# Patient Record
Sex: Male | Born: 1985
Health system: Southern US, Community
[De-identification: ages and names within clinical notes are randomized; demographics above are authoritative.]

## PROBLEM LIST (undated history)

## (undated) DIAGNOSIS — I10 Essential (primary) hypertension: Secondary | ICD-10-CM

## (undated) DIAGNOSIS — F319 Bipolar disorder, unspecified: Secondary | ICD-10-CM

## (undated) DIAGNOSIS — F209 Schizophrenia, unspecified: Secondary | ICD-10-CM

## (undated) DIAGNOSIS — E785 Hyperlipidemia, unspecified: Secondary | ICD-10-CM

## (undated) DIAGNOSIS — M549 Dorsalgia, unspecified: Secondary | ICD-10-CM

## (undated) HISTORY — DX: Hyperlipidemia, unspecified: E78.5

---

## 2007-12-30 ENCOUNTER — Inpatient Hospital Stay (HOSPITAL_COMMUNITY): Admission: EM | Admit: 2007-12-30 | Discharge: 2008-01-07 | Payer: Self-pay | Admitting: Psychiatry

## 2007-12-30 ENCOUNTER — Emergency Department (HOSPITAL_COMMUNITY): Admission: EM | Admit: 2007-12-30 | Discharge: 2007-12-30 | Payer: Self-pay | Admitting: Emergency Medicine

## 2007-12-31 ENCOUNTER — Ambulatory Visit: Payer: Self-pay | Admitting: Psychiatry

## 2009-05-16 ENCOUNTER — Other Ambulatory Visit: Payer: Self-pay | Admitting: Emergency Medicine

## 2009-05-16 ENCOUNTER — Ambulatory Visit: Payer: Self-pay | Admitting: Psychiatry

## 2009-05-16 ENCOUNTER — Inpatient Hospital Stay (HOSPITAL_COMMUNITY): Admission: RE | Admit: 2009-05-16 | Discharge: 2009-05-23 | Payer: Self-pay | Admitting: Psychiatry

## 2009-08-23 ENCOUNTER — Encounter: Admission: RE | Admit: 2009-08-23 | Discharge: 2009-10-02 | Payer: Self-pay | Admitting: Orthopaedic Surgery

## 2009-09-10 ENCOUNTER — Encounter: Admission: RE | Admit: 2009-09-10 | Discharge: 2009-10-02 | Payer: Self-pay | Admitting: *Deleted

## 2009-10-21 ENCOUNTER — Emergency Department (HOSPITAL_COMMUNITY): Admission: EM | Admit: 2009-10-21 | Discharge: 2009-10-21 | Payer: Self-pay | Admitting: Emergency Medicine

## 2010-05-28 ENCOUNTER — Emergency Department (HOSPITAL_COMMUNITY): Admission: EM | Admit: 2010-05-28 | Discharge: 2010-05-28 | Payer: Self-pay | Admitting: Emergency Medicine

## 2010-05-30 ENCOUNTER — Observation Stay (HOSPITAL_COMMUNITY): Admission: EM | Admit: 2010-05-30 | Discharge: 2010-06-04 | Payer: Self-pay | Admitting: Emergency Medicine

## 2010-05-31 ENCOUNTER — Ambulatory Visit: Payer: Self-pay | Admitting: Gastroenterology

## 2010-05-31 ENCOUNTER — Ambulatory Visit: Payer: Self-pay | Admitting: Hematology & Oncology

## 2010-05-31 DIAGNOSIS — F200 Paranoid schizophrenia: Secondary | ICD-10-CM

## 2010-06-03 ENCOUNTER — Encounter: Payer: Self-pay | Admitting: Internal Medicine

## 2010-06-03 ENCOUNTER — Ambulatory Visit: Payer: Self-pay | Admitting: Psychiatry

## 2010-06-04 ENCOUNTER — Inpatient Hospital Stay (HOSPITAL_COMMUNITY): Admission: EM | Admit: 2010-06-04 | Discharge: 2010-06-05 | Payer: Self-pay | Admitting: Psychiatry

## 2010-06-04 ENCOUNTER — Ambulatory Visit: Payer: Self-pay | Admitting: Psychiatry

## 2010-06-05 ENCOUNTER — Ambulatory Visit: Payer: Self-pay | Admitting: Psychiatry

## 2010-08-14 DIAGNOSIS — J309 Allergic rhinitis, unspecified: Secondary | ICD-10-CM | POA: Insufficient documentation

## 2010-09-17 DIAGNOSIS — F200 Paranoid schizophrenia: Secondary | ICD-10-CM | POA: Insufficient documentation

## 2011-01-21 NOTE — Procedures (Signed)
Summary: Upper Endoscopy  Patient: Lucas Knapp Note: All result statuses are Final unless otherwise noted.  Tests: (1) Upper Endoscopy (EGD)   EGD Upper Endoscopy       DONE     [facility]     [address] [place], [place]  [address]           ENDOSCOPY PROCEDURE REPORT           PATIENT:  Lucas Knapp, Lucas Knapp  MR#:  [id number]     BIRTHDATE:  1986-07-13, 23 yrs. old  GENDER:  male           ENDOSCOPIST:  Iva Boop, MD, [facility]     Referred by:  [facility],           PROCEDURE DATE:  06/03/2010     PROCEDURE:  EGD, diagnostic     ASA CLASS:  Class II     INDICATIONS:  iron deficiency anemia           MEDICATIONS:   Fentanyl 50 mcg IV, Versed 5 mg IV     TOPICAL ANESTHETIC:  Cetacaine Spray           DESCRIPTION OF PROCEDURE:   After the risks benefits and     alternatives of the procedure were thoroughly explained, informed     consent was obtained.  The  endoscope was introduced through the     mouth and advanced to the second portion of the duodenum, without     limitations.  The instrument was slowly withdrawn as the mucosa     was fully examined.     <<PROCEDUREIMAGES>>           The upper, middle, and distal third of the esophagus were     carefully inspected and no abnormalities were noted. The z-line     was well seen at the GEJ. The endoscope was pushed into the fundus     which was normal including a retroflexed view. The antrum,gastric     body, first and second part of the duodenum were unremarkable.     Retroflexed views revealed no abnormalities.    The scope was then     withdrawn from the patient and the procedure completed.           COMPLICATIONS:  None           ENDOSCOPIC IMPRESSION:     1) Normal EGD     RECOMMENDATIONS:     colonoscopy next           REPEAT EXAM:  In for as needed.           Iva Boop, MD, [facility]           n.     eSIGNED:   Iva Boop at 06/03/2010 12:56 PM           Ronnald Ramp,  [id number]  Note: An exclamation mark (!) indicates a result that was not dispersed into the flowsheet. Document Creation Date: 06/03/2010 1:08 PM _______________________________________________________________________  (1) Order result status: Final Collection or observation date-time: 06/03/2010 12:53 Requested date-time:  Receipt date-time:  Reported date-time:  Referring Physician:   Ordering Physician: Stan Head [phone]) Specimen Source:  Source: Launa Grill Order Number: [id number] Lab site:

## 2011-01-21 NOTE — Procedures (Signed)
Summary: Colonoscopy  Patient: [name] Note: All result statuses are Final unless otherwise noted.  Tests: (1) Colonoscopy (COL)   COL Colonoscopy           DONE     [facility]     [address] [place], [place]  [address]           COLONOSCOPY PROCEDURE REPORT           PATIENT:  Lucas Knapp, Lucas Knapp  MR#:  [id number]     BIRTHDATE:  03/27/1986, 23 yrs. old  GENDER:  male     ENDOSCOPIST:  Iva Boop, MD, [facility]     REF. BY:  [facility]     PROCEDURE DATE:  06/03/2010     PROCEDURE:  Colonoscopy 95[address]     ASA CLASS:  Class II     INDICATIONS:  Iron Deficiency Anemia     MEDICATIONS:   There was residual sedation effect present from     prior procedure., Versed 4 mg, Fentanyl 50 mcg IV           DESCRIPTION OF PROCEDURE:   After the risks benefits and     alternatives of the procedure were thoroughly explained, informed     consent was obtained.  Digital rectal exam was performed and     revealed prolasped hemorrhoids.   The  endoscope was introduced     through the anus and advanced to the terminal ileum which was     intubated for a short distance, without limitations.  The quality     of the prep was good, using MoviPrep.  The instrument was then     slowly withdrawn as the colon was fully examined. Insertion: 5     minutes Withdrwal: 6 minutes     <<PROCEDUREIMAGES>>           FINDINGS:  Internal hemorrhoids were found. Looks like chronically     inflamed and prolapsed internal hemorrhoid.  The terminal ileum     appeared normal.  A normal appearing cecum, ileocecal valve, and     appendiceal orifice were identified. The ascending, hepatic     flexure, transverse, splenic flexure, descending, and sigmoid     colon, appeared unremarkable.   Retroflexed views in the rectum     revealed internal hemorrhoids.    The scope was then withdrawn     from the patient and the procedure completed.           COMPLICATIONS:  None        ENDOSCOPIC IMPRESSION:     1) Prolapased internal hemorrhoid. May be cause of anemia     as it sounds like he has been having           chronic problems.     2) Otherwise normal colonoscopy  into terminal ileum. Good     prep.           RECOMMENDATIONS:     [facility] consult re: hemorrhoid     therapy (called)           REPEAT EXAM:  In for as needed.           Iva Boop, MD, [facility]           n.     eSIGNED:   Iva Boop at 06/03/2010 01:24 PM           Ronnald Ramp, [id number]  Note: An exclamation  mark (!) indicates a result that was not dispersed into the flowsheet. Document Creation Date: 06/03/2010 1:26 PM _______________________________________________________________________  (1) Order result status: Final Collection or observation date-time: 06/03/2010 13:15 Requested date-time:  Receipt date-time:  Reported date-time:  Referring Physician:   Ordering Physician: Stan Head [phone]) Specimen Source:  Source: Launa Grill Order Number: [id number] Lab site:

## 2011-03-10 LAB — CBC
HCT: 22.4 % — ABNORMAL LOW (ref 39.0–52.0)
HCT: 25 % — ABNORMAL LOW (ref 39.0–52.0)
Hemoglobin: 6.8 g/dL — CL (ref 13.0–17.0)
Hemoglobin: 7 g/dL — ABNORMAL LOW (ref 13.0–17.0)
Hemoglobin: 7.7 g/dL — ABNORMAL LOW (ref 13.0–17.0)
Hemoglobin: 8.2 g/dL — ABNORMAL LOW (ref 13.0–17.0)
MCHC: 30.6 g/dL (ref 30.0–36.0)
MCHC: 30.8 g/dL (ref 30.0–36.0)
MCHC: 31.2 g/dL (ref 30.0–36.0)
MCV: 70.8 fL — ABNORMAL LOW (ref 78.0–100.0)
MCV: 71.1 fL — ABNORMAL LOW (ref 78.0–100.0)
MCV: 71.5 fL — ABNORMAL LOW (ref 78.0–100.0)
MCV: 72.3 fL — ABNORMAL LOW (ref 78.0–100.0)
Platelets: 401 10*3/uL — ABNORMAL HIGH (ref 150–400)
RBC: 3.15 MIL/uL — ABNORMAL LOW (ref 4.22–5.81)
RBC: 3.17 MIL/uL — ABNORMAL LOW (ref 4.22–5.81)
RBC: 3.25 MIL/uL — ABNORMAL LOW (ref 4.22–5.81)
RBC: 3.47 MIL/uL — ABNORMAL LOW (ref 4.22–5.81)
RBC: 3.52 MIL/uL — ABNORMAL LOW (ref 4.22–5.81)
RBC: 3.79 MIL/uL — ABNORMAL LOW (ref 4.22–5.81)
RDW: 19.2 % — ABNORMAL HIGH (ref 11.5–15.5)
RDW: 19.4 % — ABNORMAL HIGH (ref 11.5–15.5)
RDW: 20.2 % — ABNORMAL HIGH (ref 11.5–15.5)
RDW: 20.3 % — ABNORMAL HIGH (ref 11.5–15.5)
WBC: 5.8 10*3/uL (ref 4.0–10.5)
WBC: 6.4 10*3/uL (ref 4.0–10.5)

## 2011-03-10 LAB — FOLATE

## 2011-03-10 LAB — URINALYSIS, ROUTINE W REFLEX MICROSCOPIC
Glucose, UA: NEGATIVE mg/dL
Leukocytes, UA: NEGATIVE
Protein, ur: 100 mg/dL — AB
Specific Gravity, Urine: 1.01 (ref 1.005–1.030)
pH: 7 (ref 5.0–8.0)

## 2011-03-10 LAB — COMPREHENSIVE METABOLIC PANEL
BUN: 8 mg/dL (ref 6–23)
BUN: 8 mg/dL (ref 6–23)
CO2: 23 mEq/L (ref 19–32)
Calcium: 9.6 mg/dL (ref 8.4–10.5)
Chloride: 109 mEq/L (ref 96–112)
Creatinine, Ser: 0.94 mg/dL (ref 0.4–1.5)
GFR calc non Af Amer: >60 mL/min (ref >60)
Glucose, Bld: 78 mg/dL (ref 70–99)
Glucose, Bld: 88 mg/dL (ref 70–99)
Sodium: 139 mEq/L (ref 135–145)
Total Bilirubin: 0.5 mg/dL (ref 0.3–1.2)
Total Protein: 7.8 g/dL (ref 6.0–8.3)

## 2011-03-10 LAB — UIFE/LIGHT CHAINS/TP QN, 24-HR UR
Alpha 2, Urine: DETECTED — AB
Total Protein, Urine: 47.3 mg/dL

## 2011-03-10 LAB — RAPID URINE DRUG SCREEN, HOSP PERFORMED
Cocaine: NOT DETECTED
Opiates: NOT DETECTED

## 2011-03-10 LAB — BASIC METABOLIC PANEL
BUN: 3 mg/dL — ABNORMAL LOW (ref 6–23)
Calcium: 9 mg/dL (ref 8.4–10.5)
GFR calc non Af Amer: >60 mL/min (ref >60)
Potassium: 4.5 mEq/L (ref 3.5–5.1)
Sodium: 142 mEq/L (ref 135–145)

## 2011-03-10 LAB — IRON AND TIBC

## 2011-03-10 LAB — TSH: TSH: 3.069 u[IU]/mL (ref 0.350–4.500)

## 2011-03-10 LAB — HEMOGLOBINOPATHY EVALUATION
Hgb F Quant: 0 % (ref 0.0–2.0)
Hgb S Quant: 0 % (ref 0.0–0.0)

## 2011-03-10 LAB — TECHNOLOGIST SMEAR REVIEW

## 2011-03-10 LAB — DIFFERENTIAL
Lymphocytes Relative: 34 % (ref 12–46)
Lymphs Abs: 2.6 10*3/uL (ref 0.7–4.0)
Neutrophils Relative %: 57 % (ref 43–77)

## 2011-03-10 LAB — LACTATE DEHYDROGENASE: LDH: 141 U/L (ref 94–250)

## 2011-03-10 LAB — URINE MICROSCOPIC-ADD ON

## 2011-03-10 LAB — PROTEIN ELECTROPHORESIS, SERUM
Albumin ELP: 62 % (ref 55.8–66.1)
Alpha-1-Globulin: 5.2 % — ABNORMAL HIGH (ref 2.9–4.9)
Beta 2: 5.7 % (ref 3.2–6.5)
Total Protein ELP: 7.5 g/dL (ref 6.0–8.3)

## 2011-04-01 LAB — BASIC METABOLIC PANEL
BUN: 10 mg/dL (ref 6–23)
Chloride: 107 mEq/L (ref 96–112)
Creatinine, Ser: 0.9 mg/dL (ref 0.4–1.5)
GFR calc Af Amer: >60 mL/min (ref >60)
GFR calc non Af Amer: >60 mL/min (ref >60)
Potassium: 4.1 mEq/L (ref 3.5–5.1)

## 2011-04-01 LAB — CBC
Platelets: 300 10*3/uL (ref 150–400)
RBC: 5.2 MIL/uL (ref 4.22–5.81)
WBC: 4.8 10*3/uL (ref 4.0–10.5)

## 2011-04-01 LAB — DIFFERENTIAL
Lymphocytes Relative: 41 % (ref 12–46)
Lymphs Abs: 2 10*3/uL (ref 0.7–4.0)
Monocytes Relative: 8 % (ref 3–12)
Neutrophils Relative %: 49 % (ref 43–77)

## 2011-04-01 LAB — ETHANOL: Alcohol, Ethyl (B): 98 mg/dL — ABNORMAL HIGH (ref 0–10)

## 2011-04-01 LAB — RAPID URINE DRUG SCREEN, HOSP PERFORMED
Amphetamines: NOT DETECTED
Barbiturates: NOT DETECTED
Benzodiazepines: NOT DETECTED
Cocaine: NOT DETECTED
Opiates: NOT DETECTED
Tetrahydrocannabinol: NOT DETECTED

## 2011-04-29 ENCOUNTER — Emergency Department (HOSPITAL_COMMUNITY)
Admission: EM | Admit: 2011-04-29 | Discharge: 2011-05-02 | Disposition: A | Discharge status: Other Acute Inpatient Hospital | Payer: Medicare Other | Source: Home / Self Care | Attending: Emergency Medicine | Admitting: Emergency Medicine

## 2011-04-29 ENCOUNTER — Ambulatory Visit (HOSPITAL_COMMUNITY)
Admission: RE | Admit: 2011-04-29 | Discharge: 2011-04-29 | Disposition: A | Discharge status: Home / Self Care | Payer: Medicare Other | Attending: Psychiatry | Admitting: Psychiatry

## 2011-04-29 DIAGNOSIS — R443 Hallucinations, unspecified: Secondary | ICD-10-CM | POA: Insufficient documentation

## 2011-04-29 DIAGNOSIS — F411 Generalized anxiety disorder: Secondary | ICD-10-CM | POA: Insufficient documentation

## 2011-04-29 DIAGNOSIS — F29 Unspecified psychosis not due to a substance or known physiological condition: Secondary | ICD-10-CM | POA: Insufficient documentation

## 2011-04-29 DIAGNOSIS — M549 Dorsalgia, unspecified: Secondary | ICD-10-CM | POA: Insufficient documentation

## 2011-04-29 DIAGNOSIS — F319 Bipolar disorder, unspecified: Secondary | ICD-10-CM | POA: Insufficient documentation

## 2011-04-29 LAB — DIFFERENTIAL
Basophils Absolute: 0.1 10*3/uL (ref 0.0–0.1)
Basophils Relative: 1 % (ref 0–1)
Eosinophils Absolute: 0 10*3/uL (ref 0.0–0.7)
Eosinophils Relative: 1 % (ref 0–5)
Monocytes Absolute: 0.7 10*3/uL (ref 0.1–1.0)

## 2011-04-29 LAB — URINALYSIS, ROUTINE W REFLEX MICROSCOPIC
Bilirubin Urine: NEGATIVE
Glucose, UA: NEGATIVE mg/dL
Leukocytes, UA: NEGATIVE
Nitrite: NEGATIVE
Specific Gravity, Urine: 1.027 (ref 1.005–1.030)
pH: 6 (ref 5.0–8.0)

## 2011-04-29 LAB — BASIC METABOLIC PANEL
Chloride: 98 mEq/L (ref 96–112)
GFR calc non Af Amer: >60 mL/min (ref >60)
Glucose, Bld: 101 mg/dL — ABNORMAL HIGH (ref 70–99)
Potassium: 4 mEq/L (ref 3.5–5.1)
Sodium: 135 mEq/L (ref 135–145)

## 2011-04-29 LAB — URINE MICROSCOPIC-ADD ON

## 2011-04-29 LAB — CBC
HCT: 50.2 % (ref 39.0–52.0)
MCHC: 36.3 g/dL — ABNORMAL HIGH (ref 30.0–36.0)
Platelets: 325 10*3/uL (ref 150–400)
RDW: 12 % (ref 11.5–15.5)
WBC: 6.5 10*3/uL (ref 4.0–10.5)

## 2011-04-29 LAB — RAPID URINE DRUG SCREEN, HOSP PERFORMED
Barbiturates: NOT DETECTED
Opiates: NOT DETECTED
Tetrahydrocannabinol: NOT DETECTED

## 2011-04-30 DIAGNOSIS — F29 Unspecified psychosis not due to a substance or known physiological condition: Secondary | ICD-10-CM

## 2011-05-01 DIAGNOSIS — F29 Unspecified psychosis not due to a substance or known physiological condition: Secondary | ICD-10-CM

## 2011-05-02 ENCOUNTER — Inpatient Hospital Stay (HOSPITAL_COMMUNITY)
Admission: AD | Admit: 2011-05-02 | Discharge: 2011-05-09 | DRG: 885 | Disposition: A | Discharge status: Home / Self Care | Payer: Medicare Other | Source: Ambulatory Visit | Attending: Psychiatry | Admitting: Psychiatry

## 2011-05-02 DIAGNOSIS — F259 Schizoaffective disorder, unspecified: Principal | ICD-10-CM | POA: Diagnosis present

## 2011-05-02 DIAGNOSIS — Z91199 Patient's noncompliance with other medical treatment and regimen due to unspecified reason: Secondary | ICD-10-CM

## 2011-05-02 DIAGNOSIS — F200 Paranoid schizophrenia: Secondary | ICD-10-CM | POA: Diagnosis present

## 2011-05-02 DIAGNOSIS — Z9119 Patient's noncompliance with other medical treatment and regimen: Secondary | ICD-10-CM

## 2011-05-03 DIAGNOSIS — F209 Schizophrenia, unspecified: Secondary | ICD-10-CM
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
## 2011-05-06 NOTE — H&P (Signed)
NAME:  Lucas Knapp, Lucas Knapp NO.:  [id number]   MEDICAL RECORD NO.:  [id number]          PATIENT TYPE:  IPS   LOCATION:  0406                          FACILITY:  [facility]   PHYSICIAN:  Anselm Jungling, MD  DATE OF BIRTH:  07-Feb-1986   DATE OF ADMISSION:  12/30/2007  DATE OF DISCHARGE:                       PSYCHIATRIC ADMISSION ASSESSMENT   This is a 25 year old male that is involuntary committed December 30, 2007.  The patient is here on petition.  Papers state the patient has  not been taking his medications, goes into rages feels that people are  after him, that his phones are tapped and has been making threats to his  family.  Per the chart, patient also was fired from his job yesterday  because he was going into the women's bathroom and exhibiting other  strange behaviors.  He apparently was petitioned per his brother.   PAST PSYCHIATRIC HISTORY:  First admission to [facility],  is unclear if he is outpatient with the mental health Wilhemina Grall.   SOCIAL HISTORY:  He is a 25 year old male. His living arrangements are  unclear.  Again he was fired from his job yesterday.   FAMILY HISTORY:  Is unknown.   ALCOHOL DRUG HISTORY:  The patient denies any alcohol or drug use  although urine drug screen did show positive for marijuana   MEDICAL HISTORY:  Primary care Tamim Skog is unknown.  Medical problems:  No apparent health issues.   LABORATORY DATA:  Urine is urinalysis is negative.  Urine drug screen is  positive for benzodiazepines, positive for THC.  Alcohol level less than  five.  CBC is within normal limits.   MEDICATIONS:  Has been on naproxen and Klonopin.   DRUG ALLERGIES:  No known allergies.   PHYSICAL EXAM:  The patient was fully assessed at [facility] emergency  department.  He did receive some Ativan. His temperature is 98.9, 82  heart rate, 20 respirations, blood pressure is 110/87.  This is a  muscular young male with good eye contact,  cooperative.   MENTAL STATUS EXAM:  He is fully alert, cooperative, again he is a  muscular, well-nourished male, casually dressed, good eye contact.  His  speech is tangential.  Thought process are somewhat disorganized,  needing redirection, initially got on the unit.  He seems to be aware of  himself and situation and his judgment is poor.  Insight is poor.  He is  a poor historian.   AXIS I:  Psychosis NOS.  THC abuse.  AXIS II:  Deferred.  AXIS III:  No known medical problems.  AXIS IV:  Problems with occupation and other psychosocial problems.  AXIS V:  Current is 25-30   PLAN:  Contract for safety.  Stabilize mood and thinking.  We will have  Risperdal for psychotic symptoms, mood stabilization and we will also  address his substance use.  We will continue to obtain more history,  contact his family for background behaviors.  His tentative length of  stay at this time as of 4-6 days.      Landry Corporal, N.P.  Anselm Jungling, MD  Electronically Signed    JO/MEDQ  D:  12/31/2007  T:  01/01/2008  Job:  [id number]
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
## 2011-05-13 NOTE — Discharge Summary (Signed)
Lucas Knapp, Lucas Knapp            ACCOUNT NO.:  [id number]  MEDICAL RECORD NO.:  [id number]           PATIENT TYPE:  I  LOCATION:  0400                          FACILITY:  [facility]  PHYSICIAN:  Eulogio Ditch, MD DATE OF BIRTH:  [birth year]  DATE OF ADMISSION:  05/02/2011 DATE OF DISCHARGE:  05/09/2011                              DISCHARGE SUMMARY   IDENTIFYING INFORMATION:  This is a 25 year old male.  This is an involuntary admission.  HISTORY OF PRESENT ILLNESS:  Lucas Knapp presented by way of our emergency room complaining of difficulty with sleeping and eating.  Lucas Knapp was having an exacerbation of his schizoaffective disorder and was requesting a safe place, where Lucas Knapp could get away from the evil spirits and various religious spirits that Lucas Knapp felt were impacting him.  Lucas Knapp complained of auditory hallucinations with voices telling him to come to the hospital. Lucas Knapp lives with his twin brother, who had reported that Lucas Knapp had been off medications for at least 2 months and was becoming increasingly religiously preoccupied and agitated.  MEDICAL EVALUATION AND DIAGNOSTIC STUDIES:  This is a fully alert, normally-developed African American male.  No abnormal movements. Smooth motor.  PHYSICAL EXAMINATION:  Admitting vital signs:  Temperature 98.1, pulse 76, respirations 18, blood pressure 118/80 and a pulse oximetry of 96. The full physical exam was done at [facility] emergency room and is noted in the record.  His urine drug screen was noted to be negative for all substances. Alcohol screen negative.  Chemistry normal.  BUN 10, creatinine 1.07. Routine urinalysis remarkable for greater than 300 mg/dL of protein and ketones 15 mg.  No cells, nitrites or urobilinogen.  CBC with hemoglobin 18.2, normal white count, hematocrit 50.2 and platelets 325,000.  Lucas Knapp was stabilized in the emergency room with Zyprexa 10 mg orally, Geodon 20 mg IM and Ativan 1 mg p.o.  COURSE OF HOSPITALIZATION:  Lucas Knapp  was admitted to our acute stabilization and intensive care unit and gradually assimilated into the milieu.  Lucas Knapp was initially started on Zyprexa 10 mg p.o. nightly, which had been started in the emergency room.  Lucas Knapp was previously and Saphris 5 mg nightly apparently.  Lucas Knapp was given a provisional diagnosis of schizoaffective disorder and reported a previous history of being seen at [facility] for a diagnosis of bipolar disorder, NOS.  Lucas Knapp lives with two of his brothers.  Lucas Knapp has an identical twin brother with whom Lucas Knapp lives.  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  Lucas Knapp was cooperative in allowing [place] to speak to his family.  Lucas Knapp was receptive to suicide prevention information and his participation in group therapy was satisfactory throughout his stay.  Lucas Knapp also was appropriate throughout his stay with peers and staff.  By the 14th, Lucas Knapp was still displaying some disorganized thinking, labile affect and paranoid ideas.  His family gave feedback that when Lucas Knapp is taking his medications Lucas Knapp usually does well.  Parents had suggested that Zyprexa was one of his regular medications, so we elected to continue this.  We continued to work with the Zyprexa and gradually increased it 20 mg p.o. nightly.  Gradually, Lucas Knapp calmed down, was more cooperative and less suspicious.  Lucas Knapp continued to display poor insight into his illness and continued to take issue with the concept that Lucas Knapp had, had any kind of psychosis.  Lucas Knapp admitted to a history of mood disorder.  Lucas Knapp was cooperative and participation in group continued to increase.  Lucas Knapp regularly expressed the thoughts that Lucas Knapp had come to [facility] for a rest and a vacation.  Lucas Knapp was in agreement for follow up with an ACT team.  By the 18th, Lucas Knapp was in contact with reality, calm, cooperative with adequate insight, voiced his intention to continue on medications and follow up with the outpatient plans.  His thoughts were  mildly hyperreligious and Lucas Knapp had a religious focus, but was much more balanced and much more aware of social appropriateness.  No agitation and interacting appropriately with peers and staff.  DISCHARGE DIAGNOSES:  Axis I:  Rule out schizoaffective disorder versus schizophrenia, paranoid type. Axis II:  Deferred. Axis III:  No diagnosis. Axis IV:  Deferred. Axis V:  Global Assessment of Functioning current 53, past year not known.  DISCHARGE PLAN:  To follow up with [facility] on May 13, 2011, at 12 noon.  DISCHARGE CONDITION:  Stable.  DISCHARGE MEDICATIONS: 1. Diphenhydramine 50 mg nightly. 2. Olanzapine 20 mg nightly. 3. Multivitamin daily. 4. Vitamin B over-the-counter.  Lucas Knapp may resume as Lucas Knapp was previously     taking. 5. Lucas Knapp was instructed to stop taking Saphris.     Margaret A. Lorin Picket, N.P.   ______________________________ Eulogio Ditch, MD    MAS/MEDQ  D:  05/12/2011  T:  05/12/2011  Job:  [id number]  Electronically Signed by Kari Baars N.P. on 05/13/2011 11:29:01 AM Electronically Signed by Eulogio Ditch  on 05/13/2011 07:27:23 PM

## 2011-05-31 ENCOUNTER — Emergency Department (HOSPITAL_COMMUNITY)
Admission: EM | Admit: 2011-05-31 | Discharge: 2011-05-31 | Disposition: A | Discharge status: Home / Self Care | Payer: Medicare Other | Attending: Emergency Medicine | Admitting: Emergency Medicine

## 2011-05-31 DIAGNOSIS — R52 Pain, unspecified: Secondary | ICD-10-CM | POA: Insufficient documentation

## 2011-05-31 DIAGNOSIS — F319 Bipolar disorder, unspecified: Secondary | ICD-10-CM | POA: Insufficient documentation

## 2011-07-16 ENCOUNTER — Other Ambulatory Visit: Payer: Self-pay | Admitting: Orthopaedic Surgery

## 2011-07-16 ENCOUNTER — Ambulatory Visit: Payer: Medicare Other | Attending: Family Medicine | Admitting: Physical Therapy

## 2011-07-16 DIAGNOSIS — R293 Abnormal posture: Secondary | ICD-10-CM | POA: Insufficient documentation

## 2011-07-16 DIAGNOSIS — M545 Low back pain, unspecified: Secondary | ICD-10-CM | POA: Insufficient documentation

## 2011-07-16 DIAGNOSIS — M79605 Pain in left leg: Secondary | ICD-10-CM

## 2011-07-16 DIAGNOSIS — IMO0001 Reserved for inherently not codable concepts without codable children: Secondary | ICD-10-CM | POA: Insufficient documentation

## 2011-07-17 ENCOUNTER — Ambulatory Visit
Admission: RE | Admit: 2011-07-17 | Discharge: 2011-07-17 | Disposition: A | Discharge status: Home / Self Care | Payer: Medicare Other | Source: Ambulatory Visit | Attending: Orthopaedic Surgery | Admitting: Orthopaedic Surgery

## 2011-07-17 DIAGNOSIS — M545 Low back pain: Secondary | ICD-10-CM

## 2011-07-17 DIAGNOSIS — M79605 Pain in left leg: Secondary | ICD-10-CM

## 2011-07-18 ENCOUNTER — Encounter: Payer: Medicare Other | Admitting: Physical Therapy

## 2011-07-21 ENCOUNTER — Ambulatory Visit: Payer: Medicare Other | Admitting: Physical Therapy

## 2011-07-29 ENCOUNTER — Ambulatory Visit: Payer: Medicare Other | Attending: Family Medicine | Admitting: Physical Therapy

## 2011-07-29 DIAGNOSIS — IMO0001 Reserved for inherently not codable concepts without codable children: Secondary | ICD-10-CM | POA: Insufficient documentation

## 2011-07-29 DIAGNOSIS — M545 Low back pain, unspecified: Secondary | ICD-10-CM | POA: Insufficient documentation

## 2011-07-29 DIAGNOSIS — R293 Abnormal posture: Secondary | ICD-10-CM | POA: Insufficient documentation

## 2011-07-31 ENCOUNTER — Ambulatory Visit: Payer: Medicare Other | Admitting: Physical Therapy

## 2011-08-04 ENCOUNTER — Ambulatory Visit: Payer: Medicare Other | Admitting: Physical Therapy

## 2011-08-06 ENCOUNTER — Ambulatory Visit: Payer: Medicare Other | Admitting: Physical Therapy

## 2011-08-12 ENCOUNTER — Ambulatory Visit: Payer: Medicare Other | Admitting: Physical Therapy

## 2011-08-14 ENCOUNTER — Ambulatory Visit: Payer: Medicare Other | Admitting: Physical Therapy

## 2011-08-18 ENCOUNTER — Ambulatory Visit: Payer: Medicare Other

## 2011-08-19 ENCOUNTER — Ambulatory Visit: Payer: Medicare Other

## 2011-08-28 ENCOUNTER — Ambulatory Visit: Payer: Medicare Other | Attending: Family Medicine | Admitting: Physical Therapy

## 2011-08-28 DIAGNOSIS — M545 Low back pain, unspecified: Secondary | ICD-10-CM | POA: Insufficient documentation

## 2011-08-28 DIAGNOSIS — IMO0001 Reserved for inherently not codable concepts without codable children: Secondary | ICD-10-CM | POA: Insufficient documentation

## 2011-08-28 DIAGNOSIS — R293 Abnormal posture: Secondary | ICD-10-CM | POA: Insufficient documentation

## 2011-09-02 ENCOUNTER — Encounter: Payer: Medicare Other | Admitting: Physical Therapy

## 2011-09-04 ENCOUNTER — Ambulatory Visit: Payer: Medicare Other | Admitting: Physical Therapy

## 2011-09-09 ENCOUNTER — Encounter: Payer: Medicare Other | Admitting: Physical Therapy

## 2011-09-11 ENCOUNTER — Encounter: Payer: Medicare Other | Admitting: Physical Therapy

## 2011-09-11 LAB — URINALYSIS, ROUTINE W REFLEX MICROSCOPIC
Glucose, UA: NEGATIVE
pH: 6

## 2011-09-11 LAB — ETHANOL: Alcohol, Ethyl (B): <5

## 2011-09-11 LAB — CBC
HCT: 43.2
Hemoglobin: 15.2
Platelets: 266
WBC: 6.1

## 2011-09-11 LAB — BASIC METABOLIC PANEL
GFR calc Af Amer: >60
GFR calc non Af Amer: >60
Potassium: 3.9
Sodium: 137

## 2011-09-11 LAB — DIFFERENTIAL
Eosinophils Relative: 3
Lymphocytes Relative: 53 — ABNORMAL HIGH
Lymphs Abs: 3.2
Monocytes Absolute: 0.4

## 2011-12-26 ENCOUNTER — Encounter (INDEPENDENT_AMBULATORY_CARE_PROVIDER_SITE_OTHER): Payer: Self-pay | Admitting: Ophthalmology

## 2011-12-26 ENCOUNTER — Ambulatory Visit (INDEPENDENT_AMBULATORY_CARE_PROVIDER_SITE_OTHER): Payer: Medicare Other | Admitting: Ophthalmology

## 2011-12-26 DIAGNOSIS — H5210 Myopia, unspecified eye: Secondary | ICD-10-CM

## 2011-12-26 DIAGNOSIS — H33309 Unspecified retinal break, unspecified eye: Secondary | ICD-10-CM

## 2011-12-26 DIAGNOSIS — H43819 Vitreous degeneration, unspecified eye: Secondary | ICD-10-CM

## 2012-06-08 DIAGNOSIS — F3110 Bipolar disorder, current episode manic without psychotic features, unspecified: Secondary | ICD-10-CM

## 2012-06-08 DIAGNOSIS — F250 Schizoaffective disorder, bipolar type: Principal | ICD-10-CM

## 2012-06-08 DIAGNOSIS — I10 Essential (primary) hypertension: Secondary | ICD-10-CM | POA: Insufficient documentation

## 2012-06-08 DIAGNOSIS — M62830 Muscle spasm of back: Secondary | ICD-10-CM | POA: Diagnosis present

## 2012-06-08 DIAGNOSIS — R519 Headache, unspecified: Secondary | ICD-10-CM

## 2012-06-08 DIAGNOSIS — T148XXA Other injury of unspecified body region, initial encounter: Secondary | ICD-10-CM

## 2012-06-08 HISTORY — DX: Other injury of unspecified body region, initial encounter: T14.8XXA

## 2012-06-08 HISTORY — DX: Headache, unspecified: R51.9

## 2012-06-08 HISTORY — DX: Schizoaffective disorder, bipolar type: F25.0

## 2012-06-08 HISTORY — DX: Bipolar disorder, current episode manic without psychotic features, unspecified: F31.10

## 2012-08-24 ENCOUNTER — Ambulatory Visit: Payer: Medicare Other | Attending: Family Medicine | Admitting: Physical Therapy

## 2012-08-24 DIAGNOSIS — M545 Low back pain, unspecified: Secondary | ICD-10-CM | POA: Insufficient documentation

## 2012-08-24 DIAGNOSIS — M542 Cervicalgia: Secondary | ICD-10-CM | POA: Insufficient documentation

## 2012-08-24 DIAGNOSIS — M2560 Stiffness of unspecified joint, not elsewhere classified: Secondary | ICD-10-CM | POA: Insufficient documentation

## 2012-08-24 DIAGNOSIS — IMO0001 Reserved for inherently not codable concepts without codable children: Secondary | ICD-10-CM | POA: Insufficient documentation

## 2012-08-25 ENCOUNTER — Ambulatory Visit: Payer: Medicare Other | Admitting: Physical Therapy

## 2012-08-30 ENCOUNTER — Ambulatory Visit: Payer: Medicare Other | Admitting: Physical Therapy

## 2012-09-01 ENCOUNTER — Ambulatory Visit: Payer: Medicare Other | Admitting: Physical Therapy

## 2012-09-03 ENCOUNTER — Ambulatory Visit: Payer: Medicare Other | Admitting: Physical Therapy

## 2012-09-06 ENCOUNTER — Ambulatory Visit: Payer: Medicare Other | Admitting: Physical Therapy

## 2012-09-08 ENCOUNTER — Ambulatory Visit: Payer: Medicare Other | Admitting: Physical Therapy

## 2012-09-10 ENCOUNTER — Ambulatory Visit: Payer: Medicare Other | Admitting: Physical Therapy

## 2012-09-13 ENCOUNTER — Ambulatory Visit: Payer: Medicare Other | Admitting: Physical Therapy

## 2012-09-15 ENCOUNTER — Ambulatory Visit: Payer: Medicare Other | Admitting: Physical Therapy

## 2012-09-15 ENCOUNTER — Emergency Department (HOSPITAL_COMMUNITY)
Admission: EM | Admit: 2012-09-15 | Discharge: 2012-09-17 | Disposition: A | Discharge status: Other Acute Inpatient Hospital | Payer: Medicare Other | Source: Home / Self Care | Attending: Emergency Medicine | Admitting: Emergency Medicine

## 2012-09-15 ENCOUNTER — Encounter (HOSPITAL_COMMUNITY): Payer: Self-pay | Admitting: *Deleted

## 2012-09-15 DIAGNOSIS — I10 Essential (primary) hypertension: Secondary | ICD-10-CM | POA: Diagnosis present

## 2012-09-15 DIAGNOSIS — Z79899 Other long term (current) drug therapy: Secondary | ICD-10-CM

## 2012-09-15 DIAGNOSIS — Z888 Allergy status to other drugs, medicaments and biological substances status: Secondary | ICD-10-CM

## 2012-09-15 DIAGNOSIS — Z9119 Patient's noncompliance with other medical treatment and regimen: Secondary | ICD-10-CM

## 2012-09-15 DIAGNOSIS — F17200 Nicotine dependence, unspecified, uncomplicated: Secondary | ICD-10-CM | POA: Diagnosis present

## 2012-09-15 DIAGNOSIS — F200 Paranoid schizophrenia: Principal | ICD-10-CM | POA: Diagnosis present

## 2012-09-15 DIAGNOSIS — Z91199 Patient's noncompliance with other medical treatment and regimen due to unspecified reason: Secondary | ICD-10-CM

## 2012-09-15 DIAGNOSIS — F29 Unspecified psychosis not due to a substance or known physiological condition: Secondary | ICD-10-CM

## 2012-09-15 DIAGNOSIS — Z560 Unemployment, unspecified: Secondary | ICD-10-CM

## 2012-09-15 DIAGNOSIS — M549 Dorsalgia, unspecified: Secondary | ICD-10-CM | POA: Diagnosis present

## 2012-09-15 DIAGNOSIS — F1210 Cannabis abuse, uncomplicated: Secondary | ICD-10-CM | POA: Diagnosis present

## 2012-09-15 HISTORY — DX: Dorsalgia, unspecified: M54.9

## 2012-09-15 LAB — RAPID URINE DRUG SCREEN, HOSP PERFORMED
Cocaine: NOT DETECTED
Opiates: NOT DETECTED

## 2012-09-15 MED ORDER — ZIPRASIDONE MESYLATE 20 MG IM SOLR
INTRAMUSCULAR | Status: AC
  Administered 2012-09-15: 20 mg
  Filled 2012-09-15: qty 20

## 2012-09-15 NOTE — ED Notes (Addendum)
Per EMS report: EMS found pt w/ [employer]. Family had called [employer] because family was concerned that pt has not been compliant with his psych medications.  Family had intentions to take pt to [facility], a psych facility, but pt c/o of leg pain. EMS was called for leg pain.  Enroute pt c/o of leg pain and testicle pain but upon re-evaluation of pain, pt changed pain site. When EMS asked pt about medical hx, pt only replied with "electronic configuration."  EMS note pt talking to people who are not there. Pt denies SI,HI. Pt does endorse use of marijuana but not ETOH or other drugs. Pt said he is taking his medications as prescribed but EMS didn't note large amounts missing.  EMS reports that pt appears to be noncompliant with medications. BP: 130/90, HR:98. RR: 18

## 2012-09-15 NOTE — ED Notes (Signed)
Pt placed in blue paper scrubs, waned by security. Pt belonging searched by security and placed in pt belonging bags and placed at the nurses station.  Pt has 3 pt belonging bags.

## 2012-09-15 NOTE — ED Notes (Signed)
Pt noted in the hallway stating, "I need a male nurse tech.  I need some privacy.  I'm in the bathroom trying to pee and this fag is not giving me privacy."  Pt was speaking loudly and exposed himself for a brief moment.  Able to get pt into the restroom and urine was collected.

## 2012-09-15 NOTE — ED Notes (Signed)
EXB:[id number]<LK> Expected date:<BR> Expected time:<BR> Means of arrival:<BR> Comments:<BR> EMS/with [employer]-needs medical clearance

## 2012-09-15 NOTE — ED Notes (Signed)
Pt currently shouting ideas that are non-sequitor.  Pt pacing in the room.
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
## 2012-09-16 ENCOUNTER — Ambulatory Visit (HOSPITAL_COMMUNITY)
Admission: EM | Admit: 2012-09-16 | Discharge: 2012-09-16 | Disposition: A | Discharge status: Home / Self Care | Payer: Medicare Other | Source: Ambulatory Visit | Attending: Psychiatry | Admitting: Psychiatry

## 2012-09-16 ENCOUNTER — Encounter (HOSPITAL_COMMUNITY): Payer: Self-pay | Admitting: Emergency Medicine

## 2012-09-16 LAB — COMPREHENSIVE METABOLIC PANEL
AST: 60 U/L — ABNORMAL HIGH (ref 0–37)
BUN: 15 mg/dL (ref 6–23)
CO2: 19 mEq/L (ref 19–32)
Chloride: 99 mEq/L (ref 96–112)
Creatinine, Ser: 1.65 mg/dL — ABNORMAL HIGH (ref 0.50–1.35)
GFR calc Af Amer: 64 mL/min — ABNORMAL LOW (ref >90)
GFR calc non Af Amer: 56 mL/min — ABNORMAL LOW (ref >90)
Glucose, Bld: 86 mg/dL (ref 70–99)
Total Bilirubin: 1.2 mg/dL (ref 0.3–1.2)

## 2012-09-16 LAB — ACETAMINOPHEN LEVEL: Acetaminophen (Tylenol), Serum: <15 ug/mL (ref 10–30)

## 2012-09-16 LAB — CBC
HCT: 49 % (ref 39.0–52.0)
Hemoglobin: 18.1 g/dL — ABNORMAL HIGH (ref 13.0–17.0)
MCV: 86.1 fL (ref 78.0–100.0)
Platelets: 259 10*3/uL (ref 150–400)
RBC: 5.69 MIL/uL (ref 4.22–5.81)
WBC: 10.3 10*3/uL (ref 4.0–10.5)

## 2012-09-16 MED ORDER — IBUPROFEN 800 MG PO TABS
800.0000 mg | ORAL_TABLET | Freq: Once | ORAL | Status: AC
  Administered 2012-09-16: 800 mg via ORAL
  Filled 2012-09-16: qty 1

## 2012-09-16 MED ORDER — HYDROCHLOROTHIAZIDE 12.5 MG PO CAPS
12.5000 mg | ORAL_CAPSULE | Freq: Every day | ORAL | Status: DC
  Administered 2012-09-16: 12.5 mg via ORAL
  Filled 2012-09-16: qty 1

## 2012-09-16 MED ORDER — LORAZEPAM 1 MG PO TABS
ORAL_TABLET | ORAL | Status: AC
  Filled 2012-09-16: qty 1

## 2012-09-16 MED ORDER — OLANZAPINE 5 MG PO TABS
5.0000 mg | ORAL_TABLET | Freq: Every day | ORAL | Status: DC
  Administered 2012-09-16 (×2): 5 mg via ORAL
  Filled 2012-09-16 (×2): qty 1

## 2012-09-16 MED ORDER — IBUPROFEN 600 MG PO TABS
600.0000 mg | ORAL_TABLET | Freq: Four times a day (QID) | ORAL | Status: DC | PRN
  Administered 2012-09-16: 600 mg via ORAL
  Filled 2012-09-16: qty 1

## 2012-09-16 MED ORDER — ALUM & MAG HYDROXIDE-SIMETH 200-200-20 MG/5ML PO SUSP: 30.0000 mL | Freq: Four times a day (QID) | ORAL | Status: DC | PRN

## 2012-09-16 MED ORDER — ONDANSETRON HCL 4 MG PO TABS: 4.0000 mg | ORAL_TABLET | Freq: Three times a day (TID) | ORAL | Status: DC | PRN

## 2012-09-16 MED ORDER — LORAZEPAM 1 MG PO TABS
1.0000 mg | ORAL_TABLET | Freq: Four times a day (QID) | ORAL | Status: DC | PRN
  Administered 2012-09-16 (×2): 1 mg via ORAL
  Filled 2012-09-16 (×2): qty 1

## 2012-09-16 MED ORDER — ACETAMINOPHEN 325 MG PO TABS
650.0000 mg | ORAL_TABLET | Freq: Four times a day (QID) | ORAL | Status: DC | PRN
  Administered 2012-09-16 (×2): 650 mg via ORAL
  Filled 2012-09-16: qty 1
  Filled 2012-09-16 (×2): qty 2

## 2012-09-16 NOTE — ED Notes (Signed)
Pt administered geodon 20 mg IM before coming over to unit, unable to answer any questions at this time other than he thinks some kind of electronic configuration is controlling him.

## 2012-09-16 NOTE — ED Provider Notes (Addendum)
Pt complains of generalized HA, leg pain.  Pt supposedly on home percocet for unknown chronic pain.  No opiates found on UDS.  Review of prior records show no indication of chronic pain.  No holding orders regarding routine meds.  Added tyelonol, ibuprofen, ativan, nausea meds and maalox.  Original call to EMS was for leg pain, but with EMS, pt changed location of pain and delusional and psychosis became evident.  I do not think narcotics for this pain which may be part of his psychosis is warranted and may further cloud his mentation.  Gavin Pound. Oletta Lamas, MD 09/16/12 1032  Gavin Pound. Carleton Vanvalkenburgh, MD 09/16/12 1034

## 2012-09-16 NOTE — ED Notes (Signed)
Pt states he thinks the building needs to be condemned because of the "electricity and electron."  Asked if there was anything in particular that he found to be a problem, but pt unable to explain.  Pt thinks the building will blow up someday because of the "electricity."
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
## 2012-09-16 NOTE — ED Notes (Signed)
Silver metal colored necklace with key found on pt.  Pt necklace with pt's belongings in locker 36

## 2012-09-16 NOTE — ED Notes (Signed)
Pt states he gets electrical therapy outpatient for his chronic pain.  Told we do not have this therapy in the ED.  Will reassess pain level after tylenol and ativan.

## 2012-09-16 NOTE — [facility] Counselor (Signed)
Pt.'s bed is now available, Dr Readling 400-1.  This Clinical research associate attempted to talk with pt regarding inpt admission process--voluntarily signing self in at [facility] for treatment.  Pt told this Clinical research associate that he wanted to read voluntary admission form and this Clinical research associate encouraged pt to do so and if he had any questions, this Clinical research associate would glad to answer them.  Pt began to read 1st paragraph and stated he didn't understand it and this writer proceeded to explain. This Clinical research associate told pt that he would be voluntarily sign self in to [facility] and allowing/authorizing the psychiatrist/nursing staff/social work staff and mental health technicians to provide treatment to him in what manner the staff deems necessary as per ordered by the staff psychiatrist assigned to him during his stay with [facility].  The pt said he didn't agree with the 1st on voluntary admission form, says that he didn't see the doctor for his chronic pain and this writer inquired where the chronic pain area is that he's experiencing and he was unable to tell this Clinical research associate where the pain is. Pt says that hospital is playing games and he has never seen a doctor and told this writer that he would sign voluntary admission form until he saw the doctor.  He then replied--"im tired of the bullshit games around and the building violations here, I have a family a reunion going on and I can't be in the hospital long, how long am I going to be there".  This Clinical research associate explained that the psychiatrist will discuss in detail the treatment plan that the pt will be engaged in when he visits with him tomorrow.  Pt then began writing notes and underlining sentences on the voluntary admission form--'I Jireh Elmore [id number] agrees to go to [facility] only to talk to his, to see why the hell he's been been givin me meds without knowing what was ingertince'.  This Clinical research associate observed pt scanning the room, responding to someone to help him complete the above statement.  Pt said he wanted his personal physician to  come in and talk to him.  It is for this reason, the pt has been placed under IVC, he is not competent to sign self in. Pt will be transported to [facility] for treatment after IVC papers are served by [employer].
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
## 2012-09-16 NOTE — [facility] Counselor (Signed)
[facility] said Dr. Arfeen had accepted the pt pending bed availability per Sue at [facility].   Crescencio Jozwiak, LPC 

## 2012-09-17 ENCOUNTER — Ambulatory Visit: Payer: Medicare Other | Admitting: Physical Therapy

## 2012-09-17 ENCOUNTER — Inpatient Hospital Stay (HOSPITAL_COMMUNITY)
Admission: AD | Admit: 2012-09-17 | Discharge: 2012-09-22 | DRG: 885 | Disposition: A | Discharge status: Home / Self Care | Payer: Medicare Other | Source: Other Acute Inpatient Hospital | Attending: Psychiatry | Admitting: Psychiatry

## 2012-09-17 ENCOUNTER — Encounter (HOSPITAL_COMMUNITY): Payer: Self-pay

## 2012-09-17 DIAGNOSIS — F1290 Cannabis use, unspecified, uncomplicated: Secondary | ICD-10-CM | POA: Diagnosis present

## 2012-09-17 DIAGNOSIS — F1210 Cannabis abuse, uncomplicated: Secondary | ICD-10-CM

## 2012-09-17 DIAGNOSIS — F200 Paranoid schizophrenia: Secondary | ICD-10-CM | POA: Diagnosis present

## 2012-09-17 HISTORY — DX: Essential (primary) hypertension: I10

## 2012-09-17 HISTORY — DX: Cannabis use, unspecified, uncomplicated: F12.90

## 2012-09-17 MED ORDER — ALUM & MAG HYDROXIDE-SIMETH 200-200-20 MG/5ML PO SUSP
30.0000 mL | ORAL | Status: DC | PRN
  Administered 2012-09-18: 30 mL via ORAL

## 2012-09-17 MED ORDER — MAGNESIUM HYDROXIDE 400 MG/5ML PO SUSP
30.0000 mL | Freq: Every day | ORAL | Status: DC | PRN
  Administered 2012-09-21: 30 mL via ORAL

## 2012-09-17 MED ORDER — HYDROCHLOROTHIAZIDE 12.5 MG PO CAPS
12.5000 mg | ORAL_CAPSULE | Freq: Every day | ORAL | Status: DC
Start: 2012-09-17 — End: 2012-09-22
  Administered 2012-09-17 – 2012-09-22 (×6): 12.5 mg via ORAL
  Filled 2012-09-17 (×8): qty 1

## 2012-09-17 MED ORDER — OLANZAPINE 10 MG PO TBDP
10.0000 mg | ORAL_TABLET | Freq: Every day | ORAL | Status: DC
  Administered 2012-09-17 – 2012-09-19 (×3): 10 mg via ORAL
  Filled 2012-09-17 (×5): qty 1

## 2012-09-17 MED ORDER — ACETAMINOPHEN 325 MG PO TABS: 650.0000 mg | ORAL_TABLET | Freq: Four times a day (QID) | ORAL | Status: DC | PRN

## 2012-09-17 MED ORDER — TRAZODONE HCL 50 MG PO TABS
50.0000 mg | ORAL_TABLET | Freq: Every evening | ORAL | Status: DC | PRN
  Administered 2012-09-19 – 2012-09-21 (×3): 50 mg via ORAL
  Filled 2012-09-17: qty 28
  Filled 2012-09-17 (×3): qty 1

## 2012-09-17 NOTE — Progress Notes (Addendum)
D   Pt is pleasant on approach and cooperative   His thoughts are disorganized and he discussed with this staff member that he has a electron machine hooked up to him and it was pumping morphine into his stomach   He was talking about the morphine killing his brother   He said they are trying to get me into trouble but never said who that was   Pt has pressured speech and is hyperverbal   He appears to be responding to internal stimuli as he talks to people who arent there  Pt also said he would not take anymore medications until he talks with the doctor A   Verbal support given   Provide reality orientation and redirect as needed  Q 15 min checks R   Pt is safe at present time

## 2012-09-17 NOTE — Treatment Plan (Signed)
Interdisciplinary Treatment Plan Update (Adult)  Date: 09/17/2012  Time Reviewed: 10:32 AM   Progress in Treatment: Attending groups: Yes Participating in groups: Yes Taking medication as prescribed: Yes Tolerating medication: Yes   Family/Significant other contact made: Not yet  Patient understands diagnosis:  No No insight Discussing patient identified problems/goals with staff:  Yes See below Medical problems stabilized or resolved:  Yes Denies suicidal/homicidal ideation: Yes Issues/concerns per patient self-inventory:  Yes  Other:  New problem(s) identified: N/A  Reason for Continuation of Hospitalization: Delusions  Medication stabilization  Interventions implemented related to continuation of hospitalization:   Additional comments:  Estimated length of stay:  Discharge Plan:  New goal(s): N/A  Review of initial/current patient goals per problem list:   1.  Goal(s): Eliminate paranoid psychosis  Met:  No  Target date:9/30  As evidenced by: self report, observation  2.  Goal (s): Establish contact with family , friends and/or [facility] for collaterol information  Met:  No  Target date:9/27  As evidenced by: confirmation of conversation  3.  Goal(s):"I want to be released ASAP"  Met:  No  Target date:9/30  As evidenced by:D/C order  4.  Goal(s):Identify comprehensive mental wellness plan  Met:  No  Target date:9/30  As evidenced by: self report  Attendees: Patient:  Lucas Knapp 09/17/2012 10:32 AM  Family:     Physician:  Harvie Heck Readling 09/17/2012 10:32 AM   Nursing: Thayer Ohm Just   09/17/2012 10:32 AM   Case Manager:  Richelle Ito,  09/17/2012 10:32 AM   Counselor:  Veto Kemps 09/17/2012 10:32 AM   Other:     Other:     Other:     Other:      Scribe for Treatment Team:   Ida Rogue, 09/17/2012 10:32 AM
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
## 2012-09-17 NOTE — Progress Notes (Signed)
[facility] Group Notes:  (Counselor/Nursing/MHT/Case Management/Adjunct)  09/17/2012 1:56 PM  Type of Therapy:  Group Therapy  Participation Level:  Did Not Attend     Lucas Knapp 09/17/2012, 1:56 PM

## 2012-09-17 NOTE — Progress Notes (Addendum)
09/17/2012         Time: 0930      Group Topic/Focus: The focus of this group is on promoting emotional and psychological well-being through the process of creative expression, relaxation, socialization, fun and enjoyment.  Participation Level: None  Participation Quality: Intrusive and Monopolizing  Affect: Irritable   Cognitive: Confused   Additional Comments: Patient hyperverbal, inappropriate, reporting he is a king, his name is not Henderson, and reports the hospital staff have been injecting him with heroine and morphine. RT tried numerous times to redirect the patient without success. Patient was perseverating on those topics and wouldn't give other group members or RT the opportunity to speak, so he was asked to leave. Patient also observed to be feeding off a male peer who was becoming irritable and would chime in after anything she said. Patient's workbook was given to MHT as he was naked in his room when RT tried to give it to him.   Swara Donze 09/17/2012 12:05 PM

## 2012-09-17 NOTE — Progress Notes (Signed)
D: Pt is interactive on milieu; denies SI/HI; patient does report that he is hearing music at this time; but denies visual hallucinations; patient is very disorganized in his thinking and is delusional and suspicious; patient is assertive and animated while telling his stories about someone messing with his "electronic configuration that messes with his neurons" A: Monitored q 15 minutes; encouraged to express any feelings and/or concerns; medication administration per physician orders . R: Pt interacts with peers and staff appropriately; patient is taking medications as prescribed ; will continue to monitor

## 2012-09-17 NOTE — Progress Notes (Signed)
[name] is seen out in the milieu. HE is hyperactive, frequently seen pacing up and down the hall. HE is hypomanic, with disorganized speech, demonstrating word salad, impulsive behaviors. Labile affect. Guarded upon approach.He has attended his groups today and completed his AM self inventory .  A He is restarted on home meds HCTZ and zydis at HS.  R Safety is in place and POC cont PD RNBC

## 2012-09-17 NOTE — Progress Notes (Signed)
Psychoeducational Group Note  Date:  09/17/2012 Time:  1100  Group Topic/Focus:  Relapse Prevention Planning:   The focus of this group is to define relapse and discuss the need for planning to combat relapse.  Participation Level:  Minimal  Participation Quality:  Redirectable  Affect:  Anxious  Cognitive:  Confused  Insight:  Limited  Engagement in Group:  Limited  Additional Comments:  Pt. Attended group for a brief moment. Responses were not specific to the topic of group or questions asked by group facilitator.     Andron Marrazzo A 09/17/2012, 6:19 PM
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
## 2012-09-17 NOTE — [facility] Suicide Risk Assessment (Signed)
Suicide Risk Assessment  Admission Assessment     Nursing information obtained from:  Patient Demographic factors:  Male;Adolescent or young adult;Access to firearms Current Mental Status:  NA Loss Factors:  Loss of significant relationship Historical Factors:  Family history of mental illness or substance abuse;Domestic violence in family of origin Risk Reduction Factors:  Living with another person, especially a relative;Positive social support;Positive coping skills or problem solving skills  CLINICAL FACTORS:   Alcohol/Substance Abuse/Dependencies Schizophrenia:   Paranoid or undifferentiated type More than one psychiatric diagnosis Currently Psychotic Unstable or Poor Therapeutic Relationship Previous Psychiatric Diagnoses and Treatments  COGNITIVE FEATURES THAT CONTRIBUTE TO RISK:  Closed-mindedness Loss of executive function Polarized thinking Thought constriction (tunnel vision)    Current Mental Status Per Physician:  Diagnosis:  Axis I:  Schizophrenia - Paranoid Type.  Cannabis Abuse.   The patient was seen today and reports the following:   ADL's: Intact.  Sleep: The patient reports to sleeping reasonably well last night.  Appetite: The patient reports that his appetite is good today.   Mild>(1-10) >Severe  Hopelessness (1-10): 0  Depression (1-10): 0  Anxiety (1-10): 0   Suicidal Ideation: The patient denies any current suicidal ideations.  Plan: No  Intent: No  Means: No   Homicidal Ideation: The patient denies any homicidal ideations today.  Plan: No  Intent: No.  Means: No   General Appearance/Behavior: The patient was angry and hostile during the interview and obviously paranoid and psychotic. Eye Contact: Fair.  Speech: Increased in volume and appropriate in rate.  Illogical speech noted.  Motor Behavior: Agitated and hostile.  Level of Consciousness: Alert and Oriented x 3.  Mental Status: Alert and Oriented x 3.  Mood: Appears mildly  elevated.  Affect: Angry and hostile.  Anxiety Level: No anxiety reported today.  Thought Process: The patient denies any auditory or visual hallucinations today or delusional thinking. However, he is significantly paranoid and delusional. Thought Content: The patient denies any auditory or visual hallucinations today or delusional thinking. However, he is significantly paranoid and delusional. Perception: The patient denies any auditory or visual hallucinations today or delusional thinking.  However, he is significantly paranoid and delusional. Judgment: Poor.  Insight: Poor  Cognition: Oriented to person, place and time.  CIWA: CIWA-Ar Total: 0  COWS: COWS Total Score: 0   Current Medications:    . hydrochlorothiazide  12.5 mg Oral Daily  . OLANZapine zydis  10 mg Oral QHS   Review of Systems:  Neurological: No headaches, seizures or dizziness reported.  G.I.: The patient denies any constipation or stomach upset today.  Musculoskeletal: The patient denies any musculoskeletal related issues.   Time was spent today discussing with the patient his current symptoms. The patient was angry and defensive and it was difficult to obtain information from him.  The patient states that his brother was bringing "lost souls" home with him and these souls were attempting to alter his electrolytes.  The patient is significantly paranoid and delusional and very minimally cooperative.  The patient reports to sleeping reasonably well and reports a good appetite.  He denies any significant feelings of sadness, anhedonia or depressed mood as well as denies any suicidal or homicidal ideations.  While the patient denies any auditory or visual hallucinations today or delusional thinking, he is significantly paranoid and delusional. The patient denies any anxiety symptoms today.  Treatment Plan Summary:  1. Daily contact with patient to assess and evaluate symptoms and progress in treatment.  2.  Medication  management  3. The patient will deny suicidal ideations or homicidal ideations for 48 hours prior to discharge and have a depression and anxiety rating of 3 or less. The patient will also deny any auditory or visual hallucinations or delusional thinking.  4. The patient will deny any symptoms of substance withdrawal at time of discharge.   Plan:  1. Will restart the patient on the medication Zyprexa but will increase the medication to 10 mgs po qhs for psychosis and for mood stabilization. 2. Will restart the patient on the medication HCTZ at 12.5 mgs po q am for blood pressure control. 3. Laboratory Studies reviewed.  4. Will continue to monitor.  5. Will continue the patient on his IVC until further stabilized.  SUICIDE RISK:  Minimal: No identifiable suicidal ideation.  Patients presenting with no risk factors but with morbid ruminations; may be classified as minimal risk based on the severity of the depressive symptoms  RANDY READLING 09/17/2012, 1:59 PM

## 2012-09-18 MED ORDER — IBUPROFEN 600 MG PO TABS
600.0000 mg | ORAL_TABLET | Freq: Four times a day (QID) | ORAL | Status: DC | PRN
  Administered 2012-09-18 – 2012-09-21 (×5): 600 mg via ORAL
  Filled 2012-09-18 (×5): qty 1

## 2012-09-18 MED ORDER — NICOTINE 21 MG/24HR TD PT24
21.0000 mg | MEDICATED_PATCH | Freq: Every day | TRANSDERMAL | Status: DC
  Administered 2012-09-18 – 2012-09-22 (×5): 21 mg via TRANSDERMAL
  Filled 2012-09-18 (×5): qty 1

## 2012-09-18 MED ORDER — OLANZAPINE 5 MG PO TBDP
5.0000 mg | ORAL_TABLET | ORAL | Status: DC | PRN
  Administered 2012-09-18 – 2012-09-20 (×5): 5 mg via ORAL
  Filled 2012-09-18 (×5): qty 1
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
## 2012-09-18 NOTE — Progress Notes (Signed)
[facility] MD Progress Note  09/18/2012 2:11 PM  Diagnosis:  Axis I: Paranoid Schizophrenia  ADL's:  Impaired  Sleep: Fair  Appetite:  Good  Suicidal Ideation:  Plan:  No Intent:  NO Means:  ]NO Homicidal Ideation:  Plan:  NO Intent:  NO Means:  NO  AEB (as evidenced by): This is a 26 year old African-American male with psychosis and aggressive behaviors with family, reports of patient being controlled by electronic configuration, paranoia and delusional thinking. Patient reports people sending satellites are watching and controlling things here. He is easily escalating and picking on another peer on the unit.   Mental Status Examination/Evaluation: Objective:  Appearance: Bizarre and Guarded  Eye Contact::  Good  Speech:  Clear and Coherent  Volume:  Normal  Mood:  Anxious and Irritable  Affect:  Constricted  Thought Process:  Disorganized, Irrelevant and Loose  Orientation:  Full  Thought Content:  Delusions, Ideas of Reference:   Paranoia and Paranoid Ideation  Suicidal Thoughts:  No  Homicidal Thoughts:  No  Memory:  Recent;   Fair Remote;   Fair  Judgement:  Impaired  Insight:  Lacking  Psychomotor Activity:  Normal  Concentration:  Fair  Recall:  Fair  Akathisia:  Yes  Handed:  Right  AIMS (if indicated):     Assets:  Manufacturing systems engineer Housing Leisure Time Social Support Vocational/Educational  Sleep:  Number of Hours: 1    Vital Signs:Blood pressure 141/94, pulse 114, temperature 98.7 F (37.1 C), temperature source Oral, resp. rate 18, height 5\' 4"  (1.626 m), weight 201 lb (91.173 kg). Current Medications: Current Facility-Administered Medications  Medication Dose Route Frequency Provider Last Rate Last Dose  . acetaminophen (TYLENOL) tablet 650 mg  650 mg Oral Q6H PRN Caroleen Hamman, NP      . alum & mag hydroxide-simeth (MAALOX/MYLANTA) 200-200-20 MG/5ML suspension 30 mL  30 mL Oral Q4H PRN Caroleen Hamman, NP   30 mL at 09/18/12 0511  .  hydrochlorothiazide (MICROZIDE) capsule 12.5 mg  12.5 mg Oral Daily Curlene Labrum Readling, MD   12.5 mg at 09/18/12 0802  . ibuprofen (ADVIL,MOTRIN) tablet 600 mg  600 mg Oral Q6H PRN Mickie D. Adams, PA   600 mg at 09/18/12 1025  . magnesium hydroxide (MILK OF MAGNESIA) suspension 30 mL  30 mL Oral Daily PRN Caroleen Hamman, NP      . nicotine (NICODERM CQ - dosed in mg/24 hours) patch 21 mg  21 mg Transdermal Q0600 Curlene Labrum Readling, MD   21 mg at 09/18/12 1122  . OLANZapine zydis (ZYPREXA) disintegrating tablet 10 mg  10 mg Oral QHS Curlene Labrum Readling, MD   10 mg at 09/17/12 2200  . OLANZapine zydis (ZYPREXA) disintegrating tablet 5 mg  5 mg Oral Q4H PRN Mickie D. Adams, PA   5 mg at 09/18/12 1025  . traZODone (DESYREL) tablet 50 mg  50 mg Oral QHS PRN Caroleen Hamman, NP        Lab Results: No results found for this or any previous visit (from the past 48 hour(s)).  Physical Findings: AIMS: Facial and Oral Movements Muscles of Facial Expression: None, normal Lips and Perioral Area: None, normal Jaw: None, normal Tongue: None, normal,Extremity Movements Upper (arms, wrists, hands, fingers): None, normal Lower (legs, knees, ankles, toes): None, normal, Trunk Movements Neck, shoulders, hips: None, normal, Overall Severity Severity of abnormal movements (highest score from questions above): None, normal Incapacitation due to abnormal movements: None, normal Patient's awareness of abnormal movements (rate  only patient's report): No Awareness, Dental Status Current problems with teeth and/or dentures?: Yes (cracked tooth on left side) Does patient usually wear dentures?: No  CIWA:    COWS:     Treatment Plan Summary: Daily contact with patient to assess and evaluate symptoms and progress in treatment Medication management  Plan: Continue current treatment plan and no medication changed today.  Lucas Knapp,Lucas R. 09/18/2012, 2:11 PM

## 2012-09-18 NOTE — Progress Notes (Signed)
D: Writer greeted pt in bed. Pt just returned from the bathroom. Pt was calm and denied having anything that he needed for this writer to address.  A: Continued support and availability as needed was extended to this pt. Q72min checks remain for this pt.  R: Pt remains safe at this time with q4min checks. Writer will continue to monitor pt.

## 2012-09-18 NOTE — Progress Notes (Signed)
Psychoeducational Group Note  Date:  09/18/2012 Time:  0945 am  Group Topic/Focus:  Identifying Needs:   The focus of this group is to help patients identify their personal needs that have been historically problematic and identify healthy behaviors to address their needs.  Participation Level:  Did Not Attend    Andrena Mews 09/18/2012,10:22 AM

## 2012-09-18 NOTE — Progress Notes (Signed)
Patient ID: Lucas Knapp, male   DOB: December 27, 1985, 26 y.o.   MRN: [id number]    [facility] Group Notes:  (Counselor/Nursing/MHT/Case Management/Adjunct)  09/18/2012 11 AM  Type of Therapy:  Aftercare Planning, Group Therapy, Dance/Movement Therapy   Participation Level:  Did Not Attend  Pt did not attend counseling group. Therapist attempted to check-in with pt individually but pt was lying on bed naked. Pt did not receive daily workbook.    Cassidi Long 09/18/2012. 11:07 AM
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
## 2012-09-19 MED ORDER — WITCH HAZEL-GLYCERIN EX PADS
MEDICATED_PAD | CUTANEOUS | Status: DC | PRN
  Administered 2012-09-19 – 2012-09-20 (×2): via TOPICAL
  Filled 2012-09-19 (×2): qty 100

## 2012-09-19 MED ORDER — HYDROCORTISONE ACE-PRAMOXINE 1-1 % RE FOAM
1.0000 | Freq: Four times a day (QID) | RECTAL | Status: DC
  Administered 2012-09-19 – 2012-09-22 (×9): 1 via RECTAL
  Filled 2012-09-19 (×2): qty 10

## 2012-09-19 MED ORDER — HYDROCORTISONE 1 % EX CREA: TOPICAL_CREAM | Freq: Every day | CUTANEOUS | Status: DC | PRN

## 2012-09-19 NOTE — [facility] Counselor (Signed)
Adult Comprehensive Assessment  Patient ID: Lucas Knapp, male   DOB: 05/16/1986, 26 y.o.   MRN: [id number]  Information Source: Information source: Patient  Current Stressors:  Educational / Learning stressors: None Employment / Job issues: Pt works for [employer] Family Relationships: Pt reported tension with youngest brother  Surveyor, quantity / Lack of resources (include bankruptcy): Pt reported being stressed about money  Housing / Lack of housing: Pt expressed wanting public housing  Physical health (include injuries & life threatening diseases): None  Social relationships: None  Substance abuse: Marijuana use  Bereavement / Loss: None   Living/Environment/Situation:  Living Arrangements: Other relatives (Brother ) Living conditions (as described by patient or guardian): Pt reported the environment is stressful at times How long has patient lived in current situation?: 1 year  What is atmosphere in current home: Chaotic  Family History:  Marital status: Single Does patient have children?: No  Childhood History:  By whom was/is the patient raised?: Both parents Additional childhood history information: Pt reported that his childhood was "sheltered" Description of patient's relationship with caregiver when they were a child: Pt reported that the relationship was good  Patient's description of current relationship with people who raised him/her: Pt reported that he is still close with his parents  Does patient have siblings?: Yes Number of Siblings: 2  Description of patient's current relationship with siblings: Pt reports that relationship is ok but there is some tension with youngest brother  Did patient suffer any verbal/emotional/physical/sexual abuse as a child?: No Did patient suffer from severe childhood neglect?: No Has patient ever been sexually abused/assaulted/raped as an adolescent or adult?: No Was the patient ever a victim of a crime or a disaster?: No Witnessed  domestic violence?: No Has patient been effected by domestic violence as an adult?: No  Education:  Highest grade of school patient has completed: Chief Operating Officer' Degree  Currently a student?: No Learning disability?: No  Employment/Work Situation:   Employment situation: Employed Where is patient currently employed?: Works through [employer]  How long has patient been employed?: Since 2008 Patient's job has been impacted by current illness: No What is the longest time patient has a held a job?: 2 years  Where was the patient employed at that time?: [place] [employer]  Has patient ever been in the [employer]?: No Has patient ever served in combat?: No  Financial Resources:   Financial resources: Income from employment;Receives SSDI;Food stamps Does patient have a representative payee or guardian?: No  Alcohol/Substance Abuse:   What has been your use of drugs/alcohol within the last 12 months?: Marijuana  If attempted suicide, did drugs/alcohol play a role in this?: No Alcohol/Substance Abuse Treatment Hx: Past Tx, Outpatient If yes, describe treatment: [facility]  Has alcohol/substance abuse ever caused legal problems?: Yes  Social Support System:   Patient's Community Support System: Fair Development worker, community Support System: Friends  Type of faith/religion: Pt reported "Comptroller" How does patient's faith help to cope with current illness?: Pt reported that he is researching religion  Leisure/Recreation:   Leisure and Hobbies: Swimming, playing pool, reading  Strengths/Needs:   What things does the patient do well?: Learning new things  In what areas does patient struggle / problems for patient: Staying positive   Discharge Plan:   Does patient have access to transportation?: Yes (Brother or friend) Will patient be returning to same living situation after discharge?: Yes Currently receiving community mental health services: Yes (From Whom) ([facility] ) If no, would patient like referral for services when discharged?: Yes (What county[place] Medical sales [place] ) Does patient have financial barriers related to discharge medications?: Yes Patient description of barriers related to discharge medications: Perhaps, pt reported that he is trying to get back on medicaid   Summary/Recommendations:   Summary and Recommendations (to be completed by the evaluator): Recommendations for treatment include crisis stabilization, case management, medication management, psycho-education to teach coping skills, and group therapy.   Cassidi Long. 09/19/2012

## 2012-09-19 NOTE — Progress Notes (Signed)
Psychoeducational Group Note  Date:  09/19/2012 Time:  0945 am  Group Topic/Focus:  Making Healthy Choices:   The focus of this group is to help patients identify negative/unhealthy choices they were using prior to admission and identify positive/healthier coping strategies to replace them upon discharge.  Participation Level:  Active  Participation Quality:  Appropriate  Affect:  Labile  Cognitive:  Alert  Insight:  Good  Engagement in Group:  Good  Additional Comments:    Andrena Mews 09/19/2012, 10:29 AM

## 2012-09-19 NOTE — Progress Notes (Signed)
Patient ID: Lucas Knapp, male   DOB: 05-08-1986, 26 y.o.   MRN: [id number]  [facility] Group Notes:  (Counselor/Nursing/MHT/Case Management/Adjunct)  09/19/2012 11 AM  Type of Therapy:  Aftercare Planning, Group Therapy, Dance/Movement Therapy   Participation Level:  Minimal  Participation Quality:  Drowsy and Inattentive  Affect:  Blunted  Cognitive:  Confused and Delusional  Insight:  Limited  Engagement in Group:  Limited  Engagement in Therapy:  Limited  Modes of Intervention:  Clarification, Problem-solving, Role-play, Socialization and Support  Summary of Progress/Problems: Pt. attended and participated in aftercare planning group and counseling group on healthy support systems and how to support ourselves. Pt. accepted information on suicide prevention, warning signs to look for with suicide and crisis line numbers to use. The pt. agreed to call crisis line numbers if having warning signs or having thoughts of suicide. Pt expressed concerns about his medication not working and his desire to have a radio.  Pt then stated he is having racing thoughts and began to tap his feet and appeared to be bobbing his head to music.     Debarah Crape 09/19/2012. 11:33 AM

## 2012-09-19 NOTE — Progress Notes (Signed)
[facility] Adult Inpatient Family/Significant Other Suicide Prevention Education  Suicide Prevention Education:  Patient Refusal for Family/Significant Other Suicide Prevention Education: The patient Lucas Knapp has refused to provide written consent for family/significant other to be provided Family/Significant Other Suicide Prevention Education during admission and/or prior to discharge.  Physician notified.  Pt. accepted information on suicide prevention, warning signs to look for with suicide and crisis line numbers to use. The pt. agreed to call crisis line numbers if having warning signs or having thoughts of suicide.   [facility] 09/19/2012, 3:35 PM

## 2012-09-19 NOTE — Progress Notes (Signed)
Currently resting quietly in bed with eyes closed. Respirations are even and unlabored. No acute distress noted. Safety has been maintained with Q15 minute observation. Will continue Q15 minute observation and continue current POC. 

## 2012-09-19 NOTE — Progress Notes (Signed)
[facility] MD Progress Note  09/19/2012 11:44 AM  Diagnosis:  Axis I: Paranoid Schizophrenia  ADL's:  Intact  Sleep: Good  Appetite:  Good  Suicidal Ideation:  Plan:  Denied Intent:  No Means:  NO Homicidal Ideation:  Plan:  No Intent:  NO Means:  NO  AEB (as evidenced by):  Mental Status Examination/Evaluation: Objective:  Appearance: Bizarre, Disheveled and Guarded  Eye Contact::  Good  Speech:  Clear and Coherent  Volume:  Normal  Mood:  Euthymic  Affect:  Appropriate and Congruent  Thought Process:  Disorganized, Loose and Tangential  Orientation:  Full  Thought Content:  Delusions, Ideas of Reference:   Paranoia Delusions, Paranoid Ideation and Rumination  Suicidal Thoughts:  No  Homicidal Thoughts:  No  Memory:  Recent;   Fair  Judgement:  Impaired  Insight:  Lacking  Psychomotor Activity:  Normal  Concentration:  Fair  Recall:  Fair  Akathisia:  No  Handed:  Right  AIMS (if indicated):     Assets:  Communication Skills Desire for Improvement Housing Physical Health Social Support Transportation  Sleep:  Number of Hours: 2.25    Vital Signs:Blood pressure 139/97, pulse 93, temperature 98.2 F (36.8 C), temperature source Oral, resp. rate 20, height 5\' 4"  (1.626 m), weight 201 lb (91.173 kg). Current Medications: Current Facility-Administered Medications  Medication Dose Route Frequency Provider Last Rate Last Dose  . acetaminophen (TYLENOL) tablet 650 mg  650 mg Oral Q6H PRN Caroleen Hamman, NP      . alum & mag hydroxide-simeth (MAALOX/MYLANTA) 200-200-20 MG/5ML suspension 30 mL  30 mL Oral Q4H PRN Caroleen Hamman, NP   30 mL at 09/18/12 0511  . hydrochlorothiazide (MICROZIDE) capsule 12.5 mg  12.5 mg Oral Daily Curlene Labrum Readling, MD   12.5 mg at 09/19/12 0755  . ibuprofen (ADVIL,MOTRIN) tablet 600 mg  600 mg Oral Q6H PRN Mickie D. Adams, PA   600 mg at 09/19/12 0756  . magnesium hydroxide (MILK OF MAGNESIA) suspension 30 mL  30 mL Oral Daily PRN Caroleen Hamman,  NP      . nicotine (NICODERM CQ - dosed in mg/24 hours) patch 21 mg  21 mg Transdermal Q0600 Curlene Labrum Readling, MD   21 mg at 09/19/12 0600  . OLANZapine zydis (ZYPREXA) disintegrating tablet 10 mg  10 mg Oral QHS Curlene Labrum Readling, MD   10 mg at 09/18/12 2146  . OLANZapine zydis (ZYPREXA) disintegrating tablet 5 mg  5 mg Oral Q4H PRN Mickie D. Adams, PA   5 mg at 09/19/12 0755  . traZODone (DESYREL) tablet 50 mg  50 mg Oral QHS PRN Caroleen Hamman, NP      . DISCONTD: hydrocortisone cream 1 %   Topical Daily PRN Caroleen Hamman, NP        Lab Results: No results found for this or any previous visit (from the past 48 hour(s)).  Physical Findings: AIMS: Facial and Oral Movements Muscles of Facial Expression: None, normal Lips and Perioral Area: None, normal Jaw: None, normal Tongue: None, normal,Extremity Movements Upper (arms, wrists, hands, fingers): None, normal Lower (legs, knees, ankles, toes): None, normal, Trunk Movements Neck, shoulders, hips: None, normal, Overall Severity Severity of abnormal movements (highest score from questions above): None, normal Incapacitation due to abnormal movements: None, normal Patient's awareness of abnormal movements (rate only patient's report): No Awareness, Dental Status Current problems with teeth and/or dentures?: Yes (cracked tooth on left side) Does patient usually wear dentures?: No  CIWA:  COWS:     Treatment Plan Summary: Daily contact with patient to assess and evaluate symptoms and progress in treatment Medication management  Plan: Continue current treatment plan and disposition plans as per primary treatment team. Patient want to talk to his case manager regarding the discharge plan in morning.  Catalea Labrecque,JANARDHAHA R. 09/19/2012, 11:44 AM

## 2012-09-19 NOTE — Progress Notes (Signed)
D   Pts thinking has improved some  And he is less labile and irritable   He was logical and insightful during group today   He said one of his stress relievers was to exercise stretch and walk  He has done all of those since coming in the hospital   He has complained of a cough today and has received throat lozenges and instructed it was most likely from not smoking but staff would continue to monitor for worsening symptoms  A   Verbal support given   Medications administered and effectiveness monitored   Q 15 min checks   R   Pt safe at present

## 2012-09-19 NOTE — Progress Notes (Signed)
Pt remains bizarre in affect, anxious in mood. He is disorganized and tangential. Behavior is calm thus far tonight. Refused group. On phone frequently with raised voice though is guarded as to details. Support given. Medicated per orders. He denies SI/HI/AVH and remains safe at this time. Lawrence Marseilles

## 2012-09-20 ENCOUNTER — Ambulatory Visit: Payer: Medicare Other | Admitting: Physical Therapy

## 2012-09-20 MED ORDER — CARBAMAZEPINE ER 200 MG PO TB12
400.0000 mg | ORAL_TABLET | Freq: Every day | ORAL | Status: DC
  Administered 2012-09-21: 400 mg via ORAL
  Filled 2012-09-20 (×2): qty 1

## 2012-09-20 MED ORDER — CARBAMAZEPINE ER 400 MG PO TB12
400.0000 mg | ORAL_TABLET | Freq: Every day | ORAL | Status: DC
  Filled 2012-09-20: qty 1

## 2012-09-20 MED ORDER — CARBAMAZEPINE ER 200 MG PO TB12
400.0000 mg | ORAL_TABLET | Freq: Once | ORAL | Status: AC
  Administered 2012-09-20: 400 mg via ORAL
  Filled 2012-09-20 (×2): qty 2

## 2012-09-20 MED ORDER — OLANZAPINE 10 MG PO TBDP
20.0000 mg | ORAL_TABLET | Freq: Every day | ORAL | Status: DC
  Administered 2012-09-20 – 2012-09-21 (×2): 20 mg via ORAL
  Filled 2012-09-20 (×4): qty 2

## 2012-09-20 NOTE — Progress Notes (Signed)
Patient is angry and irritable today; he has been pacing the hallways.  He has disorganized thinking and tangental speech.  He denies any SI/HI/AVH.  He has been intrusive at time, pounding on the medication window, demanding to speak with the MD.  He was requested to leave today.    Continue to monitor medication management and MD orders.  Safety checks completed every 15 minutes.  Reassure patient he is safe on the unit.  Redirect patient as needed.

## 2012-09-20 NOTE — Progress Notes (Signed)
D: In hallway on approach. Appears flat. Calm and cooperative with assessment. No acute distress noted. Appears to be slightly more organized vs previous night shift. He has been more calm on milieu. He states he feels much better and is ready for D/C tomorrow he hopes. States he was told by CM he would be going home but he states he doesn't trust that that will be the case. Has no issue with staying tonight and he states he hopes to get a good nights rest. He requested a prn for insomnia and hemorrhoids. Otherwise offers no questions or concerns. Denies SI/HI/AVH and contracts for safety.  A: Support and encouragement provided. Safety has been maintained with Q15 minute observation. POC and medications for the shift reviewed and understanding verbalized. Meds given as ordered by MD.   R: Pt remains safe. He seems to be improving and is more organized vs previous night shift. He is compliant with meds but is mixed complaint with programming. Will f/u response to prns, continue Q15 minute observation and continue current POC.

## 2012-09-20 NOTE — Progress Notes (Signed)
Psychoeducational Group Note  Date:  09/20/2012 Time:  1100  Group Topic/Focus:  Self Care:   The focus of this group is to help patients understand the importance of self-care in order to improve or restore emotional, physical, spiritual, interpersonal, and financial health.  Participation Level:  Minimal  Participation Quality:  Monopolizing and Redirectable  Affect:  Irritable  Cognitive:  Confused  Insight:  Limited  Engagement in Group:  Limited  Additional Comments:  Pt attended group but had to be redirected several times to stay on task.  Leotha Westermeyer E 09/20/2012, 1:36 PM

## 2012-09-20 NOTE — Progress Notes (Signed)
[facility] MD Progress Note                                         09/20/2012    Lucas Knapp 10/22/1986    [id number] Hospital day #3  1. Schizophrenia, paranoid type   2. Cannabis abuse    The patient was seen today and reports the following:  Sleep: ok Appetite: ok  Mild (1-10) Severe  Depression (1-10):   "none" Anxiety (1-10):          "none" Hopelessness (1-10):  Hopefull for the future.  Suicidal Ideation: The patient denies suicidal ideation. Plan: None Intent: None Means: None  Homicidal Ideation: The patient denies homicidal ideation. Plan: None Intent: None Means: None  Eye Contact:  fair General Appearance: casual Behavior:  Cooperative   Motor Behavior: normal Speech: repetitive but goal directed  Mental Status:  Orientation x 3 Level of Consciousness:   drowsy Mood: blunted Affect: flat   Thought Process:  linear Thought Content:  Denies AHV Perception: intact  Judgment: poor Insight: lacking Cognition: at least average  VS: height is 5\' 4"  (1.626 m) and weight is 91.173 kg (201 lb). His oral temperature is 98.5 F (36.9 C). His blood pressure is 132/95 and his pulse is 112. His respiration is 20.  Current Medication:  . carbamazepine  400 mg Oral QHS  . hydrochlorothiazide  12.5 mg Oral Daily  . hydrocortisone-pramoxine  1 applicator Rectal QID  . nicotine  21 mg Transdermal Q0600  . OLANZapine zydis  20 mg Oral QHS  . DISCONTD: OLANZapine zydis  10 mg Oral QHS    Lab results: No results found for this or any previous visit (from the past 48 hours  Group attendance:  ROS:    Constitutional: WDWN Adult in NAD   GI: Negative for N,V,D,C   Neuro: Negative for dizziness, blurred vision, visual changes, headaches   Resp: Negative for wheezing, SOB, cough   Cardio: Negative for CP, diaphoresis, fatigue   MSK: Negative for joint pain, swelling, DROM, or ambulatory difficulties.  Time was spent with the patient discussing the current  symptoms and the response to treatment.  Today he notes that he is affiliated with the [facility] ACT team.  He sees a therapist there Glee Arvin, and Kathlene November.  Treatment Summary: 1. Admit for crisis management and stabilization. 2. Medication management to reduce current symptoms to base line and improve the patient's overall level of functioning 3. Treat health problems as indicated. 4. Develop treatment plan to decrease risk of relapse upon discharge and the need for readmission. 5. Psycho-social education regarding relapse prevention and self care. 6. Health care follow up as needed for medical problems. 7. Restart home medications where appropriate.   Treatment Plan:  Plan:  1. Will increase the patient on the medication Zyprexa to 20mg  a day.   2. Will restart the patient on the medication HCTZ at 12.5 mgs po q am for blood pressure control.  3. Laboratory Studies reviewed.  4. Will continue to monitor.  5. Will continue the patient on his IVC until further stabilized.  6. Will have CM up with [facility] for further information. Rona Ravens. Beyonca Wisz [facility] 09/20/2012

## 2012-09-21 LAB — CARBAMAZEPINE LEVEL, TOTAL: Carbamazepine Lvl: 2.2 ug/mL — ABNORMAL LOW (ref 4.0–12.0)

## 2012-09-21 NOTE — Progress Notes (Signed)
[facility] Group Notes:  (Counselor/Nursing/MHT/Case Management/Adjunct)  09/21/2012 3:34 PM  Type of Therapy:  Group Therapy  09/20/12  Participation Level:  Active  Participation Quality:  Intrusive  Affect:  Angry  Cognitive:  Oriented  Insight:  None  Engagement in Group:  Good  Engagement in Therapy:  Good  Modes of Intervention:  Clarification, Limit-setting and Orientation  Summary of Progress/Problems: Patient was angry and expressed some paranoia about "you people trying to keep me here". Tried to join in discussion, but ideas were distorted and disorganized.   HartisAram Beecham 09/21/2012, 3:34 PM

## 2012-09-21 NOTE — Progress Notes (Signed)
[facility] Case Management Discharge Plan:  Will you be returning to the same living situation after discharge: Yes,  home At discharge, do you have transportation home?:Yes,  brother Do you have the ability to pay for your medications:Yes,  mental health  Interagency Information:     Release of information consent forms completed and in the chart;  Patient's signature needed at discharge.  Patient to Follow up at:  Follow-up Information    Follow up with [name]. (Walk in on Friday between 8 and 9AM for your hospital follow up appointment.  This is where you will see a Dr for your medication until you get back with [facility])    Contact information:   [address]  [place]  [phone]      Follow up with [facility]. (They will be able to work with you when you have finished your application for [place] [employer].  Call Tiffany at this number if you need help)    Contact information:   [phone]         Patient denies SI/HI:   Yes,  yes    Safety Planning and Suicide Prevention discussed:  Yes,  yes  Barrier to discharge identified:No.  Summary and Recommendations:   Lucas Knapp 09/21/2012, 1:48 PM

## 2012-09-21 NOTE — Progress Notes (Signed)
Psychoeducational Group Note  Date:  09/21/2012 Time:  0930  Group Topic/Focus:  Recovery Goals:   The focus of this group is to identify appropriate goals for recovery and establish a plan to achieve them.  Participation Level: Did Not Attend  Participation Quality:  Not Applicable  Affect:  Not Applicable  Cognitive:  Not Applicable  Insight:  Not Applicable  Engagement in Group: Not Applicable  Additional Comments:  Pt was taking a shower and unable to attend group.  Winferd Wease E 09/21/2012, 1:51 PM

## 2012-09-21 NOTE — Progress Notes (Signed)
D: In hallway on approach. Appears flat. Calm and cooperative with assessment. No acute distress noted. Continues to improve and seems organized and goal directed. He states he had a good day and is eager to D/C tomorrow, hopefully. Feels like he is ready and his plan is to move in with family. He requested a prn for constipation. Otherwise offered no questions or concerns. Denies SI/HI/AVH and contracts for safety.   A: Support and encouragement provided. Safety has been maintained with Q15 minute observation. POC and medications for the shift reviewed and understanding verbalized. Meds given as ordered by MD. PRN provided for constipation  R: Pt remains safe. He continues to improve and is more organized vs previous night shift. He is compliant with meds but is mixed complaint with programming. Will f/u response to prns, continue Q15 minute observation and continue current POC.

## 2012-09-21 NOTE — Progress Notes (Signed)
Adult Services Patient-Family Contact/Session  Attendees:  Patient's brother [phone])  Goal(s):  Discharge planning    Safety Concerns:  None   Narrative:  Talked to brother about patient being discharged. He was fine with patient returning home and did not have any safety concerns.  As far as transportation, he thought patient was working out a ride with a friend, but would come and get him if not.  Reported back to patient that counselor had talked with his brother. Called again while patient was present. Told patient he would be discharged on 10/2 and could work out a ride for after lunch. Patient was not pleased but counselor explained that outpatient follow up still needed to be scheduled.  Barrier(s):  None   Interventions:  Discharge planning  Recommendation(s):  Outpatient follow up  Follow-up Required:  No  Explanation:    Veto Kemps 09/21/2012, 3:37 PM

## 2012-09-21 NOTE — Progress Notes (Signed)
[facility] MD Progress Note  09/21/2012 12:19 PM  Current Mental Status Per Physician:  Diagnosis:  Axis I: Schizophrenia - Paranoid Type.  Cannabis Abuse.   The patient was seen today and reports the following:   ADL's: Intact.  Sleep: The patient reports to sleeping reasonably well last night but according to staff he slept approximately 1.25 hours.  Appetite: The patient reports that his appetite is good today.   Mild>(1-10) >Severe  Hopelessness (1-10): 0  Depression (1-10): 0  Anxiety (1-10): 0   Suicidal Ideation: The patient denies any current suicidal ideations.  Plan: No  Intent: No  Means: No   Homicidal Ideation: The patient denies any homicidal ideations today.  Plan: No  Intent: No.  Means: No   General Appearance/Behavior: The patient was friendly and cooperative with this provider today.  Eye Contact: Good.  Speech: Appropriate in rate and volume with no pressuring noted.  Motor Behavior: wnl.  Level of Consciousness: Alert and Oriented x 3.  Mental Status: Alert and Oriented x 3.  Mood: Appears essentially euthymic.  Affect: Appears essentially bright and full.  Anxiety Level: No anxiety reported today.  Thought Process: The patient denies any auditory or visual hallucinations today or delusional thinking.  Thought Content: The patient denies any auditory or visual hallucinations today or delusional thinking. Perception: The patient denies any auditory or visual hallucinations today or delusional thinking.  Judgment: Fair.  Insight: Fair.  Cognition: Oriented to person, place and time.  Sleep:  Number of Hours: 1.25    Vital Signs:Blood pressure 135/91, pulse 100, temperature 97.3 F (36.3 C), temperature source Oral, resp. rate 20, height 5\' 4"  (1.626 m), weight 91.173 kg (201 lb).  Current Medications: Current Facility-Administered Medications  Medication Dose Route Frequency Provider Last Rate Last Dose  . acetaminophen (TYLENOL) tablet 650 mg  650 mg  Oral Q6H PRN Caroleen Hamman, NP      . alum & mag hydroxide-simeth (MAALOX/MYLANTA) 200-200-20 MG/5ML suspension 30 mL  30 mL Oral Q4H PRN Caroleen Hamman, NP   30 mL at 09/18/12 0511  . carbamazepine (TEGRETOL XR) 12 hr tablet 400 mg  400 mg Oral QHS Curlene Labrum Leiam Hopwood, MD      . carbamazepine (TEGRETOL XR) 12 hr tablet 400 mg  400 mg Oral Once Ronny Bacon, MD   400 mg at 09/20/12 2200  . hydrochlorothiazide (MICROZIDE) capsule 12.5 mg  12.5 mg Oral Daily Curlene Labrum Carlie Corpus, MD   12.5 mg at 09/21/12 0819  . hydrocortisone-pramoxine (PROCTOFOAM-HC) rectal foam 1 applicator  1 applicator Rectal QID Verne Spurr, PA-C   1 applicator at 09/21/12 [phone]  . ibuprofen (ADVIL,MOTRIN) tablet 600 mg  600 mg Oral Q6H PRN Mickie D. Adams, PA   600 mg at 09/21/12 1914  . magnesium hydroxide (MILK OF MAGNESIA) suspension 30 mL  30 mL Oral Daily PRN Caroleen Hamman, NP      . nicotine (NICODERM CQ - dosed in mg/24 hours) patch 21 mg  21 mg Transdermal Q0600 Curlene Labrum Nialah Saravia, MD   21 mg at 09/21/12 7829  . OLANZapine zydis (ZYPREXA) disintegrating tablet 20 mg  20 mg Oral QHS Curlene Labrum Ianna Salmela, MD   20 mg at 09/20/12 2159  . OLANZapine zydis (ZYPREXA) disintegrating tablet 5 mg  5 mg Oral Q4H PRN Mickie D. Adams, PA   5 mg at 09/20/12 1648  . traZODone (DESYREL) tablet 50 mg  50 mg Oral QHS PRN Caroleen Hamman, NP   50 mg at 09/20/12  2159  . witch hazel-glycerin (TUCKS) pad   Topical PRN Verne Spurr, PA-C      . DISCONTD: carbamazepine (TEGRETOL XR) 12 hr tablet 400 mg  400 mg Oral QHS Curlene Labrum Amarissa Koerner, MD      . DISCONTD: OLANZapine zydis (ZYPREXA) disintegrating tablet 10 mg  10 mg Oral QHS Curlene Labrum Daleysa Kristiansen, MD   10 mg at 09/19/12 2110   Lab Results: No results found for this or any previous visit (from the past 48 hour(s)).  Physical Findings: AIMS: Facial and Oral Movements Muscles of Facial Expression: None, normal Lips and Perioral Area: None, normal Jaw: None, normal Tongue: None, normal,Extremity  Movements Upper (arms, wrists, hands, fingers): None, normal Lower (legs, knees, ankles, toes): None, normal, Trunk Movements Neck, shoulders, hips: None, normal, Overall Severity Severity of abnormal movements (highest score from questions above): None, normal Incapacitation due to abnormal movements: None, normal Patient's awareness of abnormal movements (rate only patient's report): No Awareness, Dental Status Current problems with teeth and/or dentures?: No Does patient usually wear dentures?: No   Review of Systems:  Neurological: No headaches, seizures or dizziness reported.  G.I.: The patient denies any constipation or stomach upset today.  Musculoskeletal: The patient denies any musculoskeletal related issues.   Time was spent today discussing with the patient his current symptoms. The patient reports to sleeping well but according to staff he slept approximately 1.25 hours.  The patient reports to eating well and denies any significant feelings of sadness, anhedonia or depressed mood.  The patient adamantly denies any suicidal or homicidal ideations as well as any significant anxiety symptoms.  He also denies any auditory or visual hallucinations or delusional thinking but continues to display mild paranoid behaviors.  He denies any medication related side effects and is requesting discharge as soon as possible.  Treatment Plan Summary:  1. Daily contact with patient to assess and evaluate symptoms and progress in treatment.  2. Medication management  3. The patient will deny suicidal ideations or homicidal ideations for 48 hours prior to discharge and have a depression and anxiety rating of 3 or less. The patient will also deny any auditory or visual hallucinations or delusional thinking.  4. The patient will deny any symptoms of substance withdrawal at time of discharge.   Plan:  1. Will continue the patient on the medication Zyprexa at 20 mgs po qhs for psychosis and for mood  stabilization.  2. Will continue the patient on the medication Tegretol XR 400 mgs po qhs for mood stabilization. 3. Will continue the patient on the medication HCTZ at 12.5 mgs po q am for blood pressure control.  4. Laboratory Studies reviewed.  5. Will order a TSH, Free T3, Free T4 and Serum Tegretol Level for this evening. 6. Will continue to monitor.  7. Possible discharge in the morning.  Abrahim Sargent 09/21/2012, 12:19 PM

## 2012-09-21 NOTE — Progress Notes (Signed)
Patient's mood has improved today; he is not as angry today.  He expressed his desire to return home to his brother's house.  He denies any SI/HI/AVH.  He may be a possible discharge tomorrow.  He is cooperative with staff and interacting well.  Continue to monitor medication management and MD orders.  Safety checks completed every 15 minutes per protocol. Encourage patient to continue to attend groups and participate in his treatment.

## 2012-09-22 LAB — T4, FREE: Free T4: 1.13 ng/dL (ref 0.80–1.80)

## 2012-09-22 LAB — TSH: TSH: 1.329 u[IU]/mL (ref 0.350–4.500)

## 2012-09-22 MED ORDER — HYDROCHLOROTHIAZIDE 12.5 MG PO CAPS: 12.5000 mg | ORAL_CAPSULE | Freq: Every day | ORAL | Status: DC

## 2012-09-22 MED ORDER — CARBAMAZEPINE ER 400 MG PO TB12: 400.0000 mg | ORAL_TABLET | Freq: Every day | ORAL | Status: DC

## 2012-09-22 MED ORDER — HYDROCORTISONE ACE-PRAMOXINE 1-1 % RE FOAM: 1.0000 | Freq: Four times a day (QID) | RECTAL | Status: DC

## 2012-09-22 MED ORDER — TRAZODONE HCL 50 MG PO TABS: 50.0000 mg | ORAL_TABLET | Freq: Every evening | ORAL | Status: DC | PRN

## 2012-09-22 MED ORDER — OLANZAPINE 20 MG PO TBDP: 20.0000 mg | ORAL_TABLET | Freq: Every day | ORAL | Status: DC

## 2012-09-22 NOTE — Discharge Summary (Signed)
Physician Discharge Summary Note  Patient:  Lucas Knapp is an 26 y.o., male MRN:  [id number] DOB:  1986/10/18 Patient phone:  [phone] (home)  Patient address:   [address] [place] [address]   Date of Admission:  09/17/2012 Date of Discharge:  10/2/013  Discharge Diagnoses: Principal Problem:  *Schizophrenia, paranoid type Active Problems:  Cannabis abuse  Axis Diagnosis:  AXIS I:   Schizophrenia - Paranoid Type.    Cannabis Abuse.  AXIS II:   Deferred. AXIS III:   1. Back Pain.   2. Hypertension. AXIS IV:   Chronic Mental Illness.  Possible Non-compliance with medications.  Unemployment. AXIS V:   GAF at time of admission approximately 35.  GAF at time of discharge approximately 55.  Level of Care:  Inpatient Hospitalization.  Hospital Course:   The patient attended treatment team meeting this am and met with treatment team members. The patient's symptoms, treatment plan and response to treatment was discussed. The patient endorsed that their symptoms have improved. The patient also stated that they felt stable for discharge.  They reported that from this hospital stay they had learned many coping skills.  In other to maintain their psychiatric stability, they will continue psychiatric care on an outpatient basis. They will follow-up as outlined below.  In addition they were instructed  to take all your medications as prescribed by their mental healthcare provider and to report any adverse effects and or reactions from your medicines to their outpatient provider promptly.  The patient is also instructed and cautioned to not engage in alcohol and or illegal drug use while on prescription medicines.  In the event of worsening symptoms the patient is instructed to call the crisis hotline, 911 and or go to the nearest ED for appropriate evaluation and treatment of symptoms.   Also while a patient in this hospital, the patient received medication management for  his psychiatric symptoms. They were ordered and received as outlined below:     Medication List     As of 09/22/2012 11:28 AM    STOP taking these medications         ibuprofen 800 MG tablet   Commonly known as: ADVIL,MOTRIN      OLANZapine 5 MG tablet   Commonly known as: ZYPREXA      oxyCODONE-acetaminophen 5-325 MG per tablet   Commonly known as: PERCOCET/ROXICET      TAKE these medications      Indication    carbamazepine 400 MG 12 hr tablet   Commonly known as: TEGRETOL XR   Take 1 tablet (400 mg total) by mouth at bedtime. For mood stabilization.       hydrochlorothiazide 12.5 MG capsule   Commonly known as: MICROZIDE   Take 1 capsule (12.5 mg total) by mouth daily. For blood pressure control.       hydrocortisone-pramoxine rectal foam   Commonly known as: PROCTOFOAM-HC   Place 1 applicator rectally 4 (four) times daily. For hemorrhoid pain.       OLANZapine zydis 20 MG disintegrating tablet   Commonly known as: ZYPREXA   Take 1 tablet (20 mg total) by mouth at bedtime. For mood stabilization and clarity of thought.       traZODone 50 MG tablet   Commonly known as: DESYREL   Take 1 tablet (50 mg total) by mouth at bedtime as needed for sleep (may repeat x 1).        They were also enrolled in  group counseling sessions and activities in which they participated actively.       Follow-up Information    Follow up with [name]. (Walk in on Friday between 8 and 9AM for your hospital follow up appointment.  This is where you will see a Dr for your medication until you get back with [facility])    Contact information:   [address]  [place]  [336] [phone]      Follow up with [facility]. (They will be able to work with you when you have finished your application for [place] Medicaid.  Call Tiffany at this number if you need help)    Contact information:   [phone]        Upon discharge, patient adamantly denies suicidal, homicidal ideations,  auditory, visual hallucinations and or delusional thinking. They left [facility] with all personal belongings via public transportation in no apparent distress.  Consults:  Please see electronic medical record for details.  Significant Diagnostic Studies:  Please see electronic medical record for details.  Discharge Vitals:   Blood pressure 125/82, pulse 88, temperature 97 F (36.1 C), temperature source Oral, resp. rate 16, height 5\' 4"  (1.626 m), weight 91.173 kg (201 lb)..  Mental Status Exam: Demographic Factors:  Male, Low socioeconomic status, Living alone and Unemployed  Mental Status Per Nursing Assessment::   On Admission:  NA At time of Discharge:  Time was spent today discussing with the patient his current symptoms. The patient reports to sleeping well stating it is the "best night sleep" I have had since admission. The patient reports to eating well and denies any significant feelings of sadness, anhedonia or depressed mood. The patient adamantly denies any suicidal or homicidal ideations as well as any significant anxiety symptoms. He also denies any auditory or visual hallucinations or delusional thinking and does not show any paranoid behaviors today. He denies any medication related side effects or other concerns today.  The patient is asking for discharge today to outpatient follow up and this will be ordered.   Current Mental Status Per Physician:  Diagnosis:  Axis I:  Schizophrenia - Paranoid Type.   Cannabis Abuse.   The patient was seen today and reports the following:   ADL's: Intact.  Sleep: The patient reports to sleeping very well last night without difficulty. Appetite: The patient reports that his appetite is good today.   Mild>(1-10) >Severe  Hopelessness (1-10): 0  Depression (1-10): 0  Anxiety (1-10): 0   Suicidal Ideation: The patient adamantly denies any current suicidal ideations.  Plan: No  Intent: No  Means: No   Homicidal Ideation: The patient  adamantly denies any homicidal ideations today.  Plan: No  Intent: No.  Means: No   General Appearance/Behavior: The patient was friendly and cooperative with this provider today with no concerns.  Eye Contact: Good.  Speech: Appropriate in rate and volume with no pressuring noted.  Motor Behavior: wnl.  Level of Consciousness: Alert and Oriented x 3.  Mental Status: Alert and Oriented x 3.  Mood: Appears essentially euthymic.  Affect: Appears essentially bright and full.  Anxiety Level: No anxiety reported today.  Thought Process: The patient denies any auditory or visual hallucinations today or delusional thinking.  Thought Content: The patient denies any auditory or visual hallucinations today or delusional thinking.  Perception: The patient denies any auditory or visual hallucinations today or delusional thinking.  Judgment: Fair to Good.  Insight: Fair to Good.  Cognition: Oriented  to person, place and time.   Current Medications:     . carbamazepine  400 mg Oral QHS  . hydrochlorothiazide  12.5 mg Oral Daily  . hydrocortisone-pramoxine  1 applicator Rectal QID  . nicotine  21 mg Transdermal Q0600  . OLANZapine zydis  20 mg Oral QHS   Loss Factors: No recent losses reported.  Historical Factors: Long history of mental illness.    Risk Reduction Factors:   Sense of responsibility to family, Religious beliefs about death, Living with another person, especially a relative, Positive social support and Positive therapeutic relationship  Continued Clinical Symptoms:  Alcohol/Substance Abuse/Dependencies Schizophrenia:   Paranoid or undifferentiated type More than one psychiatric diagnosis Previous Psychiatric Diagnoses and Treatments  Discharge Diagnoses:   AXIS I:   Schizophrenia - Paranoid Type.    Cannabis Abuse.  AXIS II:   Deferred. AXIS III:   1. Back Pain.   2. Hypertension. AXIS IV:   Chronic Mental Illness.  Possible Non-compliance with medications.   Unemployment. AXIS V:   GAF at time of admission approximately 35.  GAF at time of discharge approximately 55.  Cognitive Features That Contribute To Risk:  None Noted.   Physical Findings:  AIMS: Facial and Oral Movements  Muscles of Facial Expression: None, normal  Lips and Perioral Area: None, normal  Jaw: None, normal  Tongue: None, normal,Extremity Movements  Upper (arms, wrists, hands, fingers): None, normal  Lower (legs, knees, ankles, toes): None, normal, Trunk Movements  Neck, shoulders, hips: None, normal, Overall Severity  Severity of abnormal movements (highest score from questions above): None, normal  Incapacitation due to abnormal movements: None, normal  Patient's awareness of abnormal movements (rate only patient's report): No Awareness, Dental Status  Current problems with teeth and/or dentures?: No  Does patient usually wear dentures?: No   Review of Systems:  Neurological: No headaches, seizures or dizziness reported.  G.I.: The patient denies any constipation or stomach upset today.  Musculoskeletal: The patient denies any musculoskeletal related issues.   Time was spent today discussing with the patient his current symptoms. The patient reports to sleeping well stating it is the "best night sleep" I have had since admission. The patient reports to eating well and denies any significant feelings of sadness, anhedonia or depressed mood. The patient adamantly denies any suicidal or homicidal ideations as well as any significant anxiety symptoms. He also denies any auditory or visual hallucinations or delusional thinking and does not show any paranoid behaviors today. He denies any medication related side effects or other concerns today.  The patient is asking for discharge today to outpatient follow up and this will be ordered.   Treatment Plan Summary:  1. Daily contact with patient to assess and evaluate symptoms and progress in treatment.  2. Medication management    3. The patient will deny suicidal ideations or homicidal ideations for 48 hours prior to discharge and have a depression and anxiety rating of 3 or less. The patient will also deny any auditory or visual hallucinations or delusional thinking.  4. The patient will deny any symptoms of substance withdrawal at time of discharge.   Plan:  1. Will continue the patient on the medication Zyprexa at 20 mgs po qhs for psychosis and for mood stabilization.  2. Will continue the patient on the medication Tegretol XR 400 mgs po qhs for mood stabilization.  3. Will continue the patient on the medication HCTZ at 12.5 mgs po q am for blood pressure control.  4. Laboratory Studies reviewed.  5. Will continue to monitor.  6. The patient will be discharged today at this request to outpatient follow up.  Suicide Risk:  Minimal: No identifiable suicidal ideation.  Patients presenting with no risk factors but with morbid ruminations; may be classified as minimal risk based on the severity of the depressive symptoms  Plan Of Care/Follow-up recommendations:  Activity:  As tolerated. Diet:  Heart Healthy Diet. Other:  Please take all medications only as directed and keep all scheduled follow up appointments.  Please return to your local Emergency Room should you have any thoughts of harmning yourself or others.  Discharge destination:  Home  Is patient on multiple antipsychotic therapies at discharge:  No  Has Patient had three or more failed trials of antipsychotic monotherapy by history: N/A Recommended Plan for Multiple Antipsychotic Therapies: N/A  Discharge Orders    Future Appointments: Provider: Department: Dept Phone: Center:   09/23/2012 8:45 AM Theressa Millard, PT [facility] [phone] [facility]   12/27/2012 9:30 AM Sherrie George, MD [facility] [phone] None     Future Orders Please Complete By Expires   Diet - low sodium heart healthy      Increase activity slowly       Discharge instructions      Comments:   Please take all medications only as directed and keep all scheduled follow up appointments.  Please return to your local Emergency Room should you have any thoughts of harming yourself or others.       Medication List     As of 09/22/2012 11:28 AM    STOP taking these medications         ibuprofen 800 MG tablet   Commonly known as: ADVIL,MOTRIN      OLANZapine 5 MG tablet   Commonly known as: ZYPREXA      oxyCODONE-acetaminophen 5-325 MG per tablet   Commonly known as: PERCOCET/ROXICET      TAKE these medications      Indication    carbamazepine 400 MG 12 hr tablet   Commonly known as: TEGRETOL XR   Take 1 tablet (400 mg total) by mouth at bedtime. For mood stabilization.       hydrochlorothiazide 12.5 MG capsule   Commonly known as: MICROZIDE   Take 1 capsule (12.5 mg total) by mouth daily. For blood pressure control.       hydrocortisone-pramoxine rectal foam   Commonly known as: PROCTOFOAM-HC   Place 1 applicator rectally 4 (four) times daily. For hemorrhoid pain.       OLANZapine zydis 20 MG disintegrating tablet   Commonly known as: ZYPREXA   Take 1 tablet (20 mg total) by mouth at bedtime. For mood stabilization and clarity of thought.       traZODone 50 MG tablet   Commonly known as: DESYREL   Take 1 tablet (50 mg total) by mouth at bedtime as needed for sleep (may repeat x 1).            Follow-up Information    Follow up with [name]. (Walk in on Friday between 8 and 9AM for your hospital follow up appointment.  This is where you will see a Dr for your medication until you get back with [facility])    Contact information:   [address]  [place]  [336] [phone]      Follow up with [facility]. (They will be able to work with you when you  have finished your application for [place] Medicaid.  Call Tiffany at this number if you need help)    Contact information:   [phone]        Follow-up  recommendations:   Activities: Resume typical activities Diet: Resume typical diet Other: Follow up with outpatient provider and report any side effects to out patient prescriber.  Comments:  Take all your medications as prescribed by your mental healthcare provider. Report any adverse effects and or reactions from your medicines to your outpatient provider promptly. Patient is instructed and cautioned to not engage in alcohol and or illegal drug use while on prescription medicines. In the event of worsening symptoms, patient is instructed to call the crisis hotline, 911 and or go to the nearest ED for appropriate evaluation and treatment of symptoms. Follow-up with your primary care provider for your other medical issues, concerns and or health care needs.  Signed: Franchot Gallo 09/22/2012 11:28 AM

## 2012-09-22 NOTE — Progress Notes (Signed)
Pt to be D/Ced after lunch. Pt's affect bright, appropriate and seems happy to be going home. Pt denies SI/HI. Will go over discharge instructions with pt.

## 2012-09-22 NOTE — Progress Notes (Signed)
[facility] Case Management Discharge Plan:  Will you be returning to the same living situation after discharge: Yes,  living with brother At discharge, do you have transportation home?:Yes,  bus Do you have the ability to pay for your medications:Yes,  mental health    Release of information consent forms completed and in the chart;  Patient's signature needed at discharge.  Patient to Follow up at:  Follow-up Information    Follow up with [name]. (Walk in on Friday between 8 and 9AM for your hospital follow up appointment.  This is where you will see a Dr for your medication until you get back with [facility])    Contact information:   [address]  [place]  [336] [phone]      Follow up with [facility]. (They will be able to work with you when you have finished your application for [place] Medicaid.  Call Tiffany at this number if you need help)    Contact information:   [phone]         Patient denies SI/HI:   Yes,  yes    Safety Planning and Suicide Prevention discussed:  Yes,  yes  Barrier to discharge identified:No.  Summary and Recommendations:   Lucas Knapp, Lucas Knapp 09/22/2012, 11:10 AM

## 2012-09-22 NOTE — Tx Team (Addendum)
Interdisciplinary Treatment Plan Update (Adult)  Date: 09/22/2012  Time Reviewed: 9:55 AM   Progress in Treatment: Attending groups: Yes Participating in groups:  Yes Taking medication as prescribed: Yes Tolerating medication:  Yes Family/Significant othe contact made:  Yes with brother. Patient understands diagnosis:  Yes Discussing patient identified problems/goals with staff:  Yes Medical problems stabilized or resolved:  Yes Denies suicidal/homicidal ideation: Yes Issues/concerns per patient self-inventory:  None identified Other: N/A  New problem(s) identified: None Identified   Additional comments: Follow-up with [facility] walk-in clinic.  Estimated length of stay: Discharging today  Discharge Plan: SW is assessing for appropriate referrals.    Review of initial/current patient goals per problem list:   1.  Goal(s): Reduce depressive symptoms  Met:  Yes  As evidenced by: Reducing depression from a 10 to a 0 as reported by pt.   2.  Goal (s): Eliminate Suicidal Ideation  Met:  Yes  As evidenced by: Eliminate suicidal ideation.   3.  Goal(s): Reduce Psychosis  Met:  Yes  Target date: by discharge  As evidenced by: Reduce psychotic symptoms to baseline, as reported by pt.     Attendees: Patient:  Lucas Knapp 09/22/2012 9:51 AM   Family:     Physician:  Franchot Gallo, MD 09/22/2012 9:51 AM   Nursing:   Nestor Ramp RN 09/22/2012 9:51 AM  Case Manager:  Barrie Folk RN MS EdS 09/22/2012 9:51 AM   Counselor:  Veto Kemps, MT-BC 09/22/2012 9:51 AM   Other:  Trula Slade MSW Intern 09/22/2012 9:51 AM   Other:  Carolynn Comment RN 09/22/2012 9:50 AM   Other:  Kathlee Nations RN 09/22/2012 9:57 AM   Other:  Shelda Jakes PA 09/22/2012 9:57 AM    Scribe for Treatment Team:   Barrie Folk 09/22/2012 9:51 AM

## 2012-09-22 NOTE — BHH Suicide Risk Assessment (Signed)
Suicide Risk Assessment  Discharge Assessment     Demographic Factors:  Male, Low socioeconomic status, Living alone and Unemployed  Mental Status Per Nursing Assessment::   On Admission:  NA At time of Discharge:  Time was spent today discussing with the patient his current symptoms. The patient reports to sleeping well stating it is the "best night sleep" I have had since admission. The patient reports to eating well and denies any significant feelings of sadness, anhedonia or depressed mood. The patient adamantly denies any suicidal or homicidal ideations as well as any significant anxiety symptoms. He also denies any auditory or visual hallucinations or delusional thinking and does not show any paranoid behaviors today. He denies any medication related side effects or other concerns today.  The patient is asking for discharge today to outpatient follow up and this will be ordered.   Current Mental Status Per Physician:  Diagnosis:  Axis I:  Schizophrenia - Paranoid Type.   Cannabis Abuse.   The patient was seen today and reports the following:   ADL's: Intact.  Sleep: The patient reports to sleeping very well last night without difficulty. Appetite: The patient reports that his appetite is good today.   Mild>(1-10) >Severe  Hopelessness (1-10): 0  Depression (1-10): 0  Anxiety (1-10): 0   Suicidal Ideation: The patient adamantly denies any current suicidal ideations.  Plan: No  Intent: No  Means: No   Homicidal Ideation: The patient adamantly denies any homicidal ideations today.  Plan: No  Intent: No.  Means: No   General Appearance/Behavior: The patient was friendly and cooperative with this provider today with no concerns.  Eye Contact: Good.  Speech: Appropriate in rate and volume with no pressuring noted.  Motor Behavior: wnl.  Level of Consciousness: Alert and Oriented x 3.  Mental Status: Alert and Oriented x 3.  Mood: Appears essentially euthymic.  Affect:  Appears essentially bright and full.  Anxiety Level: No anxiety reported today.  Thought Process: The patient denies any auditory or visual hallucinations today or delusional thinking.  Thought Content: The patient denies any auditory or visual hallucinations today or delusional thinking.  Perception: The patient denies any auditory or visual hallucinations today or delusional thinking.  Judgment: Fair to Good.  Insight: Fair to Good.  Cognition: Oriented to person, place and time.   Current Medications:     . carbamazepine  400 mg Oral QHS  . hydrochlorothiazide  12.5 mg Oral Daily  . hydrocortisone-pramoxine  1 applicator Rectal QID  . nicotine  21 mg Transdermal Q0600  . OLANZapine zydis  20 mg Oral QHS   Loss Factors: No recent losses reported.  Historical Factors: Long history of mental illness.    Risk Reduction Factors:   Sense of responsibility to family, Religious beliefs about death, Living with another person, especially a relative, Positive social support and Positive therapeutic relationship  Continued Clinical Symptoms:  Alcohol/Substance Abuse/Dependencies Schizophrenia:   Paranoid or undifferentiated type More than one psychiatric diagnosis Previous Psychiatric Diagnoses and Treatments  Discharge Diagnoses:   AXIS I:   Schizophrenia - Paranoid Type.    Cannabis Abuse.  AXIS II:   Deferred. AXIS III:   1. Back Pain.   2. Hypertension. AXIS IV:   Chronic Mental Illness.  Possible Non-compliance with medications.  Unemployment. AXIS V:   GAF at time of admission approximately 35.  GAF at time of discharge approximately 55.  Cognitive Features That Contribute To Risk:  None Noted.   Physical Findings:  AIMS: Facial and Oral Movements  Muscles of Facial Expression: None, normal  Lips and Perioral Area: None, normal  Jaw: None, normal  Tongue: None, normal,Extremity Movements  Upper (arms, wrists, hands, fingers): None, normal  Lower (legs, knees,  ankles, toes): None, normal, Trunk Movements  Neck, shoulders, hips: None, normal, Overall Severity  Severity of abnormal movements (highest score from questions above): None, normal  Incapacitation due to abnormal movements: None, normal  Patient's awareness of abnormal movements (rate only patient's report): No Awareness, Dental Status  Current problems with teeth and/or dentures?: No  Does patient usually wear dentures?: No   Review of Systems:  Neurological: No headaches, seizures or dizziness reported.  G.I.: The patient denies any constipation or stomach upset today.  Musculoskeletal: The patient denies any musculoskeletal related issues.   Time was spent today discussing with the patient his current symptoms. The patient reports to sleeping well stating it is the "best night sleep" I have had since admission. The patient reports to eating well and denies any significant feelings of sadness, anhedonia or depressed mood. The patient adamantly denies any suicidal or homicidal ideations as well as any significant anxiety symptoms. He also denies any auditory or visual hallucinations or delusional thinking and does not show any paranoid behaviors today. He denies any medication related side effects or other concerns today.  The patient is asking for discharge today to outpatient follow up and this will be ordered.   Treatment Plan Summary:  1. Daily contact with patient to assess and evaluate symptoms and progress in treatment.  2. Medication management  3. The patient will deny suicidal ideations or homicidal ideations for 48 hours prior to discharge and have a depression and anxiety rating of 3 or less. The patient will also deny any auditory or visual hallucinations or delusional thinking.  4. The patient will deny any symptoms of substance withdrawal at time of discharge.   Plan:  1. Will continue the patient on the medication Zyprexa at 20 mgs po qhs for psychosis and for mood  stabilization.  2. Will continue the patient on the medication Tegretol XR 400 mgs po qhs for mood stabilization.  3. Will continue the patient on the medication HCTZ at 12.5 mgs po q am for blood pressure control.  4. Laboratory Studies reviewed.  5. Will continue to monitor.  6. The patient will be discharged today at this request to outpatient follow up.  Suicide Risk:  Minimal: No identifiable suicidal ideation.  Patients presenting with no risk factors but with morbid ruminations; may be classified as minimal risk based on the severity of the depressive symptoms  Plan Of Care/Follow-up recommendations:  Activity:  As tolerated. Diet:  Heart Healthy Diet. Other:  Please take all medications only as directed and keep all scheduled follow up appointments.  Please return to your local Emergency Room should you have any thoughts of harmning yourself or others.  Reather Steller 09/22/2012, 9:05 AM

## 2012-09-23 ENCOUNTER — Ambulatory Visit: Payer: Medicare Other | Admitting: Physical Therapy

## 2012-09-23 NOTE — Progress Notes (Addendum)
Patient Discharge Instructions:  After Visit Summary (AVS):   Faxed to:  09/23/2012 Discharge Summary Note:   Faxed to:  09/23/2012 Psychiatric Admission Assessment Note:   Faxed to:  09/23/2012 Suicide Risk Assessment - Discharge Assessment:   Faxed to:  09/23/2012 Faxed/Sent to the Next Level Care provider:  09/23/2012  Follow up @ [facility], faxed #: [phone] [facility] @ [phone]  Karleen Hampshire Brittini, 09/23/2012, 1:44 PM

## 2012-10-09 ENCOUNTER — Emergency Department (HOSPITAL_COMMUNITY)
Admission: EM | Admit: 2012-10-09 | Discharge: 2012-10-12 | Disposition: A | Discharge status: Other Acute Inpatient Hospital | Payer: Medicare Other | Attending: Emergency Medicine | Admitting: Emergency Medicine

## 2012-10-09 ENCOUNTER — Encounter (HOSPITAL_COMMUNITY): Payer: Self-pay | Admitting: Emergency Medicine

## 2012-10-09 DIAGNOSIS — I10 Essential (primary) hypertension: Secondary | ICD-10-CM | POA: Insufficient documentation

## 2012-10-09 DIAGNOSIS — Z79899 Other long term (current) drug therapy: Secondary | ICD-10-CM | POA: Insufficient documentation

## 2012-10-09 DIAGNOSIS — F200 Paranoid schizophrenia: Secondary | ICD-10-CM | POA: Insufficient documentation

## 2012-10-09 LAB — CBC
HCT: 46.5 % (ref 39.0–52.0)
Hemoglobin: 17.1 g/dL — ABNORMAL HIGH (ref 13.0–17.0)
MCHC: 36.8 g/dL — ABNORMAL HIGH (ref 30.0–36.0)

## 2012-10-09 LAB — COMPREHENSIVE METABOLIC PANEL
Alkaline Phosphatase: 69 U/L (ref 39–117)
BUN: 10 mg/dL (ref 6–23)
GFR calc Af Amer: >90 mL/min (ref >90)
Glucose, Bld: 88 mg/dL (ref 70–99)
Potassium: 3.6 mEq/L (ref 3.5–5.1)
Total Bilirubin: 0.4 mg/dL (ref 0.3–1.2)
Total Protein: 7.7 g/dL (ref 6.0–8.3)

## 2012-10-09 LAB — ACETAMINOPHEN LEVEL: Acetaminophen (Tylenol), Serum: <15 ug/mL (ref 10–30)

## 2012-10-09 LAB — ETHANOL: Alcohol, Ethyl (B): <11 mg/dL (ref 0–11)

## 2012-10-09 MED ORDER — HYDROCHLOROTHIAZIDE 12.5 MG PO CAPS
12.5000 mg | ORAL_CAPSULE | Freq: Every day | ORAL | Status: DC
  Administered 2012-10-10 – 2012-10-12 (×3): 12.5 mg via ORAL
  Filled 2012-10-09 (×3): qty 1

## 2012-10-09 MED ORDER — CARBAMAZEPINE ER 400 MG PO TB12
400.0000 mg | ORAL_TABLET | Freq: Every day | ORAL | Status: DC
  Administered 2012-10-09: 400 mg via ORAL
  Filled 2012-10-09 (×2): qty 1

## 2012-10-09 MED ORDER — TRAZODONE HCL 50 MG PO TABS
50.0000 mg | ORAL_TABLET | Freq: Every evening | ORAL | Status: DC | PRN
  Administered 2012-10-10 – 2012-10-11 (×2): 50 mg via ORAL
  Filled 2012-10-09 (×2): qty 1

## 2012-10-09 MED ORDER — OLANZAPINE 10 MG PO TBDP
20.0000 mg | ORAL_TABLET | Freq: Every day | ORAL | Status: DC
  Administered 2012-10-09 – 2012-10-11 (×3): 20 mg via ORAL
  Filled 2012-10-09 (×3): qty 2

## 2012-10-09 NOTE — BHH Counselor (Signed)
Magistrate Orson Aloe confirmed receipt of IVC paperwork. Originals placed in IVC log and copies put in pt's chart.

## 2012-10-09 NOTE — ED Notes (Signed)
Pt changed into blue paper scrubs. Pt searched and wanded by security. Pt belongings, 2 bags, at nurses station by Rm15.

## 2012-10-09 NOTE — ED Provider Notes (Signed)
History     CSN: [id number]  Arrival date & time 10/09/12  2146   First MD Initiated Contact with Patient 10/09/12 2202      Chief Complaint  Patient presents with  . Hallucinations   5 caveat due to psychiatric disorder. A (Consider location/radiation/quality/duration/timing/severity/associated sxs/prior treatment) The history is provided by the patient.   patient is here for psychiatric problems. He states that she was implanted during colonoscopy and that his electrons are being monitored. Previous history of hallucinations and was recently in behavioral health hospital.  Past Medical History  Diagnosis Date  . Back pain   . Hypertension     pt has been prescribed HCTZ 12.5 mg daily. Pt was 140/80 on admission.     No past surgical history on file.  No family history on file.  History  Substance Use Topics  . Smoking status: Not on file  . Smokeless tobacco: Not on file  . Alcohol Use:       Review of Systems  Unable to perform ROS   Allergies  Geodon  Home Medications   Current Outpatient Rx  Name Route Sig Dispense Refill  . CARBAMAZEPINE ER 400 MG PO TB12 Oral Take 1 tablet (400 mg total) by mouth at bedtime. For mood stabilization. 30 tablet 0  . HYDROCHLOROTHIAZIDE 12.5 MG PO CAPS Oral Take 1 capsule (12.5 mg total) by mouth daily. For blood pressure control. 30 capsule 0  . OLANZAPINE 20 MG PO TBDP Oral Take 1 tablet (20 mg total) by mouth at bedtime. For mood stabilization and clarity of thought. 30 tablet 0  . TRAZODONE HCL 50 MG PO TABS Oral Take 1 tablet (50 mg total) by mouth at bedtime as needed for sleep (may repeat x 1). 60 tablet 0    BP 137/106  Pulse 112  Temp 98.6 F (37 C)  Resp 18  SpO2 99%  Physical Exam  Constitutional: He appears well-developed and well-nourished.  HENT:  Head: Normocephalic.  Cardiovascular: Normal rate.   Pulmonary/Chest: Effort normal.  Abdominal: Soft.  Musculoskeletal: Normal range of motion.    Neurological: He is alert.  Psychiatric:       Patient is psychotic    ED Course  Procedures (including critical care time)   Labs Reviewed  ACETAMINOPHEN LEVEL  CBC  COMPREHENSIVE METABOLIC PANEL  ETHANOL  SALICYLATE LEVEL  URINE RAPID DRUG SCREEN (HOSP PERFORMED)   No results found.   No diagnosis found.    MDM  Patient with psychosis. He's been involuntarily committed. Medical clearance labs are pending.        Juliet Rude. Rubin Payor, MD 10/11/12 8469

## 2012-10-09 NOTE — ED Notes (Signed)
Pt in room with [employer] and NT at bedside. Currently, cooperative. Pt stated he had a bottle of wine but denied any other drug or ETOH usage. Pt rambling in thoughts and speech.

## 2012-10-09 NOTE — ED Notes (Signed)
Pt sts that the last time he was here something was implanted during a colonoscopy.pt sts the police can look that the probe that was implanted form RF micro and special freight services are working together to take electrons form him while "[place] light speed".

## 2012-10-10 LAB — CARBAMAZEPINE LEVEL, TOTAL: Carbamazepine Lvl: <0.5 ug/mL — ABNORMAL LOW (ref 4.0–12.0)

## 2012-10-10 LAB — RAPID URINE DRUG SCREEN, HOSP PERFORMED
Barbiturates: NOT DETECTED
Cocaine: NOT DETECTED

## 2012-10-10 MED ORDER — CARBAMAZEPINE 200 MG PO TABS
200.0000 mg | ORAL_TABLET | Freq: Two times a day (BID) | ORAL | Status: DC
  Administered 2012-10-10 – 2012-10-12 (×5): 200 mg via ORAL
  Filled 2012-10-10 (×6): qty 1

## 2012-10-10 NOTE — ED Notes (Signed)
Pt at nursing station using the phone

## 2012-10-10 NOTE — ED Notes (Signed)
Report given to Psych ED RN. 

## 2012-10-10 NOTE — ED Notes (Signed)
Pt up to bathroom.

## 2012-10-10 NOTE — ED Provider Notes (Addendum)
Filed Vitals:   10/10/12 0626  BP: 114/81  Pulse: 75  Temp: 97.8 F (36.6 C)  Resp: 18   Pt seen and assessed. Says he doesn't need to be here and that if I checked his "electron configuration" that I would know that. Telepsych in progress.   Raeford Razor, MD 10/10/12 0809  8:24 AM Telepsych recommending inpatient.   Raeford Razor, MD 10/10/12 [phone]
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
## 2012-10-10 NOTE — ED Notes (Signed)
Pt up using the phone 
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
## 2012-10-10 NOTE — BHH Counselor (Signed)
Thornell Sartorius, assessment counselor at [facility], submitted Pt for admission to [facility]. Consulted with Rosey Bath, [facility] who confirmed there is not an appropriate bed available at this time. Verne Spurr, PA reviewed clinical information and accepted Pt pending a 400-hall bed availability. Communicated this information to Albesa Seen, assessment counselor at [facility].  Harlin Rain Patsy [place], LPC, NCC

## 2012-10-11 MED ORDER — ALUM & MAG HYDROXIDE-SIMETH 200-200-20 MG/5ML PO SUSP
30.0000 mL | Freq: Once | ORAL | Status: AC
  Administered 2012-10-11: 30 mL via ORAL
  Filled 2012-10-11: qty 30

## 2012-10-11 NOTE — ED Provider Notes (Signed)
0725.  Pt resting in bed, easily aroused, NAD.  Voices no complaints.  Note stable VS.  No events reported overnight.  Pt has been accepted to [facility] and is awaiting a bed to become available.  Tobin Chad, MD 10/11/12 [phone]

## 2012-10-12 NOTE — ED Notes (Signed)
[place] Sheriff here to transport pt to [facility]. 

## 2012-10-12 NOTE — ED Provider Notes (Signed)
No new c/o  Cylinda Santoli, MD 10/12/12 0857 

## 2012-10-12 NOTE — BHH Counselor (Signed)
Patient accepted to [facility] by Dr. Robet Leu, per Apolonio Schneiders. The call report # is [phone]. Writer made patients nurse aware of patients disposition. Also, contacted Dr. Adriana Simas and made him aware of patients disposition. Patient pending transport to the facility via sheriff.

## 2012-10-12 NOTE — ED Notes (Signed)
3 personal belonging bags removed from locker #41 & sent with [facility].

## 2012-10-12 NOTE — ED Notes (Signed)
Pt states "do you all have radios on the unit"; informed pt there is music channels on the TV but do not know what channels, pt then stated "they're doing something at the TV station."

## 2012-10-12 NOTE — ED Notes (Signed)
Informed by [facility] transport not available until late this afternoon.

## 2012-12-27 ENCOUNTER — Ambulatory Visit (INDEPENDENT_AMBULATORY_CARE_PROVIDER_SITE_OTHER): Payer: Medicare Other | Admitting: Ophthalmology

## 2013-01-31 ENCOUNTER — Ambulatory Visit (INDEPENDENT_AMBULATORY_CARE_PROVIDER_SITE_OTHER): Payer: Medicare Other | Admitting: Ophthalmology

## 2013-01-31 DIAGNOSIS — H33309 Unspecified retinal break, unspecified eye: Secondary | ICD-10-CM

## 2013-01-31 DIAGNOSIS — H43819 Vitreous degeneration, unspecified eye: Secondary | ICD-10-CM

## 2013-01-31 DIAGNOSIS — H5210 Myopia, unspecified eye: Secondary | ICD-10-CM

## 2013-08-11 DIAGNOSIS — G8929 Other chronic pain: Secondary | ICD-10-CM | POA: Insufficient documentation

## 2013-10-13 DIAGNOSIS — F17200 Nicotine dependence, unspecified, uncomplicated: Secondary | ICD-10-CM | POA: Diagnosis present

## 2013-10-13 DIAGNOSIS — E785 Hyperlipidemia, unspecified: Secondary | ICD-10-CM | POA: Insufficient documentation

## 2013-10-25 ENCOUNTER — Emergency Department (HOSPITAL_COMMUNITY)
Admission: EM | Admit: 2013-10-25 | Discharge: 2013-10-25 | Disposition: A | Discharge status: Other Acute Inpatient Hospital | Payer: Medicare Other | Attending: Emergency Medicine | Admitting: Emergency Medicine

## 2013-10-25 ENCOUNTER — Encounter (HOSPITAL_COMMUNITY): Payer: Self-pay | Admitting: Emergency Medicine

## 2013-10-25 DIAGNOSIS — M545 Low back pain, unspecified: Secondary | ICD-10-CM | POA: Insufficient documentation

## 2013-10-25 DIAGNOSIS — I10 Essential (primary) hypertension: Secondary | ICD-10-CM | POA: Insufficient documentation

## 2013-10-25 DIAGNOSIS — F200 Paranoid schizophrenia: Secondary | ICD-10-CM

## 2013-10-25 DIAGNOSIS — F209 Schizophrenia, unspecified: Secondary | ICD-10-CM

## 2013-10-25 DIAGNOSIS — F911 Conduct disorder, childhood-onset type: Secondary | ICD-10-CM | POA: Insufficient documentation

## 2013-10-25 DIAGNOSIS — R1013 Epigastric pain: Secondary | ICD-10-CM | POA: Insufficient documentation

## 2013-10-25 DIAGNOSIS — Z79899 Other long term (current) drug therapy: Secondary | ICD-10-CM | POA: Insufficient documentation

## 2013-10-25 HISTORY — DX: Schizophrenia, unspecified: F20.9

## 2013-10-25 LAB — URINE MICROSCOPIC-ADD ON

## 2013-10-25 LAB — COMPREHENSIVE METABOLIC PANEL
ALT: 83 U/L — ABNORMAL HIGH (ref 0–53)
Alkaline Phosphatase: 84 U/L (ref 39–117)
CO2: 24 mEq/L (ref 19–32)
GFR calc Af Amer: >90 mL/min (ref >90)
GFR calc non Af Amer: 82 mL/min — ABNORMAL LOW (ref >90)
Glucose, Bld: 86 mg/dL (ref 70–99)
Potassium: 3.5 mEq/L (ref 3.5–5.1)
Sodium: 136 mEq/L (ref 135–145)
Total Protein: 8.5 g/dL — ABNORMAL HIGH (ref 6.0–8.3)

## 2013-10-25 LAB — CBC WITH DIFFERENTIAL/PLATELET
Basophils Relative: 0 % (ref 0–1)
Eosinophils Relative: 0 % (ref 0–5)
Hemoglobin: 18 g/dL — ABNORMAL HIGH (ref 13.0–17.0)
Lymphocytes Relative: 28 % (ref 12–46)
Neutrophils Relative %: 61 % (ref 43–77)
RBC: 5.67 MIL/uL (ref 4.22–5.81)
WBC: 7.8 10*3/uL (ref 4.0–10.5)

## 2013-10-25 LAB — URINALYSIS, ROUTINE W REFLEX MICROSCOPIC
Bilirubin Urine: NEGATIVE
Ketones, ur: 40 mg/dL — AB
Nitrite: NEGATIVE
pH: 6 (ref 5.0–8.0)

## 2013-10-25 LAB — ETHANOL: Alcohol, Ethyl (B): <11 mg/dL (ref 0–11)

## 2013-10-25 LAB — RAPID URINE DRUG SCREEN, HOSP PERFORMED
Barbiturates: NOT DETECTED
Benzodiazepines: NOT DETECTED
Tetrahydrocannabinol: POSITIVE — AB

## 2013-10-25 MED ORDER — LORAZEPAM 2 MG/ML IJ SOLN
2.0000 mg | Freq: Once | INTRAMUSCULAR | Status: AC
  Administered 2013-10-25: 2 mg via INTRAMUSCULAR
  Filled 2013-10-25: qty 1

## 2013-10-25 MED ORDER — OLANZAPINE 10 MG PO TBDP
20.0000 mg | ORAL_TABLET | Freq: Two times a day (BID) | ORAL | Status: DC
  Administered 2013-10-25: 20 mg via ORAL
  Filled 2013-10-25: qty 2
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
## 2013-10-25 NOTE — ED Notes (Signed)
Pt talking loudly in his room, responding to internal stimuli.  Pt talking about how it's "hard out here in these streets", "let me get some of them drugs", "you gonna give me a gun?", "setting it off in this mother fucker", etc.  Pt redirected but quickly goes back to rambling.

## 2013-10-25 NOTE — ED Provider Notes (Signed)
CSN: [id number]     Arrival date & time 10/25/13  0136 History   First MD Initiated Contact with Patient 10/25/13 0138     Chief Complaint  Patient presents with  . Medical Clearance   (Consider location/radiation/quality/duration/timing/severity/associated sxs/prior Treatment) HPI Patient found by neighbors lying in the parking lot. Police was called. Police state the patient was awake but would not respond. EMS was then called to transport patient to the emergency department. Per EMS patient was talkative in the back of the ambulance truck. States he lives with a twin brother. Patient denies having any suicidal or homicidal ideation. He denies auditory or visual hallucinations. He complains of a cut on his finger and is asking for a Band-Aid. He complains of vague epigastric and low back pain. This is not acute onset. He's had no nausea vomiting diarrhea. He has no melena. Patient does drink alcohol frequently. Past Medical History  Diagnosis Date  . Back pain   . Hypertension     pt has been prescribed HCTZ 12.5 mg daily. Pt was 140/80 on admission.   . Schizophrenia    History reviewed. No pertinent past surgical history. No family history on file. History  Substance Use Topics  . Smoking status: Not on file  . Smokeless tobacco: Not on file  . Alcohol Use: Not on file    Review of Systems  Constitutional: Negative for fever and chills.  Respiratory: Negative for shortness of breath.   Cardiovascular: Negative for chest pain.  Gastrointestinal: Positive for abdominal pain. Negative for nausea, vomiting, diarrhea and blood in stool.  Genitourinary: Negative for dysuria, frequency, hematuria and flank pain.  Musculoskeletal: Positive for back pain and myalgias. Negative for neck pain and neck stiffness.  Skin: Negative for rash and wound.  Neurological: Negative for dizziness, weakness, light-headedness, numbness and headaches.  All other systems reviewed and are  negative.    Allergies  Geodon  Home Medications   Current Outpatient Rx  Name  Route  Sig  Dispense  Refill  . carbamazepine (TEGRETOL XR) 400 MG 12 hr tablet   Oral   Take 1 tablet (400 mg total) by mouth at bedtime. For mood stabilization.   30 tablet   0   . hydrochlorothiazide (MICROZIDE) 12.5 MG capsule   Oral   Take 1 capsule (12.5 mg total) by mouth daily. For blood pressure control.   30 capsule   0   . OLANZapine zydis (ZYPREXA) 20 MG disintegrating tablet   Oral   Take 1 tablet (20 mg total) by mouth at bedtime. For mood stabilization and clarity of thought.   30 tablet   0   . traZODone (DESYREL) 50 MG tablet   Oral   Take 1 tablet (50 mg total) by mouth at bedtime as needed for sleep (may repeat x 1).   60 tablet   0    There were no vitals taken for this visit. Physical Exam  Nursing note and vitals reviewed. Constitutional: He is oriented to person, place, and time. He appears well-developed and well-nourished. No distress.  HENT:  Head: Normocephalic and atraumatic.  Mouth/Throat: Oropharynx is clear and moist.  Eyes: EOM are normal. Pupils are equal, round, and reactive to light.  Neck: Normal range of motion. Neck supple.  Cardiovascular: Normal rate and regular rhythm.   Pulmonary/Chest: Effort normal and breath sounds normal. No respiratory distress. He has no wheezes. He has no rales.  Abdominal: Soft. Bowel sounds are normal. He exhibits no distension  and no mass. There is tenderness (mild epigastric tenderness to palpation.). There is no rebound and no guarding.  Musculoskeletal: Normal range of motion. He exhibits no edema and no tenderness.  Patient has mild to palpation over his left lumbar paraspinal muscles. No midline tenderness or step-offs.  Neurological: He is alert and oriented to person, place, and time.  Patient has 5/5 motor in all extremities. Sensation is intact to light touch. .   Skin: Skin is warm and dry. No rash noted. No  erythema.  Psychiatric: He has a normal mood and affect. His behavior is normal.  No SI or HI. Denies hallucinations. Pressured speech and tangential thought pattern.    ED Course  Procedures (including critical care time) Labs Review Labs Reviewed  CBC WITH DIFFERENTIAL  COMPREHENSIVE METABOLIC PANEL  URINALYSIS, ROUTINE W REFLEX MICROSCOPIC  URINE RAPID DRUG SCREEN (HOSP PERFORMED)  ETHANOL   Imaging Review No results found.  EKG Interpretation     Ventricular Rate:  111 PR Interval:  126 QRS Duration: 98 QT Interval:  315 QTC Calculation: 428 R Axis:   32 Text Interpretation:  Sinus tachycardia RSR' in V1 or V2, right VCD or RVH ST elev, probable normal early repol pattern            MDM   Patient with frequent verbal outbursts in the emergency department requiring mild sedation. Patient is medically cleared for psychiatric evaluation.   Loren Racer, MD 10/26/13 [phone]

## 2013-10-25 NOTE — ED Notes (Signed)
1 langer of keys, 4 shirts placed in bag, black pair of shorts. One silver necklace, silver ring, and a yellow ring in bag. No shoes no cell phone no pants. Belongings placed in locker 32.

## 2013-10-25 NOTE — ED Notes (Signed)
Pt heart rate in 110-120's.  Pt rambling about different things.  Turned on TV to attempt to focus patient.

## 2013-10-25 NOTE — Progress Notes (Signed)
Writer informed the ED MD (Dr. Veleta Miners) that the patient has been assessed and he does meet criteria for inpatient hospitalization.  Writer has referred the patient to [facility], [facility], [facility] and [facility].

## 2013-10-25 NOTE — ED Notes (Signed)
Pt Manic. He is wondering the unit and needs frequent redirection. He does respond to redirection. MD aware

## 2013-10-25 NOTE — ED Notes (Signed)
Pt continues to yell out.  Rambling.  Meds ordered.  Disruptive to department.

## 2013-10-25 NOTE — Consult Note (Signed)
[facility] Face-to-Face Psychiatry Consult   Reason for Consult:  Psychotic thinking  Referring Physician:  ER MD  Lucas Knapp is an 27 y.o. male.  Assessment: AXIS I:  schizophrenia, paranoid type AXIS II:  Deferred AXIS III:   Past Medical History  Diagnosis Date  . Back pain   . Hypertension     pt has been prescribed HCTZ 12.5 mg daily. Pt was 140/80 on admission.   . Schizophrenia    AXIS IV:  economic problems and main problem is chronic mental illness AXIS V:  31-40 impairment in reality testing  Plan:  Recommend psychiatric Inpatient admission when medically cleared.  Subjective:   Lucas Knapp is a 27 y.o. male patient admitted with psychotic thinking.  HPI:  Mr Lucas Knapp has a long history of schizophrenia.  He says he has been taking medication but is not a reliable historian.  Last night he was picked up by the police lying in a parking lot talking to himself saying he was acting out a movie.  Today he believes there is electricity in his veins and that he is being controlled.  He believes the hospital is putting something into him even though there are no IV's in place.  He was talking to somebody in the corner of the room while he was talking to [place].  In the parking lot last night he said he was "electronically networking but they called it disturbing the peace"  He was blocking thoughts initially but when he recognized me from the past he talked more openly. HPI Elements:   Location:  ER. Quality:  psychotic. Severity:  severe. Timing:  no precipitants. Duration:  years. Context:  believes he is being controlled electronically.  Past Psychiatric History: Past Medical History  Diagnosis Date  . Back pain   . Hypertension     pt has been prescribed HCTZ 12.5 mg daily. Pt was 140/80 on admission.   . Schizophrenia     reports that he uses illicit drugs (Marijuana). His tobacco and alcohol histories are not on file. No family history on file. Family  History Substance Abuse: No Family Supports: Yes, List: (twin brother, and mother) Living Arrangements: Other relatives;Other (Comment) Can pt return to current living arrangement?: Yes   Allergies:   Allergies  Allergen Reactions  . Geodon [Ziprasidone Hcl]     Made his throat real dry per pt.    ACT Assessment Complete:  Yes:    Educational Status    Risk to Self: Risk to self Suicidal Ideation: No Suicidal Intent: No Is patient at risk for suicide?: No Suicidal Plan?: No Access to Means: No What has been your use of drugs/alcohol within the last 12 months?: none Previous Attempts/Gestures: No How many times?: 0 Other Self Harm Risks: none Triggers for Past Attempts: None known Intentional Self Injurious Behavior: None Family Suicide History: No Recent stressful life event(s): Other (Comment) (uta) Persecutory voices/beliefs?: No Depression: No Substance abuse history and/or treatment for substance abuse?: No  Risk to Others: Risk to Others Homicidal Ideation: No Thoughts of Harm to Others: No Current Homicidal Intent: No Current Homicidal Plan: No Access to Homicidal Means: No Identified Victim: n/a History of harm to others?: No Assessment of Violence: None Noted Violent Behavior Description: none Does patient have access to weapons?: No Criminal Charges Pending?: No Does patient have a court date: No  Abuse:    Prior Inpatient Therapy: Prior Inpatient Therapy Prior Inpatient Therapy: Yes Prior Therapy Dates: 2014  [facility] Reason for Treatment: schizophrenia  Prior Outpatient Therapy: Prior Outpatient Therapy Prior Outpatient Therapy: Yes Prior Therapy Dates: ongoing Prior Therapy Facilty/Provider(s): [facility] Reason for Treatment: schizophrenia  Additional Information: Additional Information 1:1 In Past 12 Months?: No CIRT Risk: No Elopement Risk: No Does patient have medical clearance?: Yes                   Objective: Blood pressure 128/96, pulse 106, temperature 98.6 F (37 C), resp. rate 16, SpO2 97.00%.There is no weight on file to calculate BMI. Results for orders placed during the hospital encounter of 10/25/13 (from the past 72 hour(s))  CBC WITH DIFFERENTIAL     Status: Abnormal   Collection Time    10/25/13  2:05 AM      Result Value Range   WBC 7.8  4.0 - 10.5 K/uL   RBC 5.67  4.22 - 5.81 MIL/uL   Hemoglobin 18.0 (*) 13.0 - 17.0 g/dL   HCT 16.1  09.6 - 04.5 %   MCV 85.0  78.0 - 100.0 fL   MCH 31.7  26.0 - 34.0 pg   MCHC 37.3 (*) 30.0 - 36.0 g/dL   Comment: RULED OUT INTERFERING SUBSTANCES   RDW 11.9  11.5 - 15.5 %   Platelets 274  150 - 400 K/uL   Neutrophils Relative % 61  43 - 77 %   Lymphocytes Relative 28  12 - 46 %   Monocytes Relative 11  3 - 12 %   Eosinophils Relative 0  0 - 5 %   Basophils Relative 0  0 - 1 %   Neutro Abs 4.7  1.7 - 7.7 K/uL   Lymphs Abs 2.2  0.7 - 4.0 K/uL   Monocytes Absolute 0.9  0.1 - 1.0 K/uL   Eosinophils Absolute 0.0  0.0 - 0.7 K/uL   Basophils Absolute 0.0  0.0 - 0.1 K/uL   Smear Review MORPHOLOGY UNREMARKABLE    COMPREHENSIVE METABOLIC PANEL     Status: Abnormal   Collection Time    10/25/13  2:05 AM      Result Value Range   Sodium 136  135 - 145 mEq/L   Potassium 3.5  3.5 - 5.1 mEq/L   Chloride 94 (*) 96 - 112 mEq/L   CO2 24  19 - 32 mEq/L   Glucose, Bld 86  70 - 99 mg/dL   BUN 14  6 - 23 mg/dL   Creatinine, Ser 4.09  0.50 - 1.35 mg/dL   Calcium 81.1  8.4 - 91.4 mg/dL   Total Protein 8.5 (*) 6.0 - 8.3 g/dL   Albumin 5.1  3.5 - 5.2 g/dL   AST 782 (*) 0 - 37 U/L   ALT 83 (*) 0 - 53 U/L   Alkaline Phosphatase 84  39 - 117 U/L   Total Bilirubin 1.3 (*) 0.3 - 1.2 mg/dL   GFR calc non Af Amer 82 (*) >90 mL/min   GFR calc Af Amer >90  >90 mL/min   Comment: (NOTE)     The eGFR has been calculated using the CKD EPI equation.     This calculation has not been validated in all clinical situations.      eGFR's persistently <90 mL/min signify possible Chronic Kidney     Disease.  ETHANOL     Status: None   Collection Time    10/25/13  2:05 AM      Result Value Range   Alcohol, Ethyl (  B) <11  0 - 11 mg/dL   Comment:            LOWEST DETECTABLE LIMIT FOR     SERUM ALCOHOL IS 11 mg/dL     FOR MEDICAL PURPOSES ONLY   Labs are reviewed and are pertinent for no psychiatric issue.  Current Facility-Administered Medications  Medication Dose Route Frequency Provider Last Rate Last Dose  . OLANZapine zydis (ZYPREXA) disintegrating tablet 20 mg  20 mg Oral BID Benjaman Pott, MD   20 mg at 10/25/13 1610   Current Outpatient Prescriptions  Medication Sig Dispense Refill  . hydrochlorothiazide (MICROZIDE) 12.5 MG capsule Take 1 capsule (12.5 mg total) by mouth daily. For blood pressure control.  30 capsule  0  . carbamazepine (TEGRETOL XR) 400 MG 12 hr tablet Take 1 tablet (400 mg total) by mouth at bedtime. For mood stabilization.  30 tablet  0  . OLANZapine zydis (ZYPREXA) 20 MG disintegrating tablet Take 1 tablet (20 mg total) by mouth at bedtime. For mood stabilization and clarity of thought.  30 tablet  0  . traZODone (DESYREL) 50 MG tablet Take 1 tablet (50 mg total) by mouth at bedtime as needed for sleep (may repeat x 1).  60 tablet  0    Psychiatric Specialty Exam:     Blood pressure 128/96, pulse 106, temperature 98.6 F (37 C), resp. rate 16, SpO2 97.00%.There is no weight on file to calculate BMI.  General Appearance: Casual  Eye Contact::  Fair  Speech:  Slow  Volume:  Normal  Mood:  Anxious  Affect:  Flat  Thought Process:  Irrelevant  Orientation:  Other:  seems oriented but could not give the date  Thought Content:  Delusions, Hallucinations: Auditory Visual and Paranoid Ideation  Suicidal Thoughts:  No  Homicidal Thoughts:  No  Memory:  Immediate;   Poor Recent;   Poor Remote;   Poor  Judgement:  Impaired  Insight:  Lacking  Psychomotor Activity:  Normal   Concentration:  Fair  Recall:  Poor  Akathisia:  Negative  Handed:  Right  AIMS (if indicated):     Assets:  Physical Health  Sleep:      Treatment Plan Summary: Daily contact with patient to assess and evaluate symptoms and progress in treatment Medication management will need inpatient admission to help with the psychosis.  Has been accepted at [facility].  Will start Zyprexa Zydis 20 mg bid today.  Somalia Segler D 10/25/2013 10:01 AM

## 2013-10-25 NOTE — ED Notes (Addendum)
Pts belongings place in locker #42. Pt belongings were inventoried on hard copy of pt belongings sheet which will be located in pts shadow chart. Pt had silver color necklace, silver color ring, gold and maroon color ring (all located in bio hazard bag), black shorts, bright green watch, 3 bracelets(  1 yellow, 1 orange, 1 red and blue color), black gold shirt, maroon shirt, yellow shirt, off white long shirt, white and red lanyard, 9 keyes located on lanyard.  Pt was unable to sign pt belongings sheet at this time.

## 2013-10-25 NOTE — Progress Notes (Addendum)
Pt accepted to [facility] by Dr. Wendall Stade to room [address] [facility] [address]. RN can call repor tto [phone]. Pt transfer pending IVC papers. RN to call report when sheriff transportation available, and RN agreed.   Catha Gosselin, LCSW [phone]  ED CSW .10/25/2013 1032am

## 2013-10-25 NOTE — ED Notes (Signed)
Pt has a hx of schizo and this evening pt was lying in the parking lot and neighbors called the police. Pt states that he was acting out an a role in a movie. Denies si/hi and talks about random subjects. Brother said that pt has been like this since am. Mother said he has been to mental health multiple times. Her name is mildred and lives 3 hours away [phone]. Pt does have a twin brother

## 2014-05-02 DIAGNOSIS — G4700 Insomnia, unspecified: Secondary | ICD-10-CM

## 2014-05-02 HISTORY — DX: Insomnia, unspecified: G47.00

## 2014-08-22 DIAGNOSIS — E669 Obesity, unspecified: Secondary | ICD-10-CM | POA: Insufficient documentation

## 2014-11-24 ENCOUNTER — Encounter (HOSPITAL_COMMUNITY): Payer: Self-pay | Admitting: Emergency Medicine

## 2014-11-24 ENCOUNTER — Emergency Department (HOSPITAL_COMMUNITY): Payer: Medicare HMO

## 2014-11-24 ENCOUNTER — Emergency Department (HOSPITAL_COMMUNITY)
Admission: EM | Admit: 2014-11-24 | Discharge: 2014-11-24 | Disposition: A | Discharge status: Home / Self Care | Payer: Medicare HMO | Attending: Emergency Medicine | Admitting: Emergency Medicine

## 2014-11-24 DIAGNOSIS — Z720 Tobacco use: Secondary | ICD-10-CM | POA: Diagnosis not present

## 2014-11-24 DIAGNOSIS — F209 Schizophrenia, unspecified: Secondary | ICD-10-CM | POA: Diagnosis not present

## 2014-11-24 DIAGNOSIS — Z79899 Other long term (current) drug therapy: Secondary | ICD-10-CM | POA: Diagnosis not present

## 2014-11-24 DIAGNOSIS — R0600 Dyspnea, unspecified: Secondary | ICD-10-CM | POA: Diagnosis not present

## 2014-11-24 DIAGNOSIS — R000 Tachycardia, unspecified: Secondary | ICD-10-CM | POA: Diagnosis not present

## 2014-11-24 DIAGNOSIS — I10 Essential (primary) hypertension: Secondary | ICD-10-CM | POA: Diagnosis not present

## 2014-11-24 DIAGNOSIS — R059 Cough, unspecified: Secondary | ICD-10-CM

## 2014-11-24 DIAGNOSIS — R05 Cough: Secondary | ICD-10-CM | POA: Diagnosis not present

## 2014-11-24 LAB — CBC WITH DIFFERENTIAL/PLATELET
Basophils Absolute: 0 10*3/uL (ref 0.0–0.1)
Basophils Relative: 0 % (ref 0–1)
Eosinophils Absolute: 0.1 10*3/uL (ref 0.0–0.7)
Eosinophils Relative: 1 % (ref 0–5)
HCT: 45.7 % (ref 39.0–52.0)
Hemoglobin: 16.1 g/dL (ref 13.0–17.0)
Lymphocytes Relative: 44 % (ref 12–46)
Lymphs Abs: 4.4 10*3/uL — ABNORMAL HIGH (ref 0.7–4.0)
MCH: 31 pg (ref 26.0–34.0)
MCHC: 35.2 g/dL (ref 30.0–36.0)
MCV: 87.9 fL (ref 78.0–100.0)
Monocytes Absolute: 0.6 10*3/uL (ref 0.1–1.0)
Monocytes Relative: 6 % (ref 3–12)
Neutro Abs: 4.8 10*3/uL (ref 1.7–7.7)
Neutrophils Relative %: 49 % (ref 43–77)
Platelets: 292 10*3/uL (ref 150–400)
RBC: 5.2 MIL/uL (ref 4.22–5.81)
RDW: 12 % (ref 11.5–15.5)
WBC: 9.9 10*3/uL (ref 4.0–10.5)

## 2014-11-24 LAB — BASIC METABOLIC PANEL
Anion gap: 16 — ABNORMAL HIGH (ref 5–15)
BUN: 6 mg/dL (ref 6–23)
CO2: 23 mEq/L (ref 19–32)
Calcium: 10 mg/dL (ref 8.4–10.5)
Chloride: 100 mEq/L (ref 96–112)
Creatinine, Ser: 0.9 mg/dL (ref 0.50–1.35)
GFR calc Af Amer: >90 mL/min (ref >90)
GFR calc non Af Amer: >90 mL/min (ref >90)
Glucose, Bld: 114 mg/dL — ABNORMAL HIGH (ref 70–99)
Potassium: 4.2 mEq/L (ref 3.7–5.3)
Sodium: 139 mEq/L (ref 137–147)

## 2014-11-24 LAB — URINALYSIS, ROUTINE W REFLEX MICROSCOPIC
Bilirubin Urine: NEGATIVE
Glucose, UA: NEGATIVE mg/dL
Hgb urine dipstick: NEGATIVE
Ketones, ur: NEGATIVE mg/dL
Leukocytes, UA: NEGATIVE
Nitrite: NEGATIVE
Protein, ur: NEGATIVE mg/dL
Specific Gravity, Urine: 1.028 (ref 1.005–1.030)
Urobilinogen, UA: 0.2 mg/dL (ref 0.0–1.0)
pH: 6 (ref 5.0–8.0)

## 2014-11-24 LAB — CBG MONITORING, ED: Glucose-Capillary: 142 mg/dL — ABNORMAL HIGH (ref 70–99)

## 2014-11-24 LAB — PRO B NATRIURETIC PEPTIDE: Pro B Natriuretic peptide (BNP): 8.2 pg/mL (ref 0–125)

## 2014-11-24 LAB — TROPONIN I: Troponin I: <0.3 ng/mL (ref <0.30)

## 2014-11-24 MED ORDER — IOHEXOL 350 MG/ML SOLN
100.0000 mL | Freq: Once | INTRAVENOUS | Status: AC | PRN
  Administered 2014-11-24: 100 mL via INTRAVENOUS

## 2014-11-24 NOTE — ED Notes (Signed)
Patient transported to CT 

## 2014-11-24 NOTE — Discharge Instructions (Signed)
Cough, Adult  A cough is a reflex that helps clear your throat and airways. It can help heal the body or may be a reaction to an irritated airway. A cough may only last 2 or 3 weeks (acute) or may last more than 8 weeks (chronic).  CAUSES Acute cough:  Viral or bacterial infections. Chronic cough:  Infections.  Allergies.  Asthma.  Post-nasal drip.  Smoking.  Heartburn or acid reflux.  Some medicines.  Chronic lung problems (COPD).  Cancer. SYMPTOMS   Cough.  Fever.  Chest pain.  Increased breathing rate.  High-pitched whistling sound when breathing (wheezing).  Colored mucus that you cough up (sputum). TREATMENT   A bacterial cough may be treated with antibiotic medicine.  A viral cough must run its course and will not respond to antibiotics.  Your caregiver may recommend other treatments if you have a chronic cough. HOME CARE INSTRUCTIONS   Only take over-the-counter or prescription medicines for pain, discomfort, or fever as directed by your caregiver. Use cough suppressants only as directed by your caregiver.  Use a cold steam vaporizer or humidifier in your bedroom or home to help loosen secretions.  Sleep in a semi-upright position if your cough is worse at night.  Rest as needed.  Stop smoking if you smoke. SEEK IMMEDIATE MEDICAL CARE IF:   You have pus in your sputum.  Your cough starts to worsen.  You cannot control your cough with suppressants and are losing sleep.  You begin coughing up blood.  You have difficulty breathing.  You develop pain which is getting worse or is uncontrolled with medicine.  You have a fever. MAKE SURE YOU:   Understand these instructions.  Will watch your condition.  Will get help right away if you are not doing well or get worse. Document Released: 06/06/2011 Document Revised: 03/01/2012 Document Reviewed: 06/06/2011 Exit[employer] Patient Information 2015 [employer]. This information is not intended  to replace advice given to you by your health care provider. Make sure you discuss any questions you have with your health care provider.  

## 2014-11-24 NOTE — ED Notes (Signed)
Patient calling ride now, told I will take out to the waiting room to wait for ride once contacted.

## 2014-11-24 NOTE — ED Notes (Signed)
Pt denies pain or being SOB. Ambulatory to triage.

## 2014-11-24 NOTE — ED Provider Notes (Signed)
CSN: [id number]     Arrival date & time 11/24/14  1023 History   First MD Initiated Contact with Patient 11/24/14 1113     Chief Complaint  Patient presents with  . Cough  . Tachycardia     (Consider location/radiation/quality/duration/timing/severity/associated sxs/prior Treatment) HPI   28 year old male with cough and "not feeling right." Patient is not the greatest historian. He does have history of schizophrenia. Not sure how much of this may or may not be baseline. Cough is productive for thick brown sputum. Denies any gross blood. Feel short of breath and dizzy. Has symptoms at rest and worse with exertion. He denies any pain anywhere. Subjective fever. No nausea or vomiting. No diarrhea. No unusual leg swelling.   Past Medical History  Diagnosis Date  . Back pain   . Hypertension     pt has been prescribed HCTZ 12.5 mg daily. Pt was 140/80 on admission.   . Schizophrenia    History reviewed. No pertinent past surgical history. No family history on file. History  Substance Use Topics  . Smoking status: Current Every Day Smoker -- 1.00 packs/day    Types: Cigarettes  . Smokeless tobacco: Not on file  . Alcohol Use: No    Review of Systems  All systems reviewed and negative, other than as noted in HPI.   Allergies  Geodon  Home Medications   Prior to Admission medications   Medication Sig Start Date End Date Taking? Authorizing Provider  carbamazepine (TEGRETOL XR) 400 MG 12 hr tablet Take 1 tablet (400 mg total) by mouth at bedtime. For mood stabilization. 09/22/12   Curlene Labrumandy D Readling, MD  hydrochlorothiazide (MICROZIDE) 12.5 MG capsule Take 1 capsule (12.5 mg total) by mouth daily. For blood pressure control. 09/22/12   Curlene Labrumandy D Readling, MD  OLANZapine zydis (ZYPREXA) 20 MG disintegrating tablet Take 1 tablet (20 mg total) by mouth at bedtime. For mood stabilization and clarity of thought. 09/22/12   Ronny Baconandy D Readling, MD  traZODone (DESYREL) 50 MG tablet Take 1 tablet  (50 mg total) by mouth at bedtime as needed for sleep (may repeat x 1). 09/22/12   Curlene Labrumandy D Readling, MD   BP 151/88 mmHg  Pulse 144  Temp(Src) 98.8 F (37.1 C) (Oral)  Resp 22  SpO2 96% Physical Exam  Constitutional: He appears well-developed and well-nourished. No distress.  HENT:  Head: Normocephalic and atraumatic.  Eyes: Conjunctivae are normal. Right eye exhibits no discharge. Left eye exhibits no discharge.  Neck: Neck supple.  Cardiovascular: Regular rhythm and normal heart sounds.  Exam reveals no gallop and no friction rub.   No murmur heard. Tachycardia  Pulmonary/Chest: Effort normal and breath sounds normal. No respiratory distress.  Abdominal: Soft. He exhibits no distension. There is no tenderness.  Musculoskeletal: He exhibits no edema or tenderness.  Lower extremities symmetric as compared to each other. No calf tenderness. Negative Homan's. No palpable cords.   Neurological: He is alert.  Skin: Skin is warm and dry.  Psychiatric: He has a normal mood and affect. His behavior is normal. Thought content normal.  Nursing note and vitals reviewed.   ED Course  Procedures (including critical care time) Labs Review Labs Reviewed  CBC WITH DIFFERENTIAL - Abnormal; Notable for the following:    Lymphs Abs 4.4 (*)    All other components within normal limits  BASIC METABOLIC PANEL - Abnormal; Notable for the following:    Glucose, Bld 114 (*)    Anion gap 16 (*)  All other components within normal limits  CBG MONITORING, ED - Abnormal; Notable for the following:    Glucose-Capillary 142 (*)    All other components within normal limits  URINALYSIS, ROUTINE W REFLEX MICROSCOPIC  TROPONIN I  PRO B NATRIURETIC PEPTIDE    Imaging Review No results found.   Dg Chest 2 View  11/24/2014   CLINICAL DATA:  Chest pain and productive cough.  EXAM: CHEST  2 VIEW  COMPARISON:  None.  FINDINGS: Heart size and mediastinal contours are within normal limits. Both lungs are  clear. Visualized skeletal structures are unremarkable.  IMPRESSION: Negative chest.   Electronically Signed   By: Drusilla Kannerhomas  Dalessio M.D.   On: 11/24/2014 12:15   Ct Angio Chest Pe W/cm &/or Wo Cm  11/24/2014   CLINICAL DATA:  28 year old male with cough shortness of Breath. Initial encounter.  EXAM: CT ANGIOGRAPHY CHEST WITH CONTRAST  TECHNIQUE: Multidetector CT imaging of the chest was performed using the standard protocol during bolus administration of intravenous contrast. Multiplanar CT image reconstructions and MIPs were obtained to evaluate the vascular anatomy.  CONTRAST:  100mL OMNIPAQUE IOHEXOL 350 MG/ML SOLN  COMPARISON:  Chest radiographs 1149 hr the same day.  FINDINGS: Adequate contrast bolus timing in the pulmonary arterial tree. Respiratory motion artifact maximal in the left lower lobe. No focal filling defect identified in the pulmonary arterial tree to suggest the presence of acute pulmonary embolism.  Small volume retained secretions layering in the trachea just above the carina. Major airways otherwise are patent. Linear atelectasis in the left lower lobe.  Negative thoracic inlet. Negative visualized aorta and proximal great vessels. No pericardial or pleural effusion. No mediastinal or hilar lymphadenopathy. No axillary lymphadenopathy. Negative visualized liver, spleen, pancreas, left adrenal gland, right adrenal gland, left renal upper pole, and bowel in the upper abdomen.  *INSERT* bone suggestion of a chronic nondisplaced anterior left eighth rib fracture.  Review of the MIP images confirms the above findings.  IMPRESSION: 1.  No evidence of acute pulmonary embolus. 2. Small volume retained secretions in the trachea and minimal pulmonary atelectasis.   Electronically Signed   By: Augusto GambleLee  Hall M.D.   On: 11/24/2014 13:43   Dg Abd Acute W/chest  11/26/2014   CLINICAL DATA:  Initial evaluation for abdominal pain, patient cannot communicate exactly where or for all long, recent history of  cough 2 days ago which continues, family members as patient hallucinating, not acting normal, patient takes psychiatric medications  EXAM: ACUTE ABDOMEN SERIES (ABDOMEN 2 VIEW & CHEST 1 VIEW)  COMPARISON:  None.  FINDINGS: Heart size and vascular pattern are normal. Lungs are clear. No pleural effusion. No free air  There are no abnormally dilated loops of bowel. No abnormal air-fluid levels. Moderate fecal retention.  Chronic appearing deformity of the pubic symphysis. Levoscoliosis lumbar spine.  IMPRESSION: No acute cardiopulmonary abnormality. No acute abnormality in the abdomen.   Electronically Signed   By: Esperanza Heiraymond  Rubner M.D.   On: 11/26/2014 15:37      EKG Interpretation None      MDM   Final diagnoses:  Dyspnea  Cough    28 year old male with dyspnea. He is significantly tachycardic. Productive cough. Denies any shortness of breath though and no hypoxemia on room air. Will check rectal temperature. No clinical signs of DVT. No overt bleeding. Clinically does not appear to be volume overloaded. EKG. CXR. Low threshold to CT particularly with clear lung sounds. ROS positive for increased thirst. No diagnosed hx  of diabetes. Tachypnea, but not Kussmaul respirations. Will check CBG. Additional blood work.   Workup unremarkable. BMP is normal excluding heart failure. CT without evidence of pulmonary embolism. Tachycardia has improved. Viral illness? Low suspicion for more serious pathology. I feel is stable for discharge at this time.    Raeford Razor, MD 12/02/14 [phone]

## 2014-11-24 NOTE — ED Notes (Signed)
Gave patient urinal, but unable to go. Will try again once fluids have gone through more.

## 2014-11-24 NOTE — ED Notes (Signed)
Patient transported to X-ray 

## 2014-11-24 NOTE — ED Notes (Signed)
Pt reports cough for a week that is producing "thick brown stuff". Pt denies v/n/d or fever at home. Pt tachy in triage 136 and says I just don't feel right. His brother reports he is also not taking his psych meds as he should.

## 2014-11-26 ENCOUNTER — Emergency Department (HOSPITAL_COMMUNITY): Payer: Medicare HMO

## 2014-11-26 ENCOUNTER — Emergency Department (HOSPITAL_COMMUNITY)
Admission: EM | Admit: 2014-11-26 | Discharge: 2014-11-27 | Disposition: A | Discharge status: Home / Self Care | Payer: Medicare HMO | Attending: Emergency Medicine | Admitting: Emergency Medicine

## 2014-11-26 ENCOUNTER — Encounter (HOSPITAL_COMMUNITY): Payer: Self-pay | Admitting: *Deleted

## 2014-11-26 DIAGNOSIS — R1084 Generalized abdominal pain: Secondary | ICD-10-CM | POA: Diagnosis not present

## 2014-11-26 DIAGNOSIS — R000 Tachycardia, unspecified: Secondary | ICD-10-CM | POA: Insufficient documentation

## 2014-11-26 DIAGNOSIS — F200 Paranoid schizophrenia: Secondary | ICD-10-CM | POA: Diagnosis not present

## 2014-11-26 DIAGNOSIS — F1210 Cannabis abuse, uncomplicated: Secondary | ICD-10-CM | POA: Diagnosis not present

## 2014-11-26 DIAGNOSIS — F419 Anxiety disorder, unspecified: Secondary | ICD-10-CM | POA: Insufficient documentation

## 2014-11-26 DIAGNOSIS — F319 Bipolar disorder, unspecified: Secondary | ICD-10-CM | POA: Diagnosis not present

## 2014-11-26 DIAGNOSIS — Z720 Tobacco use: Secondary | ICD-10-CM | POA: Diagnosis not present

## 2014-11-26 DIAGNOSIS — I10 Essential (primary) hypertension: Secondary | ICD-10-CM | POA: Diagnosis not present

## 2014-11-26 DIAGNOSIS — R109 Unspecified abdominal pain: Secondary | ICD-10-CM | POA: Diagnosis present

## 2014-11-26 DIAGNOSIS — Z79899 Other long term (current) drug therapy: Secondary | ICD-10-CM | POA: Insufficient documentation

## 2014-11-26 DIAGNOSIS — Z008 Encounter for other general examination: Secondary | ICD-10-CM | POA: Diagnosis present

## 2014-11-26 HISTORY — DX: Bipolar disorder, unspecified: F31.9

## 2014-11-26 LAB — COMPREHENSIVE METABOLIC PANEL
ALBUMIN: 4 g/dL (ref 3.5–5.2)
ALT: 43 U/L (ref 0–53)
AST: 55 U/L — ABNORMAL HIGH (ref 0–37)
Alkaline Phosphatase: 85 U/L (ref 39–117)
Anion gap: 16 — ABNORMAL HIGH (ref 5–15)
BILIRUBIN TOTAL: 0.4 mg/dL (ref 0.3–1.2)
BUN: 8 mg/dL (ref 6–23)
CHLORIDE: 101 meq/L (ref 96–112)
CO2: 26 mEq/L (ref 19–32)
CREATININE: 1.05 mg/dL (ref 0.50–1.35)
Calcium: 10.2 mg/dL (ref 8.4–10.5)
GFR calc Af Amer: >90 mL/min (ref >90)
GFR calc non Af Amer: >90 mL/min (ref >90)
Glucose, Bld: 129 mg/dL — ABNORMAL HIGH (ref 70–99)
Potassium: 4.1 mEq/L (ref 3.7–5.3)
Sodium: 143 mEq/L (ref 137–147)
TOTAL PROTEIN: 7.7 g/dL (ref 6.0–8.3)

## 2014-11-26 LAB — CBC
HEMATOCRIT: 42.7 % (ref 39.0–52.0)
Hemoglobin: 14.9 g/dL (ref 13.0–17.0)
MCH: 30.5 pg (ref 26.0–34.0)
MCHC: 34.9 g/dL (ref 30.0–36.0)
MCV: 87.5 fL (ref 78.0–100.0)
Platelets: 321 10*3/uL (ref 150–400)
RBC: 4.88 MIL/uL (ref 4.22–5.81)
RDW: 12.1 % (ref 11.5–15.5)
WBC: 9.6 10*3/uL (ref 4.0–10.5)

## 2014-11-26 LAB — RAPID URINE DRUG SCREEN, HOSP PERFORMED
AMPHETAMINES: NOT DETECTED
BARBITURATES: NOT DETECTED
Benzodiazepines: NOT DETECTED
Cocaine: NOT DETECTED
Opiates: NOT DETECTED
Tetrahydrocannabinol: POSITIVE — AB

## 2014-11-26 LAB — SALICYLATE LEVEL: Salicylate Lvl: <2 mg/dL — ABNORMAL LOW (ref 2.8–20.0)

## 2014-11-26 LAB — ACETAMINOPHEN LEVEL: Acetaminophen (Tylenol), Serum: <15 ug/mL (ref 10–30)

## 2014-11-26 LAB — TROPONIN I: Troponin I: <0.3 ng/mL (ref <0.30)

## 2014-11-26 LAB — ETHANOL: Alcohol, Ethyl (B): <11 mg/dL (ref 0–11)

## 2014-11-26 MED ORDER — PRAVASTATIN SODIUM 10 MG PO TABS
10.0000 mg | ORAL_TABLET | Freq: Every day | ORAL | Status: DC
  Administered 2014-11-26: 10 mg via ORAL
  Filled 2014-11-26 (×3): qty 1

## 2014-11-26 MED ORDER — ONDANSETRON HCL 4 MG PO TABS: 4.0000 mg | ORAL_TABLET | Freq: Three times a day (TID) | ORAL | Status: DC | PRN

## 2014-11-26 MED ORDER — LORAZEPAM 1 MG PO TABS: 1.0000 mg | ORAL_TABLET | Freq: Three times a day (TID) | ORAL | Status: DC | PRN

## 2014-11-26 MED ORDER — ACETAMINOPHEN 325 MG PO TABS: 650.0000 mg | ORAL_TABLET | ORAL | Status: DC | PRN

## 2014-11-26 MED ORDER — ZOLPIDEM TARTRATE 5 MG PO TABS
5.0000 mg | ORAL_TABLET | Freq: Every evening | ORAL | Status: DC | PRN
  Administered 2014-11-27: 5 mg via ORAL
  Filled 2014-11-26: qty 1

## 2014-11-26 MED ORDER — LORAZEPAM 2 MG/ML IJ SOLN
1.0000 mg | Freq: Once | INTRAMUSCULAR | Status: AC
  Administered 2014-11-26: 1 mg via INTRAVENOUS
  Filled 2014-11-26: qty 1

## 2014-11-26 MED ORDER — SODIUM CHLORIDE 0.9 % IV BOLUS (SEPSIS)
1000.0000 mL | Freq: Once | INTRAVENOUS | Status: AC
  Administered 2014-11-26: 1000 mL via INTRAVENOUS

## 2014-11-26 MED ORDER — IBUPROFEN 200 MG PO TABS: 600.0000 mg | ORAL_TABLET | Freq: Three times a day (TID) | ORAL | Status: DC | PRN

## 2014-11-26 MED ORDER — HYDROCHLOROTHIAZIDE 12.5 MG PO CAPS
12.5000 mg | ORAL_CAPSULE | Freq: Every day | ORAL | Status: DC
  Administered 2014-11-26 – 2014-11-27 (×2): 12.5 mg via ORAL
  Filled 2014-11-26 (×2): qty 1

## 2014-11-26 MED ORDER — TRAZODONE HCL 50 MG PO TABS: 50.0000 mg | ORAL_TABLET | Freq: Every evening | ORAL | Status: DC | PRN

## 2014-11-26 MED ORDER — ALUM & MAG HYDROXIDE-SIMETH 200-200-20 MG/5ML PO SUSP: 30.0000 mL | ORAL | Status: DC | PRN

## 2014-11-26 MED ORDER — OLANZAPINE 10 MG PO TBDP
20.0000 mg | ORAL_TABLET | Freq: Two times a day (BID) | ORAL | Status: DC
  Administered 2014-11-26 – 2014-11-27 (×2): 20 mg via ORAL
  Filled 2014-11-26 (×2): qty 2

## 2014-11-26 NOTE — ED Notes (Signed)
Pt's brother also sts pt has been sleeping all day and awake all night. As pt hallucinates, brother can get pt to respond to some questions then pt reverts back to hallucination, staring at something in his hands as if he's reading a book.Pt does not respond to RN other than staring.

## 2014-11-26 NOTE — BH Assessment (Signed)
Assessment Note  Lucas Knapp is an 28 y.o. male. Patient was brought into the ED by his brother because of hallucinations although compliant with his medications.  Patient denies SI/HI, hallucinations, and other self-injurious behaviors.  Patient denies hallucinations although family reports witnessing him responding to internal stimuli and his thought not making sense.  Patient presents as drowsy, lethargic, slow speech, slow motor skills, poor eye contact, and flight of ideas.  Patient reports drinking a 24oz beer 3x a week and smoking marijuana 2x week.  Patient a poor historian.  He reports participating with Dr. Omelia BlackwaterHeaden for outpatient medication management and last seen him for 4 months ago but has appointment on 12/08/2014.    CSW consulted with Sh[place], NP it is recommended to re-evaluate in the morning by psychiatrist.    Axis I: Schizophrenia, paranoid type Axis II: Deferred Axis III:  Past Medical History  Diagnosis Date  . Back pain   . Hypertension     pt has been prescribed HCTZ 12.5 mg daily. Pt was 140/80 on admission.   . Schizophrenia   . Bipolar 1 disorder    Axis IV: other psychosocial or environmental problems, problems related to social environment, problems with access to health care services and problems with primary support group Axis V: 41-50 serious symptoms  Past Medical History:  Past Medical History  Diagnosis Date  . Back pain   . Hypertension     pt has been prescribed HCTZ 12.5 mg daily. Pt was 140/80 on admission.   . Schizophrenia   . Bipolar 1 disorder     History reviewed. No pertinent past surgical history.  Family History: No family history on file.  Social History:  reports that he has been smoking Cigarettes.  He has been smoking about 1.00 pack per day. He does not have any smokeless tobacco history on file. He reports that he drinks alcohol. He reports that he uses illicit drugs (Marijuana).  Additional Social History:     CIWA:  CIWA-Ar BP: (!) 145/103 mmHg Pulse Rate: (!) 126 COWS:    Allergies:  Allergies  Allergen Reactions  . Geodon [Ziprasidone Hcl]     Made his throat real dry per pt.    Home Medications:  (Not in a hospital admission)  OB/GYN Status:  No LMP for male patient.  General Assessment Data Location of Assessment: [facility] ED ACT Assessment: Yes Is this a Tele or Face-to-Face Assessment?: Face-to-Face Living Arrangements: Other relatives Can pt return to current living arrangement?: Yes Admission Status: Voluntary Is patient capable of signing voluntary admission?: Yes Transfer from: Home Referral Source: Self/Family/Friend  Medical Screening Exam [facility]) [facility]: [facility] Living Arrangements: Other relatives Name of Psychiatrist: DR Headen  Education Status Is patient currently in school?: No  Risk to self with the past 6 months Suicidal Ideation: No-Not Currently/Within Last 6 Months Suicidal Intent: No-Not Currently/Within Last 6 Months Is patient at risk for suicide?: No Suicidal Plan?: No-Not Currently/Within Last 6 Months Access to Means: No What has been your use of drugs/alcohol within the last 12 months?: alcohol and THC Previous Attempts/Gestures: No Intentional Self Injurious Behavior: None Family Suicide History: Unknown Persecutory voices/beliefs?: No Depression: No Substance abuse history and/or treatment for substance abuse?: Yes (alcohol and THC)  Risk to Others within the past 6 months Homicidal Ideation: No-Not Currently/Within Last 6 Months Thoughts of Harm to Others: No-Not Currently Present/Within Last 6 Months Current Homicidal Intent: No-Not Currently/Within Last 6  Months Current Homicidal Plan: No-Not Currently/Within Last 6 Months Access to Homicidal Means: No History of harm to others?: No Assessment of Violence: None Noted Does patient have access to weapons?: No Criminal Charges Pending?: No Does  patient have a court date: No  Psychosis Hallucinations: Auditory (Pt denies but family report Auditory ) Delusions: Unspecified (paranoid)  Mental Status Report Appear/Hygiene: In hospital gown Eye Contact: Poor Motor Activity: Psychomotor retardation Speech: Slow, Slurred Level of Consciousness: Drowsy Mood: Empty Affect: Constricted Anxiety Level: None Thought Processes: Flight of Ideas Judgement: Impaired Orientation: Person, Place Obsessive Compulsive Thoughts/Behaviors: None  Cognitive Functioning Concentration: Poor Memory: Unable to Assess IQ: Average Insight: see judgement above Impulse Control: Fair Appetite: Fair Sleep: Decreased Vegetative Symptoms: None  ADLScreening [facility] Assessment Services) Patient's cognitive ability adequate to safely complete daily activities?: Yes Patient able to express need for assistance with ADLs?: Yes Independently performs ADLs?: Yes (appropriate for developmental age)  Prior Inpatient Therapy Prior Inpatient Therapy: Yes Prior Therapy Facilty/Provider(s): [facility]  Prior Outpatient Therapy Prior Outpatient Therapy: Yes Prior Therapy Facilty/Provider(s): Dr. Omelia BlackwaterHeaden  ADL Screening (condition at time of admission) Patient's cognitive ability adequate to safely complete daily activities?: Yes Patient able to express need for assistance with ADLs?: Yes Independently performs ADLs?: Yes (appropriate for developmental age)             Advance Directives (For Healthcare) Does patient have an advance directive?: No    Additional Information 1:1 In Past 12 Months?: No CIRT Risk: No Elopement Risk: No Does patient have medical clearance?: Yes     Disposition:  Disposition Initial Assessment Completed for this Encounter: Yes Disposition of Patient: Other dispositions Other disposition(s): Other (Comment) (Re-evaluate in the AM)  On Site Evaluation by:   Reviewed with Physician:    Maryelizabeth Rowanorbett, Caleen Taaffe  A 11/26/2014 5:03 PM

## 2014-11-26 NOTE — ED Notes (Signed)
MD at bedside to discuss plan of care. Pt has came out of his room several times for questions about leaving and speaking to the doctor.

## 2014-11-26 NOTE — ED Notes (Signed)
Pt's brother, Rod [phone]- [phone]

## 2014-11-26 NOTE — ED Notes (Signed)
Pt seen 2 days ago for cough. Continues to have this cough. However, brother sts that pt is not acting his norm. Pt is on psych meds which he has been taking as Rx'd. Pt reports "stress." Denies SI. When asked about HI, AVH, pt just stares at RN and does not respond. Pt is visually hallucinating actively in triage. Appears to be responding to internal stimuli.

## 2014-11-26 NOTE — ED Provider Notes (Signed)
CSN: [id number]     Arrival date & time 11/26/14  1359 History   First MD Initiated Contact with Patient 11/26/14 1432     Chief Complaint  Patient presents with  . Medical Clearance  . Cough     (Consider location/radiation/quality/duration/timing/severity/associated sxs/prior Treatment) HPI [name] is a 28 year old male with past medical history of schizophrenia who was brought to the ER by his brother due to altered mental status and hallucinations. Patient's brother reports that 2 days ago he was seen and evaluated in the ER for cough, and since then he has "not been acting right". Patient's brother states that patient has been sleeping all day and awake all night. He reports patient has been responding to hallucinations. Patient's brother reports patient has been compliant with his medication, has BEEN complaining of a cough. Patient denies suicidal or homicidal ideation.  Past Medical History  Diagnosis Date  . Back pain   . Hypertension     pt has been prescribed HCTZ 12.5 mg daily. Pt was 140/80 on admission.   . Schizophrenia   . Bipolar 1 disorder    History reviewed. No pertinent past surgical history. No family history on file. History  Substance Use Topics  . Smoking status: Current Every Day Smoker -- 1.00 packs/day    Types: Cigarettes  . Smokeless tobacco: Not on file  . Alcohol Use: Yes     Comment: occasionally    Review of Systems  Unable to perform ROS: Mental status change      Allergies  Geodon  Home Medications   Prior to Admission medications   Medication Sig Start Date End Date Taking? Authorizing Provider  B Complex Vitamins (VITAMIN B COMPLEX PO) Take 1 tablet by mouth daily.   Yes Historical Provider, MD  carbamazepine (TEGRETOL XR) 400 MG 12 hr tablet Take 1 tablet (400 mg total) by mouth at bedtime. For mood stabilization. Patient not taking: Reported on 11/24/2014 09/22/12   Curlene Labrumandy D Readling, MD  Cholecalciferol (VITAMIN D)  1000 UNITS capsule Take 1,000 Units by mouth daily.    Historical Provider, MD  co-enzyme Q-10 30 MG capsule Take 60 mg by mouth daily.    Historical Provider, MD  folic acid (FOLVITE) 1 MG tablet Take 1 mg by mouth 2 (two) times daily.    Historical Provider, MD  hydrochlorothiazide (MICROZIDE) 12.5 MG capsule Take 1 capsule (12.5 mg total) by mouth daily. For blood pressure control. 09/22/12   Curlene Labrumandy D Readling, MD  ibuprofen (ADVIL,MOTRIN) 800 MG tablet Take 800 mg by mouth every 8 (eight) hours as needed for moderate pain.    Historical Provider, MD  lovastatin (MEVACOR) 20 MG tablet Take 20 mg by mouth at bedtime.    Historical Provider, MD  Melatonin 5 MG TABS Take 5 mg by mouth at bedtime as needed.    Historical Provider, MD  Multiple Vitamins-Minerals (MULTIVITAMIN & MINERAL PO) Take 1 tablet by mouth daily.    Historical Provider, MD  niacin 500 MG tablet Take 500 mg by mouth daily.     Historical Provider, MD  OLANZapine zydis (ZYPREXA) 20 MG disintegrating tablet Take 1 tablet (20 mg total) by mouth at bedtime. For mood stabilization and clarity of thought. Patient taking differently: Take 20 mg by mouth 2 (two) times daily. For mood stabilization and clarity of thought. 09/22/12   Ronny Baconandy D Readling, MD  traZODone (DESYREL) 50 MG tablet Take 1 tablet (50 mg total) by mouth at bedtime as needed for  sleep (may repeat x 1). 09/22/12   Curlene Labrum Readling, MD  vitamin C (ASCORBIC ACID) 500 MG tablet Take 500 mg by mouth daily.    Historical Provider, MD   BP 145/103 mmHg  Pulse 126  Temp(Src) 98.4 F (36.9 C) (Oral)  Resp 16  SpO2 97% Physical Exam  Constitutional: He is oriented to person, place, and time. He appears well-developed and well-nourished. No distress.  HENT:  Head: Normocephalic and atraumatic.  Mouth/Throat: Oropharynx is clear and moist. No oropharyngeal exudate.  Eyes: Right eye exhibits no discharge. Left eye exhibits no discharge. No scleral icterus.  Neck: Normal range  of motion.  Cardiovascular: Regular rhythm, S1 normal, S2 normal and normal heart sounds.  Tachycardia present.   No murmur heard. Pulmonary/Chest: Effort normal and breath sounds normal. No respiratory distress.  Abdominal: Soft. There is generalized tenderness.  Musculoskeletal: Normal range of motion. He exhibits no edema or tenderness.  Neurological: He is alert and oriented to person, place, and time. No cranial nerve deficit. Coordination normal.  Skin: Skin is warm and dry. No rash noted. He is not diaphoretic.  Psychiatric: He has a normal mood and affect.  Nursing note and vitals reviewed.   ED Course  Procedures (including critical care time) Labs Review Labs Reviewed  COMPREHENSIVE METABOLIC PANEL - Abnormal; Notable for the following:    Glucose, Bld 129 (*)    AST 55 (*)    Anion gap 16 (*)    All other components within normal limits  SALICYLATE LEVEL - Abnormal; Notable for the following:    Salicylate Lvl <2.0 (*)    All other components within normal limits  ACETAMINOPHEN LEVEL  CBC  ETHANOL  TROPONIN I  URINE RAPID DRUG SCREEN (HOSP PERFORMED)    Imaging Review Dg Abd Acute W/chest  11/26/2014   CLINICAL DATA:  Initial evaluation for abdominal pain, patient cannot communicate exactly where or for all long, recent history of cough 2 days ago which continues, family members as patient hallucinating, not acting normal, patient takes psychiatric medications  EXAM: ACUTE ABDOMEN SERIES (ABDOMEN 2 VIEW & CHEST 1 VIEW)  COMPARISON:  None.  FINDINGS: Heart size and vascular pattern are normal. Lungs are clear. No pleural effusion. No free air  There are no abnormally dilated loops of bowel. No abnormal air-fluid levels. Moderate fecal retention.  Chronic appearing deformity of the pubic symphysis. Levoscoliosis lumbar spine.  IMPRESSION: No acute cardiopulmonary abnormality. No acute abnormality in the abdomen.   Electronically Signed   By: Esperanza Heir M.D.   On:  11/26/2014 15:37     EKG Interpretation None      MDM   Final diagnoses:  Abdominal pain   Patient brought to the ER by his brother for hallucinations and acting like he does when he is off of his medication. Patient with history of schizophrenia. On exam patient is responding to obvious internal stimuli, reading an invisible [employer] book. The patient appears anxious, however is not agitated, and is cooperative with staff. Patient tachycardic on exam at 120. Patient here 2 days ago and also tachycardic that time. Patient had an extensive workup including lab work and CT angiogram of chest.  Patient's workup 2 nights ago was negative for any acute pathology, I believe patient's tachycardia may be only due to his agitation at this point. Patient not hypoxic, nontachypneic, and in no acute distress. No obvious source of bleeding or infection noted. No DVT noted on exam. We will repeat  x-ray, and monitor lab work, treat with Ativan and fluids to see if we can treat patient's tachycardia. Patient placed on ED psych hold and medically cleared for TTS consult.  BP 145/103 mmHg  Pulse 126  Temp(Src) 98.4 F (36.9 C) (Oral)  Resp 16  SpO2 97%  Signed,  Ladona MowJoe Darleen Moffitt, PA-C 4:25 PM    Monte FantasiaJoseph W Jaki Hammerschmidt, PA-C 11/26/14 1627  Gilda Creasehristopher J. Pollina, MD 11/29/14 [phone]

## 2014-11-27 DIAGNOSIS — F200 Paranoid schizophrenia: Secondary | ICD-10-CM

## 2014-11-27 DIAGNOSIS — R109 Unspecified abdominal pain: Secondary | ICD-10-CM | POA: Diagnosis present

## 2014-11-27 MED ORDER — BENZONATATE 200 MG PO CAPS: 200.0000 mg | ORAL_CAPSULE | Freq: Three times a day (TID) | ORAL | Status: DC | PRN

## 2014-11-27 MED ORDER — VITAMIN D 1000 UNITS PO CAPS: 1000.0000 [IU] | ORAL_CAPSULE | Freq: Every day | ORAL | Status: DC

## 2014-11-27 MED ORDER — HYDROCHLOROTHIAZIDE 12.5 MG PO CAPS: 12.5000 mg | ORAL_CAPSULE | Freq: Every day | ORAL | Status: DC

## 2014-11-27 MED ORDER — TRAZODONE HCL 100 MG PO TABS: 100.0000 mg | ORAL_TABLET | Freq: Every evening | ORAL | Status: DC | PRN

## 2014-11-27 MED ORDER — ASENAPINE MALEATE 10 MG SL SUBL: 10.0000 mg | SUBLINGUAL_TABLET | Freq: Two times a day (BID) | SUBLINGUAL | Status: DC

## 2014-11-27 MED ORDER — LOVASTATIN 20 MG PO TABS: 20.0000 mg | ORAL_TABLET | Freq: Every day | ORAL | Status: DC

## 2014-11-27 MED ORDER — GUAIFENESIN ER 600 MG PO TB12
1200.0000 mg | ORAL_TABLET | Freq: Two times a day (BID) | ORAL | Status: DC | PRN
  Administered 2014-11-27: 1200 mg via ORAL
  Filled 2014-11-27: qty 2

## 2014-11-27 MED ORDER — ZOLPIDEM TARTRATE 5 MG PO TABS: 5.0000 mg | ORAL_TABLET | Freq: Every evening | ORAL | Status: DC | PRN

## 2014-11-27 MED ORDER — COENZYME Q10 30 MG PO CAPS: 60.0000 mg | ORAL_CAPSULE | Freq: Every day | ORAL | Status: DC

## 2014-11-27 MED ORDER — ASENAPINE MALEATE 5 MG SL SUBL
10.0000 mg | SUBLINGUAL_TABLET | Freq: Two times a day (BID) | SUBLINGUAL | Status: DC
  Administered 2014-11-27: 10 mg via SUBLINGUAL
  Filled 2014-11-27 (×2): qty 2

## 2014-11-27 MED ORDER — BENZONATATE 100 MG PO CAPS
200.0000 mg | ORAL_CAPSULE | Freq: Three times a day (TID) | ORAL | Status: DC | PRN
  Administered 2014-11-27 (×2): 200 mg via ORAL
  Filled 2014-11-27 (×3): qty 2

## 2014-11-27 NOTE — Consult Note (Signed)
[facility] Face-to-Face Psychiatry Consult   Reason for Consult:  psychosis Referring Physician:  EDP  Lucas Knapp is an 28 y.o. male. Total Time spent with patient: 45 minutes  Assessment: AXIS I:  schizophrenia paranoid type AXIS II:  Deferred AXIS III:   Past Medical History  Diagnosis Date  . Back pain   . Hypertension     pt has been prescribed HCTZ 12.5 mg daily. Pt was 140/80 on admission.   . Schizophrenia   . Bipolar 1 disorder    AXIS IV:  economic problems and other psychosocial or environmental problems AXIS V:  61-70 mild symptoms  Plan:  No evidence of imminent risk to self or others at present.    Subjective:   Lucas Knapp is a 28 y.o. male patient who was brought into the ED by his brother because of hallucinations although compliant with his medications.  HPI:  Lucas Knapp is a 28 year old male patient who presented to the emergency department with an increase in the level of his psychosis despite reported medication compliance.  Patient reports a long history of schizophrenia with has been managed with Saphris.  Patient reports slight auditory hallucinations hearing a clock tick.  Patient denies visual hallucinations and does not appear to attend to internal stimuli.  Patient is slightly tangential in conversation and states that he has been feeling a little overwhelmed lately but describes mood as euthymic.  Denies suicidal ideation, homicidal ideation.  Patient has an appointment with his outpatient provider scheduled with Dr. Rosine Door.  HPI Elements:   Location:  generalized. Quality:  acute. Severity:  mild. Timing:  constant. Duration:  exacerbation over past few days. Context:  increased stress and feeling overwhelmed. .  Past Psychiatric History: Past Medical History  Diagnosis Date  . Back pain   . Hypertension     pt has been prescribed HCTZ 12.5 mg daily. Pt was 140/80 on admission.   . Schizophrenia   . Bipolar 1 disorder     reports that he  has been smoking Cigarettes.  He has been smoking about 1.00 pack per day. He does not have any smokeless tobacco history on file. He reports that he drinks alcohol. He reports that he uses illicit drugs (Marijuana). No family history on file. Family History Substance Abuse: Yes, Describe: (alcohol and THC) Family Supports: Yes, List: (brother, mother) Living Arrangements: Other relatives Can pt return to current living arrangement?: Yes   Allergies:   Allergies  Allergen Reactions  . Geodon [Ziprasidone Hcl]     Made his throat real dry per pt.    ACT Assessment Complete:  Yes:    Educational Status    Risk to Self: Risk to self with the past 6 months Suicidal Ideation: No-Not Currently/Within Last 6 Months Suicidal Intent: No-Not Currently/Within Last 6 Months Is patient at risk for suicide?: No Suicidal Plan?: No-Not Currently/Within Last 6 Months Access to Means: No What has been your use of drugs/alcohol within the last 12 months?: alcohol and THC Previous Attempts/Gestures: No Intentional Self Injurious Behavior: None Family Suicide History: Unknown Persecutory voices/beliefs?: No Depression: No Substance abuse history and/or treatment for substance abuse?: Yes  Risk to Others: Risk to Others within the past 6 months Homicidal Ideation: No-Not Currently/Within Last 6 Months Thoughts of Harm to Others: No-Not Currently Present/Within Last 6 Months Current Homicidal Intent: No-Not Currently/Within Last 6 Months Current Homicidal Plan: No-Not Currently/Within Last 6 Months Access to Homicidal Means: No History of harm to others?: No  Assessment of Violence: None Noted Does patient have access to weapons?: No Criminal Charges Pending?: No Does patient have a court date: No  Abuse:    Prior Inpatient Therapy: Prior Inpatient Therapy Prior Inpatient Therapy: Yes Prior Therapy Facilty/Provider(s): [facility], [facility]  Prior Outpatient Therapy: Prior Outpatient Therapy Prior  Outpatient Therapy: Yes Prior Therapy Facilty/Provider(s): Dr. Rosine Door  Additional Information: Additional Information 1:1 In Past 12 Months?: No CIRT Risk: No Elopement Risk: No Does patient have medical clearance?: Yes                  Objective: Blood pressure 131/64, pulse 80, temperature 98.1 F (36.7 C), temperature source Oral, resp. rate 18, SpO2 97 %.There is no weight on file to calculate BMI. Results for orders placed or performed during the hospital encounter of 11/26/14 (from the past 72 hour(s))  Acetaminophen level     Status: None   Collection Time: 11/26/14  2:44 PM  Result Value Ref Range   Acetaminophen (Tylenol), Serum <15.0 10 - 30 ug/mL    Comment:        THERAPEUTIC CONCENTRATIONS VARY SIGNIFICANTLY. A RANGE OF 10-30 ug/mL MAY BE AN EFFECTIVE CONCENTRATION FOR MANY PATIENTS. HOWEVER, SOME ARE BEST TREATED AT CONCENTRATIONS OUTSIDE THIS RANGE. ACETAMINOPHEN CONCENTRATIONS >150 ug/mL AT 4 HOURS AFTER INGESTION AND >50 ug/mL AT 12 HOURS AFTER INGESTION ARE OFTEN ASSOCIATED WITH TOXIC REACTIONS.   CBC     Status: None   Collection Time: 11/26/14  2:44 PM  Result Value Ref Range   WBC 9.6 4.0 - 10.5 K/uL   RBC 4.88 4.22 - 5.81 MIL/uL   Hemoglobin 14.9 13.0 - 17.0 g/dL   HCT 42.7 39.0 - 52.0 %   MCV 87.5 78.0 - 100.0 fL   MCH 30.5 26.0 - 34.0 pg   MCHC 34.9 30.0 - 36.0 g/dL   RDW 12.1 11.5 - 15.5 %   Platelets 321 150 - 400 K/uL  Comprehensive metabolic panel     Status: Abnormal   Collection Time: 11/26/14  2:44 PM  Result Value Ref Range   Sodium 143 137 - 147 mEq/L   Potassium 4.1 3.7 - 5.3 mEq/L   Chloride 101 96 - 112 mEq/L   CO2 26 19 - 32 mEq/L   Glucose, Bld 129 (H) 70 - 99 mg/dL   BUN 8 6 - 23 mg/dL   Creatinine, Ser 1.05 0.50 - 1.35 mg/dL   Calcium 10.2 8.4 - 10.5 mg/dL   Total Protein 7.7 6.0 - 8.3 g/dL   Albumin 4.0 3.5 - 5.2 g/dL   AST 55 (H) 0 - 37 U/L   ALT 43 0 - 53 U/L   Alkaline Phosphatase 85 39 - 117 U/L    Total Bilirubin 0.4 0.3 - 1.2 mg/dL   GFR calc non Af Amer >90 >90 mL/min   GFR calc Af Amer >90 >90 mL/min    Comment: (NOTE) The eGFR has been calculated using the CKD EPI equation. This calculation has not been validated in all clinical situations. eGFR's persistently <90 mL/min signify possible Chronic Kidney Disease.    Anion gap 16 (H) 5 - 15  Ethanol (ETOH)     Status: None   Collection Time: 11/26/14  2:44 PM  Result Value Ref Range   Alcohol, Ethyl (B) <11 0 - 11 mg/dL    Comment:        LOWEST DETECTABLE LIMIT FOR SERUM ALCOHOL IS 11 mg/dL FOR MEDICAL PURPOSES ONLY   Salicylate level  Status: Abnormal   Collection Time: 11/26/14  2:44 PM  Result Value Ref Range   Salicylate Lvl <3.2 (L) 2.8 - 20.0 mg/dL  Troponin I     Status: None   Collection Time: 11/26/14  2:44 PM  Result Value Ref Range   Troponin I <0.30 <0.30 ng/mL    Comment:        Due to the release kinetics of cTnI, a negative result within the first hours of the onset of symptoms does not rule out myocardial infarction with certainty. If myocardial infarction is still suspected, repeat the test at appropriate intervals.   Urine Drug Screen     Status: Abnormal   Collection Time: 11/26/14  5:21 PM  Result Value Ref Range   Opiates NONE DETECTED NONE DETECTED   Cocaine NONE DETECTED NONE DETECTED   Benzodiazepines NONE DETECTED NONE DETECTED   Amphetamines NONE DETECTED NONE DETECTED   Tetrahydrocannabinol POSITIVE (A) NONE DETECTED   Barbiturates NONE DETECTED NONE DETECTED    Comment:        DRUG SCREEN FOR MEDICAL PURPOSES ONLY.  IF CONFIRMATION IS NEEDED FOR ANY PURPOSE, NOTIFY LAB WITHIN 5 DAYS.        LOWEST DETECTABLE LIMITS FOR URINE DRUG SCREEN Drug Class       Cutoff (ng/mL) Amphetamine      1000 Barbiturate      200 Benzodiazepine   202 Tricyclics       542 Opiates          300 Cocaine          300 THC              50    Labs are reviewed and are pertinent for medical  issues being treated.  UDS positive for THC .  Current Facility-Administered Medications  Medication Dose Route Frequency Provider Last Rate Last Dose  . acetaminophen (TYLENOL) tablet 650 mg  650 mg Oral Q4H PRN Carrie Mew, PA-C      . alum & mag hydroxide-simeth (MAALOX/MYLANTA) 200-200-20 MG/5ML suspension 30 mL  30 mL Oral PRN Carrie Mew, PA-C      . asenapine (SAPHRIS) sublingual tablet 10 mg  10 mg Sublingual BID Neena Beecham      . benzonatate (TESSALON) capsule 200 mg  200 mg Oral TID PRN Kalman Drape, MD   200 mg at 11/27/14 1240  . guaiFENesin (MUCINEX) 12 hr tablet 1,200 mg  1,200 mg Oral BID PRN Kalman Drape, MD   1,200 mg at 11/27/14 0403  . hydrochlorothiazide (MICROZIDE) capsule 12.5 mg  12.5 mg Oral Daily Carrie Mew, PA-C   12.5 mg at 11/27/14 0954  . ibuprofen (ADVIL,MOTRIN) tablet 600 mg  600 mg Oral Q8H PRN Carrie Mew, PA-C      . LORazepam (ATIVAN) tablet 1 mg  1 mg Oral Q8H PRN Carrie Mew, PA-C      . ondansetron [facility]) tablet 4 mg  4 mg Oral Q8H PRN Carrie Mew, PA-C      . pravastatin (PRAVACHOL) tablet 10 mg  10 mg Oral q1800 Carrie Mew, PA-C   10 mg at 11/26/14 1814  . traZODone (DESYREL) tablet 100 mg  100 mg Oral QHS PRN Celia Gibbons       Current Outpatient Prescriptions  Medication Sig Dispense Refill  . Asenapine Maleate (SAPHRIS) 10 MG SUBL Place 10 mg under the tongue 2 (two) times daily.    . B Complex Vitamins (  VITAMIN B COMPLEX PO) Take 1 tablet by mouth daily.    . Cholecalciferol (VITAMIN D) 1000 UNITS capsule Take 1,000 Units by mouth daily.    Marland Kitchen co-enzyme Q-10 30 MG capsule Take 60 mg by mouth daily.    . folic acid (FOLVITE) 1 MG tablet Take 1 mg by mouth 2 (two) times daily.    . hydrochlorothiazide (MICROZIDE) 12.5 MG capsule Take 1 capsule (12.5 mg total) by mouth daily. For blood pressure control. 30 capsule 0  . ibuprofen (ADVIL,MOTRIN) 800 MG tablet Take 800 mg by mouth every 8 (eight) hours as needed for  moderate pain.    Marland Kitchen lovastatin (MEVACOR) 20 MG tablet Take 20 mg by mouth at bedtime.    . Multiple Vitamins-Minerals (MULTIVITAMIN & MINERAL PO) Take 1 tablet by mouth daily.    . vitamin C (ASCORBIC ACID) 500 MG tablet Take 500 mg by mouth daily.    . carbamazepine (TEGRETOL XR) 400 MG 12 hr tablet Take 1 tablet (400 mg total) by mouth at bedtime. For mood stabilization. (Patient not taking: Reported on 11/24/2014) 30 tablet 0    Psychiatric Specialty Exam:     Blood pressure 131/64, pulse 80, temperature 98.1 F (36.7 C), temperature source Oral, resp. rate 18, SpO2 97 %.There is no weight on file to calculate BMI.  General Appearance: Casual  Eye Contact::  Good  Speech:  Normal Rate soft flowing   Volume:  Normal  Mood:  Euthymic  Affect:  Congruent  Thought Process:  Tangential  Orientation:  Full (Time, Place, and Person)  Thought Content:  Hallucinations: Auditory  Suicidal Thoughts:  No  Homicidal Thoughts:  No  Memory:  Immediate;   Fair Recent;   Fair Remote;   Fair  Judgement:  Fair  Insight:  Shallow  Psychomotor Activity:  Normal  Concentration:  Fair  Recall:  [place]: Fair  Akathisia:  No  Handed:  Right  AIMS (if indicated):     Assets:  Desire for Improvement Housing Social Support  Sleep:      Musculoskeletal: Strength & Muscle Tone: within normal limits Gait & Station: normal Patient leans: N/A  Treatment Plan Summary: Discharge patient to home to follow up with Dr. Rosine Door as previously scheduled   Continue current medications.  1.  Take all your medications as prescribed.              2.  Report any adverse side effects to outpatient provider.                       3.  Patient instructed to not use alcohol or illegal drugs while on prescription medicines.            4.  In the event of worsening symptoms, instructed patient to call 911, the crisis hotline or go to nearest emergency room for evaluation of  symptoms.  Lucas Knapp, [place]  11/27/2014 12:46 PM  Patient seen, evaluated and I agree with notes by Nurse Practitioner. Corena Pilgrim, MD

## 2014-11-27 NOTE — ED Notes (Signed)
Patient is resting comfortably. 

## 2014-11-27 NOTE — ED Notes (Signed)
Pt asked for activity materials: coloring pages and word searches. Pt states he is bored. Copies where printed off and given to the patient.

## 2014-11-27 NOTE — [facility] Suicide Risk Assessment (Cosign Needed)
Suicide Risk Assessment  Discharge Assessment     Demographic Factors:  Male, Low socioeconomic status and Living alone  Total Time spent with patient: 45 minutes Psychiatric Specialty Exam:     Blood pressure 131/64, pulse 80, temperature 98.1 F (36.7 C), temperature source Oral, resp. rate 18, SpO2 97 %.There is no weight on file to calculate BMI.  General Appearance: Casual  Eye Contact::  Good  Speech:  Normal Rate soft flowing   Volume:  Normal  Mood:  Euthymic  Affect:  Congruent  Thought Process:  Tangential  Orientation:  Full (Time, Place, and Person)  Thought Content:  Hallucinations: Auditory  Suicidal Thoughts:  No  Homicidal Thoughts:  No  Memory:  Immediate;   Fair Recent;   Fair Remote;   Fair  Judgement:  Fair  Insight:  Shallow  Psychomotor Activity:  Normal  Concentration:  Fair  Recall:  [employer]Fair  Fund of Knowledge:Fair  Language: Fair  Akathisia:  No  Handed:  Right  AIMS (if indicated):     Assets:  Desire for Improvement Housing Social Support  Sleep:      Musculoskeletal: Strength & Muscle Tone: within normal limits Gait & Station: normal Patient leans: N/A     Mental Status Per Nursing Assessment::   On Admission:   Patient presented with an increase in the positive symptoms of schizophrenia.  Patient denies command hallucinations and denied SI/HI. On admission  Current Mental Status by Physician: NA  Continues to deny suicidal or homicidal ideation.  Reports auditory hallucinations of a clock ticking in his ear.  Denies visual hallucinations.  Does not attend to internal stimuli   Loss Factors: NA  Historical Factors: Family history of mental illness or substance abuse  Risk Reduction Factors:   Living with another person, especially a relative, Positive social support and Positive therapeutic relationship  States he will be going to live with his brother   Continued Clinical Symptoms:  Schizophrenia:   Paranoid or  undifferentiated type  Cognitive Features That Contribute To Risk:  Denies   Suicide Risk:  Minimal: No identifiable suicidal ideation.  Patients presenting with no risk factors but with morbid ruminations; may be classified as minimal risk based on the severity of the depressive symptoms  Discharge Diagnoses:   AXIS I:  chronic paranoid schizophrenia AXIS II:  Deferred AXIS III:   Past Medical History  Diagnosis Date  . Back pain   . Hypertension     pt has been prescribed HCTZ 12.5 mg daily. Pt was 140/80 on admission.   . Schizophrenia   . Bipolar 1 disorder    AXIS IV:  economic problems AXIS V:  61-70 mild symptoms  Plan Of Care/Follow-up recommendations:  Activity:  as tolerated Diet:  heart healthy well balanced   Is patient on multiple antipsychotic therapies at discharge:  No   Has Patient had three or more failed trials of antipsychotic monotherapy by history:  No  Recommended Plan for Multiple Antipsychotic Therapies: NA    Carolene Gitto  PMH-NP  11/27/2014, 1:16 PM

## 2014-11-27 NOTE — ED Notes (Addendum)
Pt came out of the room to the nurses station to ask the time and what time the doctor will round. He states that he has a MD appointment in the o

## 2014-11-27 NOTE — Progress Notes (Signed)
  CARE MANAGEMENT ED NOTE 11/27/2014  Patient:  Lucas Knapp Knapp,Lucas Knapp   Account Number:  [id number]  Date Initiated:  11/27/2014  Documentation initiated by:  Edd ArbourGIBBS,Jonisha Kindig  Subjective/Objective Assessment:   28 yr old Lucas Knapp [place] [facility] 11/26/14 1350 dx schizophrenia PMH back pain, HTn Brought to Ed by brother for hallucinations.     Subjective/Objective Assessment Detail:   pcp is Judeth CornfieldStephanie Knapp  Pt states he has an appointment with pcp on 11/28/14 CM entered information in d/c section f/u section of EPIC  scheduled for d/c with f/u services of [facility]     Action/Plan:   ED Cm spoke with pt Updated EPIC for pcp   Action/Plan Detail:   Anticipated DC Date:  11/27/2014     Status Recommendation to Physician:   Result of Recommendation:    Other ED Services  Consult Working Plan    DC Planning Services  Other  Outpatient Services - Pt will follow up  PCP issues    Choice offered to / List presented to:            Status of service:  Completed, signed off  ED Comments:   ED Comments Detail:

## 2015-07-10 DIAGNOSIS — M6740 Ganglion, unspecified site: Secondary | ICD-10-CM | POA: Insufficient documentation

## 2016-01-29 DIAGNOSIS — F250 Schizoaffective disorder, bipolar type: Secondary | ICD-10-CM | POA: Diagnosis not present

## 2016-02-15 ENCOUNTER — Telehealth: Payer: Self-pay | Admitting: *Deleted

## 2016-02-15 ENCOUNTER — Encounter: Payer: Self-pay | Admitting: *Deleted

## 2016-02-15 NOTE — Telephone Encounter (Signed)
Pre-Visit Call completed with patient and chart updated.   Pre-Visit Info documented in Specialty Comments under [employer].    

## 2016-02-18 ENCOUNTER — Ambulatory Visit (INDEPENDENT_AMBULATORY_CARE_PROVIDER_SITE_OTHER): Payer: Commercial Managed Care - HMO | Admitting: Family

## 2016-02-18 ENCOUNTER — Encounter: Payer: Self-pay | Admitting: Family

## 2016-02-18 VITALS — BP 120/85 | HR 97 | Temp 98.4°F | Resp 16 | Ht 64.0 in | Wt 218.4 lb

## 2016-02-18 DIAGNOSIS — E785 Hyperlipidemia, unspecified: Secondary | ICD-10-CM

## 2016-02-18 DIAGNOSIS — F200 Paranoid schizophrenia: Secondary | ICD-10-CM

## 2016-02-18 DIAGNOSIS — I10 Essential (primary) hypertension: Secondary | ICD-10-CM | POA: Diagnosis not present

## 2016-02-18 DIAGNOSIS — K5900 Constipation, unspecified: Secondary | ICD-10-CM

## 2016-02-18 DIAGNOSIS — R5382 Chronic fatigue, unspecified: Secondary | ICD-10-CM

## 2016-02-18 DIAGNOSIS — R5383 Other fatigue: Secondary | ICD-10-CM | POA: Insufficient documentation

## 2016-02-18 DIAGNOSIS — M545 Low back pain, unspecified: Secondary | ICD-10-CM | POA: Insufficient documentation

## 2016-02-18 LAB — BASIC METABOLIC PANEL
BUN: 7 mg/dL (ref 6–23)
CO2: 26 meq/L (ref 19–32)
CREATININE: 0.91 mg/dL (ref 0.40–1.50)
Calcium: 10 mg/dL (ref 8.4–10.5)
Chloride: 102 mEq/L (ref 96–112)
GFR: 124.65 mL/min (ref >60.00)
Glucose, Bld: 101 mg/dL — ABNORMAL HIGH (ref 70–99)
Potassium: 3.8 mEq/L (ref 3.5–5.1)
Sodium: 138 mEq/L (ref 135–145)

## 2016-02-18 LAB — CBC WITH DIFFERENTIAL/PLATELET
Basophils Absolute: 0.1 10*3/uL (ref 0.0–0.1)
Basophils Relative: 1 % (ref 0.0–3.0)
EOS PCT: 2.6 % (ref 0.0–5.0)
Eosinophils Absolute: 0.2 10*3/uL (ref 0.0–0.7)
HCT: 49 % (ref 39.0–52.0)
Hemoglobin: 16.8 g/dL (ref 13.0–17.0)
LYMPHS ABS: 3.4 10*3/uL (ref 0.7–4.0)
Lymphocytes Relative: 53.9 % — ABNORMAL HIGH (ref 12.0–46.0)
MCHC: 34.2 g/dL (ref 30.0–36.0)
MCV: 88.5 fl (ref 78.0–100.0)
Monocytes Absolute: 0.4 10*3/uL (ref 0.1–1.0)
Monocytes Relative: 5.9 % (ref 3.0–12.0)
NEUTROS ABS: 2.3 10*3/uL (ref 1.4–7.7)
NEUTROS PCT: 36.6 % — AB (ref 43.0–77.0)
Platelets: 411 10*3/uL — ABNORMAL HIGH (ref 150.0–400.0)
RBC: 5.54 Mil/uL (ref 4.22–5.81)
RDW: 12.6 % (ref 11.5–15.5)
WBC: 6.3 10*3/uL (ref 4.0–10.5)

## 2016-02-18 LAB — HEPATIC FUNCTION PANEL
ALK PHOS: 62 U/L (ref 39–117)
ALT: 24 U/L (ref 0–53)
AST: 25 U/L (ref 0–37)
Albumin: 4.6 g/dL (ref 3.5–5.2)
Bilirubin, Direct: 0.1 mg/dL (ref 0.0–0.3)
Total Bilirubin: 0.6 mg/dL (ref 0.2–1.2)
Total Protein: 7.8 g/dL (ref 6.0–8.3)

## 2016-02-18 LAB — TSH: TSH: 2.12 u[IU]/mL (ref 0.35–4.50)

## 2016-02-18 MED ORDER — IBUPROFEN 800 MG PO TABS: 800.0000 mg | ORAL_TABLET | Freq: Three times a day (TID) | ORAL | Status: DC | PRN

## 2016-02-18 MED ORDER — LOVASTATIN 20 MG PO TABS: 20.0000 mg | ORAL_TABLET | Freq: Every day | ORAL | Status: DC

## 2016-02-18 NOTE — Patient Instructions (Addendum)
Drink 6-8 glasses of water a day. Make sure to eat lots of fresh fruits/veggies. Complete lab work prior to leaving. Schedule a complete physical at the front desk.  Welcome to [facility]!

## 2016-02-18 NOTE — Assessment & Plan Note (Signed)
BP stable on hctz, continue same.  

## 2016-02-18 NOTE — Assessment & Plan Note (Signed)
Restart mevacor, plan to check lipids next visit.

## 2016-02-18 NOTE — Assessment & Plan Note (Signed)
He did complete an epsworth sleepiness scale and scored 11. Will check cbc and tsh.  Could be side effect of meds.  Consider sleep study if work up unrevealing and symptoms persist.

## 2016-02-18 NOTE — Assessment & Plan Note (Signed)
Stable with sparing use of ibuprofen. Monitor.

## 2016-02-18 NOTE — Progress Notes (Signed)
Pre visit review using our clinic review tool, if applicable. No additional management support is needed unless otherwise documented below in the visit note. 

## 2016-02-18 NOTE — Progress Notes (Signed)
Subjective:    Patient ID: Lucas Knapp, male    DOB: Apr 13, 1986, 30 y.o.   MRN: [id number]  HPI  Lucas Knapp is a 30 yr old male who presents today to establish care.Preveiously followed by Anna Genre.   1) HTN- on hctz.   BP Readings from Last 3 Encounters:  02/18/16 120/85  11/27/14 165/77  11/24/14 151/102   2) Hyperlipidemia- has been out of mevacor for a few weeks. Was tolerating med without difficulty prior to running out.   3) Low energy- reports that finds himself tired even after a good night sleep.  Thinks he might snore sometime.  Notes that this has been present for a few months. Does report that he seems to be less sleep on days when he has not taken his psychiatric medication.   4) Schizophrenia/bipolar disorder- Followed at the [facility]. Reports good mood.   Reports sparing use of ibuprofen for low back pain  Review of Systems  Constitutional: Negative for unexpected weight change.  HENT: Negative for rhinorrhea.   Respiratory: Positive for cough. Negative for shortness of breath.   Cardiovascular: Negative for chest pain.  Gastrointestinal: Negative for nausea, vomiting and diarrhea.       Occasional constipation  Genitourinary: Negative for dysuria and frequency.  Musculoskeletal: Positive for back pain. Negative for myalgias and arthralgias.       Reports hx of "crushed vertebra" acts up from "time to time"  Neurological: Negative for headaches.  Hematological: Negative for adenopathy.  Psychiatric/Behavioral:       See HPI   Past Medical History  Diagnosis Date  . Back pain   . Hypertension     pt has been prescribed HCTZ 12.5 mg daily. Pt was 140/80 on admission.   . Schizophrenia (HCC)   . Bipolar 1 disorder (HCC)   . Hyperlipidemia     Social History   Social History  . Marital Status: Single    Spouse Name: N/A  . Number of Children: N/A  . Years of Education: N/A   Occupational History  . Not on file.   Social  History Main Topics  . Smoking status: Current Every Day Smoker    Types: Cigarettes  . Smokeless tobacco: Not on file     Comment: 5 cigarettes a day  . Alcohol Use: 0.0 oz/week    0 Standard drinks or equivalent per week     Comment: occasionally  . Drug Use: Yes    Special: Marijuana  . Sexual Activity: Not on file   Other Topics Concern  . 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   Single   No children   Completed college (bachelors in Statistician)   Enjoys swimming, playing pool, walking   Grew up [place].  Has identical twin and 2 other brothers in [place]    No past surgical history on file.  Family History  Problem Relation Age of Onset  . Hypertension Father   . Hyperlipidemia Father   . Hypertension Paternal Grandmother   . Hypertension Paternal Grandfather   . Hypertension Brother     identical twin  . Psychosis Brother     "mental issues"      Allergies  Allergen Reactions  . Geodon [Ziprasidone Hcl]     Made his throat real dry per pt.    Current Outpatient Prescriptions on File Prior to Visit  Medication Sig Dispense Refill  .  Asenapine Maleate (SAPHRIS) 10 MG SUBL Place 1 tablet (10 mg total) under the tongue 2 (two) times daily. (Patient taking differently: Place 10 mg under the tongue daily. ) 60 tablet   . B Complex Vitamins (VITAMIN B COMPLEX PO) Take 1 tablet by mouth daily. Reported on 02/15/2016    . co-enzyme Q-10 30 MG capsule Take 2 capsules (60 mg total) by mouth daily.    . folic acid (FOLVITE) 1 MG tablet Take 1 mg by mouth 2 (two) times daily.    . hydrochlorothiazide (MICROZIDE) 12.5 MG capsule Take 1 capsule (12.5 mg total) by mouth daily. For blood pressure control. 30 capsule 0  . Multiple Vitamins-Minerals (MULTIVITAMIN & MINERAL PO) Take 1 tablet by mouth daily.    . traZODone (DESYREL) 100 MG tablet Take 1 tablet (100 mg total) by mouth at bedtime as needed for sleep (may  repeat x 1). 60 tablet 0  . vitamin C (ASCORBIC ACID) 500 MG tablet Take 500 mg by mouth daily.     No current facility-administered medications on file prior to visit.    BP 120/85 mmHg  Pulse 97  Temp(Src) 98.4 F (36.9 C) (Oral)  Resp 16  Ht  (1.626 m)  Wt 218 lb 6.4 oz (99.066 kg)  BMI 37.47 kg/m2  SpO2 98%       Objective:   Physical Exam  Constitutional: He is oriented to person, place, and time. He appears well-developed and well-nourished. No distress.  HENT:  Head: Normocephalic and atraumatic.  Eyes: No scleral icterus.  Cardiovascular: Normal rate and regular rhythm.   No murmur heard. Pulmonary/Chest: Effort normal and breath sounds normal. No respiratory distress. He has no wheezes. He has no rales.  Musculoskeletal: He exhibits no edema.  Lymphadenopathy:    He has no cervical adenopathy.  Neurological: He is alert and oriented to person, place, and time.  Skin: Skin is warm and dry.  Psychiatric: He has a normal mood and affect. His behavior is normal. Thought content normal.          Assessment & Plan:  Constipation- Advised increased fiber and water intake to help with constipation.

## 2016-02-18 NOTE — Assessment & Plan Note (Signed)
Patient is managed by psychiatry ([facility]) and reports that his mood is good.

## 2016-02-19 ENCOUNTER — Encounter: Payer: Self-pay | Admitting: Family

## 2016-03-25 ENCOUNTER — Other Ambulatory Visit: Payer: Self-pay | Admitting: Family

## 2016-03-25 MED ORDER — HYDROCHLOROTHIAZIDE 12.5 MG PO CAPS: 12.5000 mg | ORAL_CAPSULE | Freq: Every day | ORAL | Status: DC

## 2016-03-25 MED ORDER — LOVASTATIN 20 MG PO TABS: 20.0000 mg | ORAL_TABLET | Freq: Every day | ORAL | Status: DC

## 2016-03-25 NOTE — Telephone Encounter (Signed)
Pt says that he is out of town and need a Rx refill for 2 medications. 1. Hydrochlorothiazide           2. lovastatin   Pharmacy: [facility] in [place] on [address]    Phone : [phone]

## 2016-03-25 NOTE — Telephone Encounter (Signed)
30 day supply for each sent,notified pt.

## 2016-04-07 ENCOUNTER — Telehealth: Payer: Self-pay | Admitting: Family

## 2016-04-07 ENCOUNTER — Encounter: Payer: Commercial Managed Care - HMO | Admitting: Family

## 2016-04-07 DIAGNOSIS — Z0289 Encounter for other administrative examinations: Secondary | ICD-10-CM

## 2016-04-08 ENCOUNTER — Encounter: Payer: Self-pay | Admitting: Family

## 2016-04-08 NOTE — Telephone Encounter (Signed)
Yes please

## 2016-04-08 NOTE — Telephone Encounter (Signed)
Pt was no show 04/07/16 10:00am for cpe, pt has not rescheduled, 1st no show, charge or no charge?

## 2016-04-08 NOTE — Telephone Encounter (Signed)
Marked to charge and mailing no show letter °

## 2016-08-06 DIAGNOSIS — F250 Schizoaffective disorder, bipolar type: Secondary | ICD-10-CM | POA: Diagnosis not present

## 2016-09-16 ENCOUNTER — Encounter (HOSPITAL_COMMUNITY): Payer: Self-pay | Admitting: Emergency Medicine

## 2016-09-16 ENCOUNTER — Emergency Department (HOSPITAL_COMMUNITY)
Admission: EM | Admit: 2016-09-16 | Discharge: 2016-09-16 | Disposition: A | Discharge status: Home / Self Care | Payer: Commercial Managed Care - HMO | Attending: Emergency Medicine | Admitting: Emergency Medicine

## 2016-09-16 DIAGNOSIS — Z79899 Other long term (current) drug therapy: Secondary | ICD-10-CM | POA: Insufficient documentation

## 2016-09-16 DIAGNOSIS — I10 Essential (primary) hypertension: Secondary | ICD-10-CM | POA: Insufficient documentation

## 2016-09-16 DIAGNOSIS — F17210 Nicotine dependence, cigarettes, uncomplicated: Secondary | ICD-10-CM | POA: Diagnosis not present

## 2016-09-16 DIAGNOSIS — M5442 Lumbago with sciatica, left side: Secondary | ICD-10-CM | POA: Diagnosis not present

## 2016-09-16 DIAGNOSIS — M545 Low back pain: Secondary | ICD-10-CM | POA: Diagnosis present

## 2016-09-16 MED ORDER — IBUPROFEN 800 MG PO TABS: 800.0000 mg | ORAL_TABLET | Freq: Three times a day (TID) | ORAL | 0 refills | Status: DC

## 2016-09-16 MED ORDER — CYCLOBENZAPRINE HCL 10 MG PO TABS: 10.0000 mg | ORAL_TABLET | Freq: Two times a day (BID) | ORAL | 0 refills | Status: DC | PRN

## 2016-09-16 MED ORDER — CYCLOBENZAPRINE HCL 10 MG PO TABS
10.0000 mg | ORAL_TABLET | Freq: Once | ORAL | Status: AC
  Administered 2016-09-16: 10 mg via ORAL
  Filled 2016-09-16: qty 1

## 2016-09-16 MED ORDER — IBUPROFEN 800 MG PO TABS
800.0000 mg | ORAL_TABLET | Freq: Once | ORAL | Status: AC
  Administered 2016-09-16: 800 mg via ORAL
  Filled 2016-09-16: qty 1

## 2016-09-16 NOTE — ED Triage Notes (Signed)
Patient here with complaints of lower back pain radiating down into left leg. Reports history of sciatica. Ibuprofen without relief. States that he is out of flexeril, which usually helps. Pain 10/10.

## 2016-09-16 NOTE — Discharge Instructions (Signed)
SEEK IMMEDIATE MEDICAL ATTENTION IF: New numbness, tingling, weakness, or problem with the use of your arms or legs.  Severe back pain not relieved with medications.  Change in bowel or bladder control.  Increasing pain in any areas of the body (such as chest or abdominal pain).  Shortness of breath, dizziness or fainting.  Nausea (feeling sick to your stomach), vomiting, fever, or sweats.  

## 2016-09-16 NOTE — ED Provider Notes (Signed)
[facility]-EMERGENCY DEPT Provider Note   CSN: [id number] Arrival date & time: 09/16/16  1528   By signing my name below, I, Freida Busman, attest that this documentation has been prepared under the direction and in the presence of non-physician practitioner, Arthor Captain, PA-C. Electronically Signed: Freida Busman, Scribe. 09/16/2016. 5:25 PM.   History   Chief Complaint Chief Complaint  Patient presents with  . Back Pain  . Leg Pain    The history is provided by the patient. No language interpreter was used.    HPI Comments: Level 5 caveat due to psychosis.  Lucas Knapp is a 30 y.o. male who presents to the Emergency Department complaining of 10/10 lower back with radiation down his left leg. He has been experiencing the same pain since 2002. Pt denies numbness to his grown. Pt notes he is out of his Flexeril which usually provides relief. Pt notes he has a h/o sciatica but states that he has it because "they" put wires in his body that are connected to statues. He repeatedly states in the past he has been given morphine, and percocet which he didn't want and states they were only given to him to keep him in a fog and so that the doctors "with their evil ass" could embezzle his money. He also states they killed his grandmother.   Past Medical History:  Diagnosis Date  . Back pain   . Bipolar 1 disorder (HCC)   . Hyperlipidemia   . Hypertension    pt has been prescribed HCTZ 12.5 mg daily. Pt was 140/80 on admission.   . Schizophrenia [facility])     Patient Active Problem List   Diagnosis Date Noted  . HTN (hypertension) 02/18/2016  . Hyperlipidemia 02/18/2016  . Fatigue 02/18/2016  . Low back pain 02/18/2016  . Abdominal pain 11/27/2014  . Schizophrenia, paranoid type (HCC) 09/17/2012    Class: Acute  . Cannabis abuse 09/17/2012    History reviewed. No pertinent surgical history.     Home Medications    Prior to Admission medications   Medication Sig Start Date  End Date Taking? Authorizing Provider  Asenapine Maleate (SAPHRIS) 10 MG SUBL Place 1 tablet (10 mg total) under the tongue 2 (two) times daily. Patient taking differently: Place 10 mg under the tongue daily.  11/27/14   Maurice March, NP  B Complex Vitamins (VITAMIN B COMPLEX PO) Take 1 tablet by mouth daily. Reported on 02/15/2016    Historical Provider, MD  co-enzyme Q-10 30 MG capsule Take 2 capsules (60 mg total) by mouth daily. 11/27/14   Maurice March, NP  folic acid (FOLVITE) 1 MG tablet Take 1 mg by mouth 2 (two) times daily.    Historical Provider, MD  hydrochlorothiazide (MICROZIDE) 12.5 MG capsule Take 1 capsule (12.5 mg total) by mouth daily. For blood pressure control. 03/25/16   Sandford Craze, NP  ibuprofen (ADVIL,MOTRIN) 800 MG tablet Take 1 tablet (800 mg total) by mouth every 8 (eight) hours as needed for moderate pain. 02/18/16   Sandford Craze, NP  lovastatin (MEVACOR) 20 MG tablet Take 1 tablet (20 mg total) by mouth at bedtime. 03/25/16   Sandford Craze, NP  Multiple Vitamins-Minerals (MULTIVITAMIN & MINERAL PO) Take 1 tablet by mouth daily.    Historical Provider, MD  traZODone (DESYREL) 100 MG tablet Take 1 tablet (100 mg total) by mouth at bedtime as needed for sleep (may repeat x 1). 11/27/14   Maurice March, NP  vitamin C (ASCORBIC ACID)  500 MG tablet Take 500 mg by mouth daily.    Historical Provider, MD    Family History Family History  Problem Relation Age of Onset  . Hypertension Father   . Hyperlipidemia Father   . Hypertension Paternal Grandmother   . Hypertension Paternal Grandfather   . Hypertension Brother     identical twin  . Psychosis Brother     "mental issues"      Social History Social History  Substance Use Topics  . Smoking status: Current Every Day Smoker    Types: Cigarettes  . Smokeless tobacco: Never Used     Comment: 5 cigarettes a day  . Alcohol use 0.0 oz/week     Comment: occasionally     Allergies   Geodon  [ziprasidone hcl]   Review of Systems Review of Systems  Constitutional: Negative for chills and fever.  Respiratory: Negative for shortness of breath.   Cardiovascular: Negative for chest pain.  Musculoskeletal: Positive for back pain and myalgias.  Neurological: Negative for numbness.  ROS unreliable due to Psychosis   Physical Exam Updated Vital Signs BP (!) 140/104 (BP Location: Left Arm) Comment: pt states he has run out of his HTN meds  Pulse 107   Temp 99 F (37.2 C) (Oral)   Resp 15   Ht 5\' 6"  (1.676 m)   Wt 220 lb (99.8 kg)   SpO2 96%   BMI 35.51 kg/m   Physical Exam  Constitutional: He is oriented to person, place, and time. He appears well-developed and well-nourished. No distress.  HENT:  Head: Normocephalic and atraumatic.  Eyes: Conjunctivae are normal.  Cardiovascular: Normal rate.   Pulmonary/Chest: Effort normal.  Abdominal: He exhibits no distension.  Musculoskeletal:  Patient appears to be in mild to moderate pain, antalgic gait noted. Lumbosacral spine area reveals no local tenderness or mass.  Painful and reduced LS ROM noted. Straight leg raise DTR's, motor strength and sensation normal, including heel and toe gait.  Peripheral pulses are palpable.   Neurological: He is alert and oriented to person, place, and time.  Skin: Skin is warm and dry.  Psychiatric: Thought content is paranoid and delusional. He expresses inappropriate judgment.  Nursing note and vitals reviewed.    ED Treatments / Results  DIAGNOSTIC STUDIES:  Oxygen Saturation is 96% on RA, normal by my interpretation.    COORDINATION OF CARE:  5:27 PM Discussed treatment plan with pt at bedside and pt agreed to plan.  Labs (all labs ordered are listed, but only abnormal results are displayed) Labs Reviewed - No data to display  EKG  EKG Interpretation None       Radiology No results found.  Procedures Procedures (including critical care time)  Medications Ordered in  ED Medications - No data to display   Initial Impression / Assessment and Plan / ED Course  I have reviewed the triage vital signs and the nursing notes.  Pertinent labs & imaging results that were available during my care of the patient were reviewed by me and considered in my medical decision making (see chart for details).  Clinical Course     Patient clearly psychotic but   No neurological deficits and normal neuro exam.  Patient is ambulatory.  No loss of bowel or bladder control.  No concern for cauda equina.  No fever, night sweats, weight loss, h/o cancer, IVDA, no recent procedure to back. No urinary symptoms suggestive of UTI.  Supportive care and return precaution discussed. Appears safe for  discharge at this time. Follow up as indicated in discharge paperwork.   Final Clinical Impressions(s) / ED Diagnoses   Final diagnoses:  Left-sided low back pain with left-sided sciatica    New Prescriptions New Prescriptions   No medications on file       Arthor Captainbigail Ciaira Natividad, PA-C 09/16/16 2144    Canary Brimhristopher J Tegeler, MD 09/17/16 1139

## 2016-09-16 NOTE — ED Triage Notes (Signed)
Pt is eating his dinner and watching TV. Will discharge pt after he eats

## 2016-09-29 ENCOUNTER — Ambulatory Visit (INDEPENDENT_AMBULATORY_CARE_PROVIDER_SITE_OTHER): Payer: Commercial Managed Care - HMO | Admitting: Family

## 2016-09-29 ENCOUNTER — Encounter: Payer: Self-pay | Admitting: Family

## 2016-09-29 VITALS — BP 141/90 | HR 73 | Temp 98.2°F | Resp 16 | Ht 64.0 in | Wt 210.8 lb

## 2016-09-29 DIAGNOSIS — G8929 Other chronic pain: Secondary | ICD-10-CM

## 2016-09-29 DIAGNOSIS — I10 Essential (primary) hypertension: Secondary | ICD-10-CM | POA: Diagnosis not present

## 2016-09-29 DIAGNOSIS — Z0001 Encounter for general adult medical examination with abnormal findings: Secondary | ICD-10-CM

## 2016-09-29 DIAGNOSIS — M545 Low back pain: Secondary | ICD-10-CM

## 2016-09-29 DIAGNOSIS — Z0000 Encounter for general adult medical examination without abnormal findings: Secondary | ICD-10-CM

## 2016-09-29 LAB — CBC WITH DIFFERENTIAL/PLATELET
BASOS PCT: 0.7 % (ref 0.0–3.0)
Basophils Absolute: 0 10*3/uL (ref 0.0–0.1)
EOS ABS: 0.2 10*3/uL (ref 0.0–0.7)
Eosinophils Relative: 3.4 % (ref 0.0–5.0)
HEMATOCRIT: 45.3 % (ref 39.0–52.0)
HEMOGLOBIN: 15.7 g/dL (ref 13.0–17.0)
Lymphs Abs: 3.5 10*3/uL (ref 0.7–4.0)
MCHC: 34.6 g/dL (ref 30.0–36.0)
MCV: 90.2 fl (ref 78.0–100.0)
MONO ABS: 0.4 10*3/uL (ref 0.1–1.0)
Monocytes Relative: 6 % (ref 3.0–12.0)
Neutro Abs: 1.9 10*3/uL (ref 1.4–7.7)
Neutrophils Relative %: 31.5 % — ABNORMAL LOW (ref 43.0–77.0)
Platelets: 384 10*3/uL (ref 150.0–400.0)
RBC: 5.02 Mil/uL (ref 4.22–5.81)
RDW: 12.4 % (ref 11.5–15.5)
WBC: 6 10*3/uL (ref 4.0–10.5)

## 2016-09-29 LAB — BASIC METABOLIC PANEL
BUN: 4 mg/dL — ABNORMAL LOW (ref 6–23)
CALCIUM: 10 mg/dL (ref 8.4–10.5)
CO2: 29 mEq/L (ref 19–32)
Chloride: 103 mEq/L (ref 96–112)
Creatinine, Ser: 0.99 mg/dL (ref 0.40–1.50)
GFR: 112.66 mL/min (ref >60.00)
Glucose, Bld: 83 mg/dL (ref 70–99)
POTASSIUM: 4.2 meq/L (ref 3.5–5.1)
SODIUM: 138 meq/L (ref 135–145)

## 2016-09-29 LAB — LIPID PANEL
CHOL/HDL RATIO: 6
Cholesterol: 226 mg/dL — ABNORMAL HIGH (ref 0–200)
HDL: 40.4 mg/dL (ref >39.00)
LDL Cholesterol: 150 mg/dL — ABNORMAL HIGH (ref 0–99)
NonHDL: 185.66
TRIGLYCERIDES: 178 mg/dL — AB (ref 0.0–149.0)
VLDL: 35.6 mg/dL (ref 0.0–40.0)

## 2016-09-29 LAB — URINALYSIS, ROUTINE W REFLEX MICROSCOPIC
BILIRUBIN URINE: NEGATIVE
Hgb urine dipstick: NEGATIVE
KETONES UR: NEGATIVE
Leukocytes, UA: NEGATIVE
Nitrite: NEGATIVE
PH: 7 (ref 5.0–8.0)
RBC / HPF: NONE SEEN (ref 0–?)
SPECIFIC GRAVITY, URINE: 1.02 (ref 1.000–1.030)
TOTAL PROTEIN, URINE-UPE24: NEGATIVE
Urine Glucose: NEGATIVE
Urobilinogen, UA: 0.2 (ref 0.0–1.0)
WBC UA: NONE SEEN (ref 0–?)

## 2016-09-29 LAB — HEPATIC FUNCTION PANEL
ALT: 27 U/L (ref 0–53)
AST: 29 U/L (ref 0–37)
Albumin: 4.2 g/dL (ref 3.5–5.2)
Alkaline Phosphatase: 63 U/L (ref 39–117)
BILIRUBIN DIRECT: 0 mg/dL (ref 0.0–0.3)
Total Bilirubin: 0.4 mg/dL (ref 0.2–1.2)
Total Protein: 7.2 g/dL (ref 6.0–8.3)

## 2016-09-29 LAB — TSH: TSH: 2.11 u[IU]/mL (ref 0.35–4.50)

## 2016-09-29 MED ORDER — HYDROCHLOROTHIAZIDE 12.5 MG PO CAPS: 12.5000 mg | ORAL_CAPSULE | Freq: Every day | ORAL | 1 refills | Status: DC

## 2016-09-29 MED ORDER — LOVASTATIN 20 MG PO TABS: 20.0000 mg | ORAL_TABLET | Freq: Every day | ORAL | 1 refills | Status: DC

## 2016-09-29 NOTE — Progress Notes (Signed)
Pre visit review using our clinic review tool, if applicable. No additional management support is needed unless otherwise documented below in the visit note. 

## 2016-09-29 NOTE — Assessment & Plan Note (Signed)
Uncontrolled. Resume hctz. 

## 2016-09-29 NOTE — Assessment & Plan Note (Signed)
Will refer to PT.  He is interested in water aerobics which he has done through PT in the past.

## 2016-09-29 NOTE — Assessment & Plan Note (Signed)
Discussed healthy diet, exercise, weight loss.  Declines flu shot today. Obtain routine lab work.

## 2016-09-29 NOTE — Patient Instructions (Addendum)
You will be contacted about your referral to physical therapy. Restart hctz for your blood pressure and lovastatin for your cholesterol.  Continue your work on [employer] diet, exercise, and weight loss.

## 2016-09-29 NOTE — Progress Notes (Signed)
Subjective:    Patient ID: Lucas Knapp, male    DOB: [birth year], 30 y.o.   MRN: [id number]  HPI  Patient presents today for complete physical.  Immunizations: declines flu shot today. Wishes to do another day. Tetanus up to date. Diet: diet is healthy, trying ot improve his eating habits Exercise: reports not exercising due to work schedule  HTN-  Ran out of his hctz.   BP Readings from Last 3 Encounters:  09/29/16 (!) 141/90  09/16/16 (!) 140/104  02/18/16 120/85   Reports that he has chronic low back pain, requests referral to PT.   Wt Readings from Last 3 Encounters:  09/29/16 210 lb 12.8 oz (95.6 kg)  09/16/16 220 lb (99.8 kg)  02/18/16 218 lb 6.4 oz (99.1 kg)      Review of Systems  Constitutional: Negative for unexpected weight change.  HENT: Negative for hearing loss and rhinorrhea.   Eyes: Negative for visual disturbance.  Respiratory:       Mild cough, attributes to allergies  Cardiovascular: Negative for leg swelling.  Gastrointestinal: Negative for diarrhea.       Occasional constipation   Genitourinary: Negative for dysuria and frequency.  Musculoskeletal: Positive for back pain.  Skin:       Dry skin left AC fossa  Neurological:       Occasional HA if he skips meals  Psychiatric/Behavioral:       Reports good mood.    Past Medical History:  Diagnosis Date  . Back pain   . Bipolar 1 disorder (HCC)   . Hyperlipidemia   . Hypertension    pt has been prescribed HCTZ 12.5 mg daily. Pt was 140/80 on admission.   . Schizophrenia [facility])      Social History   Social History  . Marital status: Single    Spouse name: N/A  . Number of children: N/A  . Years of education: N/A   Occupational History  . Not on file.   Social History Main Topics  . Smoking status: Current Every Day Smoker    Types: Cigarettes  . Smokeless tobacco: Never Used     Comment: 10-15  . Alcohol use 0.0 oz/week     Comment: occasionally  . Drug use:     Types:  Marijuana  . Sexual activity: Not on file   Other Topics Concern  . 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    Single   No children   Completed college (bachelors in Statistician)   Enjoys swimming, playing pool, walking   Grew up [place].  Has identical twin and 2 other brothers in [place]    History reviewed. No pertinent surgical history.  Family History  Problem Relation Age of Onset  . Hypertension Father   . Hyperlipidemia Father   . Hypertension Paternal Grandmother   . Hypertension Paternal Grandfather   . Hypertension Brother     identical twin  . Psychosis Brother     "mental issues"      Allergies  Allergen Reactions  . Geodon [Ziprasidone Hcl]     Made his throat real dry per pt.    Current Outpatient Prescriptions on File Prior to Visit  Medication Sig Dispense Refill  . Asenapine Maleate (SAPHRIS) 10 MG SUBL Place 1 tablet (10 mg total) under the tongue 2 (two) times daily. (  Patient taking differently: Place 10 mg under the tongue daily. ) 60 tablet   . B Complex Vitamins (VITAMIN B COMPLEX PO) Take 1 tablet by mouth daily. Reported on 02/15/2016    . co-enzyme Q-10 30 MG capsule Take 2 capsules (60 mg total) by mouth daily.    . cyclobenzaprine (FLEXERIL) 10 MG tablet Take 1 tablet (10 mg total) by mouth 2 (two) times daily as needed for muscle spasms. 20 tablet 0  . folic acid (FOLVITE) 1 MG tablet Take 1 mg by mouth 2 (two) times daily.    Marland Kitchen. ibuprofen (ADVIL,MOTRIN) 800 MG tablet Take 1 tablet (800 mg total) by mouth 3 (three) times daily. 21 tablet 0  . Multiple Vitamins-Minerals (MULTIVITAMIN & MINERAL PO) Take 1 tablet by mouth daily.    . traZODone (DESYREL) 100 MG tablet Take 1 tablet (100 mg total) by mouth at bedtime as needed for sleep (may repeat x 1). 60 tablet 0  . vitamin C (ASCORBIC ACID) 500 MG tablet Take 500 mg by mouth daily.      No current facility-administered medications on file prior to visit.     BP (!) 141/90 (BP Location: Right Arm, Cuff Size: Large)   Pulse 73   Temp 98.2 F (36.8 C) (Oral)   Resp 16   Ht 5\' 4"  (1.626 m)   Wt 210 lb 12.8 oz (95.6 kg)   SpO2 100% Comment: room air  BMI 36.18 kg/m       Objective:   Physical Exam Physical Exam  Constitutional: He is oriented to person, place, and time. He appears well-developed and well-nourished. No distress.  HENT:  Head: Normocephalic and atraumatic.  Right Ear: Tympanic membrane and ear canal normal.  Left Ear: Tympanic membrane and ear canal normal.  Mouth/Throat: Oropharynx is clear and moist.  Eyes: Pupils are equal, round, and reactive to light. No scleral icterus.  Neck: Normal range of motion. No thyromegaly present.  Cardiovascular: Normal rate and regular rhythm.   No murmur heard. Pulmonary/Chest: Effort normal and breath sounds normal. No respiratory distress. He has no wheezes. He has no rales. He exhibits no tenderness.  Abdominal: Soft. Bowel sounds are normal. He exhibits no distension and no mass. There is no tenderness. There is no rebound and no guarding.  Musculoskeletal: He exhibits no edema.  Lymphadenopathy:    He has no cervical adenopathy.  Neurological: He is alert and oriented to person, place, and time. He has normal patellar reflexes. He exhibits normal muscle tone. Coordination normal.  Skin: Skin is warm and dry.  Psychiatric: He has a normal mood and affect. His behavior is normal. Judgment and thought content normal.          Assessment & Plan:          Assessment & Plan:

## 2016-09-30 ENCOUNTER — Other Ambulatory Visit: Payer: Self-pay

## 2016-09-30 ENCOUNTER — Encounter: Payer: Self-pay | Admitting: Family

## 2016-09-30 DIAGNOSIS — E785 Hyperlipidemia, unspecified: Secondary | ICD-10-CM

## 2016-09-30 NOTE — Progress Notes (Unsigned)
See lab note.  

## 2016-10-09 DIAGNOSIS — M545 Low back pain: Secondary | ICD-10-CM | POA: Diagnosis not present

## 2016-11-11 ENCOUNTER — Other Ambulatory Visit: Payer: Self-pay

## 2016-12-30 ENCOUNTER — Ambulatory Visit: Payer: Self-pay | Admitting: Family

## 2017-01-04 ENCOUNTER — Ambulatory Visit (HOSPITAL_COMMUNITY)
Admission: RE | Admit: 2017-01-04 | Discharge: 2017-01-04 | Disposition: A | Discharge status: Home / Self Care | Payer: Medicare HMO | Attending: Psychiatry | Admitting: Psychiatry

## 2017-01-04 ENCOUNTER — Encounter (HOSPITAL_COMMUNITY): Payer: Self-pay | Admitting: Behavioral Health

## 2017-01-04 ENCOUNTER — Emergency Department (HOSPITAL_COMMUNITY)
Admission: EM | Admit: 2017-01-04 | Discharge: 2017-01-05 | Disposition: A | Discharge status: Other Acute Inpatient Hospital | Payer: Medicare HMO | Attending: Emergency Medicine | Admitting: Emergency Medicine

## 2017-01-04 DIAGNOSIS — F29 Unspecified psychosis not due to a substance or known physiological condition: Secondary | ICD-10-CM | POA: Diagnosis not present

## 2017-01-04 DIAGNOSIS — F200 Paranoid schizophrenia: Secondary | ICD-10-CM | POA: Diagnosis present

## 2017-01-04 DIAGNOSIS — Z79899 Other long term (current) drug therapy: Secondary | ICD-10-CM | POA: Diagnosis not present

## 2017-01-04 DIAGNOSIS — F17210 Nicotine dependence, cigarettes, uncomplicated: Secondary | ICD-10-CM | POA: Insufficient documentation

## 2017-01-04 DIAGNOSIS — I10 Essential (primary) hypertension: Secondary | ICD-10-CM | POA: Insufficient documentation

## 2017-01-04 DIAGNOSIS — R4182 Altered mental status, unspecified: Secondary | ICD-10-CM | POA: Diagnosis not present

## 2017-01-04 LAB — COMPREHENSIVE METABOLIC PANEL
ALT: 38 U/L (ref 17–63)
AST: 51 U/L — ABNORMAL HIGH (ref 15–41)
Albumin: 5 g/dL (ref 3.5–5.0)
Alkaline Phosphatase: 66 U/L (ref 38–126)
Anion gap: 9 (ref 5–15)
BUN: 9 mg/dL (ref 6–20)
CHLORIDE: 101 mmol/L (ref 101–111)
CO2: 27 mmol/L (ref 22–32)
CREATININE: 0.98 mg/dL (ref 0.61–1.24)
Calcium: 9.8 mg/dL (ref 8.9–10.3)
GFR calc non Af Amer: >60 mL/min (ref >60)
Glucose, Bld: 88 mg/dL (ref 65–99)
Potassium: 4 mmol/L (ref 3.5–5.1)
SODIUM: 137 mmol/L (ref 135–145)
Total Bilirubin: 1 mg/dL (ref 0.3–1.2)
Total Protein: 8.2 g/dL — ABNORMAL HIGH (ref 6.5–8.1)

## 2017-01-04 LAB — CBC
HCT: 45.1 % (ref 39.0–52.0)
HEMOGLOBIN: 16.3 g/dL (ref 13.0–17.0)
MCH: 31.2 pg (ref 26.0–34.0)
MCHC: 36.1 g/dL — ABNORMAL HIGH (ref 30.0–36.0)
MCV: 86.2 fL (ref 78.0–100.0)
Platelets: 332 10*3/uL (ref 150–400)
RBC: 5.23 MIL/uL (ref 4.22–5.81)
RDW: 11.8 % (ref 11.5–15.5)
WBC: 8.4 10*3/uL (ref 4.0–10.5)

## 2017-01-04 LAB — RAPID URINE DRUG SCREEN, HOSP PERFORMED
Amphetamines: NOT DETECTED
Barbiturates: NOT DETECTED
Benzodiazepines: NOT DETECTED
Cocaine: NOT DETECTED
OPIATES: NOT DETECTED
TETRAHYDROCANNABINOL: NOT DETECTED

## 2017-01-04 LAB — ETHANOL: Alcohol, Ethyl (B): <5 mg/dL (ref <5)

## 2017-01-04 LAB — SALICYLATE LEVEL

## 2017-01-04 LAB — ACETAMINOPHEN LEVEL: Acetaminophen (Tylenol), Serum: <10 ug/mL — ABNORMAL LOW (ref 10–30)

## 2017-01-04 MED ORDER — IBUPROFEN 200 MG PO TABS
600.0000 mg | ORAL_TABLET | Freq: Three times a day (TID) | ORAL | Status: DC | PRN
  Filled 2017-01-04: qty 3

## 2017-01-04 MED ORDER — NICOTINE 21 MG/24HR TD PT24: 21.0000 mg | MEDICATED_PATCH | Freq: Every day | TRANSDERMAL | Status: DC | PRN

## 2017-01-04 MED ORDER — LORAZEPAM 1 MG PO TABS
2.0000 mg | ORAL_TABLET | Freq: Once | ORAL | Status: AC
  Administered 2017-01-04: 2 mg via ORAL
  Filled 2017-01-04: qty 2

## 2017-01-04 MED ORDER — ALUM & MAG HYDROXIDE-SIMETH 200-200-20 MG/5ML PO SUSP: 30.0000 mL | ORAL | Status: DC | PRN

## 2017-01-04 MED ORDER — ASENAPINE MALEATE 5 MG SL SUBL
10.0000 mg | SUBLINGUAL_TABLET | Freq: Two times a day (BID) | SUBLINGUAL | Status: DC
  Administered 2017-01-04 – 2017-01-05 (×2): 10 mg via SUBLINGUAL
  Filled 2017-01-04 (×3): qty 1

## 2017-01-04 MED ORDER — ACETAMINOPHEN 325 MG PO TABS
650.0000 mg | ORAL_TABLET | ORAL | Status: DC | PRN
  Administered 2017-01-05: 650 mg via ORAL
  Filled 2017-01-04: qty 2

## 2017-01-04 MED ORDER — ZOLPIDEM TARTRATE 5 MG PO TABS: 5.0000 mg | ORAL_TABLET | Freq: Every evening | ORAL | Status: DC | PRN

## 2017-01-04 MED ORDER — OLANZAPINE 10 MG PO TBDP
10.0000 mg | ORAL_TABLET | Freq: Every day | ORAL | Status: DC
  Administered 2017-01-04: 10 mg via ORAL
  Filled 2017-01-04: qty 1

## 2017-01-04 MED ORDER — TRAZODONE HCL 100 MG PO TABS: 100.0000 mg | ORAL_TABLET | Freq: Every evening | ORAL | Status: DC | PRN

## 2017-01-04 MED ORDER — ONDANSETRON HCL 4 MG PO TABS: 4.0000 mg | ORAL_TABLET | Freq: Three times a day (TID) | ORAL | Status: DC | PRN

## 2017-01-04 MED ORDER — PRAVASTATIN SODIUM 20 MG PO TABS
20.0000 mg | ORAL_TABLET | Freq: Every day | ORAL | Status: DC
  Administered 2017-01-05: 20 mg via ORAL
  Filled 2017-01-04: qty 1

## 2017-01-04 NOTE — Consult Note (Signed)
Behavioral Health Medical Screening Exam  Lucas Knapp is an 31 y.o. male.  Total Time spent with patient: 15 minutes  Psychiatric Specialty Exam: Physical Exam  Constitutional: He is oriented to person, place, and time. He appears well-developed and well-nourished.  HENT:  Head: Normocephalic and atraumatic.  Eyes: Conjunctivae are normal. Pupils are equal, round, and reactive to light.  Neck: Normal range of motion.  Cardiovascular: Normal rate, regular rhythm and normal heart sounds.   Respiratory: Effort normal and breath sounds normal.  GI: Soft. Bowel sounds are normal.  Musculoskeletal: Normal range of motion.  Neurological: He is alert and oriented to person, place, and time.  Skin: Skin is warm and dry.  Psychiatric:  Bizarre     Review of Systems  Psychiatric/Behavioral: Positive for depression and hallucinations. Negative for substance abuse and suicidal ideas. The patient is nervous/anxious and has insomnia.   All other systems reviewed and are negative.   There were no vitals taken for this visit.There is no height or weight on file to calculate BMI.  General Appearance: Bizarre and Disheveled  Eye Contact:  Fair  Speech:  Clear and Coherent and Normal Rate  Volume:  Increased  Mood:  Angry and Anxious  Affect:  Non-Congruent  Thought Process:  Disorganized, Irrelevant and Descriptions of Associations: Tangential  Orientation:  Full (Time, Place, and Person)  Thought Content:  Focused on random topics and back pain  Suicidal Thoughts:  No  Homicidal Thoughts:  No  Memory:  Immediate;   Fair Recent;   Fair Remote;   Fair  Judgement:  Fair  Insight:  Fair  Psychomotor Activity:  Normal  Concentration: Concentration: Fair and Attention Span: Fair  Recall:  [employer]Fair  Fund of Knowledge:Fair  Language: Fair  Akathisia:  No  Handed:    AIMS (if indicated):     Assets:  Communication Skills Desire for Improvement Resilience Social Support  Sleep:        Musculoskeletal: Strength & Muscle Tone: within normal limits Gait & Station: normal Patient leans: N/A  There were no vitals taken for this visit.  Recommendations:  Based on my evaluation the patient does not appear to have an emergency medical condition.  Beau FannyWithrow, John C, FNP 01/04/2017, 6:34 PM Agree with NP assessment

## 2017-01-04 NOTE — BH Assessment (Signed)
Assessment Note  Lucas Knapp is a 31 y.o. male who presented as a voluntary walk-in to [facility].  He was accompanied by his father and also his brother (with whom he lives).  Pt has a history of Schizophrenia and is treated through [place].  Pt was encouraged to [facility] by father due to increasingly bizarre, irritable, and agitated behavior.  Pt was assessed alone.  Collateral information provided by father.   Pt reported that he is at the hospital because of sinus issues and that he is increasingly irritable.  He denied suicidal or homicidal ideation, auditory/visual hallucination, and self-injury.  Pt has a history of alcohol and marijuana use, and when asked about it, Pt stated that he reported that he said he wants to drink more alcohol.  Pt endorsed growing anger and irritability (without trigger) and also a desire to be alone.  Author observed that Pt's speech was rambling, tangential, and at times incoherent.  Pt was unresponsive to some questions asked and then spoke at length about physical health concerns, concerns at work, and other matters.  Pt expressed a desire to enter a hospital "so I can chill."  Pt did not appear to have awareness of why he was in the hospital other than "I have a sinus issue."  Pt's father provided collateral information.  He stated that over the last few days/week, Pt has stopped taking his medication, and as a result, he has become increasingly agitated and irritable.  Per father, Pt has "done this before" -- stopped taking medication and required hospitalization.  During assessment, Pt presented as alert and oriented to place and person, but not time or situation.  He was dressed in street clothes and appeared disheveled.  Pt's demeanor was restless, and he was somewhat agitated (moving about the room, shuffling).  Pt denied suicidal/homicidal ideation, auditory/visual hallucination, self-injury and depressive symptoms.  Pt's behavior was bizarre -- he appeared agitated and  insisted that lights be off in the search room and also the bathroom when he used it.  Pt's speech was low, tangential, at times illogical and incoherent (including word salad).  Thought processes were within normal range.  As indicated, thought content was tangential.  Pt's memory and concentration were poor.  Pt's impulse control, judgment, and insight were poor.  Consulted with C Withrow DNP who recommended inpatient placement.  Diagnosis: Schizophrenia, Paranoid Type  Past Medical History:  Past Medical History:  Diagnosis Date  . Back pain   . Bipolar 1 disorder (HCC)   . Hyperlipidemia   . Hypertension    pt has been prescribed HCTZ 12.5 mg daily. Pt was 140/80 on admission.   . Schizophrenia (HCC)     No past surgical history on file.  Family History:  Family History  Problem Relation Age of Onset  . Hypertension Father   . Hyperlipidemia Father   . Hypertension Paternal Grandmother   . Hypertension Paternal Grandfather   . Hypertension Brother     identical twin  . Psychosis Brother     "mental issues"      Social History:  reports that he has been smoking Cigarettes.  He has never used smokeless tobacco. He reports that he drinks alcohol. He reports that he uses drugs, including Marijuana.  Additional Social History:  Alcohol / Drug Use Pain Medications: See PTA Prescriptions: See PTA Over the Counter: See PTA History of alcohol / drug use?: (S) Yes (Pt endorsed use of alcohol and etoh, but unspecified)  CIWA:  COWS:    Allergies:  Allergies  Allergen Reactions  . Geodon [Ziprasidone Hcl]     Made his throat real dry per pt.    Home Medications:  (Not in a hospital admission)  OB/GYN Status:  No LMP for male patient.  General Assessment Data Location of Assessment: [facility] Assessment Services TTS Assessment: In system Is this a Tele or Face-to-Face Assessment?: Face-to-Face Is this an Initial Assessment or a Re-assessment for this encounter?: Initial  Assessment Marital status: Single Living Arrangements: Other relatives (Lives with brother) Can pt return to current living arrangement?: Yes Admission Status: Voluntary Is patient capable of signing voluntary admission?: Yes Referral Source: Self/Family/Friend Insurance type: [employer]  Medical Screening Exam [facility] Walk-in ONLY) Medical Exam completed: Yes  Crisis Care Plan Living Arrangements: Other relatives (Lives with brother) Name of Psychiatrist: Transport planner Name of Therapist: Transport planner  Education Status Is patient currently in school?: No  Risk to self with the past 6 months Suicidal Ideation: No (Pt denied) Has patient been a risk to self within the past 6 months prior to admission? : No Suicidal Intent: No Has patient had any suicidal intent within the past 6 months prior to admission? : No Is patient at risk for suicide?: No Suicidal Plan?: No Has patient had any suicidal plan within the past 6 months prior to admission? : No Access to Means: No What has been your use of drugs/alcohol within the last 12 months?: Alcohol and marijuana Previous Attempts/Gestures: No Intentional Self Injurious Behavior: None Family Suicide History: Unknown Recent stressful life event(s): Other (Comment) (Non-compliant with meds) Persecutory voices/beliefs?: No Depression: Yes Depression Symptoms: Feeling angry/irritable, Isolating Substance abuse history and/or treatment for substance abuse?: Yes Suicide prevention information given to non-admitted patients: Not applicable  Risk to Others within the past 6 months Homicidal Ideation: No Does patient have any lifetime risk of violence toward others beyond the six months prior to admission? : No Thoughts of Harm to Others: No Current Homicidal Intent: No Current Homicidal Plan: No Access to Homicidal Means: No History of harm to others?: No Assessment of Violence: None Noted Does patient have access to weapons?: No Criminal Charges  Pending?: No Does patient have a court date: No Is patient on probation?: No  Psychosis Hallucinations: None noted (Pt denied; previously, family indicated that he had int. sti) Delusions: None noted  Mental Status Report Appearance/Hygiene: Layered clothes, Disheveled, Other (Comment) (Street clothes) Eye Contact: Fair Motor Activity: Unremarkable Speech: Incoherent, Tangential, Soft, Slow, Word salad Level of Consciousness: Alert Mood: Preoccupied, Irritable Affect: Preoccupied, Irritable Anxiety Level: None Thought Processes: Tangential, Flight of Ideas Judgement: Impaired Orientation: Person, Place Obsessive Compulsive Thoughts/Behaviors: None  Cognitive Functioning Concentration: Poor Memory: Remote Impaired, Recent Impaired IQ: Average Insight: Poor Impulse Control: Poor Appetite: Good Sleep: No Change  ADLScreening ([facility] Assessment Services) Patient's cognitive ability adequate to safely complete daily activities?: Yes Patient able to express need for assistance with ADLs?: Yes Independently performs ADLs?: Yes (appropriate for developmental age)  Prior Inpatient Therapy Prior Inpatient Therapy: Yes Prior Therapy Dates: 2014 and others Prior Therapy Facilty/Provider(s): [facility] Reason for Treatment: Schizophrenia  Prior Outpatient Therapy Prior Outpatient Therapy: Yes Prior Therapy Dates: Ongoing Prior Therapy Facilty/Provider(s): [name] Reason for Treatment: Schizophrenia Does patient have an ACCT team?: No Does patient have Intensive In-House Services?  : No Does patient have [name] services? : Yes Does patient have P4CC services?: No  ADL Screening (condition at time of admission) Patient's cognitive ability adequate to safely complete daily activities?: Yes  Is the patient deaf or have difficulty hearing?: No Does the patient have difficulty seeing, even when wearing glasses/contacts?: No Does the patient have difficulty concentrating, remembering, or  making decisions?: No Patient able to express need for assistance with ADLs?: Yes Does the patient have difficulty dressing or bathing?: No Independently performs ADLs?: Yes (appropriate for developmental age) Does the patient have difficulty walking or climbing stairs?: No Weakness of Legs: None Weakness of Arms/Hands: None  Home Assistive Devices/Equipment Home Assistive Devices/Equipment: None  Therapy Consults (therapy consults require a physician order) PT Evaluation Needed: No OT Evalulation Needed: No SLP Evaluation Needed: No Abuse/Neglect Assessment (Assessment to be complete while patient is alone) Physical Abuse:  (Unable to assess; Pt did not answer questions) Exploitation of patient/patient's resources: Denies Self-Neglect: Denies Values / Beliefs Cultural Requests During Hospitalization: None Spiritual Requests During Hospitalization: None Consults Spiritual Care Consult Needed: No Social Work Consult Needed: No Merchant navy officerAdvance Directives (For Healthcare) Does Patient Have a Medical Advance Directive?: No    Additional Information 1:1 In Past 12 Months?: No CIRT Risk: No Elopement Risk: No Does patient have medical clearance?: Yes     Disposition:  Disposition Initial Assessment Completed for this Encounter: Yes Disposition of Patient: Inpatient treatment program Type of inpatient treatment program: Adult (Per C Withrow, DNP, Pt meets inpt criteria)  On Site Evaluation by:   Reviewed with Physician:    Dorris FetchEugene T Raegyn Renda 01/04/2017 6:08 PM

## 2017-01-04 NOTE — ED Notes (Signed)
Bed: WTR5 Expected date:  Expected time:  Means of arrival:  Comments: 

## 2017-01-04 NOTE — ED Notes (Signed)
Bed: WHALB Expected date:  Expected time:  Means of arrival:  Comments: 

## 2017-01-04 NOTE — ED Provider Notes (Signed)
[facility]-EMERGENCY DEPT Provider Note   CSN: [id number] Arrival date & time: 01/04/17  1841     History   Chief Complaint Chief Complaint  Patient presents with  . Paranoid    HPI Lucas Knapp is a 31 y.o. male.  He presents for evaluation of bizarre behavior, delusions, and responding to internal stimuli. He apparently was at behavioral health Hospital, and was sent here for medical clearance. He is unable to give any history.  Level V caveat- altered mental status  HPI  Past Medical History:  Diagnosis Date  . Back pain   . Bipolar 1 disorder (HCC)   . Hyperlipidemia   . Hypertension    pt has been prescribed HCTZ 12.5 mg daily. Pt was 140/80 on admission.   . Schizophrenia [facility])     Patient Active Problem List   Diagnosis Date Noted  . Preventative health care 09/29/2016  . HTN (hypertension) 02/18/2016  . Hyperlipidemia 02/18/2016  . Fatigue 02/18/2016  . Low back pain 02/18/2016  . Abdominal pain 11/27/2014  . Schizophrenia, paranoid type (HCC) 09/17/2012    Class: Acute  . Cannabis abuse 09/17/2012    No past surgical history on file.     Home Medications    Prior to Admission medications   Medication Sig Start Date End Date Taking? Authorizing Provider  Asenapine Maleate (SAPHRIS) 10 MG SUBL Place 1 tablet (10 mg total) under the tongue 2 (two) times daily. Patient taking differently: Place 10 mg under the tongue daily.  11/27/14   Maurice March, NP  B Complex Vitamins (VITAMIN B COMPLEX PO) Take 1 tablet by mouth daily. Reported on 02/15/2016    Historical Provider, MD  co-enzyme Q-10 30 MG capsule Take 2 capsules (60 mg total) by mouth daily. 11/27/14   Maurice March, NP  cyclobenzaprine (FLEXERIL) 10 MG tablet Take 1 tablet (10 mg total) by mouth 2 (two) times daily as needed for muscle spasms. 09/16/16   Arthor Captain, PA-C  folic acid (FOLVITE) 1 MG tablet Take 1 mg by mouth 2 (two) times daily.    Historical Provider, MD    hydrochlorothiazide (MICROZIDE) 12.5 MG capsule Take 1 capsule (12.5 mg total) by mouth daily. For blood pressure control. 09/29/16   Sandford Craze, NP  ibuprofen (ADVIL,MOTRIN) 800 MG tablet Take 1 tablet (800 mg total) by mouth 3 (three) times daily. 09/16/16   Arthor Captain, PA-C  lovastatin (MEVACOR) 20 MG tablet Take 1 tablet (20 mg total) by mouth at bedtime. 09/29/16   Sandford Craze, NP  Multiple Vitamins-Minerals (MULTIVITAMIN & MINERAL PO) Take 1 tablet by mouth daily.    Historical Provider, MD  traZODone (DESYREL) 100 MG tablet Take 1 tablet (100 mg total) by mouth at bedtime as needed for sleep (may repeat x 1). 11/27/14   Maurice March, NP  vitamin C (ASCORBIC ACID) 500 MG tablet Take 500 mg by mouth daily.    Historical Provider, MD    Family History Family History  Problem Relation Age of Onset  . Hypertension Father   . Hyperlipidemia Father   . Hypertension Paternal Grandmother   . Hypertension Paternal Grandfather   . Hypertension Brother     identical twin  . Psychosis Brother     "mental issues"      Social History Social History  Substance Use Topics  . Smoking status: Current Every Day Smoker    Types: Cigarettes  . Smokeless tobacco: Never Used     Comment: 10-15  .  Alcohol use 0.0 oz/week     Comment: occasionally --"I need to drink more"     Allergies   Geodon [ziprasidone hcl]   Review of Systems Review of Systems  Unable to perform ROS: Mental status change     Physical Exam Updated Vital Signs BP (S) 146/99   Pulse 97   Temp 98.7 F (37.1 C) (Oral)   Resp 20   SpO2 98%   Physical Exam  Constitutional: He appears well-developed and well-nourished. He appears distressed (He is uncomfortable).  HENT:  Head: Normocephalic and atraumatic.  Right Ear: External ear normal.  Left Ear: External ear normal.  Eyes: Conjunctivae and EOM are normal. Pupils are equal, round, and reactive to light.  Neck: Normal range of motion and  phonation normal. Neck supple.  Cardiovascular: Normal rate.   Pulmonary/Chest: Effort normal. He exhibits no bony tenderness.  Musculoskeletal: Normal range of motion.  Neurological: He is alert. No cranial nerve deficit. He exhibits normal muscle tone. Coordination normal.  Skin: Skin is warm, dry and intact.  Psychiatric:  Patient talking to the wall, and the air, with confused speech, accusatory and hyperverbal.  Nursing note and vitals reviewed.    ED Treatments / Results  Labs (all labs ordered are listed, but only abnormal results are displayed) Labs Reviewed  COMPREHENSIVE METABOLIC PANEL - Abnormal; Notable for the following:       Result Value   Total Protein 8.2 (*)    AST 51 (*)    All other components within normal limits  ACETAMINOPHEN LEVEL - Abnormal; Notable for the following:    Acetaminophen (Tylenol), Serum <10 (*)    All other components within normal limits  CBC - Abnormal; Notable for the following:    MCHC 36.1 (*)    All other components within normal limits  ETHANOL  SALICYLATE LEVEL  RAPID URINE DRUG SCREEN, HOSP PERFORMED    EKG  EKG Interpretation None       Radiology No results found.  Procedures Procedures (including critical care time)  Medications Ordered in ED Medications  OLANZapine zydis (ZYPREXA) disintegrating tablet 10 mg (not administered)  LORazepam (ATIVAN) tablet 2 mg (not administered)     Initial Impression / Assessment and Plan / ED Course  I have reviewed the triage vital signs and the nursing notes.  Pertinent labs & imaging results that were available during my care of the patient were reviewed by me and considered in my medical decision making (see chart for details).  Clinical Course as of Jan 04 2125  Wynelle Link Jan 04, 2017  2125 The patient is medically cleared for treatment by psychiatry.  [EW]    Clinical Course User Index [EW] Mancel Bale, MD    Medications  OLANZapine zydis (ZYPREXA) disintegrating  tablet 10 mg (10 mg Oral Given 01/04/17 2120)  LORazepam (ATIVAN) tablet 2 mg (2 mg Oral Given 01/04/17 2120)    Patient Vitals for the past 24 hrs:  BP Temp Temp src Pulse Resp SpO2  01/04/17 2116 146/99 - - 97 20 98 %  01/04/17 1932 (!) 181/134 98.7 F (37.1 C) Oral (!) 121 18 98 %    TTS consult    Final Clinical Impressions(s) / ED Diagnoses   Final diagnoses:  Psychosis, unspecified psychosis type    Acute psychosis, with history of bipolar disorder. Unable to ascertain whether not he is taking his medicine or following up with his psychiatrist. Blood pressure was initially high, but improved spontaneously. It is likely  elevated because of his acute confusional status.  Nursing Notes Reviewed/ Care Coordinated Applicable Imaging Reviewed Interpretation of Laboratory Data incorporated into ED treatment   Plan: as per TTS in conjunction with oncoming provider team   New Prescriptions New Prescriptions   No medications on file     Mancel BaleElliott Allyson Tineo, MD 01/06/17 1007

## 2017-01-04 NOTE — ED Triage Notes (Signed)
[facility]. Pt is obviously paranoid and speaking about 'servers and main frames' as if he thinks people are listening to him from his TV. Denies SI/HI. States he is irritable. Alert.

## 2017-01-05 DIAGNOSIS — Z9114 Patient's other noncompliance with medication regimen: Secondary | ICD-10-CM | POA: Diagnosis not present

## 2017-01-05 DIAGNOSIS — E7800 Pure hypercholesterolemia, unspecified: Secondary | ICD-10-CM | POA: Diagnosis not present

## 2017-01-05 DIAGNOSIS — Z79899 Other long term (current) drug therapy: Secondary | ICD-10-CM | POA: Diagnosis not present

## 2017-01-05 DIAGNOSIS — F17210 Nicotine dependence, cigarettes, uncomplicated: Secondary | ICD-10-CM | POA: Diagnosis not present

## 2017-01-05 DIAGNOSIS — F29 Unspecified psychosis not due to a substance or known physiological condition: Secondary | ICD-10-CM | POA: Diagnosis not present

## 2017-01-05 DIAGNOSIS — F250 Schizoaffective disorder, bipolar type: Secondary | ICD-10-CM | POA: Diagnosis not present

## 2017-01-05 DIAGNOSIS — I10 Essential (primary) hypertension: Secondary | ICD-10-CM | POA: Diagnosis not present

## 2017-01-05 DIAGNOSIS — F209 Schizophrenia, unspecified: Secondary | ICD-10-CM | POA: Diagnosis not present

## 2017-01-05 NOTE — ED Notes (Signed)
Patient has been given food and juice.

## 2017-01-05 NOTE — [facility] Counselor (Signed)
Clinician expressed to elaine, RN that [facility] and the disposition was inpatient treatment.    Gwinda Passereylese D Bennett, MS, [facility], [facility] Triage Specialist [phone]

## 2017-01-05 NOTE — BH Assessment (Addendum)
[facility] Assessment Progress Note  Per Thedore MinsMojeed Akintayo, MD, this pt requires psychiatric hospitalization at this time.  At 13:56, [facility] to their facility by Dr Forrestine HimButtar to Melvia HeapsEmerson C.  Jamison Lord, DNP concurs with this decision, as does EDP Derwood KaplanAnkit Nanavati, MD, who has also initiated IVC for the pt.  [place]C documents [place] [facility] [place], and at [employer]16:26 Magistrate Brown confirms receipt.  Pt's nurse has been notified, and agrees to call report to [phone].  [facility] [place] [facility] [place] [facility].  Doylene Canninghomas Effrey Davidow, MA Triage Specialist [phone]

## 2017-01-05 NOTE — ED Notes (Signed)
Left message with Officer Pascel for the [employer] [facility].

## 2017-01-05 NOTE — ED Notes (Signed)
[place] Officer here to serve [employer] papers. Also, [place] called to report patient will be transported.

## 2017-01-05 NOTE — ED Notes (Signed)
[facility] [facility] [place] [facility] [place] to [place] [facility].

## 2017-01-05 NOTE — ED Notes (Signed)
Updated Old Lucas Knapp about patients condition. Patient ambulated with deputies out of facility. Belongings given to deputy.

## 2017-01-05 NOTE — ED Notes (Signed)
Gave report to Murrell ConverseKay Gixon, RN [facility]. [phone]. Awaiting for IVC papers to be served and to follow up with transportation to psych facility.

## 2017-01-28 DIAGNOSIS — F250 Schizoaffective disorder, bipolar type: Secondary | ICD-10-CM | POA: Diagnosis not present

## 2017-02-19 DIAGNOSIS — F250 Schizoaffective disorder, bipolar type: Secondary | ICD-10-CM | POA: Diagnosis not present

## 2017-03-19 DIAGNOSIS — F250 Schizoaffective disorder, bipolar type: Secondary | ICD-10-CM | POA: Diagnosis not present

## 2017-03-31 DIAGNOSIS — F250 Schizoaffective disorder, bipolar type: Secondary | ICD-10-CM | POA: Diagnosis not present

## 2017-04-16 DIAGNOSIS — F250 Schizoaffective disorder, bipolar type: Secondary | ICD-10-CM | POA: Diagnosis not present

## 2017-05-21 DIAGNOSIS — F250 Schizoaffective disorder, bipolar type: Secondary | ICD-10-CM | POA: Diagnosis not present

## 2017-06-09 DIAGNOSIS — F250 Schizoaffective disorder, bipolar type: Secondary | ICD-10-CM | POA: Diagnosis not present

## 2017-09-16 DIAGNOSIS — F250 Schizoaffective disorder, bipolar type: Secondary | ICD-10-CM | POA: Diagnosis not present

## 2018-01-13 DIAGNOSIS — F250 Schizoaffective disorder, bipolar type: Secondary | ICD-10-CM | POA: Diagnosis not present

## 2018-03-29 ENCOUNTER — Encounter: Payer: Self-pay | Admitting: Family

## 2018-03-29 ENCOUNTER — Ambulatory Visit (INDEPENDENT_AMBULATORY_CARE_PROVIDER_SITE_OTHER): Payer: Medicare HMO | Admitting: Family

## 2018-03-29 VITALS — BP 128/86 | HR 94 | Temp 98.4°F | Resp 16 | Ht 64.0 in | Wt 225.2 lb

## 2018-03-29 DIAGNOSIS — F3170 Bipolar disorder, currently in remission, most recent episode unspecified: Secondary | ICD-10-CM | POA: Diagnosis not present

## 2018-03-29 DIAGNOSIS — R42 Dizziness and giddiness: Secondary | ICD-10-CM | POA: Diagnosis not present

## 2018-03-29 DIAGNOSIS — E785 Hyperlipidemia, unspecified: Secondary | ICD-10-CM

## 2018-03-29 DIAGNOSIS — I10 Essential (primary) hypertension: Secondary | ICD-10-CM | POA: Diagnosis not present

## 2018-03-29 DIAGNOSIS — M6740 Ganglion, unspecified site: Secondary | ICD-10-CM | POA: Diagnosis not present

## 2018-03-29 LAB — LDL CHOLESTEROL, DIRECT: Direct LDL: 183 mg/dL

## 2018-03-29 LAB — LIPID PANEL
CHOL/HDL RATIO: 7
Cholesterol: 249 mg/dL — ABNORMAL HIGH (ref 0–200)
HDL: 34.7 mg/dL — ABNORMAL LOW (ref >39.00)
NonHDL: 214.47
Triglycerides: 239 mg/dL — ABNORMAL HIGH (ref 0.0–149.0)
VLDL: 47.8 mg/dL — AB (ref 0.0–40.0)

## 2018-03-29 NOTE — Progress Notes (Signed)
Subjective:    Patient ID: Lucas Knapp, male    DOB: 1986/09/28, 32 y.o.   MRN: [id number]  HPI  [name] is a 32 yr old male who presents today for follow up.   Reports that he gets dizzy sometimes after bending down quickly and then standing.  Has been present several days a week intermittently for last several months.    Depression- he is maintained on haldo and is seeing psychiatry at the [facility]   Dallas Breeding NP for psychiatry. Reports stable mood.  HTN-  BP Readings from Last 3 Encounters:  03/29/18 128/86  01/05/17 144/86  09/29/16 (!) 141/90   Hyperlipidemia- Not on mevacor.   Lab Results  Component Value Date   CHOL 226 (H) 09/29/2016   HDL 40.40 09/29/2016   LDLCALC 150 (H) 09/29/2016   TRIG 178.0 (H) 09/29/2016   CHOLHDL 6 09/29/2016    I have not seen him in >2 years.  Since that time he had an ED visit with acute psychosis.   Review of Systems See HPI  Past Medical History:  Diagnosis Date  . Back pain   . Bipolar 1 disorder (HCC)   . Hyperlipidemia   . Hypertension    pt has been prescribed HCTZ 12.5 mg daily. Pt was 140/80 on admission.   . Schizophrenia [facility])      Social History   Socioeconomic History  . Marital status: Single    Spouse name: Not on file  . Number of children: Not on file  . Years of education: Not on file  . Highest education level: Not on file  Occupational History  . Not on file  Social Needs  . Financial resource strain: Not on file  . Food insecurity:    Worry: Not on file    Inability: Not on file  . Transportation needs:    Medical: Not on file    Non-medical: Not on file  Tobacco Use  . Smoking status: Current Every Day Smoker    Types: Cigarettes  . Smokeless tobacco: Never Used  . Tobacco comment: 10-15  Substance and Sexual Activity  . Alcohol use: Yes    Alcohol/week: 0.0 oz    Comment: occasionally --"I need to drink more"  . Drug use: Yes    Types: Marijuana    Comment: Pt  endorsed used of marijuana  . Sexual activity: Never  Lifestyle  . Physical activity:    Days per week: Not on file    Minutes per session: Not on file  . Stress: Not on file  Relationships  . Social connections:    Talks on phone: Not on file    Gets together: Not on file    Attends religious service: Not on file    Active member of club or organization: Not on file    Attends meetings of clubs or organizations: Not on file    Relationship status: Not on file  . Intimate partner violence:    Fear of current or ex partner: Not on file    Emotionally abused: Not on file    Physically abused: Not on file    Forced sexual activity: Not on file  Other Topics Concern  . 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    Single   No children   Completed college Clinical research associate in Statistician)  Enjoys swimming, playing pool, walking   [place]  Has identical twin and 2 other brothers in [place]    No past surgical history on file.  Family History  Problem Relation Age of Onset  . Hypertension Father   . Hyperlipidemia Father   . Hypertension Paternal Grandmother   . Hypertension Paternal Grandfather   . Hypertension Brother        identical twin  . Psychosis Brother        "mental issues"      Allergies  Allergen Reactions  . Geodon [Ziprasidone Hcl] Other (See Comments)    Reaction:  Made pts throat dry     Current Outpatient Medications on File Prior to Visit  Medication Sig Dispense Refill  . Asenapine Maleate (SAPHRIS) 10 MG SUBL Place 10 mg under the tongue 2 (two) times daily.    . cyclobenzaprine (FLEXERIL) 10 MG tablet Take 1 tablet (10 mg total) by mouth 2 (two) times daily as needed for muscle spasms. 20 tablet 0  . haloperidol (HALDOL) 5 MG tablet     . hydrochlorothiazide (MICROZIDE) 12.5 MG capsule Take 12.5 mg by mouth daily.    Marland Kitchen. ibuprofen  (ADVIL,MOTRIN) 800 MG tablet Take 800 mg by mouth 3 (three) times daily as needed for mild pain or moderate pain.    Marland Kitchen. lovastatin (MEVACOR) 20 MG tablet Take 1 tablet (20 mg total) by mouth at bedtime. 90 tablet 1   No current facility-administered medications on file prior to visit.     BP 128/86 (BP Location: Right Arm, Patient Position: Sitting, Cuff Size: Large)   Pulse 94   Temp 98.4 F (36.9 C) (Oral)   Resp 16   Ht 5\' 4"  (1.626 m)   Wt 225 lb 3.2 oz (102.2 kg)   SpO2 98%   BMI 38.66 kg/m       Objective:   Physical Exam  Constitutional: He is oriented to person, place, and time. He appears well-developed and well-nourished. No distress.  HENT:  Head: Normocephalic and atraumatic.  Right Ear: Tympanic membrane normal.  Left Ear: Tympanic membrane and ear canal normal.  Mouth/Throat: No oropharyngeal exudate, posterior oropharyngeal edema or posterior oropharyngeal erythema.  Cardiovascular: Normal rate and regular rhythm.  No murmur heard. Pulmonary/Chest: Effort normal and breath sounds normal. No respiratory distress. He has no wheezes. He has no rales.  Musculoskeletal: He exhibits no edema.  Ganglion- ventral surface of left wrist.    Lymphadenopathy:    He has no cervical adenopathy.  Neurological: He is alert and oriented to person, place, and time.  Skin: Skin is warm and dry.  Psychiatric: He has a normal mood and affect. His behavior is normal. Thought content normal.          Assessment & Plan:  Dizziness- will add claritin once daily for next few weeks to see if this helps.  Hypertension- blood pressure looks good off and on.    Hyperlipidemia- not on statin, check follow up lipid panel.    Ganglion cyst- refer to hand surgeon for further evaluation.   Bipolar disorder- stable on haldol- management per psychiatry.

## 2018-03-29 NOTE — Patient Instructions (Signed)
Please complete lab work piror to leaving. You should be contacted about referral to the hand surgeon for the cyst on your wrist. Add claritin 10mg  once daily to see if this helps with your dizziness. Please stand slowly. Let me know if your symptoms worsen or if they fail to improve.

## 2018-03-30 ENCOUNTER — Telehealth: Payer: Self-pay | Admitting: Family

## 2018-03-30 MED ORDER — LOVASTATIN 20 MG PO TABS
20.0000 mg | ORAL_TABLET | Freq: Every day | ORAL | 1 refills | Status: DC
Start: 2018-03-30 — End: 2018-08-06

## 2018-03-30 NOTE — Telephone Encounter (Signed)
Cholesterol is elevated. Please ask pt to restart mevacor. Repeat flp in 3 months.

## 2018-03-31 NOTE — Telephone Encounter (Signed)
Left message for pt to return my call.

## 2018-04-01 NOTE — Telephone Encounter (Signed)
Pt. Given lab results and instructions. Verbalizes understanding. Appointment made for return labs. 

## 2018-04-08 DIAGNOSIS — F250 Schizoaffective disorder, bipolar type: Secondary | ICD-10-CM | POA: Diagnosis not present

## 2018-04-12 DIAGNOSIS — M67432 Ganglion, left wrist: Secondary | ICD-10-CM | POA: Diagnosis not present

## 2018-05-10 DIAGNOSIS — M67432 Ganglion, left wrist: Secondary | ICD-10-CM | POA: Diagnosis not present

## 2018-06-08 DIAGNOSIS — M67432 Ganglion, left wrist: Secondary | ICD-10-CM | POA: Diagnosis not present

## 2018-06-09 DIAGNOSIS — F250 Schizoaffective disorder, bipolar type: Secondary | ICD-10-CM | POA: Diagnosis not present

## 2018-06-23 DIAGNOSIS — F250 Schizoaffective disorder, bipolar type: Secondary | ICD-10-CM | POA: Diagnosis not present

## 2018-06-28 ENCOUNTER — Emergency Department (HOSPITAL_BASED_OUTPATIENT_CLINIC_OR_DEPARTMENT_OTHER)
Admission: EM | Admit: 2018-06-28 | Discharge: 2018-06-29 | Disposition: A | Discharge status: Other Acute Inpatient Hospital | Payer: Medicare HMO | Attending: Emergency Medicine | Admitting: Emergency Medicine

## 2018-06-28 ENCOUNTER — Encounter (HOSPITAL_BASED_OUTPATIENT_CLINIC_OR_DEPARTMENT_OTHER): Payer: Self-pay

## 2018-06-28 ENCOUNTER — Other Ambulatory Visit: Payer: Self-pay

## 2018-06-28 ENCOUNTER — Encounter: Payer: Self-pay | Admitting: Family

## 2018-06-28 ENCOUNTER — Ambulatory Visit (INDEPENDENT_AMBULATORY_CARE_PROVIDER_SITE_OTHER): Payer: Medicare HMO | Admitting: Family

## 2018-06-28 VITALS — BP 133/96 | HR 111 | Temp 98.4°F | Resp 18 | Ht 64.5 in | Wt 218.8 lb

## 2018-06-28 DIAGNOSIS — F17210 Nicotine dependence, cigarettes, uncomplicated: Secondary | ICD-10-CM | POA: Diagnosis not present

## 2018-06-28 DIAGNOSIS — I10 Essential (primary) hypertension: Secondary | ICD-10-CM | POA: Insufficient documentation

## 2018-06-28 DIAGNOSIS — R1310 Dysphagia, unspecified: Secondary | ICD-10-CM | POA: Diagnosis not present

## 2018-06-28 DIAGNOSIS — F209 Schizophrenia, unspecified: Secondary | ICD-10-CM | POA: Diagnosis not present

## 2018-06-28 DIAGNOSIS — M545 Low back pain: Secondary | ICD-10-CM | POA: Diagnosis not present

## 2018-06-28 DIAGNOSIS — M549 Dorsalgia, unspecified: Secondary | ICD-10-CM | POA: Diagnosis present

## 2018-06-28 DIAGNOSIS — F29 Unspecified psychosis not due to a substance or known physiological condition: Secondary | ICD-10-CM | POA: Diagnosis not present

## 2018-06-28 DIAGNOSIS — R21 Rash and other nonspecific skin eruption: Secondary | ICD-10-CM

## 2018-06-28 DIAGNOSIS — Z79899 Other long term (current) drug therapy: Secondary | ICD-10-CM | POA: Diagnosis not present

## 2018-06-28 DIAGNOSIS — G8929 Other chronic pain: Secondary | ICD-10-CM

## 2018-06-28 DIAGNOSIS — R451 Restlessness and agitation: Secondary | ICD-10-CM | POA: Diagnosis not present

## 2018-06-28 LAB — CBC WITH DIFFERENTIAL/PLATELET
BASOS ABS: 0 10*3/uL (ref 0.0–0.1)
Basophils Relative: 0 %
Eosinophils Absolute: 0 10*3/uL (ref 0.0–0.7)
Eosinophils Relative: 0 %
HEMATOCRIT: 47 % (ref 39.0–52.0)
Hemoglobin: 17.4 g/dL — ABNORMAL HIGH (ref 13.0–17.0)
LYMPHS PCT: 44 %
Lymphs Abs: 4.1 10*3/uL — ABNORMAL HIGH (ref 0.7–4.0)
MCH: 31.6 pg (ref 26.0–34.0)
MCHC: 37 g/dL — ABNORMAL HIGH (ref 30.0–36.0)
MCV: 85.5 fL (ref 78.0–100.0)
Monocytes Absolute: 0.9 10*3/uL (ref 0.1–1.0)
Monocytes Relative: 10 %
NEUTROS ABS: 4.2 10*3/uL (ref 1.7–7.7)
Neutrophils Relative %: 46 %
PLATELETS: 314 10*3/uL (ref 150–400)
RBC: 5.5 MIL/uL (ref 4.22–5.81)
RDW: 12.2 % (ref 11.5–15.5)
WBC: 9.2 10*3/uL (ref 4.0–10.5)

## 2018-06-28 LAB — COMPREHENSIVE METABOLIC PANEL
ALBUMIN: 4.8 g/dL (ref 3.5–5.0)
ALK PHOS: 62 U/L (ref 38–126)
ALT: 76 U/L — ABNORMAL HIGH (ref 0–44)
ANION GAP: 12 (ref 5–15)
AST: 113 U/L — AB (ref 15–41)
BILIRUBIN TOTAL: 1.8 mg/dL — AB (ref 0.3–1.2)
BUN: 14 mg/dL (ref 6–20)
CALCIUM: 9.4 mg/dL (ref 8.9–10.3)
CO2: 22 mmol/L (ref 22–32)
CREATININE: 1.04 mg/dL (ref 0.61–1.24)
Chloride: 104 mmol/L (ref 98–111)
GFR calc Af Amer: >60 mL/min (ref >60)
GFR calc non Af Amer: >60 mL/min (ref >60)
GLUCOSE: 101 mg/dL — AB (ref 70–99)
Potassium: 3.7 mmol/L (ref 3.5–5.1)
Sodium: 138 mmol/L (ref 135–145)
TOTAL PROTEIN: 8.1 g/dL (ref 6.5–8.1)

## 2018-06-28 LAB — ETHANOL: Alcohol, Ethyl (B): <10 mg/dL (ref <10)

## 2018-06-28 LAB — RAPID URINE DRUG SCREEN, HOSP PERFORMED
Amphetamines: NOT DETECTED
Benzodiazepines: NOT DETECTED
Cocaine: NOT DETECTED
Opiates: NOT DETECTED
Tetrahydrocannabinol: DETECTED — AB

## 2018-06-28 MED ORDER — HALOPERIDOL 5 MG PO TABS
ORAL_TABLET | ORAL | Status: AC
  Administered 2018-06-28: 5 mg via ORAL
  Filled 2018-06-28: qty 1

## 2018-06-28 MED ORDER — HYDROXYZINE HCL 25 MG PO TABS
25.0000 mg | ORAL_TABLET | Freq: Once | ORAL | Status: AC
  Administered 2018-06-28: 25 mg via ORAL
  Filled 2018-06-28: qty 1

## 2018-06-28 MED ORDER — HALOPERIDOL 2 MG PO TABS
2.0000 mg | ORAL_TABLET | Freq: Once | ORAL | Status: DC
  Administered 2018-06-28: 5 mg via ORAL
  Filled 2018-06-28: qty 1

## 2018-06-28 MED ORDER — MELOXICAM 7.5 MG PO TABS: 7.5000 mg | ORAL_TABLET | Freq: Every day | ORAL | 0 refills | Status: DC

## 2018-06-28 MED ORDER — ZIPRASIDONE MESYLATE 20 MG IM SOLR
20.0000 mg | Freq: Once | INTRAMUSCULAR | Status: AC
  Administered 2018-06-28: 20 mg via INTRAMUSCULAR

## 2018-06-28 MED ORDER — MELOXICAM 7.5 MG PO TABS
7.5000 mg | ORAL_TABLET | Freq: Every day | ORAL | Status: DC
  Administered 2018-06-28: 7.5 mg via ORAL
  Filled 2018-06-28: qty 1

## 2018-06-28 MED ORDER — PANTOPRAZOLE SODIUM 40 MG PO TBEC
40.0000 mg | DELAYED_RELEASE_TABLET | Freq: Every day | ORAL | Status: DC
  Administered 2018-06-28: 40 mg via ORAL
  Filled 2018-06-28: qty 1

## 2018-06-28 MED ORDER — LORAZEPAM 1 MG PO TABS
1.0000 mg | ORAL_TABLET | Freq: Once | ORAL | Status: AC
  Administered 2018-06-28: 1 mg via ORAL
  Filled 2018-06-28: qty 1

## 2018-06-28 MED ORDER — OMEPRAZOLE 40 MG PO CPDR: 40.0000 mg | DELAYED_RELEASE_CAPSULE | Freq: Every day | ORAL | 3 refills | Status: DC

## 2018-06-28 MED ORDER — ZIPRASIDONE MESYLATE 20 MG IM SOLR
INTRAMUSCULAR | Status: AC
  Administered 2018-06-28: 20 mg via INTRAMUSCULAR
  Filled 2018-06-28: qty 20

## 2018-06-28 MED ORDER — BETAMETHASONE DIPROPIONATE 0.05 % EX CREA: TOPICAL_CREAM | Freq: Two times a day (BID) | CUTANEOUS | 0 refills | Status: DC

## 2018-06-28 MED ORDER — PRAVASTATIN SODIUM 20 MG PO TABS
20.0000 mg | ORAL_TABLET | Freq: Every day | ORAL | Status: DC
  Filled 2018-06-28: qty 1

## 2018-06-28 NOTE — Progress Notes (Signed)
Pt accepted to [facility], main campus.  Dr. Loyola Mastornwall is the accepting/attending provider. Call report to [phone]. [email or web address]. [facility] at [phone]. Pt is involuntary and will need to be transported by law enforcement.  [facility] at 10am on 06/29/18.   Wells GuilesSarah Zoya Sprecher, LCSW, LCAS Disposition CSW [facility] [phone]

## 2018-06-28 NOTE — ED Notes (Signed)
Pt removed ED issued scrubs and walking around room naked. Pt immediately given new pair of scrubs.

## 2018-06-28 NOTE — ED Notes (Signed)
Pt resting.

## 2018-06-28 NOTE — ED Notes (Signed)
Pt given ginger ale and chips for snack

## 2018-06-28 NOTE — Progress Notes (Signed)
Pt meets inpatient criteria per Fransisca KaufmannLaura Davis, NP. Referral information has been sent to the following hospitals for review: [facility]  [facility]  [facility]  [facility]  [facility]  [facility]  CSW will continue to assist with placement needs.   Wells GuilesSarah Breyton Vanscyoc, LCSW, LCAS Disposition CSW [facility]/TT[facility] [phone]
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
## 2018-06-28 NOTE — ED Notes (Signed)
RN rounded on pt. Asked pt how he was feeling. Pt responded "I don't know, just feels like something snatched my soul." When asked to clarify, pt just answers "I don't know". He is laying in bed, calm and cooperative.

## 2018-06-28 NOTE — ED Notes (Addendum)
PT sts still unable to provide urine sample

## 2018-06-28 NOTE — Progress Notes (Signed)
Subjective:    Patient ID: Lucas Knapp, male    DOB: 11/09/1986, 32 y.o.   MRN: [id number]  HPI  Patient is a 32 yr old male who presents today with several complaints:  1) dysphagia- Reports that he has intermittent dysphagia.    2) Skin rash- noted on chest.  3) Low back pain- reports low back pain since he was in [place].  Reports tightening in his lower legs with sudden sharp pains. Has been falling as a result. Reports that he denies bowel/bladder incontinence.   4) Depression-  Works at the the [employer] in [place].  He is maintained on Haldol tablets.  Reports that he stopped the haldol due to side effects of dizziness which interfered with his job. Stopped due to dizziness.  Reports that he denies thoughts about hurting himself or others.   Review of Systems See HPI  Past Medical History:  Diagnosis Date  . Back pain   . Bipolar 1 disorder (HCC)   . Hyperlipidemia   . Hypertension    pt has been prescribed HCTZ 12.5 mg daily. Pt was 140/80 on admission.   . Schizophrenia [facility](HCC)      Social History   Socioeconomic History  . Marital status: Single    Spouse name: Not on file  . Number of children: Not on file  . Years of education: Not on file  . Highest education level: Not on file  Occupational History  . Not on file  Social Needs  . Financial resource strain: Not on file  . Food insecurity:    Worry: Not on file    Inability: Not on file  . Transportation needs:    Medical: Not on file    Non-medical: Not on file  Tobacco Use  . Smoking status: Current Every Day Smoker    Types: Cigarettes  . Smokeless tobacco: Never Used  . Tobacco comment: 10-15  Substance and Sexual Activity  . Alcohol use: Yes    Alcohol/week: 0.0 oz    Comment: occasionally --"I need to drink more"  . Drug use: Yes    Types: Marijuana    Comment: Pt endorsed used of marijuana  . Sexual activity: Never  Lifestyle  . Physical activity:    Days per week: Not on  file    Minutes per session: Not on file  . Stress: Not on file  Relationships  . Social connections:    Talks on phone: Not on file    Gets together: Not on file    Attends religious service: Not on file    Active member of club or organization: Not on file    Attends meetings of clubs or organizations: Not on file    Relationship status: Not on file  . Intimate partner violence:    Fear of current or ex partner: Not on file    Emotionally abused: Not on file    Physically abused: Not on file    Forced sexual activity: Not on file  Other Topics Concern  . 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 Also works for the [employer] one day a week.    Single   No children   Completed college (bachelors in Statisticianlectronic technology)   Enjoys swimming, playing pool, walking   [place]  Has identical twin and 2 other brothers in [place]    No past surgical history  on file.  Family History  Problem Relation Age of Onset  . Hypertension Father   . Hyperlipidemia Father   . Hypertension Paternal Grandmother   . Hypertension Paternal Grandfather   . Hypertension Brother        identical twin  . Psychosis Brother        "mental issues"      Allergies  Allergen Reactions  . Geodon [Ziprasidone Hcl] Other (See Comments)    Reaction:  Made pts throat dry     Current Outpatient Medications on File Prior to Visit  Medication Sig Dispense Refill  . haloperidol (HALDOL) 5 MG tablet     . lovastatin (MEVACOR) 20 MG tablet Take 1 tablet (20 mg total) by mouth at bedtime. 90 tablet 1   No current facility-administered medications on file prior to visit.     BP (!) 133/96 (BP Location: Left Arm, Cuff Size: Large)   Pulse (!) 111   Temp 98.4 F (36.9 C) (Oral)   Resp 18   Ht 5' 4.5" (1.638 m)   Wt 218 lb 12.8 oz (99.2 kg)   SpO2 99%   BMI 36.98 kg/m        Objective:   Physical Exam  Constitutional: He is oriented to  person, place, and time. He appears well-developed and well-nourished. No distress.  HENT:  Head: Normocephalic and atraumatic.  Cardiovascular: Normal rate and regular rhythm.  No murmur heard. Pulmonary/Chest: Effort normal and breath sounds normal. No respiratory distress. He has no wheezes. He has no rales.  Musculoskeletal: He exhibits no edema.       Lumbar back: He exhibits tenderness.  Neurological: He is alert and oriented to person, place, and time.  Reflex Scores:      Patellar reflexes are 3+ on the right side and 3+ on the left side. 4-5+ bilateral LE strength  Skin: Skin is warm and dry.  Round circular area of hyperpigmentation on right anteror chest  Psychiatric: His affect is angry. His speech is rapid and/or pressured. He is agitated. Thought content is paranoid and delusional. He expresses no suicidal plans and no homicidal plans.          Assessment & Plan:  Psychosis- pt took himself off of haldol. Reports that he has been hearing voices or "thoughts".  Thinks that "all the medicines are building up in me and causing my back pain."  Reports that he gets very angry with his twin brother and "we yell at each other all the time."  "I think the electrons and protons in my back are working against each other."  Very aggitated in the room, distracted. Denies thoughts of hurting self or others when I ask him.  He reports significant depression symptoms, "I have all these things" when completing PHQ-9.  He is will to proceed to the ED for further evaluation. I gave report to Dr. Rubin Payor ER physician at [facility] ED.  Low back pain- + falls, mild LE weakness noted on exam. Rx with meloxicam and referral placed for MRI of the lumbar spine. Discussed need to go to ER in future if he develops bowel/bladder incontinence and he verbalizes understanding.  Skin rash- rx with trial of betamethasone.  Dysphagia- trial of PPI, refer to GI.

## 2018-06-28 NOTE — ED Notes (Signed)
Lucas LaymanLindsey with BH, states  Pt needs inpatient bed , no beds available at this time, [facility] ED unable to take pt as hold ,  if pt tries to leave he needs to be " petitioned  " and held, Dr Rubin PayorPickering made aware

## 2018-06-28 NOTE — ED Notes (Signed)
Pt pacing in his room with both hands in the air, head back, eyes rolled back, swaying side to side. When asked what he was doing he states " I'm soaking up the vibrations in here, don't you feel that? The energy in this room?"

## 2018-06-28 NOTE — ED Notes (Signed)
Ginger Ale provided to pt 
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
## 2018-06-28 NOTE — ED Notes (Signed)
Pt becoming increasingly agitated and pacing around the room. MD made aware, orders received and initiated. Security on standby.

## 2018-06-28 NOTE — Patient Instructions (Signed)
Please begin omeprazole once daily for reflux. You should be contacted about scheduling your MRI of your back and about your referral to GI for further evaluation of his swallowing.  Begin betamethasone for rash- let me know if your symptoms worsen or fail to improve. Please proceed to the ER on the first floor.

## 2018-06-28 NOTE — ED Notes (Signed)
Pt more calm now; no longer pacing; lying on the bed, slightly restless

## 2018-06-28 NOTE — ED Triage Notes (Signed)
Pt c/o chronic lower back pain-states he was sent from PCP in the building for back pain and rash to chest-pt with slow gait

## 2018-06-28 NOTE — ED Provider Notes (Signed)
11:02 PM Patient agitated, stripping himself naked and running around uncontrollably.  Geodon 20 mg IM ordered.  4:56 AM Patient was able to sleep for 6 hours after the Geodon.  He is now awake with pacing, rambling speech and retching.  Ativan 2 mg IM ordered.  7:02 AM Resting comfortably at this time.  Awaiting transport to an inpatient facility.   Meyer Arora, Jonny RuizJohn, MD 06/29/18 [phone]

## 2018-06-29 DIAGNOSIS — F200 Paranoid schizophrenia: Secondary | ICD-10-CM | POA: Diagnosis not present

## 2018-06-29 DIAGNOSIS — F17210 Nicotine dependence, cigarettes, uncomplicated: Secondary | ICD-10-CM | POA: Diagnosis not present

## 2018-06-29 DIAGNOSIS — E669 Obesity, unspecified: Secondary | ICD-10-CM | POA: Diagnosis not present

## 2018-06-29 DIAGNOSIS — K649 Unspecified hemorrhoids: Secondary | ICD-10-CM | POA: Diagnosis not present

## 2018-06-29 DIAGNOSIS — K219 Gastro-esophageal reflux disease without esophagitis: Secondary | ICD-10-CM | POA: Diagnosis not present

## 2018-06-29 DIAGNOSIS — Z9114 Patient's other noncompliance with medication regimen: Secondary | ICD-10-CM | POA: Diagnosis not present

## 2018-06-29 DIAGNOSIS — E785 Hyperlipidemia, unspecified: Secondary | ICD-10-CM | POA: Diagnosis not present

## 2018-06-29 DIAGNOSIS — I10 Essential (primary) hypertension: Secondary | ICD-10-CM | POA: Diagnosis not present

## 2018-06-29 DIAGNOSIS — Z79899 Other long term (current) drug therapy: Secondary | ICD-10-CM | POA: Diagnosis not present

## 2018-06-29 DIAGNOSIS — M545 Low back pain: Secondary | ICD-10-CM | POA: Diagnosis not present

## 2018-06-29 DIAGNOSIS — F209 Schizophrenia, unspecified: Secondary | ICD-10-CM | POA: Diagnosis not present

## 2018-06-29 MED ORDER — LORAZEPAM 2 MG/ML IJ SOLN
2.0000 mg | Freq: Once | INTRAMUSCULAR | Status: AC
  Administered 2018-06-29: 2 mg via INTRAMUSCULAR
  Filled 2018-06-29: qty 1

## 2018-06-29 NOTE — ED Notes (Signed)
Pt laying in bed, still talking to himself but less moving around and speech less pressured.

## 2018-06-29 NOTE — ED Notes (Signed)
[facility]

## 2018-06-29 NOTE — ED Notes (Signed)
Pt awake, heaving loudly. EDP notified.

## 2018-06-29 NOTE — ED Notes (Signed)
Pt laying in bed, talking to himself with pressured speech.

## 2018-06-29 NOTE — ED Notes (Signed)
Pt awake, pacing room, talking to himself.

## 2018-07-01 ENCOUNTER — Telehealth: Payer: Self-pay

## 2018-07-01 ENCOUNTER — Other Ambulatory Visit: Payer: Medicare HMO

## 2018-07-01 NOTE — Telephone Encounter (Signed)
Not at this time. He is currently admitted at an outside behavioral health facility as far as I know.

## 2018-07-01 NOTE — Telephone Encounter (Signed)
Melissa just wanted to see if you wanted to schedule this patient for ED follow up. He had lab appointment scheduled for today but I don't see any BH appointments scheduled. Please advise

## 2018-07-19 ENCOUNTER — Telehealth: Payer: Self-pay | Admitting: Family

## 2018-07-19 NOTE — Telephone Encounter (Signed)
Copied from CRM [id number]. Topic: Quick Communication - See Telephone Encounter >> Jul 19, 2018 10:02 AM Valentina LucksMatos, Jackelin wrote: CRM for notification. See Telephone encounter for: 07/19/18.    Pt came in office stating is needing an EKG done, there is no orders on file, please advise pt if order is being put in or does the pt need an appt to see provider before having EKG? Please advise. (pt was informed possible needing an appt with provider- pt did not want to wait to make an appt)

## 2018-07-19 NOTE — Telephone Encounter (Signed)
Attempted to reach pt and left detailed message that if he is having chest pain/ shortness of breath that he should be evaluated in the ER. Otherwise [facility] and to please call back and schedule that appointment. [facility] with pt.

## 2018-07-19 NOTE — Telephone Encounter (Signed)
Noted and agree. 

## 2018-07-27 ENCOUNTER — Encounter (HOSPITAL_COMMUNITY): Payer: Self-pay | Admitting: Emergency Medicine

## 2018-07-27 ENCOUNTER — Inpatient Hospital Stay (HOSPITAL_COMMUNITY)
Admission: AD | Admit: 2018-07-27 | Discharge: 2018-08-06 | DRG: 885 | Disposition: A | Discharge status: Home / Self Care | Payer: Medicare HMO | Source: Intra-hospital | Attending: Psychiatry | Admitting: Psychiatry

## 2018-07-27 ENCOUNTER — Emergency Department (HOSPITAL_COMMUNITY)
Admission: EM | Admit: 2018-07-27 | Discharge: 2018-07-27 | Disposition: A | Discharge status: Other Acute Inpatient Hospital | Payer: Medicare HMO | Attending: Emergency Medicine | Admitting: Emergency Medicine

## 2018-07-27 ENCOUNTER — Other Ambulatory Visit: Payer: Self-pay

## 2018-07-27 ENCOUNTER — Encounter (HOSPITAL_COMMUNITY): Payer: Self-pay

## 2018-07-27 DIAGNOSIS — E785 Hyperlipidemia, unspecified: Secondary | ICD-10-CM | POA: Diagnosis present

## 2018-07-27 DIAGNOSIS — F200 Paranoid schizophrenia: Principal | ICD-10-CM | POA: Diagnosis present

## 2018-07-27 DIAGNOSIS — I10 Essential (primary) hypertension: Secondary | ICD-10-CM | POA: Insufficient documentation

## 2018-07-27 DIAGNOSIS — F99 Mental disorder, not otherwise specified: Secondary | ICD-10-CM | POA: Diagnosis present

## 2018-07-27 DIAGNOSIS — F17210 Nicotine dependence, cigarettes, uncomplicated: Secondary | ICD-10-CM | POA: Diagnosis present

## 2018-07-27 DIAGNOSIS — Z79899 Other long term (current) drug therapy: Secondary | ICD-10-CM | POA: Diagnosis not present

## 2018-07-27 DIAGNOSIS — F209 Schizophrenia, unspecified: Secondary | ICD-10-CM | POA: Diagnosis not present

## 2018-07-27 DIAGNOSIS — K219 Gastro-esophageal reflux disease without esophagitis: Secondary | ICD-10-CM | POA: Diagnosis not present

## 2018-07-27 DIAGNOSIS — F319 Bipolar disorder, unspecified: Secondary | ICD-10-CM | POA: Insufficient documentation

## 2018-07-27 DIAGNOSIS — F1210 Cannabis abuse, uncomplicated: Secondary | ICD-10-CM | POA: Insufficient documentation

## 2018-07-27 DIAGNOSIS — F419 Anxiety disorder, unspecified: Secondary | ICD-10-CM | POA: Diagnosis not present

## 2018-07-27 DIAGNOSIS — F250 Schizoaffective disorder, bipolar type: Secondary | ICD-10-CM | POA: Diagnosis not present

## 2018-07-27 DIAGNOSIS — G4700 Insomnia, unspecified: Secondary | ICD-10-CM | POA: Diagnosis not present

## 2018-07-27 LAB — COMPREHENSIVE METABOLIC PANEL
ALBUMIN: 4.3 g/dL (ref 3.5–5.0)
ALT: 43 U/L (ref 0–44)
AST: 49 U/L — AB (ref 15–41)
Alkaline Phosphatase: 60 U/L (ref 38–126)
Anion gap: 8 (ref 5–15)
CO2: 26 mmol/L (ref 22–32)
CREATININE: 0.96 mg/dL (ref 0.61–1.24)
Calcium: 9.4 mg/dL (ref 8.9–10.3)
Chloride: 106 mmol/L (ref 98–111)
GFR calc Af Amer: >60 mL/min (ref >60)
GLUCOSE: 90 mg/dL (ref 70–99)
Potassium: 3.8 mmol/L (ref 3.5–5.1)
Sodium: 140 mmol/L (ref 135–145)
Total Bilirubin: 0.8 mg/dL (ref 0.3–1.2)
Total Protein: 7 g/dL (ref 6.5–8.1)

## 2018-07-27 LAB — CBC
HEMATOCRIT: 45 % (ref 39.0–52.0)
HEMOGLOBIN: 15.1 g/dL (ref 13.0–17.0)
MCH: 31 pg (ref 26.0–34.0)
MCHC: 33.6 g/dL (ref 30.0–36.0)
MCV: 92.4 fL (ref 78.0–100.0)
Platelets: 419 10*3/uL — ABNORMAL HIGH (ref 150–400)
RBC: 4.87 MIL/uL (ref 4.22–5.81)
RDW: 12.7 % (ref 11.5–15.5)
WBC: 5.9 10*3/uL (ref 4.0–10.5)

## 2018-07-27 LAB — RAPID URINE DRUG SCREEN, HOSP PERFORMED
AMPHETAMINES: NOT DETECTED
BARBITURATES: NOT DETECTED
BENZODIAZEPINES: NOT DETECTED
Cocaine: NOT DETECTED
Opiates: NOT DETECTED
TETRAHYDROCANNABINOL: POSITIVE — AB

## 2018-07-27 LAB — ACETAMINOPHEN LEVEL: Acetaminophen (Tylenol), Serum: <10 ug/mL — ABNORMAL LOW (ref 10–30)

## 2018-07-27 LAB — SALICYLATE LEVEL: Salicylate Lvl: <7 mg/dL (ref 2.8–30.0)

## 2018-07-27 LAB — ETHANOL

## 2018-07-27 MED ORDER — ACETAMINOPHEN 325 MG PO TABS
650.0000 mg | ORAL_TABLET | Freq: Four times a day (QID) | ORAL | Status: DC | PRN
  Administered 2018-07-27 – 2018-07-31 (×2): 650 mg via ORAL
  Filled 2018-07-27: qty 8
  Filled 2018-07-27 (×3): qty 2

## 2018-07-27 MED ORDER — HALOPERIDOL 5 MG PO TABS
5.0000 mg | ORAL_TABLET | Freq: Two times a day (BID) | ORAL | Status: DC
  Administered 2018-07-27 – 2018-08-06 (×20): 5 mg via ORAL
  Filled 2018-07-27 (×26): qty 1

## 2018-07-27 MED ORDER — PANTOPRAZOLE SODIUM 40 MG PO TBEC
80.0000 mg | DELAYED_RELEASE_TABLET | Freq: Every day | ORAL | Status: DC
  Administered 2018-07-28 – 2018-08-06 (×10): 80 mg via ORAL
  Filled 2018-07-27 (×12): qty 2

## 2018-07-27 MED ORDER — OLANZAPINE 10 MG IM SOLR
10.0000 mg | Freq: Once | INTRAMUSCULAR | Status: DC | PRN
Start: 2018-07-27 — End: 2018-07-27

## 2018-07-27 MED ORDER — PRAVASTATIN SODIUM 40 MG PO TABS
20.0000 mg | ORAL_TABLET | Freq: Every day | ORAL | Status: DC
  Administered 2018-07-27: 20 mg via ORAL
  Filled 2018-07-27: qty 1

## 2018-07-27 MED ORDER — BENZTROPINE MESYLATE 0.5 MG PO TABS
0.5000 mg | ORAL_TABLET | Freq: Every day | ORAL | Status: DC
  Administered 2018-07-28 – 2018-08-06 (×10): 0.5 mg via ORAL
  Filled 2018-07-27 (×12): qty 1

## 2018-07-27 MED ORDER — OLANZAPINE 10 MG IM SOLR: 10.0000 mg | Freq: Once | INTRAMUSCULAR | Status: DC | PRN

## 2018-07-27 MED ORDER — PANTOPRAZOLE SODIUM 40 MG PO TBEC
80.0000 mg | DELAYED_RELEASE_TABLET | Freq: Every day | ORAL | Status: DC
  Administered 2018-07-27: 80 mg via ORAL
  Filled 2018-07-27: qty 2

## 2018-07-27 MED ORDER — HALOPERIDOL 1 MG PO TABS: 5.0000 mg | ORAL_TABLET | Freq: Two times a day (BID) | ORAL | Status: DC

## 2018-07-27 MED ORDER — ALUM & MAG HYDROXIDE-SIMETH 200-200-20 MG/5ML PO SUSP: 30.0000 mL | ORAL | Status: DC | PRN

## 2018-07-27 MED ORDER — DIPHENHYDRAMINE HCL 50 MG/ML IJ SOLN: 50.0000 mg | Freq: Once | INTRAMUSCULAR | Status: DC | PRN

## 2018-07-27 MED ORDER — BENZTROPINE MESYLATE 1 MG PO TABS
0.5000 mg | ORAL_TABLET | Freq: Every day | ORAL | Status: DC
  Administered 2018-07-27: 0.5 mg via ORAL
  Filled 2018-07-27: qty 1

## 2018-07-27 MED ORDER — MAGNESIUM HYDROXIDE 400 MG/5ML PO SUSP: 30.0000 mL | Freq: Every day | ORAL | Status: DC | PRN

## 2018-07-27 MED ORDER — PRAVASTATIN SODIUM 20 MG PO TABS
20.0000 mg | ORAL_TABLET | Freq: Every day | ORAL | Status: DC
  Administered 2018-07-28 – 2018-08-05 (×9): 20 mg via ORAL
  Filled 2018-07-27 (×12): qty 1

## 2018-07-27 MED ORDER — HYDROXYZINE HCL 25 MG PO TABS
25.0000 mg | ORAL_TABLET | Freq: Three times a day (TID) | ORAL | Status: DC | PRN
  Administered 2018-07-27 – 2018-08-04 (×7): 25 mg via ORAL
  Filled 2018-07-27 (×4): qty 1

## 2018-07-27 MED ORDER — HYDRALAZINE HCL 10 MG PO TABS
10.0000 mg | ORAL_TABLET | Freq: Once | ORAL | Status: AC
  Administered 2018-07-27: 10 mg via ORAL
  Filled 2018-07-27 (×2): qty 1

## 2018-07-27 NOTE — ED Notes (Signed)
[facility]; Copy placed in medical records and original in red folder in POD F med room-Monique,RN

## 2018-07-27 NOTE — Tx Team (Signed)
Initial Treatment Plan 07/27/2018 11:45 PM Lucas Siaerrance Marks [id number]    PATIENT STRESSORS:  Educational concerns Financial difficulties Health problems   PATIENT STRENGTHS: Motivation for treatment/growth   PATIENT IDENTIFIED PROBLEMS: "help with blood pressure/migraines"                     DISCHARGE CRITERIA:  Ability to meet basic life and health needs Adequate post-discharge living arrangements Improved stabilization in mood, thinking, and/or behavior  PRELIMINARY DISCHARGE PLAN: Placement in alternative living arrangements  PATIENT/FAMILY INVOLVEMENT: This treatment plan has been presented to and reviewed with the patient, Lucas Knapp.  The patient and family have been given the opportunity to ask questions and make suggestions.  Jonetta SpeakAshton E Debany Vantol, RN 07/27/2018, 11:45 PM

## 2018-07-27 NOTE — BH Assessment (Addendum)
Assessment Note  Lucas Knapp is a single 32 y.o. male who presents voluntarily to [facility] with unusual behavior, flight of ideas, & hallucinations. Pt has a history of a schizophrenia dx. Pt states he is here at hospital for an "imaging or scan check". Pt is hyperverbal with loud, pressured speech. He is focused on numbers & conspiracies (frequently the DEA, religion, CEO's, the coast guard & evil). He often begins an answer with a logical response & devolves into rambling & nonsense words. Pt gives multiple reasons for not taking medications- the pill is a tracking device, a cause of choking/throat pain, & they hurt his spirit. As pt continued to speak on meds, content devolved. "I know the periodic table & can call the chemistry dept & she can let the lab rats out.Marland Kitchen or the dinosaurs".  Pt reports one pill he finds helpful. It is a long pill, pink & orange. Pt reports increasing difficulty with sleep.  Pt denies suicidal ideation, current or past. He denies feeling depressed. Pt denies homicidal ideation/ history of violence. Pt agreed to AVH. Visually- he sees dots. Auditory- a horn- pt reports he always hears the horn, then he stated sometimes. Pt states current stressors includes his twin brother who he lives with. Pt expressed current  frustration with his brother telling him what to do & what not to do. Pt reports his mother is a support for him. Pt has limited insight & fair judgment. Pt's memory is intact. Pt denies legal problems. ? Pt's OP history includes tx at [facility]. IP history is unclear. Pt reports he has had inpt tx. He was agitated & loud responding to this question, & no clear answer was given. Pt denied alcohol & substance abuse. He reported his brother did not like him drinking alcohol.  ? MSE: Pt is casually dressed, alert, oriented x3 with pressured, rapid, loud, speech and restless motor behavior. Eye contact is good. Pt's mood is pleasant, apprehensive, suspicious, labile. Affect is  irritable, labile, constricted & apprehensive. Affect is congruent with mood. Thought process is relevant & irrelevant, with flight of ideas. Pt gets increasingly agitated the more he talks about something but is fairly easy to redirect with a new topic.  Disposition: Nanine Means, NP recommends psychiatric hospitalization   Diagnosis: F20.0 Schizophrenia, paranoid type  Past Medical History:  Past Medical History:  Diagnosis Date  . Back pain   . Bipolar 1 disorder (HCC)   . Hyperlipidemia   . Hypertension    pt has been prescribed HCTZ 12.5 mg daily. Pt was 140/80 on admission.   . Schizophrenia (HCC)     History reviewed. No pertinent surgical history.  Family History:  Family History  Problem Relation Age of Onset  . Hypertension Father   . Hyperlipidemia Father   . Hypertension Paternal Grandmother   . Hypertension Paternal Grandfather   . Hypertension Brother        identical twin  . Psychosis Brother        "mental issues"      Social History:  reports that he has been smoking cigarettes.  He has never used smokeless tobacco. He reports that he drinks alcohol. He reports that he has current or past drug history. Drug: Marijuana.  Additional Social History:  Alcohol / Drug Use Pain Medications: Please see MAR Prescriptions: Please see MAR Over the Counter: Please see MAR History of alcohol / drug use?: No history of alcohol / drug abuse Longest period of sobriety (when/how long): Please  see MAR  CIWA: CIWA-Ar BP: (!) 155/102 Pulse Rate: 82 COWS:    Allergies:  Allergies  Allergen Reactions  . Geodon [Ziprasidone Hcl] Other (See Comments)    Reaction:  Made pts throat dry     Home Medications:  (Not in a hospital admission)  OB/GYN Status:  No LMP for male patient.  General Assessment Data Assessment unable to be completed: Yes Reason for not completing assessment: (Cart 1 not answering. Marcelino Duster to contact IT) Location of Assessment: [facility] ED TTS  Assessment: In system Is this a Tele or Face-to-Face Assessment?: Tele Assessment Is this an Initial Assessment or a Re-assessment for this encounter?: Initial Assessment Marital status: Single Living Arrangements: Other relatives(twin brother in apt) Can pt return to current living arrangement?: Yes Admission Status: Voluntary Referral Source: Self/Family/Friend Insurance type: [employer] Technology [employer]     Crisis Care Plan Living Arrangements: Other relatives(twin brother in apt) Name of Psychiatrist: none currently("I need one") Name of Therapist: none currently  Education Status Is patient currently in school?: No Is the patient employed, unemployed or receiving disability?: (unclear due to pt psychosis)  Risk to self with the past 6 months Suicidal Ideation: No Has patient been a risk to self within the past 6 months prior to admission? : No Suicidal Intent: No Has patient had any suicidal intent within the past 6 months prior to admission? : No Is patient at risk for suicide?: No Suicidal Plan?: No Has patient had any suicidal plan within the past 6 months prior to admission? : No Access to Means: No What has been your use of drugs/alcohol within the last 12 months?: denies; brother doesn't like pt to drink etoh Previous Attempts/Gestures: No How many times?: 0 Depression: No Substance abuse history and/or treatment for substance abuse?: No Suicide prevention information given to non-admitted patients: Not applicable  Risk to Others within the past 6 months Homicidal Ideation: No Does patient have any lifetime risk of violence toward others beyond the six months prior to admission? : No Thoughts of Harm to Others: No Current Homicidal Intent: No Current Homicidal Plan: No Access to Homicidal Means: No Identified Victim: none notified History of harm to others?: No Assessment of Violence: In past 6-12 months Violent Behavior Description: (none noted) Does patient have  access to weapons?: No Criminal Charges Pending?: No Does patient have a court date: No Is patient on probation?: No  Psychosis Hallucinations: Auditory, Visual(horn in my ear "some of the time all of the time") Delusions: Grandiose, Persecutory  Mental Status Report Appearance/Hygiene: In scrubs Eye Contact: Good Motor Activity: Restlessness, Agitation Speech: Pressured, Rapid, Logical/coherent, Incoherent, Argumentative, Rhyming, Word salad, Loud Level of Consciousness: Alert, Restless Mood: Labile, Pleasant, Suspicious, Apprehensive Affect: Irritable, Labile, Constricted, Apprehensive Anxiety Level: Minimal Thought Processes: Relevant, Irrelevant, Circumstantial, Flight of Ideas Judgement: Partial Orientation: Person, Place, Situation Obsessive Compulsive Thoughts/Behaviors: None  Cognitive Functioning Concentration: Decreased Memory: Recent Intact, Remote Intact Is patient IDD: No Is patient DD?: No Insight: Poor Impulse Control: Fair Appetite: Good Have you had any weight changes? : No Change Sleep: Decreased Total Hours of Sleep: (UTA; pt c c/o decreased sleep) Vegetative Symptoms: None  ADLScreening [facility] Assessment Services) Patient's cognitive ability adequate to safely complete daily activities?: Yes Patient able to express need for assistance with ADLs?: Yes Independently performs ADLs?: Yes (appropriate for developmental age)  Prior Inpatient Therapy Prior Inpatient Therapy: Yes(per pt) Prior Therapy Dates: UTA Prior Therapy Facilty/Provider(s): UTA Reason for Treatment: schizophrenia  Prior Outpatient Therapy Prior Outpatient Therapy:  Yes Prior Therapy Dates: ongoing Prior Therapy Facilty/Provider(s): [name] Reason for Treatment: Schizophrenia Does patient have an ACCT team?: No Does patient have Intensive In-House Services?  : No Does patient have [name] services? : No Does patient have P4CC services?: No  ADL Screening (condition at time of  admission) Patient's cognitive ability adequate to safely complete daily activities?: Yes Is the patient deaf or have difficulty hearing?: No Does the patient have difficulty seeing, even when wearing glasses/contacts?: No Does the patient have difficulty concentrating, remembering, or making decisions?: Yes Patient able to express need for assistance with ADLs?: Yes Does the patient have difficulty dressing or bathing?: No Independently performs ADLs?: Yes (appropriate for developmental age) Does the patient have difficulty walking or climbing stairs?: No Weakness of Legs: Both Weakness of Arms/Hands: None  Home Assistive Devices/Equipment Home Assistive Devices/Equipment: None  Therapy Consults (therapy consults require a physician order) PT Evaluation Needed: No OT Evalulation Needed: No SLP Evaluation Needed: No       Advance Directives (For Healthcare) Does Patient Have a Medical Advance Directive?: No Would patient like information on creating a medical advance directive?: No - Patient declined    Additional Information 1:1 In Past 12 Months?: No CIRT Risk: Yes Elopement Risk: Yes Does patient have medical clearance?: Yes     Disposition:  psychiatric inpatient per Nanine MeansJamison Lord, NP Disposition Initial Assessment Completed for this Encounter: Yes Disposition of Patient: (pending NP conultation)  On Site Evaluation by:   Reviewed with Physician:    Clearnce Sorreleirdre H Selita Staiger 07/27/2018 3:02 PM

## 2018-07-27 NOTE — Progress Notes (Signed)
[facility] per Nanine MeansJamison Lord, PMHNP.    Nanine MeansJamison Lord, PMHNP

## 2018-07-27 NOTE — Progress Notes (Signed)
[facility] Nanine MeansJamison Lord, NP is the accepting provider.  Dr. Altamese Carolinaainville is the attending provider.   Call report to [phone]   [facility] notified.   Pt is currently being petitioned for involuntary commitment. Completed [facility]. He will need to be transported by [employer] enforcement.  [facility] once [facility]C paperwork is complete and transportation is arranged.   Wells GuilesSarah Cambrea Kirt, LCSW, LCAS Disposition CSW [facility] [phone]

## 2018-07-27 NOTE — ED Triage Notes (Signed)
Pt arrives today to ED appears to be having flight of ideas and hallucinations. Pt reports no pain of issues. Pt is continually talking about his twin brother and witch craft not making any sense. Pt speaking about famous people.

## 2018-07-27 NOTE — ED Notes (Signed)
TTS speaking with patient. 

## 2018-07-27 NOTE — ED Notes (Signed)
All belongings removed from patient and placed in Locker 6 to be inventoried by primary RN once placed in a room. Pt wanded by security.  Pt in paper scrubs

## 2018-07-27 NOTE — Progress Notes (Signed)
Patient presents with tangential/flight of ideas/disorganized affect and behavior during admission interview and assessment. Pt refused to sign paperwork. VS monitored and recorded. Provider alerted about hypertension. Skin check performed with [facility] and revealed skin, clean, dry, and intact. Contraband was not found. Patient was oriented to unit and schedule. Pt states "I refuse to sign any more papers. I was not about to let Jesus take me. The wrath of God is upon them". Pt denies SI/HI/AVH at this time. PO fluids provided. Safety maintained. Rest encouraged.

## 2018-07-27 NOTE — ED Provider Notes (Signed)
[facility] EMERGENCY DEPARTMENT Provider Note   CSN: [id number] Arrival date & time: 07/27/18  9562     History   Chief Complaint Chief Complaint  Patient presents with  . Mental Health Problem    HPI Lucas Knapp is a 32 y.o. male.  Patient with hx schizophrenia, noted with unusual behavior, flight of ideas, hallucinations. Pt limited historian with limited insight into symptoms - level 5 caveat, psychiatric illness. Pt indicates is taking his normal meds. Denies fever. No pain. No headache. No chest pain. No sob. Denies SI/HI.   The history is provided by the patient. The history is limited by the condition of the patient.  Mental Health Problem  Associated symptoms: anxiety   Associated symptoms: no abdominal pain, no appetite change, no chest pain and no headaches     Past Medical History:  Diagnosis Date  . Back pain   . Bipolar 1 disorder (HCC)   . Hyperlipidemia   . Hypertension    pt has been prescribed HCTZ 12.5 mg daily. Pt was 140/80 on admission.   . Schizophrenia [facility])     Patient Active Problem List   Diagnosis Date Noted  . Preventative health care 09/29/2016  . HTN (hypertension) 02/18/2016  . Hyperlipidemia 02/18/2016  . Fatigue 02/18/2016  . Low back pain 02/18/2016  . Abdominal pain 11/27/2014  . Schizophrenia, paranoid type (HCC) 09/17/2012    Class: Acute  . Cannabis abuse 09/17/2012    History reviewed. No pertinent surgical history.      Home Medications    Prior to Admission medications   Medication Sig Start Date End Date Taking? Authorizing Provider  betamethasone dipropionate (DIPROLENE) 0.05 % cream Apply topically 2 (two) times daily. 06/28/18   Sandford Craze, NP  haloperidol (HALDOL) 5 MG tablet  01/13/18   [provider]  lovastatin (MEVACOR) 20 MG tablet Take 1 tablet (20 mg total) by mouth at bedtime. 03/30/18   Sandford Craze, NP  meloxicam (MOBIC) 7.5 MG tablet Take 1 tablet (7.5 mg  total) by mouth daily. 06/28/18   Sandford Craze, NP  omeprazole (PRILOSEC) 40 MG capsule Take 1 capsule (40 mg total) by mouth daily. 06/28/18   Sandford Craze, NP    Family History Family History  Problem Relation Age of Onset  . Hypertension Father   . Hyperlipidemia Father   . Hypertension Paternal Grandmother   . Hypertension Paternal Grandfather   . Hypertension Brother        identical twin  . Psychosis Brother        "mental issues"      Social History Social History   Tobacco Use  . Smoking status: Current Every Day Smoker    Types: Cigarettes  . Smokeless tobacco: Never Used  . Tobacco comment: 10-15  Substance Use Topics  . Alcohol use: Yes    Alcohol/week: 0.0 oz    Comment: occasionally --"I need to drink more"  . Drug use: Yes    Types: Marijuana    Comment: Pt endorsed used of marijuana     Allergies   Geodon [ziprasidone hcl]   Review of Systems Review of Systems  Constitutional: Negative for appetite change and fever.  HENT: Negative for sore throat.   Eyes: Negative for redness.  Respiratory: Negative for shortness of breath.   Cardiovascular: Negative for chest pain.  Gastrointestinal: Negative for abdominal pain.  Genitourinary: Negative for flank pain.  Musculoskeletal: Negative for back pain and neck pain.  Skin: Negative for  rash.  Neurological: Negative for headaches.  Hematological: Does not bruise/bleed easily.  Psychiatric/Behavioral: The patient is nervous/anxious.      Physical Exam Updated Vital Signs BP (!) 155/102 (BP Location: Left Arm)   Pulse 82   Temp 98.3 F (36.8 C) (Oral)   Resp 16   SpO2 98%   Physical Exam  Constitutional: He appears well-developed and well-nourished.  HENT:  Head: Atraumatic.  Mouth/Throat: Oropharynx is clear and moist.  Eyes: Conjunctivae are normal.  Neck: Neck supple. No tracheal deviation present.  Cardiovascular: Normal rate, regular rhythm, normal heart sounds and intact  distal pulses. Exam reveals no gallop and no friction rub.  No murmur heard. Pulmonary/Chest: Effort normal and breath sounds normal. No accessory muscle usage. No respiratory distress.  Abdominal: Soft. Bowel sounds are normal. He exhibits no distension. There is no tenderness.  Musculoskeletal: He exhibits no edema.  Neurological: He is alert.  Motor intact bil, stre 5/5. Steady gait. Speech fluent.   Skin: Skin is warm and dry.  Psychiatric:  Patient anxious, rapidly moves from one unrelated thought process to next. No SI.   Nursing note and vitals reviewed.    ED Treatments / Results  Labs (all labs ordered are listed, but only abnormal results are displayed) Results for orders placed or performed during the hospital encounter of 07/27/18  Comprehensive metabolic panel  Result Value Ref Range   Sodium 140 135 - 145 mmol/L   Potassium 3.8 3.5 - 5.1 mmol/L   Chloride 106 98 - 111 mmol/L   CO2 26 22 - 32 mmol/L   Glucose, Bld 90 70 - 99 mg/dL   BUN <5 (L) 6 - 20 mg/dL   Creatinine, Ser 1.61 0.61 - 1.24 mg/dL   Calcium 9.4 8.9 - 09.6 mg/dL   Total Protein 7.0 6.5 - 8.1 g/dL   Albumin 4.3 3.5 - 5.0 g/dL   AST 49 (H) 15 - 41 U/L   ALT 43 0 - 44 U/L   Alkaline Phosphatase 60 38 - 126 U/L   Total Bilirubin 0.8 0.3 - 1.2 mg/dL   GFR calc non Af Amer >60 >60 mL/min   GFR calc Af Amer >60 >60 mL/min   Anion gap 8 5 - 15  Ethanol  Result Value Ref Range   Alcohol, Ethyl (B) <10 <10 mg/dL  Salicylate level  Result Value Ref Range   Salicylate Lvl <7.0 2.8 - 30.0 mg/dL  Acetaminophen level  Result Value Ref Range   Acetaminophen (Tylenol), Serum <10 (L) 10 - 30 ug/mL  cbc  Result Value Ref Range   WBC 5.9 4.0 - 10.5 K/uL   RBC 4.87 4.22 - 5.81 MIL/uL   Hemoglobin 15.1 13.0 - 17.0 g/dL   HCT 04.5 40.9 - 81.1 %   MCV 92.4 78.0 - 100.0 fL   MCH 31.0 26.0 - 34.0 pg   MCHC 33.6 30.0 - 36.0 g/dL   RDW 91.4 78.2 - 95.6 %   Platelets 419 (H) 150 - 400 K/uL  Rapid urine drug  screen (hospital performed)  Result Value Ref Range   Opiates NONE DETECTED NONE DETECTED   Cocaine NONE DETECTED NONE DETECTED   Benzodiazepines NONE DETECTED NONE DETECTED   Amphetamines NONE DETECTED NONE DETECTED   Tetrahydrocannabinol POSITIVE (A) NONE DETECTED   Barbiturates NONE DETECTED NONE DETECTED    EKG None  Radiology No results found.  Procedures Procedures (including critical care time)  Medications Ordered in ED Medications - No data to display  Initial Impression / Assessment and Plan / ED Course  I have reviewed the triage vital signs and the nursing notes.  Pertinent labs & imaging results that were available during my care of the patient were reviewed by me and considered in my medical decision making (see chart for details).  Labs.  Reviewed nursing notes and prior charts for additional history.   BH team consulted.   Recheck pt, calm and alert, drinking po fluids. Awaiting bh team.  [facility]     Final Clinical Impressions(s) / ED Diagnoses   Final diagnoses:  None    ED Discharge Orders    None       Cathren LaineSteinl, Nyeisha Goodall, MD 07/28/18 [phone]

## 2018-07-28 DIAGNOSIS — F17210 Nicotine dependence, cigarettes, uncomplicated: Secondary | ICD-10-CM

## 2018-07-28 DIAGNOSIS — I10 Essential (primary) hypertension: Secondary | ICD-10-CM

## 2018-07-28 DIAGNOSIS — E785 Hyperlipidemia, unspecified: Secondary | ICD-10-CM

## 2018-07-28 DIAGNOSIS — F200 Paranoid schizophrenia: Principal | ICD-10-CM

## 2018-07-28 DIAGNOSIS — K219 Gastro-esophageal reflux disease without esophagitis: Secondary | ICD-10-CM

## 2018-07-28 DIAGNOSIS — F419 Anxiety disorder, unspecified: Secondary | ICD-10-CM

## 2018-07-28 MED ORDER — HYDROCHLOROTHIAZIDE 12.5 MG PO CAPS
12.5000 mg | ORAL_CAPSULE | Freq: Every day | ORAL | Status: DC
  Administered 2018-07-28 – 2018-08-06 (×10): 12.5 mg via ORAL
  Filled 2018-07-28 (×12): qty 1

## 2018-07-28 MED ORDER — NICOTINE 21 MG/24HR TD PT24
21.0000 mg | MEDICATED_PATCH | Freq: Every day | TRANSDERMAL | Status: DC | PRN
  Administered 2018-07-28 – 2018-08-05 (×6): 21 mg via TRANSDERMAL
  Filled 2018-07-28 (×4): qty 1

## 2018-07-28 MED ORDER — CHLORPROMAZINE HCL 50 MG PO TABS
50.0000 mg | ORAL_TABLET | Freq: Once | ORAL | Status: AC
  Administered 2018-07-28: 50 mg via ORAL
  Filled 2018-07-28: qty 2
  Filled 2018-07-28: qty 1

## 2018-07-28 MED ORDER — TRAZODONE HCL 100 MG PO TABS
100.0000 mg | ORAL_TABLET | Freq: Every evening | ORAL | Status: DC | PRN
  Administered 2018-07-28 – 2018-07-31 (×4): 100 mg via ORAL
  Filled 2018-07-28 (×22): qty 1

## 2018-07-28 NOTE — [facility] Suicide Risk Assessment (Signed)
[facility] INPATIENT:  Family/Significant Other Suicide Prevention Education  Suicide Prevention Education:  Education Completed; No one has been identified by the patient as the family member/significant other with whom the patient will be residing, and identified as the person(s) who will aid the patient in the event of a mental health crisis (suicidal ideations/suicide attempt).  With written consent from the patient, the family member/significant other has been provided the following suicide prevention education, prior to the and/or following the discharge of the patient.  The suicide prevention education provided includes the following:  Suicide risk factors  Suicide prevention and interventions  National Suicide Hotline telephone number  [facility] telephone number  [facility] Assistance 911  [facility] and/or Residential Mobile Crisis Unit telephone number  Request made of family/significant other to:  Remove weapons (e.g., guns, rifles, knives), all items previously/currently identified as safety concern.    Remove drugs/medications (over-the-counter, prescriptions, illicit drugs), all items previously/currently identified as a safety concern.  The family member/significant other verbalizes understanding of the suicide prevention education information provided.  The family member/significant other agrees to remove the items of safety concern listed above.The patient did not endorse SI at the time of admission, nor did the patient c/o SI during the stay here.  SPE not required. However, I did talk to father, Mr Ananias PilgrimVaughan, [phone] and went over crises plan and treatment team recommendations.  Father stated patient did well when getting an injection regularly, and that he thinks the brothers should probably split up when the lease is up.  Ida RogueRodney B Darran Gabay 07/28/2018, 2:19 PM

## 2018-07-28 NOTE — Progress Notes (Signed)
Recreation Therapy Notes  Date: 8.7.19 Time: 1000 Location: 500 Hall Dayroom  Group Topic: Coping Skills  Goal Area(s) Addresses:  Patient will be able to identify positive coping skills. Patient will be able to identify benefits of coping skills. Patient will identify benefits of using coping skills post d/c.  Behavioral Response: None  Intervention: Worksheet, magazines, scissors, glue sticks, Holiday representativeconstruction paper  Activity: PharmacologistCoping Skills.  Patients were to use the magazines to find pictures that represent coping skills for diversions, social, cognitive, tension releasers and physical.   Education: Coping Skills, Discharge Planning.   Education Outcome: Acknowledges understanding/In group clarification offered/Needs additional education.   Clinical Observations/Feedback:  Pt was unable to focus on activity.  Pt was preoccupied with smoking weed.  Pt left early with doctor and didn't return.     Caroll RancherMarjette Esmond Knapp, LRT/CTRS         Caroll RancherLindsay, Allyana Vogan A 07/28/2018 11:33 AM

## 2018-07-28 NOTE — Tx Team (Signed)
Interdisciplinary Treatment and Diagnostic Plan Update  07/28/2018 Time of Session: 1:40 PM  Lucas Knapp MRN: [id number]  Principal Diagnosis: Schizophrenia, paranoid type ([place])  Secondary Diagnoses: Principal Problem:   Schizophrenia, paranoid type ([place])   Current Medications:  Current Facility-Administered Medications  Medication Dose Route Frequency Provider Last Rate Last Dose  . acetaminophen (TYLENOL) tablet 650 mg  650 mg Oral Q6H PRN Patrecia Pour, NP   650 mg at 07/27/18 2047  . alum & mag hydroxide-simeth (MAALOX/MYLANTA) 200-200-20 MG/5ML suspension 30 mL  30 mL Oral Q4H PRN Patrecia Pour, NP      . benztropine (COGENTIN) tablet 0.5 mg  0.5 mg Oral Daily Patrecia Pour, NP   0.5 mg at 07/28/18 0815  . diphenhydrAMINE (BENADRYL) injection 50 mg  50 mg Intramuscular Once PRN Patrecia Pour, NP      . haloperidol (HALDOL) tablet 5 mg  5 mg Oral BID Patrecia Pour, NP   5 mg at 07/28/18 0815  . hydrOXYzine (ATARAX/VISTARIL) tablet 25 mg  25 mg Oral TID PRN Ethelene Hal, NP   25 mg at 07/28/18 1159  . magnesium hydroxide (MILK OF MAGNESIA) suspension 30 mL  30 mL Oral Daily PRN Patrecia Pour, NP      . OLANZapine [facility]) injection 10 mg  10 mg Intramuscular Once PRN Patrecia Pour, NP      . pantoprazole (PROTONIX) EC tablet 80 mg  80 mg Oral Daily Patrecia Pour, NP   80 mg at 07/28/18 0815  . pravastatin (PRAVACHOL) tablet 20 mg  20 mg Oral q1800 Patrecia Pour, NP        PTA Medications: Medications Prior to Admission  Medication Sig Dispense Refill Last Dose  . betamethasone dipropionate (DIPROLENE) 0.05 % cream Apply topically 2 (two) times daily. 30 g 0 unk  . diphenhydrAMINE (BENADRYL) 25 MG tablet Take 25 mg by mouth 2 (two) times daily as needed for itching or allergies.   unk  . haloperidol (HALDOL) 5 MG tablet    unk  . lovastatin (MEVACOR) 20 MG tablet Take 1 tablet (20 mg total) by mouth at bedtime. 90 tablet 1 unk  . meloxicam (MOBIC)  7.5 MG tablet Take 1 tablet (7.5 mg total) by mouth daily. 14 tablet 0 unk  . omeprazole (PRILOSEC) 40 MG capsule Take 1 capsule (40 mg total) by mouth daily. 30 capsule 3 unk    Patient Stressors: Educational concerns Financial difficulties Health problems  Patient Strengths: Motivation for treatment/growth  Treatment Modalities: Medication Management, Group therapy, Case management,  1 to 1 session with clinician, Psychoeducation, Recreational therapy.   Physician Treatment Plan for Primary Diagnosis: Schizophrenia, paranoid type ([place]) Long Term Goal(s): Improvement in symptoms so as ready for discharge  Short Term Goals:    Medication Management: Evaluate patient's response, side effects, and tolerance of medication regimen.  Therapeutic Interventions: 1 to 1 sessions, Unit Group sessions and Medication administration.  Evaluation of Outcomes: Progressing  Physician Treatment Plan for Secondary Diagnosis: Principal Problem:   Schizophrenia, paranoid type ([place])   Long Term Goal(s): Improvement in symptoms so as ready for discharge  Short Term Goals:    Medication Management: Evaluate patient's response, side effects, and tolerance of medication regimen.  Therapeutic Interventions: 1 to 1 sessions, Unit Group sessions and Medication administration.  Evaluation of Outcomes: Progressing   RN Treatment Plan for Primary Diagnosis: Schizophrenia, paranoid type ([place]) Long Term Goal(s): Knowledge of disease and therapeutic regimen to maintain  health will improve  Short Term Goals: Ability to identify and develop effective coping behaviors will improve and Compliance with prescribed medications will improve  Medication Management: RN will administer medications as ordered by provider, will assess and evaluate patient's response and provide education to patient for prescribed medication. RN will report any adverse and/or side effects to prescribing provider.  Therapeutic  Interventions: 1 on 1 counseling sessions, Psychoeducation, Medication administration, Evaluate responses to treatment, Monitor vital signs and CBGs as ordered, Perform/monitor CIWA, COWS, AIMS and Fall Risk screenings as ordered, Perform wound care treatments as ordered.  Evaluation of Outcomes: Progressing   LCSW Treatment Plan for Primary Diagnosis: Schizophrenia, paranoid type ([place]) Long Term Goal(s): Safe transition to appropriate next level of care at discharge, Engage patient in therapeutic group addressing interpersonal concerns.  Short Term Goals: Engage patient in aftercare planning with referrals and resources  Therapeutic Interventions: Assess for all discharge needs, 1 to 1 time with Social worker, Explore available resources and support systems, Assess for adequacy in community support network, Educate family and significant other(s) on suicide prevention, Complete Psychosocial Assessment, Interpersonal group therapy.  Evaluation of Outcomes: Met  Return home, follow up outpt provider [TBD]   Progress in Treatment: Attending groups: Yes Participating in groups: Yes Taking medication as prescribed: Yes Toleration medication: Yes, no side effects reported at this time Family/Significant other contact made: No Patient understands diagnosis: No Limited insight Discussing patient identified problems/goals with staff: Yes Medical problems stabilized or resolved: Yes Denies suicidal/homicidal ideation: Yes Issues/concerns per patient self-inventory: None Other: N/A  New problem(s) identified: None identified at this time.   New Short Term/Long Term Goal(s): "Relax and work on mindfullness.  And be away from my twin brother.  Sometimes I stay with Mr Claiborne Billings.  He gives me a room to stay sometimes."   Discharge Plan or Barriers:   Reason for Continuation of Hospitalization: Disorganization Medication stabilization   Estimated Length of Stay: 8/12  Attendees: Patient:  Lucas Knapp 07/28/2018  1:40 PM  Physician: Maris Berger, MD 07/28/2018  1:40 PM  Nursing: Sena Hitch, RN 07/28/2018  1:40 PM  RN Care Manager: Lars Pinks, RN 07/28/2018  1:40 PM  Social Worker: Ripley Fraise 07/28/2018  1:40 PM  Recreational Therapist: Winfield Cunas 07/28/2018  1:40 PM  Other: Norberto Sorenson 07/28/2018  1:40 PM  Other:  07/28/2018  1:40 PM    Scribe for Treatment Team:  Roque Lias LCSW 07/28/2018 1:40 PM

## 2018-07-28 NOTE — [facility] Group Notes (Signed)
[facility] Group Notes:  (Nursing/MHT/Case Management/Adjunct)  Date:  07/28/2018  Time:  4:00 pm  Type of Therapy:  Psychoeducational Skills  Participation Level:  Did Not Attend  Participation Quality:    Affect:    Cognitive:    Insight:    Engagement in Group:    Modes of Intervention:    Summary of Progress/Problems:  Earline MayotteKnight, Alayshia Marini Shephard 07/28/2018, 5:40 PM

## 2018-07-28 NOTE — H&P (Signed)
Psychiatric Admission Assessment Adult  Patient Identification: Lucas Knapp MRN:  [id number] Date of Evaluation:  07/28/2018 Chief Complaint:  schizophrenia paronioid type  Principal Diagnosis: Schizophrenia, paranoid type ([place]) Diagnosis:   Patient Active Problem List   Diagnosis Date Noted  . Preventative health care [Z00.00] 09/29/2016  . HTN (hypertension) [I10] 02/18/2016  . Hyperlipidemia [E78.5] 02/18/2016  . Fatigue [R53.83] 02/18/2016  . Low back pain [M54.5] 02/18/2016  . Abdominal pain [R10.9] 11/27/2014  . Schizophrenia, paranoid type (Lucas Knapp) [F20.0] 09/17/2012    Class: Acute  . Cannabis abuse [F12.10] 09/17/2012   History of Present Illness:   Lucas Knapp is a 32 y/o M with history of schizophrenia who was admitted voluntarily from [facility] where he presented with worsening symptoms of disorganization and psychosis including disorganizaed speech, flight of ideas, and AH. He reported being off of medications for an unclear amount of time. He was medically cleared and then transferred to [facility] for additional treatment and stabilization.  Upon initial interview, pt is mildly pressured, tangential and circumstantial at times, and he is generally a poor historian. However, he is cooperative with the interview. In regards to his reasons for admission, pt shares, "I drove myself to the ED to get imaging. I need an EKG and a serotonin and melatonin check." When exploring this topic further, pt reveals that his PCP contacted him to come in for an appointment (possibly including labs), and pt somehow interpreted that he needed to go to the ED. He denies any specific concerns prior to hospitalization. He denies physical complaints. He denies SI/HI/AH/VH. He denies paranoia. When asked history about his mood and psychotic symptoms, but is vague and tangential. When asking about AH, pt shares, "I'm talking through Ma Rings' mind now. He's in the Earth and he speaks through me in  electricity." Pt denies symptoms of depression aside from poor concentration. He has some distractibility, pressured speech, and flight of ideas but he otherwise denies symptoms of mania. He denies symptoms of OCD and PTSD. He reports using "hemp cigarettes" which do not contain psychoactive cannabis, but his UDS was positive for THC. He also smoked 1ppd of cigarettes. He denies other illicit substance use.  Discussed with patient about treatment options. He reports follow up at [facility] and good adherence to his outpatient medications but he is unable to name them. Pt is in agreement for [place] to obtain collateral information from his twin brother with whom he lives. We will continue his current regimen without changes until we are able to verify his outpatient medications.   Associated Signs/Symptoms: Depression Symptoms:  difficulty concentrating, anxiety, (Hypo) Manic Symptoms:  Delusions, Distractibility, Flight of Ideas, Hallucinations, Anxiety Symptoms:  NA Psychotic Symptoms:  Delusions, Hallucinations: Auditory PTSD Symptoms: NA Total Time spent with patient: 1 hour  Past Psychiatric History:  - previous dx of schizophrenia - multiple inpatient stays with last admission in 2013 to [facility] - previous outpatient at [facility] - No pervious suicide attempts  Is the patient at risk to self? Yes.    Has the patient been a risk to self in the past 6 months? Yes.    Has the patient been a risk to self within the distant past? Yes.    Is the patient a risk to others? No.  Has the patient been a risk to others in the past 6 months? No.  Has the patient been a risk to others within the distant past? No.   Prior Inpatient Therapy:   Prior Outpatient Therapy:  Alcohol Screening: 1. How often do you have a drink containing alcohol?: Never 2. How many drinks containing alcohol do you have on a typical day when you are drinking?: 1 or 2 3. How often do you have six or more drinks on one  occasion?: Never AUDIT-C Score: 0 9. Have you or someone else been injured as a result of your drinking?: No 10. Has a relative or friend or a doctor or another health worker been concerned about your drinking or suggested you cut down?: No Alcohol Use Disorder Identification Test Final Score (AUDIT): 0 Intervention/Follow-up: Patient Refused Substance Abuse History in the last 12 months:  Yes.   Consequences of Substance Abuse: Medical Consequences:  worsened psychotic symptoms Previous Psychotropic Medications: Yes  Psychological Evaluations: Yes  Past Medical History:  Past Medical History:  Diagnosis Date  . Back pain   . Bipolar 1 disorder ([place])   . Hyperlipidemia   . Hypertension    pt has been prescribed HCTZ 12.5 mg daily. Pt was 140/80 on admission.   . Schizophrenia ([place])    History reviewed. No pertinent surgical history. Family History:  Family History  Problem Relation Age of Onset  . Hypertension Father   . Hyperlipidemia Father   . Hypertension Paternal Grandmother   . Hypertension Paternal Grandfather   . Hypertension Brother        identical twin  . Psychosis Brother        "mental issues"     Family Psychiatric  History: paternal aunt history of unknown mental illness.  Tobacco Screening: Have you used any form of tobacco in the last 30 days? (Cigarettes, Smokeless Tobacco, Cigars, and/or Pipes): Patient Refused Screening Social History: Pt was born in [place] and he has lived in [place] since 2002. He lives with his twin brother. He reports that he is taking classes at [employer]. He is not currently working. He is not married. He has no children. He has hx of DUI. He denies legal history.  Social History   Substance and Sexual Activity  Alcohol Use Yes  . Alcohol/week: 0.0 oz   Comment: occasionally --"I need to drink more"     Social History   Substance and Sexual Activity  Drug Use Yes  . Types: Marijuana   Comment: Pt endorsed used of  marijuana    Additional Social History:                           Allergies:   Allergies  Allergen Reactions  . Geodon [Ziprasidone Hcl] Other (See Comments)    Reaction:  Made pts throat dry    Lab Results:  Results for orders placed or performed during the hospital encounter of 07/27/18 (from the past 48 hour(s))  Comprehensive metabolic panel     Status: Abnormal   Collection Time: 07/27/18  9:46 AM  Result Value Ref Range   Sodium 140 135 - 145 mmol/L   Potassium 3.8 3.5 - 5.1 mmol/L   Chloride 106 98 - 111 mmol/L   CO2 26 22 - 32 mmol/L   Glucose, Bld 90 70 - 99 mg/dL   BUN <5 (L) 6 - 20 mg/dL   Creatinine, Ser 0.96 0.61 - 1.24 mg/dL   Calcium 9.4 8.9 - 10.3 mg/dL   Total Protein 7.0 6.5 - 8.1 g/dL   Albumin 4.3 3.5 - 5.0 g/dL   AST 49 (H) 15 - 41 U/L   ALT 43 0 -  44 U/L   Alkaline Phosphatase 60 38 - 126 U/L   Total Bilirubin 0.8 0.3 - 1.2 mg/dL   GFR calc non Af Amer >60 >60 mL/min   GFR calc Af Amer >60 >60 mL/min    Comment: (NOTE) The eGFR has been calculated using the CKD EPI equation. This calculation has not been validated in all clinical situations. eGFR's persistently <60 mL/min signify possible Chronic Kidney Disease.    Anion gap 8 5 - 15    Comment: Performed at [facility] [address], [place], [place] [address]  Ethanol     Status: None   Collection Time: 07/27/18  9:46 AM  Result Value Ref Range   Alcohol, Ethyl (B) <10 <10 mg/dL    Comment: (NOTE) Lowest detectable limit for serum alcohol is 10 mg/dL. For medical purposes only. Performed at [facility], [place] [address] [place], [place] [address]   Salicylate level     Status: None   Collection Time: 07/27/18  9:46 AM  Result Value Ref Range   Salicylate Lvl <0.9 2.8 - 30.0 mg/dL    Comment: Performed at [facility] [address] [place], [place] [address]  Acetaminophen level     Status: Abnormal   Collection Time: 07/27/18  9:46 AM  Result Value Ref  Range   Acetaminophen (Tylenol), Serum <10 (L) 10 - 30 ug/mL    Comment: (NOTE) Therapeutic concentrations vary significantly. A range of 10-30 ug/mL  may be an effective concentration for many patients. However, some  are best treated at concentrations outside of this range. Acetaminophen concentrations >150 ug/mL at 4 hours after ingestion  and >50 ug/mL at 12 hours after ingestion are often associated with  toxic reactions. Performed at [facility], [place] [address], [place], [place] [address]   cbc     Status: Abnormal   Collection Time: 07/27/18  9:46 AM  Result Value Ref Range   WBC 5.9 4.0 - 10.5 K/uL   RBC 4.87 4.22 - 5.81 MIL/uL   Hemoglobin 15.1 13.0 - 17.0 g/dL   HCT 45.0 39.0 - 52.0 %   MCV 92.4 78.0 - 100.0 fL   MCH 31.0 26.0 - 34.0 pg   MCHC 33.6 30.0 - 36.0 g/dL   RDW 12.7 11.5 - 15.5 %   Platelets 419 (H) 150 - 400 K/uL    Comment: Performed at [facility], [place] [address], [place], [place] [address]  Rapid urine drug screen (hospital performed)     Status: Abnormal   Collection Time: 07/27/18  9:47 AM  Result Value Ref Range   Opiates NONE DETECTED NONE DETECTED   Cocaine NONE DETECTED NONE DETECTED   Benzodiazepines NONE DETECTED NONE DETECTED   Amphetamines NONE DETECTED NONE DETECTED   Tetrahydrocannabinol POSITIVE (A) NONE DETECTED   Barbiturates NONE DETECTED NONE DETECTED    Comment: (NOTE) DRUG SCREEN FOR MEDICAL PURPOSES ONLY.  IF CONFIRMATION IS NEEDED FOR ANY PURPOSE, NOTIFY LAB WITHIN 5 DAYS. LOWEST DETECTABLE LIMITS FOR URINE DRUG SCREEN Drug Class                     Cutoff (ng/mL) Amphetamine and metabolites    1000 Barbiturate and metabolites    200 Benzodiazepine                 086 Tricyclics and metabolites     300 Opiates and metabolites  300 Cocaine and metabolites        300 THC                            50 Performed at [facility], [place] [address] [place], [place] [address]     Blood Alcohol level:   Lab Results  Component Value Date   ETH <10 07/27/2018   ETH <10 40/07/6760    Metabolic Disorder Labs:  No results found for: HGBA1C, MPG No results found for: PROLACTIN Lab Results  Component Value Date   CHOL 249 (H) 03/29/2018   TRIG 239.0 (H) 03/29/2018   HDL 34.70 (L) 03/29/2018   CHOLHDL 7 03/29/2018   VLDL 47.8 (H) 03/29/2018   LDLCALC 150 (H) 09/29/2016    Current Medications: Current Facility-Administered Medications  Medication Dose Route Frequency Provider Last Rate Last Dose  . acetaminophen (TYLENOL) tablet 650 mg  650 mg Oral Q6H PRN Patrecia Pour, NP   650 mg at 07/27/18 2047  . alum & mag hydroxide-simeth (MAALOX/MYLANTA) 200-200-20 MG/5ML suspension 30 mL  30 mL Oral Q4H PRN Patrecia Pour, NP      . benztropine (COGENTIN) tablet 0.5 mg  0.5 mg Oral Daily Patrecia Pour, NP   0.5 mg at 07/28/18 0815  . diphenhydrAMINE (BENADRYL) injection 50 mg  50 mg Intramuscular Once PRN Patrecia Pour, NP      . haloperidol (HALDOL) tablet 5 mg  5 mg Oral BID Patrecia Pour, NP   5 mg at 07/28/18 0815  . hydrOXYzine (ATARAX/VISTARIL) tablet 25 mg  25 mg Oral TID PRN Ethelene Hal, NP   25 mg at 07/28/18 1159  . magnesium hydroxide (MILK OF MAGNESIA) suspension 30 mL  30 mL Oral Daily PRN Patrecia Pour, NP      . OLANZapine [facility]) injection 10 mg  10 mg Intramuscular Once PRN Patrecia Pour, NP      . pantoprazole (PROTONIX) EC tablet 80 mg  80 mg Oral Daily Patrecia Pour, NP   80 mg at 07/28/18 0815  . pravastatin (PRAVACHOL) tablet 20 mg  20 mg Oral q1800 Patrecia Pour, NP       PTA Medications: Medications Prior to Admission  Medication Sig Dispense Refill Last Dose  . betamethasone dipropionate (DIPROLENE) 0.05 % cream Apply topically 2 (two) times daily. 30 g 0 unk  . diphenhydrAMINE (BENADRYL) 25 MG tablet Take 25 mg by mouth 2 (two) times daily as needed for itching or allergies.   unk  . haloperidol (HALDOL) 5 MG tablet    unk  . lovastatin  (MEVACOR) 20 MG tablet Take 1 tablet (20 mg total) by mouth at bedtime. 90 tablet 1 unk  . meloxicam (MOBIC) 7.5 MG tablet Take 1 tablet (7.5 mg total) by mouth daily. 14 tablet 0 unk  . omeprazole (PRILOSEC) 40 MG capsule Take 1 capsule (40 mg total) by mouth daily. 30 capsule 3 unk    Musculoskeletal: Strength & Muscle Tone: within normal limits Gait & Station: normal Patient leans: N/A  Psychiatric Specialty Exam: Physical Exam  Nursing note and vitals reviewed.   Review of Systems  Constitutional: Negative for chills and fever.  Respiratory: Negative for cough and shortness of breath.   Cardiovascular: Negative for chest pain.  Gastrointestinal: Negative for abdominal pain, heartburn, nausea and vomiting.  Psychiatric/Behavioral: Positive for hallucinations. Negative for depression and suicidal ideas. The patient is not  nervous/anxious and does not have insomnia.     Blood pressure (!) 128/100, pulse 80, temperature 97.8 F (36.6 C), temperature source Oral, resp. rate 20, height _0  (1.626 m), weight 101.3 kg (223 lb 6.4 oz), SpO2 100 %.Body mass index is 38.35 kg/m.  General Appearance: Casual and Fairly Groomed  Eye Contact:  Good  Speech:  Clear and Coherent and Normal Rate  Volume:  Normal  Mood:  Euthymic  Affect:  Appropriate, Congruent and Flat  Thought Process:  Coherent, Disorganized and Descriptions of Associations: Tangential  Orientation:  Full (Time, Place, and Person)  Thought Content:  Hallucinations: Auditory, Ideas of Reference:   Delusions, Tangential and Abstract Reasoning  Suicidal Thoughts:  No  Homicidal Thoughts:  No  Memory:  Immediate;   Fair Recent;   Fair Remote;   Fair  Judgement:  Poor  Insight:  Lacking  Psychomotor Activity:  Normal  Concentration:  Concentration: Fair  Recall:  [employer] of Knowledge:  Fair  Language:  Fair  Akathisia:  No  Handed:    AIMS (if indicated):     Assets:  Resilience Social Support  ADL's:  Intact   Cognition:  WNL  Sleep:  Number of Hours: 6.75    Treatment Plan Summary: Daily contact with patient to assess and evaluate symptoms and progress in treatment and Medication management  Observation Level/Precautions:  15 minute checks  Laboratory:  CBC Chemistry Profile HbAIC UDS UA  Psychotherapy:  Encourage participation in groups and therapeutic milieu   Medications:  Continue haldol 79m po BID. Continue cogentin 0.533mpo qDay. Continue vistaril 2562mo TID prn anxiety. Continue protonix 57m81m qDay. Continue pravastatin 20mg78mqDay. Start HCTZ 12.5mg p71mDay (pt has elevated BP and has hx of HTN, and he is currently taking no BP medications as outpatient, and he has previous history of taking HCTZ during last admission).  Consultations:    Discharge Concerns:    Estimated LOS: 5-7 days  Other:     Physician Treatment Plan for Primary Diagnosis: Schizophrenia, paranoid type (H[facility]) L[place] Term Goal(s): Improvement in symptoms so as ready for discharge  Short Term Goals: Ability to maintain clinical measurements within normal limits will improve  Physician Treatment Plan for Secondary Diagnosis: Principal Problem:   Schizophrenia, paranoid type ([facility])  [place] Term Goal(s): Improvement in symptoms so as ready for discharge  Short Term Goals: Ability to identify and develop effective coping behaviors will improve  I certify that inpatient services furnished can reasonably be expected to improve the patient's condition.    ChristPennelope Bracken/7/20191:28 PM

## 2018-07-28 NOTE — BHH Counselor (Signed)
Adult Comprehensive Assessment  Patient ID: Lucas Knapp, male   DOB: 06/09/1986, 32 y.o.   MRN: [id number]  Information Source: Information source: Patient  Current Stressors:  Patient states their primary concerns and needs for treatment are:: "I needed to get away from my brother." Patient states their goals for this hospitilization and ongoing recovery are:: "Practice mindfullness and relax." Educational / Learning stressors: None Employment / Job issues: Pt works for [employer] agency part time, also on disability Family Relationships: Lucas Knapp reports constant aggravation and fighting with twin brother, with whom he lives Surveyor, quantityinancial / Lack of resources (include bankruptcy): Housing / Lack of housing: Hopes to get his own place so he and brother don't have to deal with each other Physical health (include injuries &life threatening diseases): None  Social relationships: None  Substance abuse: Marijuana use  Bereavement / Loss: None   Living/Environment/Situation:  Living Arrangements: Twin brother Living conditions (as described by patient or guardian):"He is aggravating me to my breaking point." How long has patient lived in current situation?: many years  What is atmosphere in current home: Chaotic  Family History:  Marital status: Single Does patient have children?: No Sexual Orientation: Straight  Childhood History:  By whom was/is the patient raised?: Both parents Additional childhood history information: Pt reported that his childhood was "sheltered" Description of patient's relationship with caregiver when they were a child: Pt reported that the relationship was good  Patient's description of current relationship with people who raised him/her: Pt reported that he is still close with his parents  Does patient have siblings?: Yes Number of Siblings: 2  Description of patient's current relationship with siblings:Better with older brother, bad with twin, with whom he  lives Did patient suffer any verbal/emotional/physical/sexual abuse as a child?: No Did patient suffer from severe childhood neglect?: No Has patient ever been sexually abused/assaulted/raped as an adolescent or adult?: No Was the patient ever a victim of a crime or a disaster?: No Witnessed domestic violence?: No Has patient been effected by domestic violence as an adult?: No  Education:  Highest grade of school patient has completed: Chief Operating OfficerBachelors' Degree  Currently a Consulting civil engineerstudent?: No Learning disability?: No  Employment/Work Situation:  Employment situation: Employed Where is patient currently employed?: Works through [employer]  How long has patient been employed?: Since 2008 Patient's job has been impacted by current illness: No What is the longest time patient has a held a job?: 2 years  Where was the patient employed at that time?: [employer]  Has patient ever been in the Eli Lilly and Companymilitary?: No Has patient ever served in combat?: No  Financial Resources:  Financial resources: Income from employment;Receives SSDI;Food stamps Does patient have a representative payee or guardian?: No  Alcohol/Substance Abuse:  What has been your use of drugs/alcohol within the last 12 months?: Marijuana  If attempted suicide, did drugs/alcohol play a role in this?: No Alcohol/Substance Abuse Treatment Hx: Past Tx, Outpatient If yes, describe treatment: [facility]  Has alcohol/substance abuse ever caused legal problems?: Yes  Social Support System: Patient's Community Support System: Fair Development worker, communityDescribe Community Support System: Friends  Type of faith/religion: Pt reported "ComptrollerQadosh" How does patient's faith help to cope with current illness?: Pt reported that he is researching religion  Leisure/Recreation:  Leisure and Hobbies: Swimming, playing pool, reading  Strengths/Needs:   What is the patient's perception of their strengths?: "I'm a good  worker" Patient states they can use these personal strengths during their treatment to  contribute to their recovery: Unable to identify Patient states these barriers may affect/interfere with their treatment: None Patient states these barriers may affect their return to the community: My brother Other important information patient would like considered in planning for their treatment: None  Discharge Plan:   Currently receiving community mental health services: Yes (From Whom)(Unknown) Patient states they will know when they are safe and ready for discharge when: "When I am calm about my brother." Does patient have access to transportation?: Yes Does patient have financial barriers related to discharge medications?: No Will patient be returning to same living situation after discharge?: Yes  Summary/Recommendations:   Summary and Recommendations (to be completed by the evaluator): Lucas Knapp is a 32 YO AA male diagnosed with Schizophrenia.  He presents IVC'd by the ED for disorganized thinking.  He admits to stopping his medication "because it was building up in my system," and was hospitalized just last month at [facility].  Previous to that he was in [place] in January of 18.  At d/c, Lucas Knapp will return home iwth his twin brother and follow up with his current outpatient provider.  While here, he can benefti from crises stabiilization, medication management, thertapeutic milieu and referral for services.  Ida Rogue. 07/28/2018

## 2018-07-28 NOTE — Progress Notes (Signed)
Recreation Therapy Notes  INPATIENT RECREATION THERAPY ASSESSMENT  Patient Details Name: Lucas Knapp MRN: [id number] DOB: 06/25/1986 Today's Date: 07/28/2018       Information Obtained From: Patient  Able to Participate in Assessment/Interview: Yes  Patient Presentation: Alert, Hyperverbal  Reason for Admission (Per Patient): Other (Comments)(Pt stated he came for a follow-up from a physical and get an EKG.)  Patient Stressors: Family(Pt stated his brother was his biggest stressor.)  Coping Skills:   Film/video editorsolation, Sports, TV, Arguments, Music, Substance Abuse, Prayer, Art, Avoidance  Leisure Interests (2+):  Games - Other (Comment), Exercise - Walking  Frequency of Recreation/Participation: Other (Comment)(Been awhile)  Awareness of Community Resources:  Yes  Community Resources:  Park, Engineering geologistLibrary, Other (Comment)(IRC)  Current Use: Yes  If no, Barriers?:    Expressed Interest in State Street CorporationCommunity Resource Information: No  [place]  Patient Main Form of Transportation: Set designerCar  Patient Strengths:  Ability to learn; Put family first  Patient Identified Areas of Improvement:  Communication with brother  Patient Goal for Hospitalization:  "Take my time and learn"  Current SI (including self-harm):  No  Current HI:  No  Current AVH: No  Staff Intervention Plan: Group Attendance, Collaborate with Interdisciplinary Treatment Team  Consent to Intern Participation: N/A   Caroll RancherMarjette Kenyen Candy, LRT/CTRS  Caroll RancherLindsay, Larue Lightner A 07/28/2018, 1:52 PM

## 2018-07-28 NOTE — [facility] Suicide Risk Assessment (Signed)
[facility]   Nursing information obtained from:  Patient Demographic factors:  Male, Low socioeconomic status Current Mental Status:  NA Loss Factors:  NA Historical Factors:  NA Risk Reduction Factors:  NA  Total Time spent with patient: 1 hour Principal Problem: Schizophrenia, paranoid type (HCC) Diagnosis:   Patient Active Problem List   Diagnosis Date Noted  . Preventative health care [Z00.00] 09/29/2016  . HTN (hypertension) [I10] 02/18/2016  . Hyperlipidemia [E78.5] 02/18/2016  . Fatigue [R53.83] 02/18/2016  . Low back pain [M54.5] 02/18/2016  . Abdominal pain [R10.9] 11/27/2014  . Schizophrenia, paranoid type (HCC) [F20.0] 09/17/2012    Class: Acute  . Cannabis abuse [F12.10] 09/17/2012   Subjective Data: See H&P for details  Continued Clinical Symptoms:  Alcohol Use Disorder Identification Test Final Score (AUDIT): 0 The "Alcohol Use Disorders Identification Test", Guidelines for Use in Primary Care, Second Edition.  World Science writerHealth Organization [facility](WHO). Score between 0-7:  no or low risk or alcohol related problems. Score between 8-15:  moderate risk of alcohol related problems. Score between 16-19:  high risk of alcohol related problems. Score 20 or above:  warrants further diagnostic evaluation for alcohol dependence and treatment.   CLINICAL FACTORS:   Severe Anxiety and/or Agitation Schizophrenia:   Less than 34 years old Paranoid or undifferentiated type   Psychiatric Specialty Exam: Physical Exam  Nursing note and vitals reviewed.   ROS- See H&P for details  Blood pressure (!) 128/100, pulse 80, temperature 97.8 F (36.6 C), temperature source Oral, resp. rate 20, height 5\' 4"  (1.626 m), weight 101.3 kg (223 lb 6.4 oz), SpO2 100 %.Body mass index is 38.35 kg/m.    COGNITIVE FEATURES THAT CONTRIBUTE TO RISK:  None    SUICIDE RISK:   Minimal: No identifiable suicidal ideation.  Patients presenting with no risk factors but with  morbid ruminations; may be classified as minimal risk based on the severity of the depressive symptoms  PLAN OF CARE: See H&P for details  I certify that inpatient services furnished can reasonably be expected to improve the patient's condition.   Micheal Likenshristopher T Keyli Duross, MD 07/28/2018, 1:45 PM

## 2018-07-28 NOTE — [facility] Group Notes (Signed)
LCSW Group Therapy Note  07/28/2018 1:15pm  Type of Therapy/Topic:  Group Therapy:  Emotion Regulation  Participation Level:  None   Description of Group:   The purpose of this group is to assist patients in learning to regulate negative emotions and experience positive emotions. Patients will be guided to discuss ways in which they have been vulnerable to their negative emotions. These vulnerabilities will be juxtaposed with experiences of positive emotions or situations, and patients will be challenged to use positive emotions to combat negative ones. Special emphasis will be placed on coping with negative emotions in conflict situations, and patients will process healthy conflict resolution skills.  Therapeutic Goals: 1. Patient will identify two positive emotions or experiences to reflect on in order to balance out negative emotions 2. Patient will label two or more emotions that they find the most difficult to experience 3. Patient will demonstrate positive conflict resolution skills through discussion and/or role plays  Summary of Patient Progress:  Spent much of the time standing at the shelf making a cup of coffee and talking to himself non-stop.  Was gnerally under his breath-so not too disruptive.  "I'm feeling jittery."     Therapeutic Modalities:   Cognitive Behavioral Therapy Feelings Identification Dialectical Behavioral Therapy   Ida RogueRodney B Kanita Delage, LCSW 07/28/2018 11:21 AM

## 2018-07-29 LAB — TSH: TSH: 2.952 u[IU]/mL (ref 0.350–4.500)

## 2018-07-29 MED ORDER — HALOPERIDOL LACTATE 5 MG/ML IJ SOLN: 5.0000 mg | Freq: Three times a day (TID) | INTRAMUSCULAR | Status: DC | PRN

## 2018-07-29 MED ORDER — HALOPERIDOL 5 MG PO TABS
5.0000 mg | ORAL_TABLET | Freq: Three times a day (TID) | ORAL | Status: DC | PRN
  Administered 2018-07-29 – 2018-08-04 (×5): 5 mg via ORAL
  Filled 2018-07-29 (×3): qty 1

## 2018-07-29 NOTE — Progress Notes (Signed)
Recreation Therapy Notes  Date: 8.8.19 Time: 0950 Location: [address] Dayroom  Group Topic:  Goal Setting  Goal Area(s) Addresses:  Patient will be able to identify at least 3 life goals.  Patient will be able to identify benefit of investing in life goals.  Patient will be able to identify benefit of setting life goals post d/c.   Behavioral Response: Hyperverbal  Intervention: Worksheet, pencils  Activity: Life Goals.  Patients were given a work sheet with six categories (family, friends, work/school, spirituality, body and mental health).  Patients were to identify what they were doing well, what they needed to improve and write a goal to help them make the improvement.  Patients would then share their top 3 categories with the group.  Education: Discharge Planning, PharmacologistCoping Skills, Leisure Education  Education Outcome: Acknowledges Education/In Group Clarification Provided/Needs Additional Education  Clinical Observations: Pt needed constant redirection.  Pt had a hard time focusing on the activity.  Pt responses were off topic and made no sense.   Caroll RancherMarjette Kalisi Bevill, LRT/CTRS    Caroll RancherLindsay, Lilyanah Celestin A 07/29/2018 11:36 AM

## 2018-07-29 NOTE — Plan of Care (Signed)
  Problem: Role Relationship: Goal: Ability to interact with others will improve Outcome: Not Progressing-he remains very psychotic and unable to have logical conversation with peers   Problem: Education: Goal: Will be free of psychotic symptoms Outcome: Not Progressing-he remains very psychotic-tangential-paranoid.   Problem: Health Behavior/Discharge Planning: Goal: Compliance with prescribed medication regimen will improve Outcome: Progressing-He took hs medications without difficulty.

## 2018-07-29 NOTE — [facility] Group Notes (Signed)
LCSW Group Therapy Note   07/29/2018 1:15pm   Type of Therapy and Topic:  Group Therapy:  Overcoming Obstacles   Participation Level:  Minimal   Description of Group:    In this group patients will be encouraged to explore what they see as obstacles to their own wellness and recovery. They will be guided to discuss their thoughts, feelings, and behaviors related to these obstacles. The group will process together ways to cope with barriers, with attention given to specific choices patients can make. Each patient will be challenged to identify changes they are motivated to make in order to overcome their obstacles. This group will be process-oriented, with patients participating in exploration of their own experiences as well as giving and receiving support and challenge from other group members.   Therapeutic Goals: 1. Patient will identify personal and current obstacles as they relate to admission. 2. Patient will identify barriers that currently interfere with their wellness or overcoming obstacles.  3. Patient will identify feelings, thought process and behaviors related to these barriers. 4. Patient will identify two changes they are willing to make to overcome these obstacles:      Summary of Patient Progress   In and out several times.  Muttering to himself when present, interjecting when others where speaking.  Some of it intelligible, most of it not.  Group members did a nice job of ignoring him and continuing on.   Therapeutic Modalities:   Cognitive Behavioral Therapy Solution Focused Therapy Motivational Interviewing Relapse Prevention Therapy  Ida RogueRodney B Jhane Lorio, LCSW 07/29/2018 4:17 PM

## 2018-07-29 NOTE — Progress Notes (Signed)
D:  Lucas Knapp was up and pacing the unit.  He was loud, intrusive, hyper-verbal, tangential and difficult to redirect at times.  He was noted loudly talking on the phone and the conversation was very disorganized.  He continues to talk about the [employer]" and conspiracies.  He denied any pain or discomfort and appeared to be in no physical distress.  He denied SI/HI or A/V hallucinations.  He took his hs medications without difficulty.  PRN for agitation/psychosis given with his hs medications.   A:  1:1 with RN for support and encouragement.  Medications as ordered.  Q 15 minute checks maintained for safety.  Encouraged participation in group and unit activities.   R:  Lucas Knapp remains safe on the unit.  We will continue to monitor the progress towards his goals.

## 2018-07-29 NOTE — Progress Notes (Addendum)
[facility] MD Progress Note  07/29/2018 1:40 PM [name]  MRN:  [id number] Subjective:    Lucas Knapp is a 32 y/o M with history of schizophrenia who was admitted voluntarily from [facility] where he presented with worsening symptoms of disorganization and psychosis including disorganizaed speech, flight of ideas, and AH. He reported being off of medications for an unclear amount of time. He was medically cleared and then transferred to [facility] for additional treatment and stabilization. He was started on trial of haldol to address symptoms of psychosis and disorganization.  Today upon evaluation, pt shares, "I'm good." He is pleasant and generally cooperative with the interview; however, he remains disorganized, tangential, and generally a poor historian. 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 He reports he is sleeping well. His appetite is good. He denies SI/HI/AH/VH. He is tolerating his medications well, and we discussed potential option of transitioning to long-acting injectable form of haldol. Pt states he will consider this option, but he would be open to it as early as starting it tomorrow. Pt is in agreement to continue his current regimen without changes.   Principal Problem: Schizophrenia, paranoid type (HCC) Diagnosis:   Patient Active Problem List   Diagnosis Date Noted  . Preventative health care [Z00.00] 09/29/2016  . HTN (hypertension) [I10] 02/18/2016  . Hyperlipidemia [E78.5] 02/18/2016  . Fatigue [R53.83] 02/18/2016  . Low back pain [M54.5] 02/18/2016  . Abdominal pain [R10.9] 11/27/2014  . Schizophrenia, paranoid type (HCC) [F20.0] 09/17/2012    Class: Acute  . Cannabis abuse [F12.10] 09/17/2012   Total Time spent with patient: 30 minutes  Past Psychiatric History: see H&P for details  Past Medical History:  Past  Medical History:  Diagnosis Date  . Back pain   . Bipolar 1 disorder (HCC)   . Hyperlipidemia   . Hypertension    pt has been prescribed HCTZ 12.5 mg daily. Pt was 140/80 on admission.   . Schizophrenia (HCC)    History reviewed. No pertinent surgical history. Family History:  Family History  Problem Relation Age of Onset  . Hypertension Father   . Hyperlipidemia Father   . Hypertension Paternal Grandmother   . Hypertension Paternal Grandfather   . Hypertension Brother        identical twin  . Psychosis Brother        "mental issues"     Family Psychiatric  History: see H&P for details Social History:  Social History   Substance and Sexual Activity  Alcohol Use Yes  . Alcohol/week: 0.0 standard drinks   Comment: occasionally --"I need to drink more"     Social History   Substance and Sexual Activity  Drug Use Yes  . Types: Marijuana   Comment: Pt endorsed used of marijuana    Social History   Socioeconomic History  . Marital status: Single    Spouse name: Not on file  . Number of children: Not on file  . Years of education: Not on file  . Highest education level: Not on file  Occupational History  . Not on file  Social Needs  . Financial resource strain: Not on file  . Food insecurity:    Worry: Not on file    Inability: Not on file  . Transportation needs:    Medical: Not on file    Non-medical: Not on  file  Tobacco Use  . Smoking status: Current Every Day Smoker    Types: Cigarettes  . Smokeless tobacco: Never Used  . Tobacco comment: 10-15  Substance and Sexual Activity  . Alcohol use: Yes    Alcohol/week: 0.0 standard drinks    Comment: occasionally --"I need to drink more"  . Drug use: Yes    Types: Marijuana    Comment: Pt endorsed used of marijuana  . Sexual activity: Not on file  Lifestyle  . Physical activity:    Days per week: Not on file    Minutes per session: Not on file  . Stress: Not on file  Relationships  . Social connections:     Talks on phone: Not on file    Gets together: Not on file    Attends religious service: Not on file    Active member of club or organization: Not on file    Attends meetings of clubs or organizations: Not on file    Relationship status: Not on file  Other Topics Concern  . 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 Also works for the [employer] one day a week.    Single   No children   Completed college (bachelors in Statisticianlectronic technology)   Enjoys swimming, playing pool, walking   [place]  Has identical twin and 2 other brothers in [place]   Additional Social History:                         Sleep: Good  Appetite:  Good  Current Medications: Current Facility-Administered Medications  Medication Dose Route Frequency Provider Last Rate Last Dose  . acetaminophen (TYLENOL) tablet 650 mg  650 mg Oral Q6H PRN Charm RingsLord, Jamison Y, NP   650 mg at 07/27/18 2047  . alum & mag hydroxide-simeth (MAALOX/MYLANTA) 200-200-20 MG/5ML suspension 30 mL  30 mL Oral Q4H PRN Charm RingsLord, Jamison Y, NP      . benztropine (COGENTIN) tablet 0.5 mg  0.5 mg Oral Daily Charm RingsLord, Jamison Y, NP   0.5 mg at 07/29/18 [id number]  . diphenhydrAMINE (BENADRYL) injection 50 mg  50 mg Intramuscular Once PRN Charm RingsLord, Jamison Y, NP      . haloperidol (HALDOL) tablet 5 mg  5 mg Oral BID Charm RingsLord, Jamison Y, NP   5 mg at 07/29/18 [id number]  . hydrochlorothiazide (MICROZIDE) capsule 12.5 mg  12.5 mg Oral Daily Jolyne Loaainville, Uriah Trueba T, MD   12.5 mg at 07/29/18 [id number]  . hydrOXYzine (ATARAX/VISTARIL) tablet 25 mg  25 mg Oral TID PRN Laveda AbbeParks, Laurie Britton, NP   25 mg at 07/29/18 1106  . magnesium hydroxide (MILK OF MAGNESIA) suspension 30 mL  30 mL Oral Daily PRN Charm RingsLord, Jamison Y, NP      . nicotine (NICODERM CQ - dosed in mg/24 hours) patch 21 mg  21 mg Transdermal Daily PRN Micheal Likensainville, Najeeb Uptain T, MD   21 mg at 07/28/18 1455  . OLANZapine (ZYPREXA) injection 10 mg  10 mg  Intramuscular Once PRN Charm RingsLord, Jamison Y, NP      . pantoprazole (PROTONIX) EC tablet 80 mg  80 mg Oral Daily Charm RingsLord, Jamison Y, NP   80 mg at 07/29/18 [id number]  . pravastatin (PRAVACHOL) tablet 20 mg  20 mg Oral q1800 Charm RingsLord, Jamison Y, NP   20 mg at 07/28/18 1823  . traZODone (DESYREL) tablet 100 mg  100 mg Oral  QHS,MR X 1 Kerry Hough, PA-C   100 mg at 07/28/18 2234    Lab Results: No results found for this or any previous visit (from the past 48 hour(s)).  Blood Alcohol level:  Lab Results  Component Value Date   ETH <10 07/27/2018   ETH <10 06/28/2018    Metabolic Disorder Labs: No results found for: HGBA1C, MPG No results found for: PROLACTIN Lab Results  Component Value Date   CHOL 249 (H) 03/29/2018   TRIG 239.0 (H) 03/29/2018   HDL 34.70 (L) 03/29/2018   CHOLHDL 7 03/29/2018   VLDL 47.8 (H) 03/29/2018   LDLCALC 150 (H) 09/29/2016    Physical Findings: AIMS:  , ,  ,  ,    CIWA:    COWS:     Musculoskeletal: Strength & Muscle Tone: within normal limits Gait & Station: normal Patient leans: N/A  Psychiatric Specialty Exam: Physical Exam  Nursing note and vitals reviewed.   Review of Systems  Constitutional: Negative for chills and fever.  Respiratory: Negative for cough and shortness of breath.   Cardiovascular: Negative for chest pain.  Gastrointestinal: Negative for abdominal pain, heartburn, nausea and vomiting.  Psychiatric/Behavioral: Negative for depression, hallucinations and suicidal ideas. The patient is not nervous/anxious and does not have insomnia.     Blood pressure (!) 141/101, pulse (!) 111, temperature 98.8 F (37.1 C), temperature source Oral, resp. rate 18, height 5\' 4"  (1.626 m), weight 101.3 kg, SpO2 100 %.Body mass index is 38.35 kg/m.  General Appearance: Casual and Fairly Groomed  Eye Contact:  Good  Speech:  Clear and Coherent and Normal Rate  Volume:  Normal  Mood:  Euthymic  Affect:  Appropriate, Blunt and Congruent  Thought Process:   Coherent, Disorganized and Descriptions of Associations: Tangential  Orientation:  Full (Time, Place, and Person)  Thought Content:  Tangential  Suicidal Thoughts:  No  Homicidal Thoughts:  No  Memory:  Immediate;   Fair Recent;   Fair Remote;   Fair  Judgement:  Poor  Insight:  Lacking  Psychomotor Activity:  Normal  Concentration:  Concentration: Fair  Recall:  [employer] of Knowledge:  Fair  Language:  Fair  Akathisia:  No  Handed:    AIMS (if indicated):     Assets:  Resilience Social Support  ADL's:  Intact  Cognition:  WNL  Sleep:  Number of Hours: 6.75   Treatment Plan Summary: Daily contact with patient to assess and evaluate symptoms and progress in treatment and Medication management   -Continue inpatient hospitalization  -Schizophrenia, paranoid type   -Continue haldol 5mg  po BID  -HTN   -Continue HCTZ 12.5mg  po qDay  -anxiety  -Continue vistaril 25mg  po TID prn anxiety  -psychosis/agitation  -Continue haldol 5mg  po/IM q8h prn agitation/psychosis  -EPS   -Continue cogentin 0.5mg  po qDay  -GERD  -Continue protonix 80mg  po qDay  -HLD    -Continue pravastatin 20mg  po qDay  -insomnia  -Continue trazodone 100mg  po qhs prn insomnia (may repeat x1)  -Encourage participation in groups and therapeutic milieu  -disposition planning may be ongoing   Micheal Likens, MD 07/29/2018, 1:40 PM

## 2018-07-29 NOTE — Progress Notes (Signed)
D:  Lucas Knapp has been up and pacing the unit.  He is hyper-verbal, suspicious, irritable and demanding.  Speech is rambling and has word salad at times.  He is paranoid and gets loud at times.  Peers mentioned that they were afraid to be around him.  He is hyper-focused on missing $20.00 bill and stated "If I can't find it, I am going to call the magistrate because it's not fair.  I need my money."  Staffed with Lucas SievertSpencer Simon PA about getting PRN orders since he is agitated and is having difficulty resting.  PRN given for sleep and Thorazine given as one time dosage for agitation/psychosis.  He is currently resting with his eyes closed and appears to be asleep.   A:  1:1 with RN for support and encouragement.  Medication as ordered and prn.  Q 15 minute checks maintained for safety.  Encouraged participation in group and unit activities. R:  Lucas Knapp remains safe on the unit.  We will continue to monitor the progress towards his goals.

## 2018-07-29 NOTE — Progress Notes (Signed)
D:  When this writer met Lucas Knapp, he came to the med window and sat down directly in front of it, describing the action as a fall. This was witnessed by several staff as the patient sitting down in a deliberate manner, slowly moving in the manner of someone intentionally, comfortably sitting down and not falling. He then asked for a wheelchair, which was provided. He then asked for 16 pills and Geodon, which is on the patient's chart as an allergy. He was difficult to engage in conversation, but he did take medications as ordered with no adverse effects noted. He has spent much of the day on the phone, speaking in a loud, disorganized manner.  A: Meds given as ordered. Q15 safety checks maintained. Support/encouragement offered.  R: Pt remains free from harm and continues with treatment. Will continue to monitor for needs/safety.  

## 2018-07-30 LAB — HEMOGLOBIN A1C
Hgb A1c MFr Bld: 5.1 % (ref 4.8–5.6)
Mean Plasma Glucose: 99.67 mg/dL

## 2018-07-30 MED ORDER — HALOPERIDOL DECANOATE 100 MG/ML IM SOLN
100.0000 mg | INTRAMUSCULAR | Status: DC
  Administered 2018-07-30: 100 mg via INTRAMUSCULAR
  Filled 2018-07-30: qty 1

## 2018-07-30 NOTE — Progress Notes (Signed)
[facility] MD Progress Note  07/30/2018 4:38 PM Lucas Knapp  MRN:  [id number] Subjective:    Lucas Knapp is a 32 y/o M with history of schizophrenia who was admitted voluntarily from [facility] where he presented with worsening symptoms of disorganization and psychosis including disorganizaed speech, flight of ideas, and AH. He reported being off of medications for an unclear amount of time. He was medically cleared and then transferred to [facility] for additional treatment and stabilization. He was started on trial of haldol to address symptoms of psychosis and disorganization.  Today upon evaluation, pt shares, "My brother always triggers me over nothing - we were just talking and he was yelling at me about the water bill." Pt is mildly pressured and tangential, but he is able to be redirected and he is generally cooperative with the interview. He denies other specific concerns today. He is sleeping well. His appetite is good. He denies SI/HI/AH/VH. He is tolerating his medications well. He asks about starting long-acting injectable form of haldol which he has taken in the past, and we discussed that he is tolerating oral form well at this point, so we can transition to LAI form. Pt was in agreement with this plan. He had no further questions, comments, or concerns.  Principal Problem: Schizophrenia, paranoid type (HCC) Diagnosis:   Patient Active Problem List   Diagnosis Date Noted  . Preventative health care [Z00.00] 09/29/2016  . HTN (hypertension) [I10] 02/18/2016  . Hyperlipidemia [E78.5] 02/18/2016  . Fatigue [R53.83] 02/18/2016  . Low back pain [M54.5] 02/18/2016  . Abdominal pain [R10.9] 11/27/2014  . Schizophrenia, paranoid type (HCC) [F20.0] 09/17/2012    Class: Acute  . Cannabis abuse [F12.10] 09/17/2012   Total Time spent with patient: 30 minutes  Past Psychiatric History: see H&P for details  Past Medical History:  Past Medical History:  Diagnosis Date  . Back pain   . Bipolar 1  disorder (HCC)   . Hyperlipidemia   . Hypertension    pt has been prescribed HCTZ 12.5 mg daily. Pt was 140/80 on admission.   . Schizophrenia (HCC)    History reviewed. No pertinent surgical history. Family History:  Family History  Problem Relation Age of Onset  . Hypertension Father   . Hyperlipidemia Father   . Hypertension Paternal Grandmother   . Hypertension Paternal Grandfather   . Hypertension Brother        identical twin  . Psychosis Brother        "mental issues"     Family Psychiatric  History: see H&P Social History:  Social History   Substance and Sexual Activity  Alcohol Use Yes  . Alcohol/week: 0.0 standard drinks   Comment: occasionally --"I need to drink more"     Social History   Substance and Sexual Activity  Drug Use Yes  . Types: Marijuana   Comment: Pt endorsed used of marijuana    Social History   Socioeconomic History  . Marital status: Single    Spouse name: Not on file  . Number of children: Not on file  . Years of education: Not on file  . Highest education level: Not on file  Occupational History  . Not on file  Social Needs  . Financial resource strain: Not on file  . Food insecurity:    Worry: Not on file    Inability: Not on file  . Transportation needs:    Medical: Not on file    Non-medical: Not on file  Tobacco Use  .  Smoking status: Current Every Day Smoker    Types: Cigarettes  . Smokeless tobacco: Never Used  . Tobacco comment: 10-15  Substance and Sexual Activity  . Alcohol use: Yes    Alcohol/week: 0.0 standard drinks    Comment: occasionally --"I need to drink more"  . Drug use: Yes    Types: Marijuana    Comment: Pt endorsed used of marijuana  . Sexual activity: Not on file  Lifestyle  . Physical activity:    Days per week: Not on file    Minutes per session: Not on file  . Stress: Not on file  Relationships  . Social connections:    Talks on phone: Not on file    Gets together: Not on file    Attends  religious service: Not on file    Active member of club or organization: Not on file    Attends meetings of clubs or organizations: Not on file    Relationship status: Not on file  Other Topics Concern  . 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    Single   No children   Completed college (bachelors in Statistician)   Enjoys swimming, playing pool, walking   Grew up [place].  Has identical twin and 2 other brothers in [place]   Additional Social History:                         Sleep: Good  Appetite:  Good  Current Medications: Current Facility-Administered Medications  Medication Dose Route Frequency Provider Last Rate Last Dose  . acetaminophen (TYLENOL) tablet 650 mg  650 mg Oral Q6H PRN Charm Rings, NP   650 mg at 07/27/18 2047  . alum & mag hydroxide-simeth (MAALOX/MYLANTA) 200-200-20 MG/5ML suspension 30 mL  30 mL Oral Q4H PRN Charm Rings, NP      . benztropine (COGENTIN) tablet 0.5 mg  0.5 mg Oral Daily Charm Rings, NP   0.5 mg at 07/30/18 0809  . diphenhydrAMINE (BENADRYL) injection 50 mg  50 mg Intramuscular Once PRN Charm Rings, NP      . haloperidol (HALDOL) tablet 5 mg  5 mg Oral BID Charm Rings, NP   5 mg at 07/30/18 1610  . haloperidol (HALDOL) tablet 5 mg  5 mg Oral Q8H PRN Micheal Likens, MD   5 mg at 07/29/18 2112   Or  . haloperidol lactate (HALDOL) injection 5 mg  5 mg Intramuscular Q8H PRN Micheal Likens, MD      . haloperidol decanoate (HALDOL DECANOATE) 100 MG/ML injection 100 mg  100 mg Intramuscular Q28 days Jolyne Loa T, MD      . hydrochlorothiazide (MICROZIDE) capsule 12.5 mg  12.5 mg Oral Daily Micheal Likens, MD   12.5 mg at 07/30/18 9604  . hydrOXYzine (ATARAX/VISTARIL) tablet 25 mg  25 mg Oral TID PRN Laveda Abbe, NP   25 mg at 07/29/18 1106  . magnesium hydroxide  (MILK OF MAGNESIA) suspension 30 mL  30 mL Oral Daily PRN Charm Rings, NP      . nicotine (NICODERM CQ - dosed in mg/24 hours) patch 21 mg  21 mg Transdermal Daily PRN Micheal Likens, MD   21 mg at 07/29/18 1602  . pantoprazole (PROTONIX) EC tablet 80 mg  80 mg  Oral Daily Charm RingsLord, Jamison Y, NP   80 mg at 07/30/18 [id number]  . pravastatin (PRAVACHOL) tablet 20 mg  20 mg Oral q1800 Charm RingsLord, Jamison Y, NP   20 mg at 07/29/18 1649  . traZODone (DESYREL) tablet 100 mg  100 mg Oral QHS,MR X 1 Kerry HoughSimon, Spencer E, PA-C   100 mg at 07/29/18 2112    Lab Results:  Results for orders placed or performed during the hospital encounter of 07/27/18 (from the past 48 hour(s))  TSH     Status: None   Collection Time: 07/29/18  6:44 PM  Result Value Ref Range   TSH 2.952 0.350 - 4.500 uIU/mL    Comment: Performed by a 3rd Generation assay with a functional sensitivity of <=0.01 uIU/mL. Performed at [facility], [address] [place], [place] [address]   Hemoglobin A1c     Status: None   Collection Time: 07/29/18  6:44 PM  Result Value Ref Range   Hgb A1c MFr Bld 5.1 4.8 - 5.6 %    Comment: (NOTE) Pre diabetes:          5.7%-6.4% Diabetes:              >6.4% Glycemic control for   <7.0% adults with diabetes    Mean Plasma Glucose 99.67 mg/dL    Comment: Performed at [facility], [address] [place], [place] [address]    Blood Alcohol level:  Lab Results  Component Value Date   ETH <10 07/27/2018   ETH <10 06/28/2018    Metabolic Disorder Labs: Lab Results  Component Value Date   HGBA1C 5.1 07/29/2018   MPG 99.67 07/29/2018   No results found for: PROLACTIN Lab Results  Component Value Date   CHOL 249 (H) 03/29/2018   TRIG 239.0 (H) 03/29/2018   HDL 34.70 (L) 03/29/2018   CHOLHDL 7 03/29/2018   VLDL 47.8 (H) 03/29/2018   LDLCALC 150 (H) 09/29/2016    Physical Findings: AIMS:  , ,  ,  ,    CIWA:    COWS:     Musculoskeletal: Strength & Muscle  Tone: within normal limits Gait & Station: normal Patient leans: N/A  Psychiatric Specialty Exam: Physical Exam  Nursing note and vitals reviewed.   Review of Systems  Constitutional: Negative for chills and fever.  Respiratory: Negative for cough and shortness of breath.   Cardiovascular: Negative for chest pain.  Gastrointestinal: Negative for abdominal pain, heartburn, nausea and vomiting.  Psychiatric/Behavioral: Negative for depression, hallucinations and suicidal ideas. The patient is not nervous/anxious and does not have insomnia.     Blood pressure (!) 132/92, pulse 91, temperature 97.9 F (36.6 C), temperature source Oral, resp. rate 18, height 5\' 4"  (1.626 m), weight 101.3 kg, SpO2 99 %.Body mass index is 38.35 kg/m.  General Appearance: Casual and Fairly Groomed  Eye Contact:  Good  Speech:  Clear and Coherent and Pressured  Volume:  Normal  Mood:  Euthymic  Affect:  Appropriate, Blunt and Congruent  Thought Process:  Coherent, Disorganized and Descriptions of Associations: Tangential  Orientation:  Full (Time, Place, and Person)  Thought Content:  Tangential  Suicidal Thoughts:  No  Homicidal Thoughts:  No  Memory:  Immediate;   Fair Recent;   Fair Remote;   Fair  Judgement:  Poor  Insight:  Lacking  Psychomotor Activity:  Normal  Concentration:  Concentration: Fair  Recall:  [employer]Fair  Fund of Knowledge:  Fair  Language:  Fair  Akathisia:  No  Handed:    AIMS (if indicated):     Assets:  Resilience Social Support  ADL's:  Intact  Cognition:  WNL  Sleep:  Number of Hours: 6.75     Treatment Plan Summary: Daily contact with patient to assess and evaluate symptoms and progress in treatment and Medication management   -Continue inpatient hospitalization  -Schizophrenia, paranoid type             -Continue haldol 5mg  po BID   -Start haldol decanoate 100mg  IM q28 (administer today 07/30/18)  -HTN              -Continue HCTZ 12.5mg  po qDay  -anxiety              -Continue vistaril 25mg  po TID prn anxiety  -psychosis/agitation             -Continue haldol 5mg  po/IM q8h prn agitation/psychosis  -EPS             -Continue cogentin 0.5mg  po qDay  -GERD             -Continue protonix 80mg  po qDay  -HLD             -Continue pravastatin 20mg  po qDay  -insomnia             -Continue trazodone 100mg  po qhs prn insomnia (may repeat x1)  -Encourage participation in groups and therapeutic milieu  -disposition planning may be ongoing    Micheal Likens, MD 07/30/2018, 4:38 PM

## 2018-07-30 NOTE — Progress Notes (Signed)
D: Lucas Knapp has been less hyperactive but Lucas Knapp speech continues to be disorganized/tangential. He denied SI, HI, and AVH. Lucas Knapp only complaint was incapacitating back pain. "I need an EKG for my back." He was satisfied with a heat pack and was up on the phone within five minutes. He is pleasant and takes medication with no issues. He tolerated administration of Haldol LAI well.   A: Meds given as ordered. Q15 safety checks maintained. Support/encouragement offered.  R: Pt remains free from harm and continues with treatment. Will continue to monitor for needs/safety.

## 2018-07-30 NOTE — Plan of Care (Signed)
  Problem: Activity: Goal: Sleeping patterns will improve Outcome: Progressing-Lucas Knapp has slept well over the past two nights.   Problem: Coping: Goal: Ability to demonstrate self-control will improve Outcome: Not Progressing-He remains intrusive, hyper-verbal and restless.  We will continue to monitor the progress towards his goals.

## 2018-07-30 NOTE — Progress Notes (Signed)
Recreation Therapy Notes  Date: 8.9.19 Time: 1000 Location: [address] Dayroom  Group Topic: Self-Esteem  Goal Area(s) Addresses:  Patient will successfully identify positive attributes about themselves.  Patient will successfully identify benefit of improved self-esteem.   Behavioral Response: Engaged  Intervention: Worksheet  Activity: Conservator, museum/gallerytrengths and Qualities.  Patients were to identify at least three things they were good at, what they like about their appearance, how they have helped others, what they value most, compliments they have received, challenges they have overcome, things that make them unique and how they have made others happy.  Education:  Self-Esteem, Building control surveyorDischarge Planning.   Education Outcome: Acknowledges education/In group clarification offered/Needs additional education  Clinical Observations/Feedback: Pt needed redirection for being loud and getting off topic.  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     Caroll RancherMarjette Evvie Behrmann, LRT/CTRS    Caroll RancherLindsay, [name] 07/30/2018 11:37 AM

## 2018-07-30 NOTE — [facility] Group Notes (Signed)
LCSW Group Therapy Note  07/30/2018 1:15pm  Type of Therapy/Topic:  Group Therapy:  Balance in Life  Participation Level:  Minimal  Description of Group:    This group will address the concept of balance and how it feels and looks when one is unbalanced. Patients will be encouraged to process areas in their lives that are out of balance and identify reasons for remaining unbalanced. Facilitators will guide patients in utilizing problem-solving interventions to address and correct the stressor making their life unbalanced. Understanding and applying boundaries will be explored and addressed for obtaining and maintaining a balanced life. Patients will be encouraged to explore ways to assertively make their unbalanced needs known to significant others in their lives, using other group members and facilitator for support and feedback.  Therapeutic Goals: 1. Patient will identify two or more emotions or situations they have that consume much of in their lives. 2. Patient will identify signs/triggers that life has become out of balance:  3. Patient will identify two ways to set boundaries in order to achieve balance in their lives:  4. Patient will demonstrate ability to communicate their needs through discussion and/or role plays  Summary of Patient Progress:  In and out multiple times.  Somewhat sedated.  Unable to really track the subject at hand.  Seemed to enjoy rhyming words today.    Therapeutic Modalities:   Cognitive Behavioral Therapy Solution-Focused Therapy Assertiveness Training  Ida RogueRodney B Shaiann Mcmanamon, [place] 07/30/2018 3:58 PM

## 2018-07-30 NOTE — Plan of Care (Signed)
  Problem: Health Behavior/Discharge Planning: Goal: Compliance with treatment plan for underlying cause of condition will improve Outcome: Progressing-Terrius takes his medications as prescribed.  He attends groups and participates.  He is easier to redirect.  He continues to be hyper-verbal and tangential.  We will continue to monitor the progress towards his goals.

## 2018-07-31 LAB — PROLACTIN: PROLACTIN: 30 ng/mL — AB (ref 4.0–15.2)

## 2018-07-31 NOTE — Progress Notes (Addendum)
D.Pt is friendly and cooperative- continues to present with disorganized, speech, incoherent at times. Pt observed in the milieu this am, talking loud, but is easily redirectable- no behavioral issues noted. Pt currently denies SI/HI and AV hallucinations. A. Labs and vitals monitored. Pt compliant with medications. Pt supported emotionally and encouraged to express concerns and ask questions.   R. Pt remains safe with 15 minute checks. Will continue POC.

## 2018-07-31 NOTE — Progress Notes (Addendum)
[facility] MD Progress Note  07/31/2018 1:37 PM Lucas Knapp  MRN:  [id number] Subjective: I am doing better.  Every time I talk to my mom I know I am doing better and making progress.  My mom is 1 of my triggers to this a good thing that I can talk to her.  They switch me from Latuda to Haldol, and the doctor at [facility] told me not to take Haldol anymore because he traps deposits in my body, and it affects my electronic configuration.  I do not think the right side of my brain is working with the left hemisphere.  I wonder when I am going to be discharged because it is raining outside and my serotonin melatonin are off.  It is raining and I left my equipment outside.  I came to the hospital to get a EKG for my back.  Can you tell him to stop force and trazodone all me?  I really need to talk to someone about quantum side effects and was happening to me.  Lucas Knapp is a 32 y/o M with history of schizophrenia who was admitted voluntarily from [facility] where he presented with worsening symptoms of disorganization and psychosis including disorganizaed speech, flight of ideas, and AH. He reported being off of medications for an unclear amount of time. He was medically cleared and then transferred to [facility] for additional treatment and stabilization. He was started on trial of haldol to address symptoms of psychosis and disorganization.  Today upon evaluation, pt shares, "I feel like the shoes I have belonged to another man.  D issues make me feel like another man has walked in my shoes before.  The man who is in my shoes has demons out against me, and does not want to see me doing good.  At the time of this evaluation patient remains disorganized, tangential, loose association, pressured speech, and intermittent periods of word salad.  He denies other specific concerns today. He is sleeping well. His appetite is good. He denies SI/HI/AH/VH. He is tolerating his medications well. He asks about starting long-acting  injectable form of haldol which he has taken in the past, and we discussed that he is tolerating oral form well at this point, so we can transition to LAI form. Pt was in agreement with this plan. He had no further questions, comments, or concerns.  Principal Problem: Schizophrenia, paranoid type (HCC) Diagnosis:   Patient Active Problem List   Diagnosis Date Noted  . Preventative health care [Z00.00] 09/29/2016  . HTN (hypertension) [I10] 02/18/2016  . Hyperlipidemia [E78.5] 02/18/2016  . Fatigue [R53.83] 02/18/2016  . Low back pain [M54.5] 02/18/2016  . Abdominal pain [R10.9] 11/27/2014  . Schizophrenia, paranoid type (HCC) [F20.0] 09/17/2012    Class: Acute  . Cannabis abuse [F12.10] 09/17/2012   Total Time spent with patient: 30 minutes  Past Psychiatric History: see H&P for details  Past Medical History:  Past Medical History:  Diagnosis Date  . Back pain   . Bipolar 1 disorder (HCC)   . Hyperlipidemia   . Hypertension    pt has been prescribed HCTZ 12.5 mg daily. Pt was 140/80 on admission.   . Schizophrenia (HCC)    History reviewed. No pertinent surgical history. Family History:  Family History  Problem Relation Age of Onset  . Hypertension Father   . Hyperlipidemia Father   . Hypertension Paternal Grandmother   . Hypertension Paternal Grandfather   . Hypertension Brother  identical twin  . Psychosis Brother        "mental issues"     Family Psychiatric  History: see H&P Social History:  Social History   Substance and Sexual Activity  Alcohol Use Yes  . Alcohol/week: 0.0 standard drinks   Comment: occasionally --"I need to drink more"     Social History   Substance and Sexual Activity  Drug Use Yes  . Types: Marijuana   Comment: Pt endorsed used of marijuana    Social History   Socioeconomic History  . Marital status: Single    Spouse name: Not on file  . Number of children: Not on file  . Years of education: Not on file  . Highest  education level: Not on file  Occupational History  . Not on file  Social Needs  . Financial resource strain: Not on file  . Food insecurity:    Worry: Not on file    Inability: Not on file  . Transportation needs:    Medical: Not on file    Non-medical: Not on file  Tobacco Use  . Smoking status: Current Every Day Smoker    Types: Cigarettes  . Smokeless tobacco: Never Used  . Tobacco comment: 10-15  Substance and Sexual Activity  . Alcohol use: Yes    Alcohol/week: 0.0 standard drinks    Comment: occasionally --"I need to drink more"  . Drug use: Yes    Types: Marijuana    Comment: Pt endorsed used of marijuana  . Sexual activity: Not on file  Lifestyle  . Physical activity:    Days per week: Not on file    Minutes per session: Not on file  . Stress: Not on file  Relationships  . Social connections:    Talks on phone: Not on file    Gets together: Not on file    Attends religious service: Not on file    Active member of club or organization: Not on file    Attends meetings of clubs or organizations: Not on file    Relationship status: Not on file  Other Topics Concern  . 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 Also works for the [employer] one day a week.    Single   No children   Completed college (bachelors in Statisticianlectronic technology)   Enjoys swimming, playing pool, walking   [place]  Has identical twin and 2 other brothers in [place]   Additional Social History:                         Sleep: Good  Appetite:  Good  Current Medications: Current Facility-Administered Medications  Medication Dose Route Frequency Provider Last Rate Last Dose  . acetaminophen (TYLENOL) tablet 650 mg  650 mg Oral Q6H PRN Charm RingsLord, Jamison Y, NP   650 mg at 07/31/18 1300  . alum & mag hydroxide-simeth (MAALOX/MYLANTA) 200-200-20 MG/5ML suspension 30 mL  30 mL Oral Q4H PRN Charm RingsLord, Jamison Y, NP      . benztropine  (COGENTIN) tablet 0.5 mg  0.5 mg Oral Daily Charm RingsLord, Jamison Y, NP   0.5 mg at 07/31/18 0753  . diphenhydrAMINE (BENADRYL) injection 50 mg  50 mg Intramuscular Once PRN Charm RingsLord, Jamison Y, NP      . haloperidol (HALDOL) tablet 5 mg  5 mg Oral BID Charm RingsLord, Jamison Y, NP   5 mg  at 07/31/18 0754  . haloperidol (HALDOL) tablet 5 mg  5 mg Oral Q8H PRN Micheal Likens, MD   5 mg at 07/30/18 2108   Or  . haloperidol lactate (HALDOL) injection 5 mg  5 mg Intramuscular Q8H PRN Micheal Likens, MD      . haloperidol decanoate (HALDOL DECANOATE) 100 MG/ML injection 100 mg  100 mg Intramuscular Q28 days Jolyne Loa T, MD   100 mg at 07/30/18 1719  . hydrochlorothiazide (MICROZIDE) capsule 12.5 mg  12.5 mg Oral Daily Micheal Likens, MD   12.5 mg at 07/31/18 0754  . hydrOXYzine (ATARAX/VISTARIL) tablet 25 mg  25 mg Oral TID PRN Laveda Abbe, NP   25 mg at 07/31/18 1301  . magnesium hydroxide (MILK OF MAGNESIA) suspension 30 mL  30 mL Oral Daily PRN Charm Rings, NP      . nicotine (NICODERM CQ - dosed in mg/24 hours) patch 21 mg  21 mg Transdermal Daily PRN Micheal Likens, MD   21 mg at 07/31/18 1132  . pantoprazole (PROTONIX) EC tablet 80 mg  80 mg Oral Daily Charm Rings, NP   80 mg at 07/31/18 0753  . pravastatin (PRAVACHOL) tablet 20 mg  20 mg Oral q1800 Charm Rings, NP   20 mg at 07/30/18 1719  . traZODone (DESYREL) tablet 100 mg  100 mg Oral QHS,MR X 1 Kerry Hough, PA-C   100 mg at 07/31/18 1610    Lab Results:  Results for orders placed or performed during the hospital encounter of 07/27/18 (from the past 48 hour(s))  TSH     Status: None   Collection Time: 07/29/18  6:44 PM  Result Value Ref Range   TSH 2.952 0.350 - 4.500 uIU/mL    Comment: Performed by a 3rd Generation assay with a functional sensitivity of <=0.01 uIU/mL. Performed at [facility], [address], [place], [place] [address]   Hemoglobin A1c      Status: None   Collection Time: 07/29/18  6:44 PM  Result Value Ref Range   Hgb A1c MFr Bld 5.1 4.8 - 5.6 %    Comment: (NOTE) Pre diabetes:          5.7%-6.4% Diabetes:              >6.4% Glycemic control for   <7.0% adults with diabetes    Mean Plasma Glucose 99.67 mg/dL    Comment: Performed at [facility], [address] [place], [place] [address]  Prolactin     Status: Abnormal   Collection Time: 07/29/18  6:44 PM  Result Value Ref Range   Prolactin 30.0 (H) 4.0 - 15.2 ng/mL    Comment: (NOTE) Performed At: [facility] [address] [place], [place] [id number] Jolene Schimke MD AO:[id number]     Blood Alcohol level:  Lab Results  Component Value Date   [facility] <10 07/27/2018   ETH <10 06/28/2018    Metabolic Disorder Labs: Lab Results  Component Value Date   HGBA1C 5.1 07/29/2018   MPG 99.67 07/29/2018   Lab Results  Component Value Date   PROLACTIN 30.0 (H) 07/29/2018   Lab Results  Component Value Date   CHOL 249 (H) 03/29/2018   TRIG 239.0 (H) 03/29/2018   HDL 34.70 (L) 03/29/2018   CHOLHDL 7 03/29/2018   VLDL 47.8 (H) 03/29/2018   LDLCALC 150 (H) 09/29/2016    Physical Findings: AIMS:  , ,  ,  ,  CIWA:    COWS:     Musculoskeletal: Strength & Muscle Tone: within normal limits Gait & Station: normal Patient leans: N/A  Psychiatric Specialty Exam: Physical Exam  Nursing note and vitals reviewed.   Review of Systems  Constitutional: Negative for chills and fever.  Respiratory: Negative for cough and shortness of breath.   Cardiovascular: Negative for chest pain.  Gastrointestinal: Negative for abdominal pain, heartburn, nausea and vomiting.  Psychiatric/Behavioral: Negative for depression, hallucinations and suicidal ideas. The patient is not nervous/anxious and does not have insomnia.     Blood pressure 129/85, pulse 92, temperature 98.4 F (36.9 C), temperature source Oral, resp. rate 18, height 5\' 4"  (1.626 m), weight  101.3 kg, SpO2 99 %.Body mass index is 38.35 kg/m.  General Appearance: Casual and Fairly Groomed  Eye Contact:  Good  Speech:  Clear and Coherent and Pressured  Volume:  Normal  Mood:  Euphoric  Affect:  Appropriate, Blunt, Congruent and Full Range  Thought Process:  Coherent, Disorganized and Descriptions of Associations: Tangential  Orientation:  Full (Time, Place, and Person)  Thought Content:  Tangential  Suicidal Thoughts:  No  Homicidal Thoughts:  No  Memory:  Immediate;   Fair Recent;   Fair Remote;   Fair  Judgement:  Poor  Insight:  Lacking  Psychomotor Activity:  Normal  Concentration:  Concentration: Fair  Recall:  [employer] of Knowledge:  Fair  Language:  Fair  Akathisia:  No  Handed:    AIMS (if indicated):     Assets:  Resilience Social Support  ADL's:  Intact  Cognition:  WNL  Sleep:  Number of Hours: 4.75     Treatment Plan Summary: Daily contact with patient to assess and evaluate symptoms and progress in treatment and Medication management   -Continue inpatient hospitalization  -Schizophrenia, paranoid type             -Continue haldol 5mg  po BID   -Continue haldol decanoate 100mg  IM q28 (administer today 07/30/18)  -HTN              -Continue HCTZ 12.5mg  po qDay  -anxiety             -Continue vistaril 25mg  po TID prn anxiety  -psychosis/agitation             -Continue haldol 5mg  po/IM q8h prn agitation/psychosis  -EPS             -Continue cogentin 0.5mg  po qDay  -GERD             -Continue protonix 80mg  po qDay  -HLD             -Continue pravastatin 20mg  po qDay  -insomnia             -Continue trazodone 100mg  po qhs prn insomnia (may repeat x1)  -Encourage participation in groups and therapeutic milieu  -disposition planning may be ongoing    Truman Hayward, FNP 07/31/2018, 1:37 PM   ..Agree with NP Progress Note

## 2018-07-31 NOTE — Progress Notes (Signed)
D:  Lucas Knapp has been up and visible on the unit.  He denied SI/HI or A/V hallucinations.  He continues to be intrusive, bizarre and incoherent with his speech at times but has been easier to redirect this evening.  He is pleasant and cooperative.  He took his hs medications without difficulty and is currently in his room resting with his eyes closed.  He appears to be asleep.   A:  1:1 with RN for support and encouragement.  Medications as ordered.  Q 15 minute checks maintained for safety.  Encouraged participation in group and unit activities.   R:  Lucas Knapp remains safe on the unit.  We will continue to monitor the progress towards his goals.

## 2018-07-31 NOTE — Progress Notes (Signed)
   07/31/18 0500  Sleep  Number of Hours 4.75   Trazodone 100 X 2 given last night. Pt restless and came up to NS frequently.

## 2018-07-31 NOTE — [facility] Group Notes (Signed)
  [facility] LCSW Group Therapy Note  Date/Time:  07/31/2018 11:15AM-12:00PM  Type of Therapy and Topic:  Group Therapy:  Feelings About Hospitalization  Participation Level:  Active   Description of Group This process group involved patients discussing their feelings related to being hospitalized, as well as the benefits they see to being in the hospital.  These feelings and benefits were itemized.  The group then brainstormed specific ways in which they could seek those same benefits when they discharge and return home.  Therapeutic Goals 1. Patient will identify and describe positive and negative feelings related to hospitalization 2. Patient will verbalize benefits of hospitalization to themselves personally 3. Patients will brainstorm together ways they can obtain similar benefits in the outpatient setting, identify barriers to wellness and possible solutions  Summary of Patient Progress:  The patient expressed his primary feelings about being hospitalized are that "it's okay, but like a jail.  It's locked up and we can't get snacks when we want, or coffee.  I feel like I'm being held hostage."  He was monopolizing throughout group and was mainly focused on how he cannot trust hospitals any longer since he has been placed under IVC, and he no longer feels it is safe to go to a hospital seeking assistance because this may happen again.  He lacked insight and was difficult to redirect.  Therapeutic Modalities Cognitive Behavioral Therapy Motivational Interviewing    Ambrose MantleMareida Grossman-Orr, LCSW 07/31/2018, 1:29 PM

## 2018-07-31 NOTE — [facility] Group Notes (Signed)
[facility] Group Notes:  (Nursing/MHT/Case Management/Adjunct)  Date:  07/31/2018  Time:  4:56 PM  Type of Therapy:  Nurse Education  Participation Level:  Active  Participation Quality:  Appropriate and Attentive  Affect:  Appropriate  Cognitive:  Alert and Appropriate  Insight:  Good  Engagement in Group:  Engaged  Modes of Intervention:  Activity  Summary of Progress/Problems: In this group, we explored meditation by listening to and practicing a guided meditation. We then discussed where each patient was born, and tried to spark conversation. The patient participated in group therapy session and appeared to be meditating.  Kirstie MirzaJonathan C Aariah Godette 07/31/2018, 4:56 PM

## 2018-08-01 NOTE — Plan of Care (Signed)
Problem: Safety: Goal: Periods of time without injury will increase Outcome: Progressing   Problem: Activity: Goal: Will verbalize the importance of balancing activity with adequate rest periods Outcome: Progressing   Problem: Nutritional: Goal: Ability to achieve adequate nutritional intake will improve Outcome: Progressing D: Pt has been intrusive, hyper verbal, tangential with loose associations when engaged. A & O to self and place. Denies SI, HI, AVH and pain when assessed. Presents animated. Attended all scheduled groups, needed redirection due to difficulty staying on topic. Reports he's eating and sleeping well. Remains medication compliant. Denies adverse drug reactions.  A: Support and encouragement provided to pt throughout this shift. All medications given as ordered and effects monitored. Safety checks continues at Q 15 minutes intervals without outburst. R: Pt remains safe on and off unit. Tolerates all PO intake well. Denies concerns at this time.

## 2018-08-01 NOTE — Progress Notes (Addendum)
[facility] MD Progress Note  08/01/2018 12:46 PM Lucas Knapp  MRN:  [id number] Subjective:Doing alright today. Im supposed to go to the [place] for my disability and get listed into the draft or select service. Today I need to figure out what is wrong with my brain.    Lucas Knapp is a 33 y/o M with history of schizophrenia who was admitted voluntarily from [facility] where he presented with worsening symptoms of disorganization and psychosis including disorganizaed speech, flight of ideas, and AH. He reported being off of medications for an unclear amount of time. He was medically cleared and then transferred to [facility] for additional treatment and stabilization. He was started on trial of haldol to address symptoms of psychosis and disorganization.  Today upon evaluation, pt shares, "I had another pair of shoes but a man tried them on, so I didn't want them anymore. I got my sketchers from [employer], are they still there? "  At the time of this evaluation patient remains disorganized, tangential, loose association, pressured speech, and intermittent periods of word salad.  He denies other specific concerns today. He is sleeping well. His appetite is good. He denies SI/HI/AH/VH. He is tolerating his medications well.He received his Haldol on Friday, and continues 10mg  of Haldol daily. Despite medication he continues to remain hyperverbal, intrusive, and flight of ideas. He is easily redirected however continues to exhibit signs of mania. Pt was in agreement with this plan. He had no further questions, comments, or concerns.  Principal Problem: Schizophrenia, paranoid type (HCC) Diagnosis:   Patient Active Problem List   Diagnosis Date Noted  . Preventative health care [Z00.00] 09/29/2016  . HTN (hypertension) [I10] 02/18/2016  . Hyperlipidemia [E78.5] 02/18/2016  . Fatigue [R53.83] 02/18/2016  . Low back pain [M54.5] 02/18/2016  . Abdominal pain [R10.9] 11/27/2014  . Schizophrenia, paranoid  type (HCC) [F20.0] 09/17/2012    Class: Acute  . Cannabis abuse [F12.10] 09/17/2012   Total Time spent with patient: 20 minutes  Past Psychiatric History: see H&P for details  Past Medical History:  Past Medical History:  Diagnosis Date  . Back pain   . Bipolar 1 disorder (HCC)   . Hyperlipidemia   . Hypertension    pt has been prescribed HCTZ 12.5 mg daily. Pt was 140/80 on admission.   . Schizophrenia (HCC)    History reviewed. No pertinent surgical history. Family History:  Family History  Problem Relation Age of Onset  . Hypertension Father   . Hyperlipidemia Father   . Hypertension Paternal Grandmother   . Hypertension Paternal Grandfather   . Hypertension Brother        identical twin  . Psychosis Brother        "mental issues"     Family Psychiatric  History: see H&P Social History:  Social History   Substance and Sexual Activity  Alcohol Use Yes  . Alcohol/week: 0.0 standard drinks   Comment: occasionally --"I need to drink more"     Social History   Substance and Sexual Activity  Drug Use Yes  . Types: Marijuana   Comment: Pt endorsed used of marijuana    Social History   Socioeconomic History  . Marital status: Single    Spouse name: Not on file  . Number of children: Not on file  . Years of education: Not on file  . Highest education level: Not on file  Occupational History  . Not on file  Social Needs  . Financial resource strain: Not  on file  . Food insecurity:    Worry: Not on file    Inability: Not on file  . Transportation needs:    Medical: Not on file    Non-medical: Not on file  Tobacco Use  . Smoking status: Current Every Day Smoker    Types: Cigarettes  . Smokeless tobacco: Never Used  . Tobacco comment: 10-15  Substance and Sexual Activity  . Alcohol use: Yes    Alcohol/week: 0.0 standard drinks    Comment: occasionally --"I need to drink more"  . Drug use: Yes    Types: Marijuana    Comment: Pt endorsed used of  marijuana  . Sexual activity: Not on file  Lifestyle  . Physical activity:    Days per week: Not on file    Minutes per session: Not on file  . Stress: Not on file  Relationships  . Social connections:    Talks on phone: Not on file    Gets together: Not on file    Attends religious service: Not on file    Active member of club or organization: Not on file    Attends meetings of clubs or organizations: Not on file    Relationship status: Not on file  Other Topics Concern  . 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    Single   No children   Completed college (bachelors in Statistician)   Enjoys swimming, playing pool, walking   Grew up [place].  Has identical twin and 2 other brothers in [place]   Additional Social History:                         Sleep: Good  Appetite:  Good  Current Medications: Current Facility-Administered Medications  Medication Dose Route Frequency Provider Last Rate Last Dose  . acetaminophen (TYLENOL) tablet 650 mg  650 mg Oral Q6H PRN Charm Rings, NP   650 mg at 07/31/18 1300  . alum & mag hydroxide-simeth (MAALOX/MYLANTA) 200-200-20 MG/5ML suspension 30 mL  30 mL Oral Q4H PRN Charm Rings, NP      . benztropine (COGENTIN) tablet 0.5 mg  0.5 mg Oral Daily Charm Rings, NP   0.5 mg at 08/01/18 0731  . diphenhydrAMINE (BENADRYL) injection 50 mg  50 mg Intramuscular Once PRN Charm Rings, NP      . haloperidol (HALDOL) tablet 5 mg  5 mg Oral BID Charm Rings, NP   5 mg at 08/01/18 0731  . haloperidol (HALDOL) tablet 5 mg  5 mg Oral Q8H PRN Micheal Likens, MD   5 mg at 07/30/18 2108   Or  . haloperidol lactate (HALDOL) injection 5 mg  5 mg Intramuscular Q8H PRN Micheal Likens, MD      . haloperidol decanoate (HALDOL DECANOATE) 100 MG/ML injection 100 mg  100 mg Intramuscular Q28 days Jolyne Loa T, MD   100 mg at 07/30/18 1719  . hydrochlorothiazide (MICROZIDE) capsule 12.5 mg  12.5 mg Oral Daily Jolyne Loa T, MD   12.5 mg at 08/01/18 0731  . hydrOXYzine (ATARAX/VISTARIL) tablet 25 mg  25 mg Oral TID PRN Laveda Abbe, NP   25 mg at 07/31/18 1301  . magnesium hydroxide (MILK OF MAGNESIA) suspension 30 mL  30 mL Oral Daily PRN Charm Rings,  NP      . nicotine (NICODERM CQ - dosed in mg/24 hours) patch 21 mg  21 mg Transdermal Daily PRN Micheal Likens, MD   21 mg at 08/01/18 1011  . pantoprazole (PROTONIX) EC tablet 80 mg  80 mg Oral Daily Charm Rings, NP   80 mg at 08/01/18 0731  . pravastatin (PRAVACHOL) tablet 20 mg  20 mg Oral q1800 Charm Rings, NP   20 mg at 07/31/18 1848  . traZODone (DESYREL) tablet 100 mg  100 mg Oral QHS,MR X 1 Kerry Hough, PA-C   100 mg at 07/31/18 0231    Lab Results:  No results found for this or any previous visit (from the past 48 hour(s)).  Blood Alcohol level:  Lab Results  Component Value Date   ETH <10 07/27/2018   ETH <10 06/28/2018    Metabolic Disorder Labs: Lab Results  Component Value Date   HGBA1C 5.1 07/29/2018   MPG 99.67 07/29/2018   Lab Results  Component Value Date   PROLACTIN 30.0 (H) 07/29/2018   Lab Results  Component Value Date   CHOL 249 (H) 03/29/2018   TRIG 239.0 (H) 03/29/2018   HDL 34.70 (L) 03/29/2018   CHOLHDL 7 03/29/2018   VLDL 47.8 (H) 03/29/2018   LDLCALC 150 (H) 09/29/2016    Physical Findings: AIMS: Facial and Oral Movements Muscles of Facial Expression: None, normal Lips and Perioral Area: None, normal Jaw: None, normal Tongue: None, normal,Extremity Movements Upper (arms, wrists, hands, fingers): None, normal Lower (legs, knees, ankles, toes): None, normal, Trunk Movements Neck, shoulders, hips: None, normal, Overall Severity Severity of abnormal movements (highest score from questions above): None, normal Incapacitation due to abnormal  movements: None, normal Patient's awareness of abnormal movements (rate only patient's report): No Awareness, Dental Status Current problems with teeth and/or dentures?: No Does patient usually wear dentures?: No  CIWA:    COWS:     Musculoskeletal: Strength & Muscle Tone: within normal limits Gait & Station: normal Patient leans: N/A  Psychiatric Specialty Exam: Physical Exam  Nursing note and vitals reviewed.   Review of Systems  Constitutional: Negative for chills and fever.  Respiratory: Negative for cough and shortness of breath.   Cardiovascular: Negative for chest pain.  Gastrointestinal: Negative for abdominal pain, heartburn, nausea and vomiting.  Psychiatric/Behavioral: Negative for depression, hallucinations and suicidal ideas. The patient is not nervous/anxious and does not have insomnia.     Blood pressure (!) 138/99, pulse 83, temperature 99.1 F (37.3 C), resp. rate 18, height 5\' 4"  (1.626 m), weight 101.3 kg, SpO2 99 %.Body mass index is 38.35 kg/m.  General Appearance: Casual and Fairly Groomed  Eye Contact:  Good  Speech:  Clear and Coherent and Pressured  Volume:  Normal  Mood:  Euphoric  Affect:  Appropriate, Blunt and Congruent  Thought Process:  Coherent, Disorganized and Descriptions of Associations: Tangential loose at times.   Orientation:  Full (Time, Place, and Person)  Thought Content:  Ideas of Reference:   Paranoia and Tangential  Suicidal Thoughts:  No  Homicidal Thoughts:  No  Memory:  Immediate;   Fair Recent;   Fair Remote;   Fair  Judgement:  Poor  Insight:  Lacking  Psychomotor Activity:  Normal  Concentration:  Concentration: Fair  Recall:  [employer] of Knowledge:  Fair  Language:  Fair  Akathisia:  No  Handed:    AIMS (if indicated):     Assets:  Resilience Social Support  ADL's:  Intact  Cognition:  WNL  Sleep:  Number of Hours: 4     Treatment Plan Summary: Daily contact with patient to assess and evaluate symptoms  and progress in treatment and Medication management   -Continue inpatient hospitalization  -Schizophrenia, paranoid type             -Continue haldol 5mg  po BID   -Continue haldol decanoate 100mg  IM q28 (administer today 07/30/18)  -HTN              -Continue HCTZ 12.5mg  po qDay  -anxiety             -Continue vistaril 25mg  po TID prn anxiety  -psychosis/agitation             -Continue haldol 5mg  po/IM q8h prn agitation/psychosis  -EPS             -Continue cogentin 0.5mg  po qDay  -GERD             -Continue protonix 80mg  po qDay  -HLD             -Continue pravastatin 20mg  po qDay  -insomnia             -Continue trazodone 100mg  po qhs prn insomnia (may repeat x1)  -Encourage participation in groups and therapeutic milieu  -disposition planning may be ongoing    Lucas Haywardakia S Starkes, FNP 08/01/2018, 12:46 PM   .Agree with NP Progress Note

## 2018-08-01 NOTE — Progress Notes (Signed)
Pt on unit with family and twin brother.  Pt is friendly, calm and cooperative and is med compliant.  Pt denies SI, HI and AVH and verbally contracts for safety.  Pt attends group and participates. Pt denies pain or discomfort. Pt gets snack, medications and goes to bed. Pt remains safe on unit.

## 2018-08-01 NOTE — [facility] Group Notes (Signed)
[facility]  Date/Time:  08/01/2018  11:00AM-12:00PM  Type of Therapy and Topic:  Group Therapy:  Music and Mood  Participation Level:  Active   Description of Group: In this process group, members listened to a variety of genres of music and identified that different types of music evoke different responses.  Patients were encouraged to identify music that was soothing for them and music that was energizing for them.  Patients discussed how this knowledge can help with wellness and recovery in various ways including managing depression and anxiety as well as encouraging healthy sleep habits.    Therapeutic Goals: 1. Patients will explore the impact of different varieties of music on mood 2. Patients will verbalize the thoughts they have when listening to different types of music 3. Patients will identify music that is soothing to them as well as music that is energizing to them 4. Patients will discuss how to use this knowledge to assist in maintaining wellness and recovery 5. Patients will explore the use of music as a coping skill  Summary of Patient Progress:  At the beginning of group, patient expressed that he felt like he needed to be in the hospital and outside too.  He interacted well throughout group, mostly stood, danced, or paced.  He answered questions appropriately.  At the end of group when asked how he now felt, he talked about needing acupuncture needles that were in his abdomen to be removed.  Therapeutic Modalities: Solution Focused Brief Therapy Activity   Lucas MantleMareida Grossman-Orr, LCSW

## 2018-08-01 NOTE — [facility] Group Notes (Signed)
[facility] Group Notes:  (Nursing/MHT/Case Management/Adjunct)  Date:  08/01/2018  Time:  9:52 AM  Type of Therapy:  Nurse Education  Participation Level:  Minimal  Participation Quality:  Redirectable  Affect:  Labile  Cognitive:  Disorganized  Insight:  Lacking  Engagement in Group:  Off Topic  Modes of Intervention:  Activity  Summary of Progress/Problems: This group consisted of learning about healthy support systems. We discussed about different community support resources, including the ACTT team, a therapist, a friend, a family member, etc. We also covered healthy communication skills. The patient was off-topic and his speech was disorganized and non-linear. He was difficult to follow. He did ask a few appropriate questions about the ACTT team.  Kirstie MirzaJonathan C Marcela Alatorre 08/01/2018, 9:52 AM

## 2018-08-01 NOTE — [facility] Group Notes (Signed)
[facility] Group Notes:  (Nursing/MHT/Case Management/Adjunct)  Date:  08/01/2018  Time:  5:25 PM  Type of Therapy:  Nurse Education  Participation Level:  Minimal  Participation Quality:  Inattentive  Affect:  Excited  Cognitive:  Disorganized  Insight:  Lacking  Engagement in Group:  Off Topic and Resistant  Modes of Intervention:  Discussion and Education  Summary of Progress/Problems: In this group, we discussed communication "Do's and Don'ts." We discussed appropriate body language, tone of voice, and word choices. Also, we had the patients take a love language test, and discussed the results. The patient had minimal participation in the group therapy session. He was having significant sidebar conversation that was distracting.  Kirstie MirzaJonathan C Mazikeen Hehn 08/01/2018, 5:25 PM

## 2018-08-01 NOTE — Progress Notes (Signed)
   08/01/18 0500  Sleep  Number of Hours 4

## 2018-08-02 MED ORDER — HALOPERIDOL DECANOATE 100 MG/ML IM SOLN
100.0000 mg | Freq: Once | INTRAMUSCULAR | Status: AC
  Administered 2018-08-02: 100 mg via INTRAMUSCULAR
  Filled 2018-08-02: qty 1

## 2018-08-02 NOTE — [facility] Group Notes (Signed)
LCSW Group Therapy Notes 08/02/2018 1:15pm Type of Therapy and Topic:  Group Therapy:  Communication Participation Level:  Active  Description of Group: Patients will identify how individuals communicate with one another appropriately and inappropriately.  Patients will be guided to discuss their thoughts, feelings and behaviors related to barriers when communicating.  The group will process together ways to execute positive and appropriate communication with attention given to how one uses behavior, tone and body language.  Patients will be encouraged to reflect on a situation where they were successfully able to communicate and what made this example successful.  Group will identify specific changes they are motivated to make in order to overcome communication barriers with self, peers, authority, and parents.  This group will be process-oriented with patients participating in exploration of their own experiences, giving and receiving support, and challenging self and other group members.   Therapeutic Goals 1. Patient will identify how people communicate (body language, facial expression, and electronics).  Group will also discuss tone, voice and how these impact what is communicated and what is received. 2. Patient will identify feelings (such as fear or worry), thought process and behaviors related to why people internalize feelings rather than express self openly. 3. Patient will identify two changes they are willing to make to overcome communication barriers 4. Members will then practice through role play how to communicate using I statements, I feel statements, and acknowledging feelings rather than displacing feelings on others Summary of Patient Progress:  Stayed the entire time, engaged throughout.  Made others laught multiple times with his off the cuff remarks.  Required redirection at times as he continues to be somewhat tangential with loose associations.  Tolerated this well.  Able to  laugh at self.  Thought process is more linear than last week.   Therapeutic Modalities Cognitive Behavioral Therapy Motivational Interviewing Solution Focused Therapy  Ida RogueRodney B Nilza Eaker, KentuckyLCS[place] 08/02/2018 4:12 PM

## 2018-08-02 NOTE — Progress Notes (Signed)
Did not attend group 

## 2018-08-02 NOTE — Progress Notes (Signed)
[facility]  08/02/2018 3:29 PM Lucas Siaerrance Mariotti  MRN:  [id number] Subjective:    Lucas Knapp is a 32 y/o M with history of schizophrenia who was admitted voluntarily from [facility] where he presented with worsening symptoms of disorganization and psychosis including disorganizaed speech, flight of ideas, and AH. He reported being off of medications for an unclear amount of time. He was medically cleared and then [facility] and [facility].He was started on trial of haldol to address symptoms of psychosis and disorganization, and he was transitioned to long-acting injectable form. He has been demonstrating incremental improvement during his hospitalization.  Today upon evaluation, pt shares, "Things are going fine." Pt is calmer than previous encounters and his speech is no longer pressured. He is mildly circumstantial, but he generally linear and cooperative with the interview. He denies any specific concerns today. He is sleeping well. His appetite is good He denies SI/HI/AH/VH. He is tolerating his medications well. Discussed with patient about providing an additional injection of haldol today as starting dose of haldol decanoate is 10-20x the oral dose, and pt was in agreement. He will continue his other medications without changes. He had no further questions, comments, or concerns.  Principal Problem: Schizophrenia, paranoid type (HCC) Diagnosis:   Patient Active Problem List   Diagnosis Date Noted  . Preventative health care [Z00.00] 09/29/2016  . HTN (hypertension) [I10] 02/18/2016  . Hyperlipidemia [E78.5] 02/18/2016  . Fatigue [R53.83] 02/18/2016  . Low back pain [M54.5] 02/18/2016  . Abdominal pain [R10.9] 11/27/2014  . Schizophrenia, paranoid type (HCC) [F20.0] 09/17/2012    Class: Acute  . Cannabis abuse [F12.10] 09/17/2012   Total Time spent with patient: 30 minutes  Past Psychiatric History: see H&P for details  Past Medical  History:  Past Medical History:  Diagnosis Date  . Back pain   . Bipolar 1 disorder (HCC)   . Hyperlipidemia   . Hypertension    pt has been prescribed HCTZ 12.5 mg daily. Pt was 140/80 on admission.   . Schizophrenia (HCC)    History reviewed. No pertinent surgical history. Family History:  Family History  Problem Relation Age of Onset  . Hypertension Father   . Hyperlipidemia Father   . Hypertension Paternal Grandmother   . Hypertension Paternal Grandfather   . Hypertension Brother        identical twin  . Psychosis Brother        "mental issues"     Family Psychiatric  History: see H&P for details Social History:  Social History   Substance and Sexual Activity  Alcohol Use Yes  . Alcohol/week: 0.0 standard drinks   Comment: occasionally --"I need to drink more"     Social History   Substance and Sexual Activity  Drug Use Yes  . Types: Marijuana   Comment: Pt endorsed used of marijuana    Social History   Socioeconomic History  . Marital status: Single    Spouse name: Not on file  . Number of children: Not on file  . Years of education: Not on file  . Highest education level: Not on file  Occupational History  . Not on file  Social Needs  . Financial resource strain: Not on file  . Food insecurity:    Worry: Not on file    Inability: Not on file  . Transportation needs:    Medical: Not on file    Non-medical: Not on file  Tobacco Use  . Smoking status:  Current Every Day Smoker    Types: Cigarettes  . Smokeless tobacco: Never Used  . Tobacco comment: 10-15  Substance and Sexual Activity  . Alcohol use: Yes    Alcohol/week: 0.0 standard drinks    Comment: occasionally --"I need to drink more"  . Drug use: Yes    Types: Marijuana    Comment: Pt endorsed used of marijuana  . Sexual activity: Not on file  Lifestyle  . Physical activity:    Days per week: Not on file    Minutes per session: Not on file  . Stress: Not on file  Relationships  .  Social connections:    Talks on phone: Not on file    Gets together: Not on file    Attends religious service: Not on file    Active member of club or organization: Not on file    Attends meetings of clubs or organizations: Not on file    Relationship status: Not on file  Other Topics Concern  . 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    Single   No children   Completed college (bachelors in Statistician)   Enjoys swimming, playing pool, walking   Grew up [place].  Has identical twin and 2 other brothers in [place]   Additional Social History:                         Sleep: Good  Appetite:  Good  Current Medications: Current Facility-Administered Medications  Medication Dose Route Frequency Provider Last Rate Last Dose  . acetaminophen (TYLENOL) tablet 650 mg  650 mg Oral Q6H PRN Charm Rings, NP   650 mg at 07/31/18 1300  . alum & mag hydroxide-simeth (MAALOX/MYLANTA) 200-200-20 MG/5ML suspension 30 mL  30 mL Oral Q4H PRN Charm Rings, NP      . benztropine (COGENTIN) tablet 0.5 mg  0.5 mg Oral Daily Charm Rings, NP   0.5 mg at 08/02/18 0826  . diphenhydrAMINE (BENADRYL) injection 50 mg  50 mg Intramuscular Once PRN Charm Rings, NP      . haloperidol (HALDOL) tablet 5 mg  5 mg Oral BID Charm Rings, NP   5 mg at 08/02/18 1610  . haloperidol (HALDOL) tablet 5 mg  5 mg Oral Q8H PRN Micheal Likens, MD   5 mg at 07/30/18 2108   Or  . haloperidol lactate (HALDOL) injection 5 mg  5 mg Intramuscular Q8H PRN Micheal Likens, MD      . haloperidol decanoate (HALDOL DECANOATE) 100 MG/ML injection 100 mg  100 mg Intramuscular Q28 days Jolyne Loa T, MD   100 mg at 07/30/18 1719  . hydrochlorothiazide (MICROZIDE) capsule 12.5 mg  12.5 mg Oral Daily Jolyne Loa T, MD   12.5 mg at 08/02/18 9604  . hydrOXYzine  (ATARAX/VISTARIL) tablet 25 mg  25 mg Oral TID PRN Laveda Abbe, NP   25 mg at 07/31/18 1301  . magnesium hydroxide (MILK OF MAGNESIA) suspension 30 mL  30 mL Oral Daily PRN Charm Rings, NP      . nicotine (NICODERM CQ - dosed in mg/24 hours) patch 21 mg  21 mg Transdermal Daily PRN Micheal Likens, MD   21 mg at 08/02/18 0958  . pantoprazole (PROTONIX) EC tablet 80 mg  80  mg Oral Daily Charm Rings, NP   80 mg at 08/02/18 4098  . pravastatin (PRAVACHOL) tablet 20 mg  20 mg Oral q1800 Charm Rings, NP   20 mg at 08/01/18 1701  . traZODone (DESYREL) tablet 100 mg  100 mg Oral QHS,MR X 1 Kerry Hough, PA-C   100 mg at 07/31/18 0231    Lab Results: No results found for this or any previous visit (from the past 48 hour(s)).  Blood Alcohol level:  Lab Results  Component Value Date   ETH <10 07/27/2018   ETH <10 06/28/2018    Metabolic Disorder Labs: Lab Results  Component Value Date   HGBA1C 5.1 07/29/2018   MPG 99.67 07/29/2018   Lab Results  Component Value Date   PROLACTIN 30.0 (H) 07/29/2018   Lab Results  Component Value Date   CHOL 249 (H) 03/29/2018   TRIG 239.0 (H) 03/29/2018   HDL 34.70 (L) 03/29/2018   CHOLHDL 7 03/29/2018   VLDL 47.8 (H) 03/29/2018   LDLCALC 150 (H) 09/29/2016    Physical Findings: AIMS: Facial and Oral Movements Muscles of Facial Expression: None, normal Lips and Perioral Area: None, normal Jaw: None, normal Tongue: None, normal,Extremity Movements Upper (arms, wrists, hands, fingers): None, normal Lower (legs, knees, ankles, toes): None, normal, Trunk Movements Neck, shoulders, hips: None, normal, Overall Severity Severity of abnormal movements (highest score from questions above): None, normal Incapacitation due to abnormal movements: None, normal Patient's awareness of abnormal movements (rate only patient's report): No Awareness, Dental Status Current problems with teeth and/or dentures?: No Does patient  usually wear dentures?: No  CIWA:    COWS:     Musculoskeletal: Strength & Muscle Tone: within normal limits Gait & Station: normal Patient leans: N/A  Psychiatric Specialty Exam: Physical Exam  Nursing note and vitals reviewed.   Review of Systems  Constitutional: Negative for chills and fever.  Respiratory: Negative for cough and shortness of breath.   Cardiovascular: Negative for chest pain.  Gastrointestinal: Negative for abdominal pain, heartburn, nausea and vomiting.  Psychiatric/Behavioral: Negative for depression, hallucinations and suicidal ideas. The patient is not nervous/anxious and does not have insomnia.     Blood pressure (!) 139/93, pulse 90, temperature 98.7 F (37.1 C), resp. rate 20, height 5\' 4"  (1.626 m), weight 101.3 kg, SpO2 99 %.Body mass index is 38.35 kg/m.  General Appearance: Casual and Fairly Groomed  Eye Contact:  Good  Speech:  Clear and Coherent and Normal Rate  Volume:  Normal  Mood:  Euthymic  Affect:  Appropriate, Blunt and Congruent  Thought Process:  Coherent, Goal Directed and Descriptions of Associations: Circumstantial  Orientation:  Full (Time, Place, and Person)  Thought Content:  Logical  Suicidal Thoughts:  No  Homicidal Thoughts:  No  Memory:  Immediate;   Fair Recent;   Fair Remote;   Fair  Judgement:  Intact  Insight:  Fair  Psychomotor Activity:  Normal  Concentration:  Concentration: Fair  Recall:  [employer] of Knowledge:  Fair  Language:  Fair  Akathisia:  No  Handed:    AIMS (if indicated):     Assets:  Resilience Social Support  ADL's:  Intact  Cognition:  WNL  Sleep:  Number of Hours: 5.25   Treatment Plan Summary: Daily contact with patient to assess and evaluate symptoms and progress in treatment and Medication management   -Continue inpatient hospitalization  -Schizophrenia, paranoid type -Continue haldol 5mg  po BID             -  Continue haldol decanoate 100mg  IM q28 (administered on  07/30/18, and we will administer additional 100mg  IM once today)  -HTN -Continue HCTZ 12.5mg  po qDay  -anxiety -Continue vistaril 25mg  po TID prn anxiety  -psychosis/agitation -Continue haldol 5mg  po/IM q8h prn agitation/psychosis  -EPS -Continue cogentin 0.5mg  po qDay  -GERD -Continue protonix 80mg  po qDay  -HLD -Continue pravastatin 20mg  po qDay  -insomnia -Continue trazodone 100mg  po qhs prn insomnia (may repeat x1)  -Encourage participation in groups and therapeutic milieu  -disposition planning may be ongoing  Micheal Likenshristopher T Mercedes Valeriano, MD 08/02/2018, 3:29 PM

## 2018-08-02 NOTE — Plan of Care (Signed)
  Problem: Self-Care: Goal: Ability to participate in self-care as condition permits will improve Outcome: Progressing-He is able to perform ADL's without assistance.  He goes to meals and is able to notify staff of his needs.

## 2018-08-02 NOTE — Progress Notes (Signed)
D:  Lucas Knapp has been in his room all evening.  He was out and visible in the hallway at beginning of shift but quickly went to bed.  He didn't attend evening wrap up group.  He was asked to come and take his hs medications but declined because he believes that the trazodone is causing him to "wake up".  He ate a snack at that time and immediately went back to bed.  He denied any SI/HI or A/V hallucinations.  He remains tangential and difficult to follow during interaction but is pleasant and cooperative.  He is currently resting with his eyes closed and appeared to be asleep. A:  1:1 with RN for support and encouragement.  Medications as ordered.  Q 15 minute checks maintained for safety.  Encouraged participation in group and unit activities.   R:  Devone remains safe on the unit.  We will continue to monitor the progress towards his goals.

## 2018-08-02 NOTE — Plan of Care (Signed)
  Problem: Activity: Goal: Interest or engagement in activities will improve Outcome: Progressing   Problem: Safety: Goal: Periods of time without injury will increase Outcome: Progressing   Problem: Health Behavior/Discharge Planning: Goal: Compliance with prescribed medication regimen will improve Outcome: Progressing  DAR NOTE: Patient presents with anxious affect and mood.  Denies suicidal thoughts, pain, auditory and visual hallucinations.  Described energy level as normal and concentration as good.  Rates depression at 0, hopelessness at 0, and anxiety at 0.  Maintained on routine safety checks.  Medications given as prescribed.  Haldol 100 mg injection given.  No adverse reaction noted.  Support and encouragement offered as needed.  Attended group and participated.  Patient observed socializing with peers in the dayroom.  Offered no complaint.

## 2018-08-02 NOTE — Tx Team (Signed)
Interdisciplinary Treatment and Diagnostic Plan Update  08/02/2018 Time of Session: 9:03 AM  Lucas Knapp MRN: [id number]  Principal Diagnosis: Schizophrenia, paranoid type ([place])  Secondary Diagnoses: Principal Problem:   Schizophrenia, paranoid type ([place])   Current Medications:  Current Facility-Administered Medications  Medication Dose Route Frequency Provider Last Rate Last Dose  . acetaminophen (TYLENOL) tablet 650 mg  650 mg Oral Q6H PRN Patrecia Pour, NP   650 mg at 07/31/18 1300  . alum & mag hydroxide-simeth (MAALOX/MYLANTA) 200-200-20 MG/5ML suspension 30 mL  30 mL Oral Q4H PRN Patrecia Pour, NP      . benztropine (COGENTIN) tablet 0.5 mg  0.5 mg Oral Daily Patrecia Pour, NP   0.5 mg at 08/02/18 0826  . diphenhydrAMINE (BENADRYL) injection 50 mg  50 mg Intramuscular Once PRN Patrecia Pour, NP      . haloperidol (HALDOL) tablet 5 mg  5 mg Oral BID Patrecia Pour, NP   5 mg at 08/02/18 4128  . haloperidol (HALDOL) tablet 5 mg  5 mg Oral Q8H PRN Pennelope Bracken, MD   5 mg at 07/30/18 2108   Or  . haloperidol lactate (HALDOL) injection 5 mg  5 mg Intramuscular Q8H PRN Pennelope Bracken, MD      . haloperidol decanoate (HALDOL DECANOATE) 100 MG/ML injection 100 mg  100 mg Intramuscular Q28 days Maris Berger T, MD   100 mg at 07/30/18 1719  . hydrochlorothiazide (MICROZIDE) capsule 12.5 mg  12.5 mg Oral Daily Maris Berger T, MD   12.5 mg at 08/02/18 7867  . hydrOXYzine (ATARAX/VISTARIL) tablet 25 mg  25 mg Oral TID PRN Ethelene Hal, NP   25 mg at 07/31/18 1301  . magnesium hydroxide (MILK OF MAGNESIA) suspension 30 mL  30 mL Oral Daily PRN Patrecia Pour, NP      . nicotine (NICODERM CQ - dosed in mg/24 hours) patch 21 mg  21 mg Transdermal Daily PRN Pennelope Bracken, MD   21 mg at 08/01/18 1011  . pantoprazole (PROTONIX) EC tablet 80 mg  80 mg Oral Daily Patrecia Pour, NP   80 mg at 08/02/18 0826  . pravastatin  (PRAVACHOL) tablet 20 mg  20 mg Oral q1800 Patrecia Pour, NP   20 mg at 08/01/18 1701  . traZODone (DESYREL) tablet 100 mg  100 mg Oral QHS,MR X 1 Laverle Hobby, PA-C   100 mg at 07/31/18 0231    PTA Medications: Medications Prior to Admission  Medication Sig Dispense Refill Last Dose  . betamethasone dipropionate (DIPROLENE) 0.05 % cream Apply topically 2 (two) times daily. 30 g 0 unk  . diphenhydrAMINE (BENADRYL) 25 MG tablet Take 25 mg by mouth 2 (two) times daily as needed for itching or allergies.   unk  . haloperidol (HALDOL) 5 MG tablet    unk  . lovastatin (MEVACOR) 20 MG tablet Take 1 tablet (20 mg total) by mouth at bedtime. 90 tablet 1 unk  . meloxicam (MOBIC) 7.5 MG tablet Take 1 tablet (7.5 mg total) by mouth daily. 14 tablet 0 unk  . omeprazole (PRILOSEC) 40 MG capsule Take 1 capsule (40 mg total) by mouth daily. 30 capsule 3 unk    Patient Stressors: Educational concerns Financial difficulties Health problems  Patient Strengths: Motivation for treatment/growth  Treatment Modalities: Medication Management, Group therapy, Case management,  1 to 1 session with clinician, Psychoeducation, Recreational therapy.   Physician Treatment Plan for Primary Diagnosis: Schizophrenia, paranoid type (  [place]) Long Term Goal(s): Improvement in symptoms so as ready for discharge  Short Term Goals: Ability to maintain clinical measurements within normal limits will improve Ability to identify and develop effective coping behaviors will improve  Medication Management: Evaluate patient's response, side effects, and tolerance of medication regimen.  Therapeutic Interventions: 1 to 1 sessions, Unit Group sessions and Medication administration.  Evaluation of Outcomes: Progressing   8/12:  "Things are going fine." Pt is calmer than previous encounters and his speech is no longer pressured. He is mildly circumstantial, but he generally linear and cooperative with the interview. He denies  any specific concerns today. He is sleeping well. His appetite is good He denies SI/HI/AH/VH. He is tolerating his medications well. Discussed with patient about providing an additional injection of haldol today as starting dose of haldol decanoate is 10-20x the oral dose, and pt was in agreement. He will continue his other medications without changes  Physician Treatment Plan for Secondary Diagnosis: Principal Problem:   Schizophrenia, paranoid type ([place])   Long Term Goal(s): Improvement in symptoms so as ready for discharge  Short Term Goals: Ability to maintain clinical measurements within normal limits will improve Ability to identify and develop effective coping behaviors will improve  Medication Management: Evaluate patient's response, side effects, and tolerance of medication regimen.  Therapeutic Interventions: 1 to 1 sessions, Unit Group sessions and Medication administration.  Evaluation of Outcomes: Progressing   RN Treatment Plan for Primary Diagnosis: Schizophrenia, paranoid type ([place]) Long Term Goal(s): Knowledge of disease and therapeutic regimen to maintain health will improve  Short Term Goals: Ability to identify and develop effective coping behaviors will improve and Compliance with prescribed medications will improve  Medication Management: RN will administer medications as ordered by provider, will assess and evaluate patient's response and provide education to patient for prescribed medication. RN will report any adverse and/or side effects to prescribing provider.  Therapeutic Interventions: 1 on 1 counseling sessions, Psychoeducation, Medication administration, Evaluate responses to treatment, Monitor vital signs and CBGs as ordered, Perform/monitor CIWA, COWS, AIMS and Fall Risk screenings as ordered, Perform wound care treatments as ordered.  Evaluation of Outcomes: Progressing   LCSW Treatment Plan for Primary Diagnosis: Schizophrenia, paranoid type  ([place]) Long Term Goal(s): Safe transition to appropriate next level of care at discharge, Engage patient in therapeutic group addressing interpersonal concerns.  Short Term Goals: Engage patient in aftercare planning with referrals and resources  Therapeutic Interventions: Assess for all discharge needs, 1 to 1 time with Social worker, Explore available resources and support systems, Assess for adequacy in community support network, Educate family and significant other(s) on suicide prevention, Complete Psychosocial Assessment, Interpersonal group therapy.  Evaluation of Outcomes: Met  Return home, follow up outpt provider [TBD]   Progress in Treatment: Attending groups: Yes Participating in groups: Yes Taking medication as prescribed: Yes Toleration medication: Yes, no side effects reported at this time Family/Significant other contact made: No Patient understands diagnosis: No Limited insight Discussing patient identified problems/goals with staff: Yes Medical problems stabilized or resolved: Yes Denies suicidal/homicidal ideation: Yes Issues/concerns per patient self-inventory: None Other: N/A  New problem(s) identified: None identified at this time.   New Short Term/Long Term Goal(s): "Relax and work on mindfullness.  And be away from my twin brother.  Sometimes I stay with Mr Claiborne Billings.  He gives me a room to stay sometimes."   Discharge Plan or Barriers:   Reason for Continuation of Hospitalization: Disorganization Medication stabilization   Estimated Length of Stay:  8/16  Attendees: Patient:  08/02/2018  9:03 AM  Physician: Maris Berger, MD 08/02/2018  9:03 AM  Nursing: Sena Hitch, RN 08/02/2018  9:03 AM  RN Care Manager: Lars Pinks, RN 08/02/2018  9:03 AM  Social Worker: Ripley Fraise 08/02/2018  9:03 AM  Recreational Therapist: Winfield Cunas 08/02/2018  9:03 AM  Other: Norberto Sorenson 08/02/2018  9:03 AM  Other:  08/02/2018  9:03 AM    Scribe for  Treatment Team:  Roque Lias LCSW 08/02/2018 9:03 AM

## 2018-08-02 NOTE — Progress Notes (Addendum)
Pt was observed resting in bed with eyes closed. Pt was awaken for assessment but appears drowsy at this time. Pt proceeded to go back to sleep. Pt did not attend wrap-up group. Will continue with POC.

## 2018-08-03 MED ORDER — IBUPROFEN 600 MG PO TABS
600.0000 mg | ORAL_TABLET | Freq: Four times a day (QID) | ORAL | Status: DC | PRN
  Administered 2018-08-03: 600 mg via ORAL
  Filled 2018-08-03: qty 1

## 2018-08-03 NOTE — Progress Notes (Signed)
Pt was noted to be resting in bed with eyes closed. Pt was awaken for assessment.Pt was encourage to go in dayroom to program/get snacks & fluids. Pt denies SI/HI/AVH/Pain at this time. Pt appears anxious/irritable in affect and mood.Thought process & speech remains tangential/delusional/with flight of ideas. PRN haldol and vistaril requested and given. Pt refused to take scheduled trazodone. Will continue with POC.

## 2018-08-03 NOTE — [facility] Group Notes (Signed)
LCSW Group Therapy Note   08/03/2018 1:15pm   Type of Therapy and Topic:  Group Therapy:  Overcoming Obstacles   Participation Level:  Active   Description of Group:    In this group patients will be encouraged to explore what they see as obstacles to their own wellness and recovery. They will be guided to discuss their thoughts, feelings, and behaviors related to these obstacles. The group will process together ways to cope with barriers, with attention given to specific choices patients can make. Each patient will be challenged to identify changes they are motivated to make in order to overcome their obstacles. This group will be process-oriented, with patients participating in exploration of their own experiences as well as giving and receiving support and challenge from other group members.   Therapeutic Goals: 1. Patient will identify personal and current obstacles as they relate to admission. 2. Patient will identify barriers that currently interfere with their wellness or overcoming obstacles.  3. Patient will identify feelings, thought process and behaviors related to these barriers. 4. Patient will identify two changes they are willing to make to overcome these obstacles:      Summary of Patient Progress  In and out a couple of times, but present for the most part.  Continues tangential, expansive, requiring redirection. Difficult to follow.    Therapeutic Modalities:   Cognitive Behavioral Therapy Solution Focused Therapy Motivational Interviewing Relapse Prevention Therapy  Ida RogueRodney B Nel Stoneking, LCSW 08/03/2018 1:33 PM

## 2018-08-03 NOTE — Progress Notes (Signed)
Adult Psychoeducational Group Note  Date:  08/03/2018 Time:  10:31 AM  Group Topic/Focus:  Goals Group:   The focus of this group is to help patients establish daily goals to achieve during treatment and discuss how the patient can incorporate goal setting into their daily lives to aide in recovery.  Participation Level:  Active  Participation Quality:  Appropriate  Affect:  Appropriate  Cognitive:  Alert  Insight: Appropriate  Engagement in Group:  Engaged  Modes of Intervention:  Discussion  Additional Comments:  Pt's goal today is to work on finding a new job. Pt was appropriate and engaged in group today.  Lucianne LeiHaley M Edrick Whitehorn 08/03/2018, 10:31 AM

## 2018-08-03 NOTE — Progress Notes (Signed)
Did not attend group 

## 2018-08-03 NOTE — Progress Notes (Signed)
Pt woke up this time pacing back and forth in hallway. Pt states "When is the doctor getting here?; I need to get out". Pt appears agitated/irritable/anxious at this time. PRN haldol and vistaril offered; Pt accepted. Pt was redirected back to room. Will continue to monitor behavior.

## 2018-08-03 NOTE — Progress Notes (Signed)
[facility] MD Progress Note  08/03/2018 12:45 PM Lucas Knapp  MRN:  [id number] Subjective:    Lucas Knapp is a 32 y/o M with history of schizophrenia who was admitted voluntarily from [facility] where he presented with worsening symptoms of disorganization and psychosis including disorganizaed speech, flight of ideas, and AH. He reported being off of medications for an unclear amount of time. He was medically cleared and then transferred to [facility] for additional treatment and stabilization.He was started on trial of haldol to address symptoms of psychosis and disorganization, and he was transitioned to long-acting injectable form and he received two doses of haldol decanoate 100mg  IM. He has been demonstrating incremental improvement during his hospitalization.  Today upon evaluation, pt shares, "I need to see my brother. We need to start talking about a firm discharge date." Pt reports that overall he is doing well. He is sleeping adequately as per his report, but RN staff only recorded 4.5 hours of sleep, as pt had early awakening and was able to return to sleep. Pt is mildly pressured in his speech and somewhat circumstantial at times, but he is generally linear and goal oriented. He denies physical complaints. He is tolerating his medications well, and he denies any side effects. He is in agreement to continue his current regimen without changes today. Pt expresses interest in an ACT team, but he may not be eligible. He expresses that he is willing to follow up on an outpatient basis. We will begin to finalize discharge planning with SW team. He had no further questions, comments, or concerns.  Principal Problem: Schizophrenia, paranoid type (HCC) Diagnosis:   Patient Active Problem List   Diagnosis Date Noted  . Preventative health care [Z00.00] 09/29/2016  . HTN (hypertension) [I10] 02/18/2016  . Hyperlipidemia [E78.5] 02/18/2016  . Fatigue [R53.83] 02/18/2016  . Low back pain [M54.5] 02/18/2016   . Abdominal pain [R10.9] 11/27/2014  . Schizophrenia, paranoid type (HCC) [F20.0] 09/17/2012    Class: Acute  . Cannabis abuse [F12.10] 09/17/2012   Total Time spent with patient: 30 minutes  Past Psychiatric History: see H&P  Past Medical History:  Past Medical History:  Diagnosis Date  . Back pain   . Bipolar 1 disorder (HCC)   . Hyperlipidemia   . Hypertension    pt has been prescribed HCTZ 12.5 mg daily. Pt was 140/80 on admission.   . Schizophrenia (HCC)    History reviewed. No pertinent surgical history. Family History:  Family History  Problem Relation Age of Onset  . Hypertension Father   . Hyperlipidemia Father   . Hypertension Paternal Grandmother   . Hypertension Paternal Grandfather   . Hypertension Brother        identical twin  . Psychosis Brother        "mental issues"     Family Psychiatric  History: see H&P Social History:  Social History   Substance and Sexual Activity  Alcohol Use Yes  . Alcohol/week: 0.0 standard drinks   Comment: occasionally --"I need to drink more"     Social History   Substance and Sexual Activity  Drug Use Yes  . Types: Marijuana   Comment: Pt endorsed used of marijuana    Social History   Socioeconomic History  . Marital status: Single    Spouse name: Not on file  . Number of children: Not on file  . Years of education: Not on file  . Highest education level: Not on file  Occupational History  . Not on  file  Social Needs  . Financial resource strain: Not on file  . Food insecurity:    Worry: Not on file    Inability: Not on file  . Transportation needs:    Medical: Not on file    Non-medical: Not on file  Tobacco Use  . Smoking status: Current Every Day Smoker    Types: Cigarettes  . Smokeless tobacco: Never Used  . Tobacco comment: 10-15  Substance and Sexual Activity  . Alcohol use: Yes    Alcohol/week: 0.0 standard drinks    Comment: occasionally --"I need to drink more"  . Drug use: Yes     Types: Marijuana    Comment: Pt endorsed used of marijuana  . Sexual activity: Not on file  Lifestyle  . Physical activity:    Days per week: Not on file    Minutes per session: Not on file  . Stress: Not on file  Relationships  . Social connections:    Talks on phone: Not on file    Gets together: Not on file    Attends religious service: Not on file    Active member of club or organization: Not on file    Attends meetings of clubs or organizations: Not on file    Relationship status: Not on file  Other Topics Concern  . 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    Single   No children   Completed college (bachelors in Statistician)   Enjoys swimming, playing pool, walking   Grew up [place].  Has identical twin and 2 other brothers in [place]   Additional Social History:                         Sleep: Fair  Appetite:  Good  Current Medications: Current Facility-Administered Medications  Medication Dose Route Frequency Provider Last Rate Last Dose  . acetaminophen (TYLENOL) tablet 650 mg  650 mg Oral Q6H PRN Charm Rings, NP   650 mg at 07/31/18 1300  . alum & mag hydroxide-simeth (MAALOX/MYLANTA) 200-200-20 MG/5ML suspension 30 mL  30 mL Oral Q4H PRN Charm Rings, NP      . benztropine (COGENTIN) tablet 0.5 mg  0.5 mg Oral Daily Charm Rings, NP   0.5 mg at 08/03/18 0749  . diphenhydrAMINE (BENADRYL) injection 50 mg  50 mg Intramuscular Once PRN Charm Rings, NP      . haloperidol (HALDOL) tablet 5 mg  5 mg Oral BID Charm Rings, NP   5 mg at 08/03/18 1041  . haloperidol (HALDOL) tablet 5 mg  5 mg Oral Q8H PRN Micheal Likens, MD   5 mg at 08/03/18 0411   Or  . haloperidol lactate (HALDOL) injection 5 mg  5 mg Intramuscular Q8H PRN Micheal Likens, MD      . haloperidol decanoate (HALDOL DECANOATE) 100 MG/ML injection 100 mg   100 mg Intramuscular Q28 days Jolyne Loa T, MD   100 mg at 07/30/18 1719  . hydrochlorothiazide (MICROZIDE) capsule 12.5 mg  12.5 mg Oral Daily Jolyne Loa T, MD   12.5 mg at 08/03/18 0749  . hydrOXYzine (ATARAX/VISTARIL) tablet 25 mg  25 mg Oral TID PRN Laveda Abbe, NP   25 mg at 08/03/18 0410  . ibuprofen (ADVIL,MOTRIN) tablet 600 mg  600  mg Oral Q6H PRN Micheal Likensainville, Shyia Fillingim T, MD   600 mg at 08/03/18 1039  . magnesium hydroxide (MILK OF MAGNESIA) suspension 30 mL  30 mL Oral Daily PRN Charm RingsLord, Jamison Y, NP      . nicotine (NICODERM CQ - dosed in mg/24 hours) patch 21 mg  21 mg Transdermal Daily PRN Micheal Likensainville, Estevon Fluke T, MD   21 mg at 08/02/18 0958  . pantoprazole (PROTONIX) EC tablet 80 mg  80 mg Oral Daily Charm RingsLord, Jamison Y, NP   80 mg at 08/03/18 0749  . pravastatin (PRAVACHOL) tablet 20 mg  20 mg Oral q1800 Charm RingsLord, Jamison Y, NP   20 mg at 08/02/18 1717  . traZODone (DESYREL) tablet 100 mg  100 mg Oral QHS,MR X 1 Kerry HoughSimon, Spencer E, PA-C   100 mg at 07/31/18 0231    Lab Results: No results found for this or any previous visit (from the past 48 hour(s)).  Blood Alcohol level:  Lab Results  Component Value Date   ETH <10 07/27/2018   ETH <10 06/28/2018    Metabolic Disorder Labs: Lab Results  Component Value Date   HGBA1C 5.1 07/29/2018   MPG 99.67 07/29/2018   Lab Results  Component Value Date   PROLACTIN 30.0 (H) 07/29/2018   Lab Results  Component Value Date   CHOL 249 (H) 03/29/2018   TRIG 239.0 (H) 03/29/2018   HDL 34.70 (L) 03/29/2018   CHOLHDL 7 03/29/2018   VLDL 47.8 (H) 03/29/2018   LDLCALC 150 (H) 09/29/2016    Physical Findings: AIMS: Facial and Oral Movements Muscles of Facial Expression: None, normal Lips and Perioral Area: None, normal Jaw: None, normal Tongue: None, normal,Extremity Movements Upper (arms, wrists, hands, fingers): None, normal Lower (legs, knees, ankles, toes): None, normal, Trunk Movements Neck,  shoulders, hips: None, normal, Overall Severity Severity of abnormal movements (highest score from questions above): None, normal Incapacitation due to abnormal movements: None, normal Patient's awareness of abnormal movements (rate only patient's report): No Awareness, Dental Status Current problems with teeth and/or dentures?: No Does patient usually wear dentures?: No  CIWA:    COWS:     Musculoskeletal: Strength & Muscle Tone: within normal limits Gait & Station: normal Patient leans: N/A  Psychiatric Specialty Exam: Physical Exam  Nursing note and vitals reviewed.   Review of Systems  Constitutional: Negative for chills and fever.  Respiratory: Negative for cough and shortness of breath.   Cardiovascular: Negative for chest pain.  Gastrointestinal: Negative for abdominal pain, heartburn, nausea and vomiting.  Psychiatric/Behavioral: Negative for depression, hallucinations and suicidal ideas. The patient is not nervous/anxious and does not have insomnia.     Blood pressure (!) 156/101, pulse (!) 103, temperature 98.7 F (37.1 C), resp. rate 20, height 5\' 4"  (1.626 m), weight 101.3 kg, SpO2 99 %.Body mass index is 38.35 kg/m.  General Appearance: Casual and Fairly Groomed  Eye Contact:  Good  Speech:  Clear and Coherent and Normal Rate  Volume:  Normal  Mood:  Euthymic  Affect:  Appropriate and Congruent  Thought Process:  Coherent, Goal Directed and Descriptions of Associations: Circumstantial  Orientation:  Full (Time, Place, and Person)  Thought Content:  Logical  Suicidal Thoughts:  No  Homicidal Thoughts:  No  Memory:  Immediate;   Good Recent;   Good Remote;   Good  Judgement:  Good  Insight:  Lacking  Psychomotor Activity:  Normal  Concentration:  Concentration: Fair  Recall:  FiservFair  Fund of Knowledge:  Fair  Language:  Good  Akathisia:  No  Handed:    AIMS (if indicated):     Assets:  Resilience Social Support  ADL's:  Intact  Cognition:  WNL  Sleep:   Number of Hours: 4.5     Treatment Plan Summary: Daily contact with patient to assess and evaluate symptoms and progress in treatment and Medication management   -Continue inpatient hospitalization  -Schizophrenia, paranoid type -Continue haldol 5mg  po BID -Continue haldol decanoate 100mg  IM q28 (100mg  IM administered on 07/30/18 and 08/02/18)  -HTN -Continue HCTZ 12.5mg  po qDay  -anxiety -Continue vistaril 25mg  po TID prn anxiety  -psychosis/agitation -Continue haldol 5mg  po/IM q8h prn agitation/psychosis  -EPS -Continue cogentin 0.5mg  po qDay  -GERD -Continue protonix 80mg  po qDay  -HLD -Continue pravastatin 20mg  po qDay  -insomnia -Continue trazodone 100mg  po qhs prn insomnia (may repeat x1)  -Encourage participation in groups and therapeutic milieu  -disposition planning may be ongoing  Micheal Likenshristopher T Josha Weekley, MD 08/03/2018, 12:45 PM

## 2018-08-03 NOTE — Plan of Care (Signed)
  Problem: Activity: Goal: Interest or engagement in activities will improve Outcome: Progressing   Problem: Physical Regulation: Goal: Ability to maintain clinical measurements within normal limits will improve Outcome: Progressing   Problem: Safety: Goal: Periods of time without injury will increase Outcome: Progressing  DAR NOTE: Patient presents with anxious affect and depressed mood.  Patient continues to have pressured speech with tangential thought process.  Denies suicidal thoughts, auditory and visual hallucinations.  Described energy level as low and concentration as poor.  Rates depression at 0, hopelessness at 0, and anxiety at 0.  Maintained on routine safety checks.  Medications given as prescribed.  Support and encouragement offered as needed.  Attended group and participated.  States goal for today is "discharge."  Patient observed socializing with peers in the dayroom.  Motrin 600 mg given for complain of lower back pain with good effect.

## 2018-08-04 MED ORDER — ONDANSETRON HCL 4 MG/2ML IJ SOLN
4.0000 mg | Freq: Once | INTRAMUSCULAR | Status: AC
  Administered 2018-08-04: 4 mg via INTRAMUSCULAR
  Filled 2018-08-04 (×2): qty 2

## 2018-08-04 NOTE — Therapy (Signed)
Occupational Therapy Group Note  Date:  08/04/2018 Time:  3:38 PM  Group Topic/Focus:  Stress Management  Participation Level:  Active  Participation Quality:  Redirectable  Affect:  Flat  Cognitive:  Appropriate  Insight: Limited  Engagement in Group:  Engaged  Modes of Intervention:  Activity, Discussion, Education and Socialization  Additional Comments:    S: "I like swimming and playing pool"  O: Education given on stress management and healthy coping mechanisms. Pt encouraged to brainstorm with other peers and discuss what has worked in the past vs what has not. Pts further encouraged to discuss new coping stress management strategies to implement this date. Art activity made to display preferred coping mechanisms, along with incorporating the stress management outlet of coloring/art.  ?  A: Pt presents to group with blunted affect, less disorganized than previous session. Pt engaged in art activitiy with max 1 step VC's, which was successful. Pt identified several positive coping mechanisms with some redirection when becoming mildly disorganized. ?  P: Pt provided with education on stress management activities to implement into daily routine. Handouts given to facilitate carryover when reintegrating into communit    [facility] Memorial Medical CenterKaylee Traylon Schimming, MSOT, OTR/L  AvnetKaylee Sebastien Jackson 08/04/2018, 3:38 PM

## 2018-08-04 NOTE — Progress Notes (Signed)
Patient denies SI, HI and AVH.  Patient has been engaged in groups, compliant with medications and has had no incidents of behavioral dsycontrol this shift.   Assess patient for safety, offer medications as prescribed, and engage patient in 1:1 staff talk.   Patient able to contract for safety.  Continue to monitor as planned. 

## 2018-08-04 NOTE — Progress Notes (Signed)
Pt was noted to be resting in bed with eyes closed. Pt was awaken for assessment.Pt was encourage to go in dayroom to program/get snacks & fluids. Pt denies SI/HI/AVH/Pain at this time. Pt appears anxious in affect and mood.Thought process & speech remains tangential/delusional/with flight of ideas. PRN haldol and vistaril requested and given. Pt refused to take scheduled trazodone. Will continue with POC.

## 2018-08-04 NOTE — Progress Notes (Signed)
[facility]  08/04/2018 12:32 PM Lucas Siaerrance Shiffman  MRN:  [id number] Subjective:    Lucas Knapp is a 32 y/o M with history of schizophrenia who was [facility] where he presented with worsening symptoms of disorganization and psychosis including disorganizaed speech, flight of ideas, and AH. He reported being off of medications for an unclear amount of time. He was medically cleared and [facility].He was started on trial of haldol to address symptoms of psychosis and disorganization, and he was transitioned to long-acting injectable form and he received two doses of haldol decanoate 100mg  IM. He has been demonstrating incremental improvement during his hospitalization.  Today upon evaluation, pt shares, "It's been going pretty good. I need to get help with getting emergency food stamps with the thunderstorm." Pt clarifies that he is concerned about discharging to his home and not having any food available since his brother is out of the home. Pt remains mildly pressured and circumstantial, but he is able to be redirected and he is cooperative with the interview overall. Pt reports that overall he is doing well. He reports having adequate sleep, but RN staff recorded 4.5 hours of sleep for the past 2 nights, and pt describes having some early awakening.  Discussed with patient about collateral information from his mother which was concerning that he is still off from his baseline in regards to disorganized thoughts and pressured speech, but he is improving. Discussed with patient that we could potentially aim for discharge later this week if he continues to have incremental improvement, and pt was in agreement. He had no further questions, comments, or concerns.  Principal Problem: Schizophrenia, paranoid type (HCC) Diagnosis:   Patient Active Problem List   Diagnosis Date Noted  . Preventative health care [Z00.00]  09/29/2016  . HTN (hypertension) [I10] 02/18/2016  . Hyperlipidemia [E78.5] 02/18/2016  . Fatigue [R53.83] 02/18/2016  . Low back pain [M54.5] 02/18/2016  . Abdominal pain [R10.9] 11/27/2014  . Schizophrenia, paranoid type (HCC) [F20.0] 09/17/2012    Class: Acute  . Cannabis abuse [F12.10] 09/17/2012   Total Time spent with patient: 30 minutes  Past Psychiatric History: see H&P  Past Medical History:  Past Medical History:  Diagnosis Date  . Back pain   . Bipolar 1 disorder (HCC)   . Hyperlipidemia   . Hypertension    pt has been prescribed HCTZ 12.5 mg daily. Pt was 140/80 on admission.   . Schizophrenia (HCC)    History reviewed. No pertinent surgical history. Family History:  Family History  Problem Relation Age of Onset  . Hypertension Father   . Hyperlipidemia Father   . Hypertension Paternal Grandmother   . Hypertension Paternal Grandfather   . Hypertension Brother        identical twin  . Psychosis Brother        "mental issues"     Family Psychiatric  History: see H&P Social History:  Social History   Substance and Sexual Activity  Alcohol Use Yes  . Alcohol/week: 0.0 standard drinks   Comment: occasionally --"I need to drink more"     Social History   Substance and Sexual Activity  Drug Use Yes  . Types: Marijuana   Comment: Pt endorsed used of marijuana    Social History   Socioeconomic History  . Marital status: Single    Spouse name: Not on file  . Number of children: Not on file  . Years of education:  Not on file  . Highest education level: Not on file  Occupational History  . Not on file  Social Needs  . Financial resource strain: Not on file  . Food insecurity:    Worry: Not on file    Inability: Not on file  . Transportation needs:    Medical: Not on file    Non-medical: Not on file  Tobacco Use  . Smoking status: Current Every Day Smoker    Types: Cigarettes  . Smokeless tobacco: Never Used  . Tobacco comment: 10-15   Substance and Sexual Activity  . Alcohol use: Yes    Alcohol/week: 0.0 standard drinks    Comment: occasionally --"I need to drink more"  . Drug use: Yes    Types: Marijuana    Comment: Pt endorsed used of marijuana  . Sexual activity: Not on file  Lifestyle  . Physical activity:    Days per week: Not on file    Minutes per session: Not on file  . Stress: Not on file  Relationships  . Social connections:    Talks on phone: Not on file    Gets together: Not on file    Attends religious service: Not on file    Active member of club or organization: Not on file    Attends meetings of clubs or organizations: Not on file    Relationship status: Not on file  Other Topics Concern  . 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    Single   No children   Completed college (bachelors in Statistician)   Enjoys swimming, playing pool, walking   Grew up [place].  Has identical twin and 2 other brothers in [place]   Additional Social History:                         Sleep: Fair  Appetite:  Good  Current Medications: Current Facility-Administered Medications  Medication Dose Route Frequency Provider Last Rate Last Dose  . acetaminophen (TYLENOL) tablet 650 mg  650 mg Oral Q6H PRN Charm Rings, NP   650 mg at 07/31/18 1300  . alum & mag hydroxide-simeth (MAALOX/MYLANTA) 200-200-20 MG/5ML suspension 30 mL  30 mL Oral Q4H PRN Charm Rings, NP      . benztropine (COGENTIN) tablet 0.5 mg  0.5 mg Oral Daily Charm Rings, NP   0.5 mg at 08/04/18 0756  . diphenhydrAMINE (BENADRYL) injection 50 mg  50 mg Intramuscular Once PRN Charm Rings, NP      . haloperidol (HALDOL) tablet 5 mg  5 mg Oral BID Charm Rings, NP   5 mg at 08/04/18 0756  . haloperidol (HALDOL) tablet 5 mg  5 mg Oral Q8H PRN Micheal Likens, MD   5 mg at 08/03/18 2108   Or  . haloperidol  lactate (HALDOL) injection 5 mg  5 mg Intramuscular Q8H PRN Micheal Likens, MD      . haloperidol decanoate (HALDOL DECANOATE) 100 MG/ML injection 100 mg  100 mg Intramuscular Q28 days Jolyne Loa T, MD   100 mg at 07/30/18 1719  . hydrochlorothiazide (MICROZIDE) capsule 12.5 mg  12.5 mg Oral Daily Jolyne Loa T, MD   12.5 mg at 08/04/18 0756  . hydrOXYzine (ATARAX/VISTARIL) tablet 25 mg  25 mg Oral TID PRN Elta Guadeloupe  Micheline Rough, NP   25 mg at 08/03/18 2108  . ibuprofen (ADVIL,MOTRIN) tablet 600 mg  600 mg Oral Q6H PRN Micheal Likens, MD   600 mg at 08/03/18 1039  . magnesium hydroxide (MILK OF MAGNESIA) suspension 30 mL  30 mL Oral Daily PRN Charm Rings, NP      . nicotine (NICODERM CQ - dosed in mg/24 hours) patch 21 mg  21 mg Transdermal Daily PRN Micheal Likens, MD   21 mg at 08/02/18 0958  . pantoprazole (PROTONIX) EC tablet 80 mg  80 mg Oral Daily Charm Rings, NP   80 mg at 08/04/18 0755  . pravastatin (PRAVACHOL) tablet 20 mg  20 mg Oral q1800 Charm Rings, NP   20 mg at 08/03/18 1857  . traZODone (DESYREL) tablet 100 mg  100 mg Oral QHS,MR X 1 Kerry Hough, PA-C   100 mg at 07/31/18 0231    Lab Results: No results found for this or any previous visit (from the past 48 hour(s)).  Blood Alcohol level:  Lab Results  Component Value Date   ETH <10 07/27/2018   ETH <10 06/28/2018    Metabolic Disorder Labs: Lab Results  Component Value Date   HGBA1C 5.1 07/29/2018   MPG 99.67 07/29/2018   Lab Results  Component Value Date   PROLACTIN 30.0 (H) 07/29/2018   Lab Results  Component Value Date   CHOL 249 (H) 03/29/2018   TRIG 239.0 (H) 03/29/2018   HDL 34.70 (L) 03/29/2018   CHOLHDL 7 03/29/2018   VLDL 47.8 (H) 03/29/2018   LDLCALC 150 (H) 09/29/2016    Physical Findings: AIMS: Facial and Oral Movements Muscles of Facial Expression: None, normal Lips and Perioral Area: None, normal Jaw: None, normal Tongue:  None, normal,Extremity Movements Upper (arms, wrists, hands, fingers): None, normal Lower (legs, knees, ankles, toes): None, normal, Trunk Movements Neck, shoulders, hips: None, normal, Overall Severity Severity of abnormal movements (highest score from questions above): None, normal Incapacitation due to abnormal movements: None, normal Patient's awareness of abnormal movements (rate only patient's report): No Awareness, Dental Status Current problems with teeth and/or dentures?: No Does patient usually wear dentures?: No  CIWA:    COWS:     Musculoskeletal: Strength & Muscle Tone: within normal limits Gait & Station: normal Patient leans: N/A  Psychiatric Specialty Exam: Physical Exam  Nursing note and vitals reviewed.   Review of Systems  Constitutional: Negative for chills and fever.  Respiratory: Negative for cough and shortness of breath.   Cardiovascular: Negative for chest pain.  Gastrointestinal: Negative for abdominal pain, heartburn, nausea and vomiting.  Psychiatric/Behavioral: Negative for depression, hallucinations and suicidal ideas. The patient is not nervous/anxious and does not have insomnia.     Blood pressure (!) 132/111, pulse 90, temperature 99.1 F (37.3 C), temperature source Oral, resp. rate 20, height 5\' 4"  (1.626 m), weight 101.3 kg, SpO2 99 %.Body mass index is 38.35 kg/m.  General Appearance: Casual and Fairly Groomed  Eye Contact:  Good  Speech:  Clear and Coherent and Pressured  Volume:  Normal  Mood:  Euthymic  Affect:  Appropriate, Blunt and Congruent  Thought Process:  Coherent, Disorganized and Descriptions of Associations: Circumstantial  Orientation:  Full (Time, Place, and Person)  Thought Content:  Logical  Suicidal Thoughts:  No  Homicidal Thoughts:  No  Memory:  Immediate;   Fair Recent;   Fair Remote;   Fair  Judgement:  Poor  Insight:  Lacking  Psychomotor  Activity:  Normal  Concentration:  Concentration: Fair  Recall:  [employer]   Fund of Knowledge:  Fair  Language:  Fair  Akathisia:  No  Handed:    AIMS (if indicated):     Assets:  Resilience Social Support  ADL's:  Intact  Cognition:  WNL  Sleep:  Number of Hours: 4.5   Treatment Plan Summary: Daily contact with patient to assess and evaluate symptoms and progress in treatment and Medication management   -Continue inpatient hospitalization  -Schizophrenia, paranoid type -Continue haldol 5mg  po BID -Continuehaldol decanoate 100mg  IM q28 (100mg  IM administered on8/9/19 and 08/02/18)  -HTN -Continue HCTZ 12.5mg  po qDay  -anxiety -Continue vistaril 25mg  po TID prn anxiety  -psychosis/agitation -Continue haldol 5mg  po/IM q8h prn agitation/psychosis  -EPS -Continue cogentin 0.5mg  po qDay  -GERD -Continue protonix 80mg  po qDay  -HLD -Continue pravastatin 20mg  po qDay  -insomnia -Continue trazodone 100mg  po qhs prn insomnia (may repeat x1)  -Encourage participation in groups and therapeutic milieu  -disposition planning may be ongoing  Micheal Likenshristopher T Keshayla Schrum, MD 08/04/2018, 12:32 PM

## 2018-08-05 NOTE — Progress Notes (Signed)
Patient denies SI, HI, AVH this shift. Patient has attended groups, engaged in unit activities and been compliant with medications.  Patient has had no incidents of behavioral dyscontrol this shift.   Assess patient for safety, offer medications as planned, engage patient in 1:1 staff talks.   Patient able to contract for safety.  Continue to monitor as planned.  

## 2018-08-05 NOTE — Progress Notes (Signed)
[facility]  08/05/2018 11:55 AM Lucas Knapp  MRN:  [id number] Subjective:    Lucas Knapp is a 32 y/o M with history of schizophrenia who was [facility] voluntarily from [facility] where he presented with worsening symptoms of disorganization and psychosis including disorganizaed speech, flight of ideas, and AH. He reported being off of medications for an unclear amount of time. He was medically cleared and [facility].He was started on trial of haldol to address symptoms of psychosis and disorganization, and he was transitioned to long-acting injectable formand he received two doses of haldol decanoate 100mg  IM. He has been demonstrating incremental improvement during his hospitalization.  Today upon evaluation, pt reports he is doing well overall and he has no specific concerns today. His thought process remains mildly pressured and circumstantial, but he continues to have incremental daily improvement. Pt shares, "I'm thinking about when I get out I gotta check on my check." Pt reports she is sleeping well. His appetite is good. He has been tolerating his medications well, and he is in agreement to continue his current regimen without changes. He denies SI/HI/AH/VH. Discussed with patient about treatment team's coordination of aftercare plan with his mother, whom has expressed concerns with patient returning to his apartment over the weekend without supervision as his twin brother (and roommate) is gone for the weekend. Pt's mother may have some family/friends available to stay with the patient over the weekend, but she needs today to make arrangements, and she expresses concerns that patient may decompensate over the weekend if he is left on his own. Pt confirms that he feels some anxiety about returning to stay on his own without any assistance directly from the hospital, and he verbalized agreement with potential discharge as soon as  tomorrow if there is an adequate safety plan in place to check on the patient. He had no further questions, comments, or concerns.  Principal Problem: Schizophrenia, paranoid type (HCC) Diagnosis:   Patient Active Problem List   Diagnosis Date Noted  . Preventative health care [Z00.00] 09/29/2016  . HTN (hypertension) [I10] 02/18/2016  . Hyperlipidemia [E78.5] 02/18/2016  . Fatigue [R53.83] 02/18/2016  . Low back pain [M54.5] 02/18/2016  . Abdominal pain [R10.9] 11/27/2014  . Schizophrenia, paranoid type (HCC) [F20.0] 09/17/2012    Class: Acute  . Cannabis abuse [F12.10] 09/17/2012   Total Time spent with patient: 30 minutes  Past Psychiatric History: see H&P  Past Medical History:  Past Medical History:  Diagnosis Date  . Back pain   . Bipolar 1 disorder (HCC)   . Hyperlipidemia   . Hypertension    pt has been prescribed HCTZ 12.5 mg daily. Pt was 140/80 on admission.   . Schizophrenia (HCC)    History reviewed. No pertinent surgical history. Family History:  Family History  Problem Relation Age of Onset  . Hypertension Father   . Hyperlipidemia Father   . Hypertension Paternal Grandmother   . Hypertension Paternal Grandfather   . Hypertension Brother        identical twin  . Psychosis Brother        "mental issues"     Family Psychiatric  History: see H&P Social History:  Social History   Substance and Sexual Activity  Alcohol Use Yes  . Alcohol/week: 0.0 standard drinks   Comment: occasionally --"I need to drink more"     Social History   Substance and Sexual Activity  Drug Use Yes  . Types:  Marijuana   Comment: Pt endorsed used of marijuana    Social History   Socioeconomic History  . Marital status: Single    Spouse name: Not on file  . Number of children: Not on file  . Years of education: Not on file  . Highest education level: Not on file  Occupational History  . Not on file  Social Needs  . Financial resource strain: Not on file  . Food  insecurity:    Worry: Not on file    Inability: Not on file  . Transportation needs:    Medical: Not on file    Non-medical: Not on file  Tobacco Use  . Smoking status: Current Every Day Smoker    Types: Cigarettes  . Smokeless tobacco: Never Used  . Tobacco comment: 10-15  Substance and Sexual Activity  . Alcohol use: Yes    Alcohol/week: 0.0 standard drinks    Comment: occasionally --"I need to drink more"  . Drug use: Yes    Types: Marijuana    Comment: Pt endorsed used of marijuana  . Sexual activity: Not on file  Lifestyle  . Physical activity:    Days per week: Not on file    Minutes per session: Not on file  . Stress: Not on file  Relationships  . Social connections:    Talks on phone: Not on file    Gets together: Not on file    Attends religious service: Not on file    Active member of club or organization: Not on file    Attends meetings of clubs or organizations: Not on file    Relationship status: Not on file  Other Topics Concern  . 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    Single   No children   Completed college (bachelors in Statistician)   Enjoys swimming, playing pool, walking   Grew up [place].  Has identical twin and 2 other brothers in [place]   Additional Social History:                         Sleep: Fair  Appetite:  Good  Current Medications: Current Facility-Administered Medications  Medication Dose Route Frequency Provider Last Rate Last Dose  . acetaminophen (TYLENOL) tablet 650 mg  650 mg Oral Q6H PRN Charm Rings, NP   650 mg at 07/31/18 1300  . alum & mag hydroxide-simeth (MAALOX/MYLANTA) 200-200-20 MG/5ML suspension 30 mL  30 mL Oral Q4H PRN Charm Rings, NP      . benztropine (COGENTIN) tablet 0.5 mg  0.5 mg Oral Daily Charm Rings, NP   0.5 mg at 08/05/18 0740  . diphenhydrAMINE (BENADRYL) injection 50 mg   50 mg Intramuscular Once PRN Charm Rings, NP      . haloperidol (HALDOL) tablet 5 mg  5 mg Oral BID Charm Rings, NP   5 mg at 08/05/18 0740  . haloperidol (HALDOL) tablet 5 mg  5 mg Oral Q8H PRN Micheal Likens, MD   5 mg at 08/04/18 2050   Or  . haloperidol lactate (HALDOL) injection 5 mg  5 mg Intramuscular Q8H PRN Micheal Likens, MD      . haloperidol decanoate (HALDOL DECANOATE) 100 MG/ML injection 100 mg  100 mg Intramuscular Q28 days Micheal Likens, MD  100 mg at 07/30/18 1719  . hydrochlorothiazide (MICROZIDE) capsule 12.5 mg  12.5 mg Oral Daily Jolyne Loa T, MD   12.5 mg at 08/05/18 0740  . hydrOXYzine (ATARAX/VISTARIL) tablet 25 mg  25 mg Oral TID PRN Laveda Abbe, NP   25 mg at 08/04/18 2050  . ibuprofen (ADVIL,MOTRIN) tablet 600 mg  600 mg Oral Q6H PRN Micheal Likens, MD   600 mg at 08/03/18 1039  . magnesium hydroxide (MILK OF MAGNESIA) suspension 30 mL  30 mL Oral Daily PRN Charm Rings, NP      . nicotine (NICODERM CQ - dosed in mg/24 hours) patch 21 mg  21 mg Transdermal Daily PRN Micheal Likens, MD   21 mg at 08/02/18 0958  . pantoprazole (PROTONIX) EC tablet 80 mg  80 mg Oral Daily Charm Rings, NP   80 mg at 08/05/18 0740  . pravastatin (PRAVACHOL) tablet 20 mg  20 mg Oral q1800 Charm Rings, NP   20 mg at 08/04/18 1723  . traZODone (DESYREL) tablet 100 mg  100 mg Oral QHS,MR X 1 Kerry Hough, PA-C   100 mg at 07/31/18 0231    Lab Results: No results found for this or any previous visit (from the past 48 hour(s)).  Blood Alcohol level:  Lab Results  Component Value Date   ETH <10 07/27/2018   ETH <10 06/28/2018    Metabolic Disorder Labs: Lab Results  Component Value Date   HGBA1C 5.1 07/29/2018   MPG 99.67 07/29/2018   Lab Results  Component Value Date   PROLACTIN 30.0 (H) 07/29/2018   Lab Results  Component Value Date   CHOL 249 (H) 03/29/2018   TRIG 239.0 (H)  03/29/2018   HDL 34.70 (L) 03/29/2018   CHOLHDL 7 03/29/2018   VLDL 47.8 (H) 03/29/2018   LDLCALC 150 (H) 09/29/2016    Physical Findings: AIMS: Facial and Oral Movements Muscles of Facial Expression: None, normal Lips and Perioral Area: None, normal Jaw: None, normal Tongue: None, normal,Extremity Movements Upper (arms, wrists, hands, fingers): None, normal Lower (legs, knees, ankles, toes): None, normal, Trunk Movements Neck, shoulders, hips: None, normal, Overall Severity Severity of abnormal movements (highest score from questions above): None, normal Incapacitation due to abnormal movements: None, normal Patient's awareness of abnormal movements (rate only patient's report): No Awareness, Dental Status Current problems with teeth and/or dentures?: No Does patient usually wear dentures?: No  CIWA:    COWS:     Musculoskeletal: Strength & Muscle Tone: within normal limits Gait & Station: normal Patient leans: N/A  Psychiatric Specialty Exam: Physical Exam  Nursing note and vitals reviewed.   Review of Systems  Constitutional: Negative for chills and fever.  Respiratory: Negative for cough and shortness of breath.   Cardiovascular: Negative for chest pain.  Gastrointestinal: Negative for abdominal pain, heartburn, nausea and vomiting.  Psychiatric/Behavioral: Negative for depression, hallucinations and suicidal ideas. The patient is not nervous/anxious and does not have insomnia.     Blood pressure 120/88, pulse 92, temperature 99.7 F (37.6 C), temperature source Oral, resp. rate 20, height 5\' 4"  (1.626 m), weight 101.3 kg, SpO2 99 %.Body mass index is 38.35 kg/m.  General Appearance: Casual and Fairly Groomed  Eye Contact:  Good  Speech:  Clear and Coherent and Normal Rate  Volume:  Normal  Mood:  Anxious and Depressed  Affect:  Appropriate and Congruent  Thought Process:  Coherent, Disorganized, Goal Directed and Descriptions of Associations: Tangential  Orientation:  Full (Time, Place, and Person)  Thought Content:  Logical and Tangential  Suicidal Thoughts:  No  Homicidal Thoughts:  No  Memory:  Immediate;   Fair Recent;   Fair Remote;   Fair  Judgement:  Poor  Insight:  Lacking  Psychomotor Activity:  Normal  Concentration:  Concentration: Fair  Recall:  [employer]Fair  Fund of Knowledge:  Fair  Language:  Fair  Akathisia:  No  Handed:    AIMS (if indicated):     Assets:  Resilience Social Support  ADL's:  Intact  Cognition:  WNL  Sleep:  Number of Hours: 5.75   Treatment Plan Summary: Daily contact with patient to assess and evaluate symptoms and progress in treatment and Medication management   -Continue inpatient hospitalization  -Schizophrenia, paranoid type -Continue haldol 5mg  po BID -Continuehaldol decanoate 100mg  IM q28 (100mg  IMadministered on8/9/19and 08/02/18)  -HTN -Continue HCTZ 12.5mg  po qDay  -anxiety -Continue vistaril 25mg  po TID prn anxiety  -psychosis/agitation -Continue haldol 5mg  po/IM q8h prn agitation/psychosis  -EPS -Continue cogentin 0.5mg  po qDay  -GERD -Continue protonix 80mg  po qDay  -HLD -Continue pravastatin 20mg  po qDay  -insomnia -Continue trazodone 100mg  po qhs prn insomnia (may repeat x1)  -Encourage participation in groups and therapeutic milieu  -disposition planning is ongoing  Micheal Likenshristopher T Eshaan Titzer, MD 08/05/2018, 11:55 AM

## 2018-08-05 NOTE — Progress Notes (Signed)
Adult Psychoeducational Group Note  Date:  08/05/2018 Time:  9:24 PM  Group Topic/Focus:  Wrap-Up Group:   The focus of this group is to help patients review their daily goal of treatment and discuss progress on daily workbooks.  Participation Level:  Active  Participation Quality:  Appropriate  Affect:  Appropriate  Cognitive:  Appropriate  Insight: Appropriate  Engagement in Group:  Engaged  Modes of Intervention:  Discussion  Additional Comments:  Patient attended wrap-up group and said that his day was a 9. He attended all the groups today and he was excited that he will be discharged tomorrow.   Katelinn Justice W Shirlena Brinegar 08/05/2018, 9:24 PM

## 2018-08-05 NOTE — [facility] Group Notes (Signed)
LCSW Group Therapy Note  08/05/2018 1:15pm  Type of Therapy and Topic:  Group Therapy:  Feelings around Relapse and Recovery  Participation Level:  Did Not Attend   Description of Group:    Patients in this group will discuss emotions they experience before and after a relapse. They will process how experiencing these feelings, or avoidance of experiencing them, relates to having a relapse. Facilitator will guide patients to explore emotions they have related to recovery. Patients will be encouraged to process which emotions are more powerful. They will be guided to discuss the emotional reaction significant others in their lives may have to their relapse or recovery. Patients will be assisted in exploring ways to respond to the emotions of others without this contributing to a relapse.  Therapeutic Goals: 1. Patient will identify two or more emotions that lead to a relapse for them 2. Patient will identify two emotions that result when they relapse 3. Patient will identify two emotions related to recovery 4. Patient will demonstrate ability to communicate their needs through discussion and/or role plays   Summary of Patient Progress:     Therapeutic Modalities:   Cognitive Behavioral Therapy Solution-Focused Therapy Assertiveness Training Relapse Prevention Therapy   Ida RogueRodney B Lu Paradise, LCSW 08/05/2018 5:13 PM

## 2018-08-05 NOTE — Plan of Care (Signed)
  Problem: Health Behavior/Discharge Planning: Goal: Compliance with prescribed medication regimen will improve Outcome: Completed/Met Note:  Huriel takes his medications as prescribed and will ask for prn's if needed

## 2018-08-06 DIAGNOSIS — F250 Schizoaffective disorder, bipolar type: Secondary | ICD-10-CM | POA: Diagnosis not present

## 2018-08-06 MED ORDER — HYDROXYZINE HCL 25 MG PO TABS: 25.0000 mg | ORAL_TABLET | Freq: Three times a day (TID) | ORAL | 0 refills | Status: DC | PRN

## 2018-08-06 MED ORDER — PANTOPRAZOLE SODIUM 40 MG PO TBEC: 80.0000 mg | DELAYED_RELEASE_TABLET | Freq: Every day | ORAL | 0 refills | Status: DC

## 2018-08-06 MED ORDER — HYDROCHLOROTHIAZIDE 12.5 MG PO CAPS: 12.5000 mg | ORAL_CAPSULE | Freq: Every day | ORAL | 0 refills | Status: DC

## 2018-08-06 MED ORDER — BENZTROPINE MESYLATE 0.5 MG PO TABS: 0.5000 mg | ORAL_TABLET | Freq: Every day | ORAL | 0 refills | Status: DC

## 2018-08-06 MED ORDER — NICOTINE 21 MG/24HR TD PT24: 21.0000 mg | MEDICATED_PATCH | Freq: Every day | TRANSDERMAL | 0 refills | Status: DC | PRN

## 2018-08-06 MED ORDER — PRAVASTATIN SODIUM 20 MG PO TABS: 20.0000 mg | ORAL_TABLET | Freq: Every day | ORAL | 0 refills | Status: DC

## 2018-08-06 MED ORDER — HALOPERIDOL DECANOATE 100 MG/ML IM SOLN: 100.0000 mg | INTRAMUSCULAR | 0 refills | Status: DC

## 2018-08-06 MED ORDER — TRAZODONE HCL 100 MG PO TABS: 100.0000 mg | ORAL_TABLET | Freq: Every evening | ORAL | 0 refills | Status: DC | PRN

## 2018-08-06 MED ORDER — HALOPERIDOL 5 MG PO TABS: 5.0000 mg | ORAL_TABLET | Freq: Two times a day (BID) | ORAL | 0 refills | Status: DC

## 2018-08-06 NOTE — Progress Notes (Signed)
  [facility] :  Will you be returning to the same living situation after discharge:  Yes,  home At discharge, do you have transportation home?: Yes,  security to get car in [facility] parking lot Do you have the ability to pay for your medications: Yes,  insurance  Release of information consent forms completed and in the chart;  Patient's signature needed at discharge.  Patient to Follow up at: Follow-up Information    [name] Follow up on 08/06/2018.   Why:  Friday at 8AM for your hospital follow up appointment.  Bring along your hospital d/c paperwork Contact information: [address] [place] [address] [phone]           Next level of care provider [facility] Link:no  Safety Planning and Suicide Prevention discussed: Yes,  yes  Have you used any form of tobacco in the last 30 days? (Cigarettes, Smokeless Tobacco, Cigars, and/or Pipes): Patient Refused Screening  Has patient been referred to the Quitline?: Patient refused referral  Patient has been referred for addiction treatment: N/A  Ida RogueRodney B Sheryll Dymek, LCSW 08/06/2018, 10:24 AM

## 2018-08-06 NOTE — Progress Notes (Signed)
D: Pt A & O X 4. Denies SI, HI, AVH and pain at this time. Presents animated, hyper-verbal and interactive with staff and peers. Reports excitement related to D/C "I'm just ready to go home".  D/C home as ordered. Picked up in lobby by by [employer] [place].  A: D/C instructions reviewed with pt including prescriptions and [facility]; compliance encouraged. All belongings from locker # given to pt at time of departure. Scheduled and PRN medications given with verbal education and effects monitored. Safety checks maintained without incident till time of d/c.  R: Pt receptive to care. Compliant with medications when offered. Denies adverse drug reactions when assessed. Verbalized understanding related to d/c instructions. Signed belonging sheet in agreement with items received from locker. Ambulatory with a steady gait. Appears to be in no physical distress at time of departure.

## 2018-08-06 NOTE — Progress Notes (Signed)
D:  Lucas Knapp attended evening wrap up group.  He reported having a good day and is looking forward to being discharged tomorrow.  He denied SI/HI or A/V hallucinations.  He continues to be delusional but pleasant and cooperative.  He denied pain or discomfort and appeared to be in no physical distress.  No prn's given this evening and he declined taking his scheduled trazodone.  He is currently resting with his eyes closed and appeared to be asleep. A:  1:1 with RN for support and encouragement.  Medications as ordered.  Q 15 minute checks maintained for safety.  Encouraged participation in group and unit activities.   R:  [name] remains safe on the unit.  We will continue to monitor the progress towards his goals..Marland Kitchen

## 2018-08-06 NOTE — [facility] Suicide Risk Assessment (Signed)
[facility]   Principal Problem: Schizophrenia, paranoid type [facility](HCC) Discharge Diagnoses:  Patient Active Problem List   Diagnosis Date Noted  . Preventative health care [Z00.00] 09/29/2016  . HTN (hypertension) [I10] 02/18/2016  . Hyperlipidemia [E78.5] 02/18/2016  . Fatigue [R53.83] 02/18/2016  . Low back pain [M54.5] 02/18/2016  . Abdominal pain [R10.9] 11/27/2014  . Schizophrenia, paranoid type (HCC) [F20.0] 09/17/2012    Class: Acute  . Cannabis abuse [F12.10] 09/17/2012    Total Time spent with patient: 30 minutes  Musculoskeletal: Strength & Muscle Tone: within normal limits Gait & Station: normal Patient leans: N/A  Psychiatric Specialty Exam: Review of Systems  Constitutional: Negative for chills and fever.  Respiratory: Negative for cough and shortness of breath.   Cardiovascular: Negative for chest pain.  Gastrointestinal: Negative for abdominal pain, heartburn, nausea and vomiting.  Psychiatric/Behavioral: Negative for depression, hallucinations and suicidal ideas. The patient is not nervous/anxious and does not have insomnia.     Blood pressure (!) 134/97, pulse 88, temperature 98.9 F (37.2 C), temperature source Oral, resp. rate 20, height 5\' 4"  (1.626 m), weight 101.3 kg, SpO2 99 %.Body mass index is 38.35 kg/m.  General Appearance: Casual and Fairly Groomed  Patent attorneyye Contact::  Good  Speech:  Clear and Coherent and Normal Rate  Volume:  Normal  Mood:  Euthymic  Affect:  Appropriate, Blunt and Congruent  Thought Process:  Coherent, Goal Directed and Descriptions of Associations: Loose  Orientation:  Full (Time, Place, and Person)  Thought Content:  Logical and Tangential  Suicidal Thoughts:  No  Homicidal Thoughts:  No  Memory:  Immediate;   Fair Recent;   Fair Remote;   Fair  Judgement:  Fair  Insight:  Fair  Psychomotor Activity:  Normal  Concentration:  Fair  Recall:  FiservFair  Fund of Knowledge:Fair  Language: Fair   Akathisia:  No  Handed:    AIMS (if indicated):     Assets:  Resilience Social Support  Sleep:  Number of Hours: 4.5  Cognition: WNL  ADL's:  Intact   Mental Status Per Nursing Assessment::   On Admission:  NA  Demographic Factors:  Male and Low socioeconomic status  Loss Factors: Financial problems/change in socioeconomic status  Historical Factors: Impulsivity  Risk Reduction Factors:   Sense of responsibility to family, Living with another person, especially a relative, Positive social support, Positive therapeutic relationship and Positive coping skills or problem solving skills  Continued Clinical Symptoms:  Schizophrenia:   Paranoid or undifferentiated type  Cognitive Features That Contribute To Risk:  None    Suicide Risk:  Minimal: No identifiable suicidal ideation.  Patients presenting with no risk factors but with morbid ruminations; may be classified as minimal risk based on the severity of the depressive symptoms  Follow-up Information    [name] Follow up on 08/06/2018.   Why:  Friday at 8AM for your hospital follow up appointment.  Bring along your hospital d/c paperwork Contact information: [address] [place] [address] [phone]         Subjective Data:  Lucas Knapp is a 32 y/o M with history of schizophrenia who was [facility] from [facility] where he presented with worsening symptoms of disorganization and psychosis including disorganizaed speech, flight of ideas, and AH. He reported being off of medications for an unclear amount of time. He was medically cleared [facility].He was started on trial of haldol to address symptoms of  psychosis and disorganization, and he was transitioned to long-acting injectable formand he received two doses of haldol decanoate 100mg  IM. He has been demonstrating incremental improvement during his hospitalization.  Today upon evaluation,  pt shares, "Hey, I'm doing alright. I'm want to spend some time with my aunt. Do you think I can have a note for work?" Pt reports he is doing well overall and he has no specific concerns today. He remains slightly tangential, but he appears to be near his baseline. He denies physical complaints. He denies SI/HI/AH/VH. Pt reports he is tolerating his medications well, and he is in agreement to continue his current regimen without changes. He was able to engage in safety planning including [facility] or contact emergency services if he feels unable to maintain his own safety or the safety of others. Pt had no further questions, comments, or concerns.   Plan Of Care/Follow-up recommendations:   -Discharge to outpatient level of care  -Schizophrenia, paranoid type -Continue haldol 5mg  po BID -Continuehaldol decanoate 100mg  IM q28 (100mg  IMadministered on8/9/19and 08/02/18)  -HTN -Continue HCTZ 12.5mg  po qDay  -anxiety -Continue vistaril 25mg  po TID prn anxiety  -EPS -Continue cogentin 0.5mg  po qDay  -GERD -Continue protonix 80mg  po qDay  -HLD -Continue pravastatin 20mg  po qDay  -insomnia -Continue trazodone 100mg  po qhs prn insomnia  Activity:  as tolerated Diet:  normal Tests:  NA Other:  see above for DC plan  Lucas [name], MD 08/06/2018, 8:29 AM

## 2018-08-06 NOTE — Discharge Summary (Addendum)
Physician Discharge Summary Note  Patient:  Ellamae Siaerrance Arca is an 32 y.o., male  MRN:  [id number]  DOB:  10/19/1986  Patient phone:  [phone] (home)   Patient address:   [address] [place] [address],   Total Time spent with patient: Greater than 30 minutes  Date of Admission:  07/27/2018  Date of Discharge: 08-06-18  Reason for Admission: Worsening symptoms of disorganization and psychosis including disorganizaed speech, flight of ideas, and AH.  Principal Problem: Schizophrenia, paranoid type [facility](HCC)  Discharge Diagnoses: Patient Active Problem List   Diagnosis Date Noted  . Schizophrenia, paranoid type (HCC) [F20.0] 09/17/2012    Priority: High    Class: Acute  . Preventative health care [Z00.00] 09/29/2016  . HTN (hypertension) [I10] 02/18/2016  . Hyperlipidemia [E78.5] 02/18/2016  . Fatigue [R53.83] 02/18/2016  . Low back pain [M54.5] 02/18/2016  . Abdominal pain [R10.9] 11/27/2014  . Cannabis abuse [F12.10] 09/17/2012   Past Psychiatric History: Paranoid Schizophrenia.  Past Medical History:  Past Medical History:  Diagnosis Date  . Back pain   . Bipolar 1 disorder (HCC)   . Hyperlipidemia   . Hypertension    pt has been prescribed HCTZ 12.5 mg daily. Pt was 140/80 on admission.   . Schizophrenia (HCC)    History reviewed. No pertinent surgical history.  Family History:  Family History  Problem Relation Age of Onset  . Hypertension Father   . Hyperlipidemia Father   . Hypertension Paternal Grandmother   . Hypertension Paternal Grandfather   . Hypertension Brother        identical twin  . Psychosis Brother        "mental issues"     Family Psychiatric  History: See H&P.  Social History:  Social History   Substance and Sexual Activity  Alcohol Use Yes  . Alcohol/week: 0.0 standard drinks   Comment: occasionally --"I need to drink more"     Social History   Substance and Sexual Activity  Drug Use Yes  . Types:  Marijuana   Comment: Pt endorsed used of marijuana    Social History   Socioeconomic History  . Marital status: Single    Spouse name: Not on file  . Number of children: Not on file  . Years of education: Not on file  . Highest education level: Not on file  Occupational History  . Not on file  Social Needs  . Financial resource strain: Not on file  . Food insecurity:    Worry: Not on file    Inability: Not on file  . Transportation needs:    Medical: Not on file    Non-medical: Not on file  Tobacco Use  . Smoking status: Current Every Day Smoker    Types: Cigarettes  . Smokeless tobacco: Never Used  . Tobacco comment: 10-15  Substance and Sexual Activity  . Alcohol use: Yes    Alcohol/week: 0.0 standard drinks    Comment: occasionally --"I need to drink more"  . Drug use: Yes    Types: Marijuana    Comment: Pt endorsed used of marijuana  . Sexual activity: Not on file  Lifestyle  . Physical activity:    Days per week: Not on file    Minutes per session: Not on file  . Stress: Not on file  Relationships  . Social connections:    Talks on phone: Not on file    Gets together: Not on file    Attends religious service:  Not on file    Active member of club or organization: Not on file    Attends meetings of clubs or organizations: Not on file    Relationship status: Not on file  Other Topics Concern  . 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    Single   No children   Completed college (bachelors in Statistician)   Enjoys swimming, playing pool, walking   Grew up [place].  Has identical twin and 2 other brothers in [facility] Course: (Per Md's discharge SRA): Arlando Leisinger is a 33 y/o M with history of schizophrenia who was [facility] from [facility] where he presented with worsening symptoms of disorganization and psychosis including  disorganizaed speech, flight of ideas, and AH. He reported being off of medications for an unclear amount of time. He was medically cleared and then transferred to [facility] for additional treatment and stabilization.He was started on trial of haldol to address symptoms of psychosis and disorganization, and he was transitioned to long-acting injectable formand he received two doses of haldol decanoate 100mg  IM. He has been demonstrating incremental improvement during his hospitalization.  Today upon evaluation, ptshares, "Hey, I'm doing alright. I'm want to spend some time with my aunt. Do you think I can have a note for work?" Pt reports he is doing well overall and he has no specific concerns today. He remains slightly tangential, but he appears to be near his baseline. He denies physical complaints. He denies SI/HI/AH/VH. Pt reports he is tolerating his medications well, and he is in agreement to continue his current regimen without changes. He was able to engage in safety planning including plan to return to [facility] or contact emergency services if he feels unable to maintain his own safety or the safety of others. Pt had no further questions, comments, or concerns.  Plan Of Care/Follow-up recommendations:   -Discharge to outpatient level of care  -Schizophrenia, paranoid type -Continue haldol 5mg  po BID -Continuehaldol decanoate 100mg  IM q28 (100mg  IMadministered on8/9/19and 08/02/18)  -HTN -Continue HCTZ 12.5mg  po qDay  -anxiety -Continue vistaril 25mg  po TID prn anxiety  -EPS -Continue cogentin 0.5mg  po qDay  -GERD -Continue protonix 80mg  po qDay  -HLD -Continue pravastatin 20mg  po qDay  -insomnia -Continue trazodone 100mg  po qhs prn insomnia  Activity:  as tolerated Diet:  normal Tests:  NA Other:  see above for DC plan  Physical Findings: AIMS: Facial and Oral  Movements Muscles of Facial Expression: None, normal Lips and Perioral Area: None, normal Jaw: None, normal Tongue: None, normal,Extremity Movements Upper (arms, wrists, hands, fingers): None, normal Lower (legs, knees, ankles, toes): None, normal, Trunk Movements Neck, shoulders, hips: None, normal, Overall Severity Severity of abnormal movements (highest score from questions above): None, normal Incapacitation due to abnormal movements: None, normal Patient's awareness of abnormal movements (rate only patient's report): No Awareness, Dental Status Current problems with teeth and/or dentures?: No Does patient usually wear dentures?: No  CIWA:    COWS:     Musculoskeletal: Strength & Muscle Tone: within normal limits Gait & Station: normal Patient leans: N/A  Psychiatric Specialty Exam: Physical Exam  Constitutional: He appears well-developed.  HENT:  Head: Normocephalic.  Eyes: Pupils are equal, round, and reactive to light.  Neck: Normal range of motion.  Cardiovascular:  Hx. HTN  Respiratory: Effort normal.  GI: Soft.  Genitourinary:  Genitourinary Comments: Deferred  Musculoskeletal: Normal range of motion.  Neurological: He is alert.  Skin: Skin is warm.    Review of Systems  Constitutional: Negative.   HENT: Negative.   Eyes: Negative.   Respiratory: Negative.  Negative for cough and shortness of breath.   Cardiovascular: Negative.  Negative for chest pain and palpitations.  Gastrointestinal: Negative.   Genitourinary: Negative.   Musculoskeletal: Negative.   Skin: Negative.   Neurological: Negative.   Endo/Heme/Allergies: Negative.   Psychiatric/Behavioral: Positive for depression, hallucinations (Hx. psychosis) and substance abuse (Hx. THC use). Negative for memory loss and suicidal ideas. The patient has insomnia (Stable). The patient is not nervous/anxious.     Blood pressure (!) 134/97, pulse 88, temperature 98.9 F (37.2 C), temperature source Oral,  resp. rate 20, height 5\' 4"  (1.626 m), weight 101.3 kg, SpO2 99 %.Body mass index is 38.35 kg/m.  See H&P   Have you used any form of tobacco in the last 30 days? (Cigarettes, Smokeless Tobacco, Cigars, and/or Pipes): Patient Refused Screening  Has this patient used any form of tobacco in the last 30 days? (Cigarettes, Smokeless Tobacco, Cigars, and/or Pipes): Yes, an FDA-approved tobacco cessation medication was offered at discharge.  Blood Alcohol level:  Lab Results  Component Value Date   ETH <10 07/27/2018   ETH <10 06/28/2018   Metabolic Disorder Labs: Lab Results  Component Value Date   HGBA1C 5.1 07/29/2018   MPG 99.67 07/29/2018   Lab Results  Component Value Date   PROLACTIN 30.0 (H) 07/29/2018   Lab Results  Component Value Date   CHOL 249 (H) 03/29/2018   TRIG 239.0 (H) 03/29/2018   HDL 34.70 (L) 03/29/2018   CHOLHDL 7 03/29/2018   VLDL 47.8 (H) 03/29/2018   LDLCALC 150 (H) 09/29/2016   See Psychiatric Specialty Exam and Suicide Risk Assessment completed by Attending Physician prior to discharge.  Discharge destination:  Home  Is patient on multiple antipsychotic therapies at discharge:  No   Has Patient had three or more failed trials of antipsychotic monotherapy by history:  No  Recommended Plan for Multiple Antipsychotic Therapies: NA  Allergies as of 08/06/2018      Reactions   Geodon [ziprasidone Hcl] Other (See Comments)   Reaction:  Made pts throat dry       Medication List    STOP taking these medications   betamethasone dipropionate 0.05 % cream Commonly known as:  DIPROLENE   diphenhydrAMINE 25 MG tablet Commonly known as:  BENADRYL   lovastatin 20 MG tablet Commonly known as:  MEVACOR Replaced by:  pravastatin 20 MG tablet   meloxicam 7.5 MG tablet Commonly known as:  MOBIC   omeprazole 40 MG capsule Commonly known as:  PRILOSEC Replaced by:  pantoprazole 40 MG tablet     TAKE these medications     Indication  benztropine  0.5 MG tablet Commonly known as:  COGENTIN Take 1 tablet (0.5 mg total) by mouth daily. For prevention of drug induced tremors Start taking on:  08/07/2018  Indication:  Extrapyramidal Reaction caused by Medications   haloperidol 5 MG tablet Commonly known as:  HALDOL Take 1 tablet (5 mg total) by mouth 2 (two) times daily. For mood control What changed:    how much to take  how to take this  when to take this  additional instructions  Indication:  Mood control   haloperidol decanoate 100 MG/ML injection Commonly known as:  HALDOL DECANOATE Inject 1 mL (100  mg total) into the muscle every 28 (twenty-eight) days. (Due on 08-27-18): For mood control Start taking on:  08/27/2018  Indication:  Mood control   hydrochlorothiazide 12.5 MG capsule Commonly known as:  MICROZIDE Take 1 capsule (12.5 mg total) by mouth daily. For high blood pressure Start taking on:  08/07/2018  Indication:  High Blood Pressure Disorder   hydrOXYzine 25 MG tablet Commonly known as:  ATARAX/VISTARIL Take 1 tablet (25 mg total) by mouth 3 (three) times daily as needed for anxiety.  Indication:  Feeling Anxious   nicotine 21 mg/24hr patch Commonly known as:  NICODERM CQ - dosed in mg/24 hours Place 1 patch (21 mg total) onto the skin daily as needed (smoking cessation). (May buy from over the counter): For smoking cessation  Indication:  Nicotine Addiction   pantoprazole 40 MG tablet Commonly known as:  PROTONIX Take 2 tablets (80 mg total) by mouth daily. For acid rflux Start taking on:  08/07/2018 Replaces:  omeprazole 40 MG capsule  Indication:  Gastroesophageal Reflux Disease   pravastatin 20 MG tablet Commonly known as:  PRAVACHOL Take 1 tablet (20 mg total) by mouth daily at 6 PM. For high cholesterol Replaces:  lovastatin 20 MG tablet  Indication:  Inherited Heterozygous Hypercholesterolemia, High Amount of Triglycerides in the Blood   traZODone 100 MG tablet Commonly known as:   DESYREL Take 1 tablet (100 mg total) by mouth at bedtime as needed for sleep.  Indication:  Trouble Sleeping      Follow-up Information    [name] Follow up on 08/06/2018.   Why:  Friday at 8AM for your hospital follow up appointment.  Bring along your hospital d/c paperwork Contact information: [address] [place] [address] [phone]          Follow-up recommendations: Activity:  As tolerated Diet: As recommended by your primary care doctor. Keep all scheduled follow-up appointments as recommended.    Comments: Patient is instructed prior to discharge to: Take all medications as prescribed by his/her mental healthcare provider. Report any adverse effects and or reactions from the medicines to his/her outpatient provider promptly. Patient has been instructed & cautioned: To not engage in alcohol and or illegal drug use while on prescription medicines. In the event of worsening symptoms, patient is instructed to call the crisis hotline, 911 and or go to the nearest ED for appropriate evaluation and treatment of symptoms. To follow-up with his/her primary care provider for your other medical issues, concerns and or health care needs.   Signed: Armandina StammerAgnes Nwoko, NP, PMHNP, FNP-BC 08/06/2018, 10:21 AM   Patient seen, Suicide Assessment Completed.  Disposition Plan Reviewed

## 2018-08-16 ENCOUNTER — Telehealth: Payer: Self-pay | Admitting: Family

## 2018-08-16 ENCOUNTER — Encounter: Payer: Self-pay | Admitting: Physician Assistant

## 2018-08-16 NOTE — Telephone Encounter (Signed)
You can squeeze him in at 1:40 on Friday if he is able to come then.

## 2018-08-16 NOTE — Telephone Encounter (Signed)
Called and spoke to the pt about scheduling a hospital follow up. Informed the patient that the first available appt was 08/25/18. Pt stated that he would prefer to be seen this week. I spoke with Nicki Guadalajararicia and she advised me to put him in on 08/25/18 and speak with Melissa about possibly squeezing him in. When I came back to the phone and let him know what had been advised, the pt hung up on me. I attempted to call him back with no answer.  Please advise.

## 2018-08-16 NOTE — Telephone Encounter (Signed)
Could you please contact pt to arrange a post-hospital follow up?

## 2018-08-16 NOTE — Telephone Encounter (Signed)
Spoke with pt. Only slot available on Friday is at 1:20. Appt scheduled and pt is agreeable.

## 2018-08-20 ENCOUNTER — Encounter: Payer: Self-pay | Admitting: Family

## 2018-08-20 ENCOUNTER — Ambulatory Visit (INDEPENDENT_AMBULATORY_CARE_PROVIDER_SITE_OTHER): Payer: Medicare HMO | Admitting: Family

## 2018-08-20 VITALS — BP 149/104 | HR 115 | Temp 98.6°F | Resp 20 | Ht 65.0 in | Wt 228.0 lb

## 2018-08-20 DIAGNOSIS — K219 Gastro-esophageal reflux disease without esophagitis: Secondary | ICD-10-CM

## 2018-08-20 DIAGNOSIS — M5416 Radiculopathy, lumbar region: Secondary | ICD-10-CM | POA: Diagnosis not present

## 2018-08-20 DIAGNOSIS — F200 Paranoid schizophrenia: Secondary | ICD-10-CM

## 2018-08-20 DIAGNOSIS — I10 Essential (primary) hypertension: Secondary | ICD-10-CM

## 2018-08-20 MED ORDER — MELOXICAM 7.5 MG PO TABS: 7.5000 mg | ORAL_TABLET | Freq: Every day | ORAL | 0 refills | Status: DC

## 2018-08-20 MED ORDER — PANTOPRAZOLE SODIUM 40 MG PO TBEC: 40.0000 mg | DELAYED_RELEASE_TABLET | Freq: Every day | ORAL | 5 refills | Status: DC

## 2018-08-20 MED ORDER — AMLODIPINE BESYLATE 5 MG PO TABS: 5.0000 mg | ORAL_TABLET | Freq: Every day | ORAL | 3 refills | Status: DC

## 2018-08-20 NOTE — Progress Notes (Signed)
Subjective:    Patient ID: Lucas Knapp, male    DOB: 1986-10-21, 32 y.o.   MRN: [id number]  HPI   Patient is a 32 yr old male with history of schizophrenia who presents today for follow up. I last saw the patient on June 28, 2018.  At that time he was complaining of some back pain, but was ultimately sent to the emergency department due to paranoia.  The emergency department evaluated the patient and he was ultimately admitted to inpatient behavioral health.  I do not have access to these outpatient behavioral health records.  He returned to the emergency department on 07/27/2018 and was noted again to be paranoid.  He was again admitted to behavioral health (discharge summary reviewed in Epic) and was treated with Haldol and transitioned to long-acting injectable Haldol.  Discharged home on 08/06/2018 with plans for outpatient follow-up.   He is followed at the [facility] center for follow up.  Reports good compliance since discharge.    Low back pain- has low back pain and some migraine/tension headaches.  GERD- reports   BP Readings from Last 3 Encounters:  08/20/18 (!) 149/104  08/06/18 (!) 134/97  07/27/18 133/89         Review of Systems See HPI  Past Medical History:  Diagnosis Date  . Back pain   . Bipolar 1 disorder (HCC)   . Hyperlipidemia   . Hypertension    pt has been prescribed HCTZ 12.5 mg daily. Pt was 140/80 on admission.   . Schizophrenia [facility])      Social History   Socioeconomic History  . Marital status: Single    Spouse name: Not on file  . Number of children: Not on file  . Years of education: Not on file  . Highest education level: Not on file  Occupational History  . Not on file  Social Needs  . Financial resource strain: Not on file  . Food insecurity:    Worry: Not on file    Inability: Not on file  . Transportation needs:    Medical: Not on file    Non-medical: Not on file  Tobacco Use  . Smoking status: Current Every Day Smoker   Types: Cigarettes  . Smokeless tobacco: Never Used  . Tobacco comment: 10-15  Substance and Sexual Activity  . Alcohol use: Yes    Alcohol/week: 0.0 standard drinks    Comment: occasionally --"I need to drink more"  . Drug use: Yes    Types: Marijuana    Comment: Pt endorsed used of marijuana  . Sexual activity: Not on file  Lifestyle  . Physical activity:    Days per week: Not on file    Minutes per session: Not on file  . Stress: Not on file  Relationships  . Social connections:    Talks on phone: Not on file    Gets together: Not on file    Attends religious service: Not on file    Active member of club or organization: Not on file    Attends meetings of clubs or organizations: Not on file    Relationship status: Not on file  . Intimate partner violence:    Fear of current or ex partner: Not on file    Emotionally abused: Not on file    Physically abused: Not on file    Forced sexual activity: Not on file  Other Topics Concern  . 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 Also works for the [employer] one day a week.    Single   No children   Completed college (bachelors in Statistician)   Enjoys swimming, playing pool, walking   Grew up [place].  Has identical twin and 2 other brothers in [place]    No past surgical history on file.  Family History  Problem Relation Age of Onset  . Hypertension Father   . Hyperlipidemia Father   . Hypertension Paternal Grandmother   . Hypertension Paternal Grandfather   . Hypertension Brother        identical twin  . Psychosis Brother        "mental issues"      Allergies  Allergen Reactions  . Geodon [Ziprasidone Hcl] Other (See Comments)    Reaction:  Made pts throat dry     Current Outpatient Medications on File Prior to Visit  Medication Sig Dispense Refill  . benztropine (COGENTIN) 0.5 MG tablet Take 1 tablet (0.5 mg total) by mouth daily. For prevention  of drug induced tremors 30 tablet 0  . haloperidol (HALDOL) 5 MG tablet Take 1 tablet (5 mg total) by mouth 2 (two) times daily. For mood control 60 tablet 0  . [START ON 08/27/2018] haloperidol decanoate (HALDOL DECANOATE) 100 MG/ML injection Inject 1 mL (100 mg total) into the muscle every 28 (twenty-eight) days. (Due on 08-27-18): For mood control 1 mL 0  . hydrochlorothiazide (MICROZIDE) 12.5 MG capsule Take 1 capsule (12.5 mg total) by mouth daily. For high blood pressure 7 capsule 0  . hydrOXYzine (ATARAX/VISTARIL) 25 MG tablet Take 1 tablet (25 mg total) by mouth 3 (three) times daily as needed for anxiety. 60 tablet 0  . nicotine (NICODERM CQ - DOSED IN MG/24 HOURS) 21 mg/24hr patch Place 1 patch (21 mg total) onto the skin daily as needed (smoking cessation). (May buy from over the counter): For smoking cessation 28 patch 0  . pravastatin (PRAVACHOL) 20 MG tablet Take 1 tablet (20 mg total) by mouth daily at 6 PM. For high cholesterol 10 tablet 0  . traZODone (DESYREL) 100 MG tablet Take 1 tablet (100 mg total) by mouth at bedtime as needed for sleep. 30 tablet 0   No current facility-administered medications on file prior to visit.     BP (!) 149/104 (BP Location: Right Arm, Cuff Size: Large)   Pulse (!) 115   Temp 98.6 F (37 C) (Oral)   Resp 20   Ht 5\' 5"  (1.651 m)   Wt 228 lb (103.4 kg)   SpO2 99%   BMI 37.94 kg/m       Objective:   Physical Exam  Constitutional: He is oriented to person, place, and time. He appears well-developed and well-nourished. No distress.  HENT:  Head: Normocephalic and atraumatic.  Cardiovascular: Normal rate and regular rhythm.  No murmur heard. Pulmonary/Chest: Effort normal and breath sounds normal. No respiratory distress. He has no wheezes. He has no rales.  Musculoskeletal: He exhibits no edema.       Cervical back: He exhibits no tenderness.       Thoracic back: He exhibits no tenderness.       Lumbar back: He exhibits no tenderness.    Neurological: He is alert and oriented to person, place, and time.  Reflex Scores:      Patellar reflexes are 2+ on the right side and 2+ on the left side. Bilateral LE strength 5/5  Skin: Skin is warm and dry.  Psychiatric: He has a normal mood and affect. His behavior is normal. Thought content normal.          Assessment & Plan:  Sciatica/low back pain- refer for PT. If symptoms worsen or fail to improve plan MRI. Trial of meloxicam.  GERD-he is currently on Protonix 80 mg once daily.  I have advised him to decrease this to 40 mg once daily.  We discussed reflux precautions encourage diet modification.  Schizophrenia- appears more stable today.  He has upcoming follow-up with his psychiatrist.  He reports good compliance with his medication.  He is also planning to establish with a counselor.  Hypertension-blood pressure remains elevated despite HCTZ 12.5 mg.I have advised the patient to discontinue HCTZ and instead begin amlodipine 5 mg once daily.

## 2018-08-20 NOTE — Patient Instructions (Addendum)
Change protonix from 80mg  once daily to 40mg  once daily. Stop hctz, start amlodipine 5mg  once daily for blood pressure. Begin meloxicam once daily as needed for back pain. Work on reflux diet as below to help with your reflux symptoms. You should be contacted about your referral to physical therapy.    Food Choices for Gastroesophageal Reflux Disease, Adult When you have gastroesophageal reflux disease (GERD), the foods you eat and your eating habits are very important. Choosing the right foods can help ease your discomfort. What guidelines do I need to follow?  Choose fruits, vegetables, whole grains, and low-fat dairy products.  Choose low-fat meat, fish, and poultry.  Limit fats such as oils, salad dressings, butter, nuts, and avocado.  Keep a food diary. This helps you identify foods that cause symptoms.  Avoid foods that cause symptoms. These may be different for everyone.  Eat small meals often instead of 3 large meals a day.  Eat your meals slowly, in a place where you are relaxed.  Limit fried foods.  Cook foods using methods other than frying.  Avoid drinking alcohol.  Avoid drinking large amounts of liquids with your meals.  Avoid bending over or lying down until 2-3 hours after eating. What foods are not recommended? These are some foods and drinks that may make your symptoms worse: Vegetables Tomatoes. Tomato juice. Tomato and spaghetti sauce. Chili peppers. Onion and garlic. Horseradish. Fruits Oranges, grapefruit, and lemon (fruit and juice). Meats High-fat meats, fish, and poultry. This includes hot dogs, ribs, ham, sausage, salami, and bacon. Dairy Whole milk and chocolate milk. Sour cream. Cream. Butter. Ice cream. Cream cheese. Drinks Coffee and tea. Bubbly (carbonated) drinks or energy drinks. Condiments Hot sauce. Barbecue sauce. Sweets/Desserts Chocolate and cocoa. Donuts. Peppermint and spearmint. Fats and Oils High-fat foods. This includes  [place] fries and potato chips. Other Vinegar. Strong spices. This includes black pepper, white pepper, red pepper, cayenne, curry powder, cloves, ginger, and chili powder. The items listed above may not be a complete list of foods and drinks to avoid. Contact your dietitian for more information. This information is not intended to replace advice given to you by your health care provider. Make sure you discuss any questions you have with your health care provider. Document Released: 06/08/2012 Document Revised: 05/15/2016 Document Reviewed: 10/12/2013 Elsevier Interactive Patient Education  2017 [employer]

## 2018-09-01 ENCOUNTER — Ambulatory Visit: Payer: Self-pay | Admitting: Physician Assistant

## 2018-09-06 ENCOUNTER — Ambulatory Visit: Payer: Self-pay | Admitting: Physician Assistant

## 2018-09-07 ENCOUNTER — Ambulatory Visit: Payer: Medicare HMO | Admitting: Physical Therapy

## 2018-09-17 ENCOUNTER — Ambulatory Visit: Payer: Medicare HMO | Admitting: Family

## 2018-09-21 ENCOUNTER — Ambulatory Visit: Payer: Medicare HMO | Attending: Family | Admitting: Physical Therapy

## 2018-09-21 ENCOUNTER — Other Ambulatory Visit: Payer: Self-pay

## 2018-09-21 ENCOUNTER — Encounter: Payer: Self-pay | Admitting: Physical Therapy

## 2018-09-21 DIAGNOSIS — M5416 Radiculopathy, lumbar region: Secondary | ICD-10-CM | POA: Diagnosis not present
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
## 2018-09-22 ENCOUNTER — Ambulatory Visit (INDEPENDENT_AMBULATORY_CARE_PROVIDER_SITE_OTHER): Payer: Medicare HMO | Admitting: Physician Assistant

## 2018-09-22 ENCOUNTER — Encounter: Payer: Self-pay | Admitting: Physician Assistant

## 2018-09-22 VITALS — BP 108/66 | HR 67 | Ht 64.0 in | Wt 219.4 lb

## 2018-09-22 DIAGNOSIS — K219 Gastro-esophageal reflux disease without esophagitis: Secondary | ICD-10-CM | POA: Diagnosis not present

## 2018-09-22 DIAGNOSIS — R1310 Dysphagia, unspecified: Secondary | ICD-10-CM

## 2018-09-22 MED ORDER — PANTOPRAZOLE SODIUM 40 MG PO TBEC: 40.0000 mg | DELAYED_RELEASE_TABLET | Freq: Every day | ORAL | 3 refills | Status: DC

## 2018-09-22 MED ORDER — PANTOPRAZOLE SODIUM 40 MG PO TBEC: 40.0000 mg | DELAYED_RELEASE_TABLET | Freq: Two times a day (BID) | ORAL | 3 refills | Status: DC

## 2018-09-22 NOTE — Progress Notes (Signed)
Chief Complaint: Dysphagia  HPI:    Lucas Knapp is a 32 year old African-American male with a past medical history as listed below including schizophrenia, who was referred to me by Sandford Craze, NP for a complaint of dysphagia.      Of note it is clear that at the time of patient's appointment he is under the influence of marijuana.      Today, explains that over the past month and a half or so he has had problems with food feeling like it sticks on the left side of his throat and it will "come back up".  This has been occurring off and on.  Some better over the past 2-3 weeks since being started on Pantoprazole 40 mg once daily.  Does admit to continuing with occasional reflux.  Very poor historian.    Denies fever, chills, weight loss, abdominal pain or change in bowel habits.  Past Medical History:  Diagnosis Date  . Back pain   . Bipolar 1 disorder (HCC)   . Hyperlipidemia   . Hypertension    pt has been prescribed HCTZ 12.5 mg daily. Pt was 140/80 on admission.   . Schizophrenia (HCC)     History reviewed. No pertinent surgical history.  Current Outpatient Medications  Medication Sig Dispense Refill  . amLODipine (NORVASC) 5 MG tablet Take 1 tablet (5 mg total) by mouth daily. 30 tablet 3  . benztropine (COGENTIN) 0.5 MG tablet Take 1 tablet (0.5 mg total) by mouth daily. For prevention of drug induced tremors 30 tablet 0  . haloperidol (HALDOL) 5 MG tablet Take 1 tablet (5 mg total) by mouth 2 (two) times daily. For mood control 60 tablet 0  . hydrOXYzine (ATARAX/VISTARIL) 25 MG tablet Take 1 tablet (25 mg total) by mouth 3 (three) times daily as needed for anxiety. 60 tablet 0  . meloxicam (MOBIC) 7.5 MG tablet Take 1 tablet (7.5 mg total) by mouth daily. 14 tablet 0  . pravastatin (PRAVACHOL) 20 MG tablet Take 1 tablet (20 mg total) by mouth daily at 6 PM. For high cholesterol 10 tablet 0  . traZODone (DESYREL) 100 MG tablet Take 1 tablet (100 mg total) by mouth at  bedtime as needed for sleep. 30 tablet 0  . pantoprazole (PROTONIX) 40 MG tablet Take 1 tablet (40 mg total) by mouth 2 (two) times daily before a meal. 60 tablet 3   No current facility-administered medications for this visit.     Allergies as of 09/22/2018 - Review Complete 09/22/2018  Allergen Reaction Noted  . Geodon [ziprasidone hcl] Other (See Comments) 09/17/2012    Family History  Problem Relation Age of Onset  . Hypertension Father   . Hyperlipidemia Father   . Hypertension Paternal Grandmother   . Hypertension Paternal Grandfather   . Hypertension Brother        identical twin  . Psychosis Brother        "mental issues"      Social History   Socioeconomic History  . Marital status: Single    Spouse name: Not on file  . Number of children: Not on file  . Years of education: Not on file  . Highest education level: Not on file  Occupational History  . Not on file  Social Needs  . Financial resource strain: Not on file  . Food insecurity:    Worry: Not on file    Inability: Not on file  . Transportation needs:    Medical: Not on file  Non-medical: Not on file  Tobacco Use  . Smoking status: Current Every Day Smoker    Types: Cigarettes  . Smokeless tobacco: Never Used  . Tobacco comment: 10-15  Substance and Sexual Activity  . Alcohol use: Yes    Alcohol/week: 0.0 standard drinks    Comment: occasionally --"I need to drink more"  . Drug use: Yes    Types: Marijuana    Comment: Pt endorsed used of marijuana  . Sexual activity: Not on file  Lifestyle  . Physical activity:    Days per week: Not on file    Minutes per session: Not on file  . Stress: Not on file  Relationships  . Social connections:    Talks on phone: Not on file    Gets together: Not on file    Attends religious service: Not on file    Active member of club or organization: Not on file    Attends meetings of clubs or organizations: Not on file    Relationship status: Not on file    . Intimate partner violence:    Fear of current or ex partner: Not on file    Emotionally abused: Not on file    Physically abused: Not on file    Forced sexual activity: Not on file  Other Topics Concern  . 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    Single   No children   Completed college (bachelors in Statistician)   Enjoys swimming, playing pool, walking   Grew up [place].  Has identical twin and 2 other brothers in [place]    Review of Systems:    Constitutional: No weight loss, fever or chills Skin: No rash  Cardiovascular: No chest pain   Respiratory: No SOB Gastrointestinal: See HPI and otherwise negative Genitourinary: No dysuria  Neurological: No headache, dizziness or syncope Musculoskeletal: No new muscle or joint pain Hematologic: No bleeding  Psychiatric:+schizophrenia   Physical Exam:  Vital signs: BP 108/66   Pulse 67   Ht 5\' 4"  (1.626 m)   Wt 219 lb 6.4 oz (99.5 kg)   BMI 37.66 kg/m   Constitutional:   Malodorous, Cannabis odor, AA male appears to be in NAD, Well developed, Well nourished, alert and cooperative Head:  Normocephalic and atraumatic. Eyes:   PEERL, EOMI. No icterus. Conjunctiva pink. +dilated pupils Ears:  Normal auditory acuity. Neck:  Supple Throat: Oral cavity and pharynx without inflammation, swelling or lesion.  Respiratory: Respirations even and unlabored. Lungs clear to auscultation bilaterally.   No wheezes, crackles, or rhonchi.  Cardiovascular: Normal S1, S2. No MRG. Regular rate and rhythm. No peripheral edema, cyanosis or pallor.  Gastrointestinal:  Soft, nondistended, nontender. No rebound or guarding. Normal bowel sounds. No appreciable masses or hepatomegaly. Rectal:  Not performed.  Msk:  Symmetrical without gross deformities. Without edema, no deformity or joint abnormality.  Neurologic:  Alert and  oriented x4;   grossly normal neurologically.  Skin:   Dry and intact without significant lesions or rashes. Psychiatric: Subdued  MOST RECENT LABS AND IMAGING: CBC    Component Value Date/Time   WBC 5.9 07/27/2018 0946   RBC 4.87 07/27/2018 0946   HGB 15.1 07/27/2018 0946   HCT 45.0 07/27/2018 0946   PLT 419 (H) 07/27/2018 0946   MCV 92.4 07/27/2018 0946   MCH 31.0 07/27/2018 0946  MCHC 33.6 07/27/2018 0946   RDW 12.7 07/27/2018 0946   LYMPHSABS 4.1 (H) 06/28/2018 1227   MONOABS 0.9 06/28/2018 1227   EOSABS 0.0 06/28/2018 1227   BASOSABS 0.0 06/28/2018 1227    CMP     Component Value Date/Time   NA 140 07/27/2018 0946   K 3.8 07/27/2018 0946   CL 106 07/27/2018 0946   CO2 26 07/27/2018 0946   GLUCOSE 90 07/27/2018 0946   BUN <5 (L) 07/27/2018 0946   CREATININE 0.96 07/27/2018 0946   CALCIUM 9.4 07/27/2018 0946   PROT 7.0 07/27/2018 0946   ALBUMIN 4.3 07/27/2018 0946   AST 49 (H) 07/27/2018 0946   ALT 43 07/27/2018 0946   ALKPHOS 60 07/27/2018 0946   BILITOT 0.8 07/27/2018 0946   GFRNONAA >60 07/27/2018 0946   GFRAA >60 07/27/2018 0946    Assessment: 1.  Dysphagia: Describes food sticking on the left side of his throat off-and-on over the past month some better with Pantoprazole 40 mg daily; consider stricture versus esophageal dysmotility versus other 2.  GERD: Some breakthrough on Pantoprazole 40 mg daily  Plan: 1.  Increased Pantoprazole to 40 mg twice daily, 30-60 minutes before breakfast and dinner, prescribed #60 with 3 refills 2.  Reviewed antireflux diet and lifestyle modifications. 3.  Reviewed anti-dysphagia measures including taking small bites, chewing well and avoiding distraction while eating 4.  Scheduled patient for a barium esophagram with tablet 5.  Patient to follow inc clinic per recommendations after imaging above.  He was assigned to Dr. Adela Lank today.  Hyacinth Meeker, PA-C [facility] 09/22/2018, 1:38 PM  Cc: Sandford Craze,  NP

## 2018-09-22 NOTE — Progress Notes (Signed)
Agree with assessment and plan as outlined.  

## 2018-09-22 NOTE — Patient Instructions (Signed)
Increase Pantoprazole to 40 mg twice a day.   You have been scheduled for a Barium Esophogram at [facility] (1st floor of the hospital) on 09/28/18 at 9:30 am. Please arrive 15 minutes prior to your appointment for registration. Make certain not to have anything to eat or drink 3 hours prior to your test. If you need to reschedule for any reason, please contact radiology at [phone] to do so. __________________________________________________________________ A barium swallow is an examination that concentrates on views of the esophagus. This tends to be a double contrast exam (barium and two liquids which, when combined, create a gas to distend the wall of the oesophagus) or single contrast (non-ionic iodine based). The study is usually tailored to your symptoms so a good history is essential. Attention is paid during the study to the form, structure and configuration of the esophagus, looking for functional disorders (such as aspiration, dysphagia, achalasia, motility and reflux) EXAMINATION You may be asked to change into a gown, depending on the type of swallow being performed. A radiologist and radiographer will perform the procedure. The radiologist will advise you of the type of contrast selected for your procedure and direct you during the exam. You will be asked to stand, sit or lie in several different positions and to hold a small amount of fluid in your mouth before being asked to swallow while the imaging is performed .In some instances you may be asked to swallow barium coated marshmallows to assess the motility of a solid food bolus. The exam can be recorded as a digital or video fluoroscopy procedure. POST PROCEDURE It will take 1-2 days for the barium to pass through your system. To facilitate this, it is important, unless otherwise directed, to increase your fluids for the next 24-48hrs and to resume your normal diet.  This test typically takes about 30 minutes to  perform. __________________________________________________________________________________

## 2018-09-24 ENCOUNTER — Encounter: Payer: Self-pay | Admitting: Family

## 2018-09-24 ENCOUNTER — Ambulatory Visit (INDEPENDENT_AMBULATORY_CARE_PROVIDER_SITE_OTHER): Payer: Medicare HMO | Admitting: Family

## 2018-09-24 ENCOUNTER — Telehealth: Payer: Self-pay

## 2018-09-24 VITALS — BP 136/89 | HR 80 | Temp 98.9°F | Resp 16 | Ht 65.0 in | Wt 221.6 lb

## 2018-09-24 DIAGNOSIS — I10 Essential (primary) hypertension: Secondary | ICD-10-CM | POA: Diagnosis not present

## 2018-09-24 DIAGNOSIS — B9789 Other viral agents as the cause of diseases classified elsewhere: Secondary | ICD-10-CM | POA: Diagnosis not present

## 2018-09-24 DIAGNOSIS — J069 Acute upper respiratory infection, unspecified: Secondary | ICD-10-CM | POA: Diagnosis not present

## 2018-09-24 DIAGNOSIS — F209 Schizophrenia, unspecified: Secondary | ICD-10-CM | POA: Diagnosis not present

## 2018-09-24 DIAGNOSIS — K219 Gastro-esophageal reflux disease without esophagitis: Secondary | ICD-10-CM | POA: Diagnosis not present

## 2018-09-24 DIAGNOSIS — M545 Low back pain, unspecified: Secondary | ICD-10-CM

## 2018-09-24 MED ORDER — BENZONATATE 100 MG PO CAPS: 100.0000 mg | ORAL_CAPSULE | Freq: Three times a day (TID) | ORAL | 0 refills | Status: DC | PRN

## 2018-09-24 MED ORDER — LORATADINE 10 MG PO TABS: 10.0000 mg | ORAL_TABLET | Freq: Every day | ORAL | 11 refills | Status: DC

## 2018-09-24 NOTE — Telephone Encounter (Signed)
Contacted patient to let him know that ins. Approved his Pantoprazole.  Patient verbalized understanding.

## 2018-09-24 NOTE — Progress Notes (Signed)
Subjective:    Patient ID: Lucas Knapp, male    DOB: 07-Dec-1986, 32 y.o.   MRN: [id number]  HPI  Patient is a 32 yr old male who presents today for follow up.  Schizophrenia- has follow up on 10/17.  Reports haldol makes him sleepy. Reports good mood. Denies hallucinations.   Low back pain- last visit we gave him a trial of meloxicam and referred him to PT. Reports that he went to PT last week. He reports that symptoms are much improved.    HTN-  Last visit we discontinued hctz and started amlodipine 5mg .  BP Readings from Last 3 Encounters:  09/24/18 136/89  09/22/18 108/66  08/20/18 (!) 149/104   GERD- had been taking protonix 80mg  day. We discussed gerd diet and decreasing to 40mg  last visit. Reports symptoms stable.   Reports that he developed cough and nasal congestion last weekend. He denies known fever. Denies sore throat. Reports nasal drainage is clear and cough is dry.   Review of Systems See HPI  Past Medical History:  Diagnosis Date  . Back pain   . Bipolar 1 disorder (HCC)   . Hyperlipidemia   . Hypertension    pt has been prescribed HCTZ 12.5 mg daily. Pt was 140/80 on admission.   . Schizophrenia [facility])      Social History   Socioeconomic History  . Marital status: Single    Spouse name: Not on file  . Number of children: Not on file  . Years of education: Not on file  . Highest education level: Not on file  Occupational History  . Not on file  Social Needs  . Financial resource strain: Not on file  . Food insecurity:    Worry: Not on file    Inability: Not on file  . Transportation needs:    Medical: Not on file    Non-medical: Not on file  Tobacco Use  . Smoking status: Current Every Day Smoker    Types: Cigarettes  . Smokeless tobacco: Never Used  . Tobacco comment: 10-15  Substance and Sexual Activity  . Alcohol use: Yes    Alcohol/week: 0.0 standard drinks    Comment: occasionally --"I need to drink more"  . Drug use: Yes   Types: Marijuana    Comment: Pt endorsed used of marijuana  . Sexual activity: Not on file  Lifestyle  . Physical activity:    Days per week: Not on file    Minutes per session: Not on file  . Stress: Not on file  Relationships  . Social connections:    Talks on phone: Not on file    Gets together: Not on file    Attends religious service: Not on file    Active member of club or organization: Not on file    Attends meetings of clubs or organizations: Not on file    Relationship status: Not on file  . Intimate partner violence:    Fear of current or ex partner: Not on file    Emotionally abused: Not on file    Physically abused: Not on file    Forced sexual activity: Not on file  Other Topics Concern  . 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    Single   No children   Completed college (bachelors in Statistician)   Enjoys swimming, playing pool, walking  Grew up [place] [facility].  Has identical twin and 2 other brothers in [place]    No past surgical history on file.  Family History  Problem Relation Age of Onset  . Hypertension Father   . Hyperlipidemia Father   . Hypertension Paternal Grandmother   . Hypertension Paternal Grandfather   . Hypertension Brother        identical twin  . Psychosis Brother        "mental issues"      Allergies  Allergen Reactions  . Geodon [Ziprasidone Hcl] Other (See Comments)    Reaction:  Made pts throat dry     Current Outpatient Medications on File Prior to Visit  Medication Sig Dispense Refill  . amLODipine (NORVASC) 5 MG tablet Take 1 tablet (5 mg total) by mouth daily. 30 tablet 3  . benztropine (COGENTIN) 0.5 MG tablet Take 1 tablet (0.5 mg total) by mouth daily. For prevention of drug induced tremors 30 tablet 0  . haloperidol (HALDOL) 5 MG tablet Take 1 tablet (5 mg total) by mouth 2 (two) times daily. For mood control 60  tablet 0  . hydrOXYzine (ATARAX/VISTARIL) 25 MG tablet Take 1 tablet (25 mg total) by mouth 3 (three) times daily as needed for anxiety. 60 tablet 0  . meloxicam (MOBIC) 7.5 MG tablet Take 1 tablet (7.5 mg total) by mouth daily. 14 tablet 0  . pantoprazole (PROTONIX) 40 MG tablet Take 1 tablet (40 mg total) by mouth 2 (two) times daily before a meal. 60 tablet 3  . pravastatin (PRAVACHOL) 20 MG tablet Take 1 tablet (20 mg total) by mouth daily at 6 PM. For high cholesterol 10 tablet 0  . traZODone (DESYREL) 100 MG tablet Take 1 tablet (100 mg total) by mouth at bedtime as needed for sleep. 30 tablet 0   No current facility-administered medications on file prior to visit.     BP 136/89 (BP Location: Right Arm, Patient Position: Sitting, Cuff Size: Large)   Pulse 80   Temp 98.9 F (37.2 C) (Oral)   Resp 16   Ht 5\' 5"  (1.651 m)   Wt 221 lb 9.6 oz (100.5 kg)   SpO2 96%   BMI 36.88 kg/m       Objective:   Physical Exam  Constitutional: He is oriented to person, place, and time. He appears well-developed and well-nourished. No distress.  HENT:  Head: Normocephalic and atraumatic.  Mouth/Throat: No oropharyngeal exudate, posterior oropharyngeal edema or posterior oropharyngeal erythema.  Cardiovascular: Normal rate and regular rhythm.  No murmur heard. Pulmonary/Chest: Effort normal and breath sounds normal. No respiratory distress. He has no wheezes. He has no rales.  Musculoskeletal: He exhibits no edema.  Neurological: He is alert and oriented to person, place, and time.  Skin: Skin is warm and dry.  Psychiatric: He has a normal mood and affect. His behavior is normal. Thought content normal.          Assessment & Plan:  URI with cough- poor historian but clinical picture appears viral.  Advised patient to add Claritin once daily as well as Tessalon as needed.  He is to let me know if symptoms worsen or if they are not improved by Monday.  Would consider antibiotics at that  time.  Hypertension-blood pressure is improved on amlodipine.  Continue same.  Low back pain-this is improving after use of meloxicam and initiation of physical therapy.  Continue PT.  Schizophrenia-clinically stable-she is advised to keep upcoming appointment with psychiatry.  GERD-stable on decreased dose of Protonix.

## 2018-09-24 NOTE — Patient Instructions (Signed)
Please add claritin once daily and tessalon as needed for cough. Call if symptoms worsen or if not improved by Monday.

## 2018-09-28 ENCOUNTER — Ambulatory Visit (HOSPITAL_COMMUNITY)
Admission: RE | Admit: 2018-09-28 | Discharge: 2018-09-28 | Disposition: A | Discharge status: Home / Self Care | Payer: Medicare HMO | Source: Ambulatory Visit | Attending: Physician Assistant | Admitting: Physician Assistant

## 2018-09-28 DIAGNOSIS — R1310 Dysphagia, unspecified: Secondary | ICD-10-CM | POA: Diagnosis not present

## 2018-10-01 ENCOUNTER — Other Ambulatory Visit: Payer: Self-pay | Admitting: Family

## 2018-10-07 DIAGNOSIS — F250 Schizoaffective disorder, bipolar type: Secondary | ICD-10-CM | POA: Diagnosis not present

## 2018-10-18 ENCOUNTER — Ambulatory Visit: Payer: Medicare HMO | Admitting: Physical Therapy

## 2018-10-21 ENCOUNTER — Encounter: Payer: Self-pay | Admitting: Physical Therapy

## 2018-10-21 ENCOUNTER — Ambulatory Visit: Payer: Medicare HMO | Admitting: Physical Therapy

## 2018-10-21 DIAGNOSIS — M5416 Radiculopathy, lumbar region: Secondary | ICD-10-CM

## 2018-10-21 NOTE — Patient Instructions (Addendum)
Gastroc Stretch    Stand with right foot back, leg straight, forward leg bent. Keeping heel on floor, turned slightly out, lean into wall until stretch is felt in calf. Hold _30___ seconds. Repeat __3__ times per set. Do __1__ sets per session. Do __1_ sessions per day.  Life long exercise.  Avoid back hyperextension, tighten abdominals.   http://orth.exer.us/26   Copyright  VHI. All rights reserved.  Issued from exercise drawer: QL exercises all issued PRN  20-30 seconds 1 to 3 x each

## 2018-10-21 NOTE — Therapy (Signed)
[facility] [address] [place], [place], [address] Phone: [phone]   Fax:  [phone]  Physical Therapy Treatment  Patient Details  Name: Lucas Knapp MRN: [id number] Date of Birth: December 02, 1986 Referring Provider (PT): Sandford Craze , NP    Encounter Date: 10/21/2018  PT End of Session - 10/21/18 0843    Visit Number  2    Number of Visits  12    Date for PT Re-Evaluation  11/05/18    PT Start Time  0750    PT Stop Time  0850    PT Time Calculation (min)  60 min    Activity Tolerance  Patient tolerated treatment well    Behavior During Therapy  [facility] for tasks assessed/performed       Past Medical History:  Diagnosis Date  . Back pain   . Bipolar 1 disorder (HCC)   . Hyperlipidemia   . Hypertension    pt has been prescribed HCTZ 12.5 mg daily. Pt was 140/80 on admission.   . Schizophrenia (HCC)     History reviewed. No pertinent surgical history.  There were no vitals filed for this visit.  Subjective Assessment - 10/21/18 0755    Subjective  Exercises help.  Pain worse Yesterday    Pain Score  6     Pain Location  Back    Pain Orientation  Left;Lower    Pain Radiating Towards  to gluteals,  into groin    Aggravating Factors   sitting for awhile,  pulling laundry cart,  rainey weather    Pain Relieving Factors  heating pad,  laying down,  medication    Multiple Pain Sites  --   shoots into whole left side maybe 3 x a month,  not sure why,  lasts pulsating pain   intermittant.                        [facility] Adult PT Treatment/Exercise - 10/21/18 0001      Self-Care   Self-Care  Posture;ADL's   anatomy,  demo some posture ADL     Lumbar Exercises: Stretches   Active Hamstring Stretch  3 reps;30 seconds   sitting   Single Knee to Chest Stretch  3 reps;30 seconds    Single Knee to Chest Stretch Limitations  tighter left    Hip Flexor Stretch  1 rep;30 seconds   both  min cues  right <  LT tightness   Standing Side Bend  Left    Standing Side Bend Limitations  QL  decreased pain    Quadruped Mid Back Stretch  3 reps    Quadruped Mid Back Stretch Limitations  left QL  2 positions  HEP  feels good    Quad Stretch Limitations  non tight right limited left.    Piriformis Stretch  2 reps;30 seconds   both   Gastroc Stretch  3 reps;30 seconds    Gastroc Stretch Limitations  standing    Other Lumbar Stretch Exercise  McKenzie lateral technique,  left side at wall      Moist Heat Therapy   Number Minutes Moist Heat  15 Minutes    Moist Heat Location  Lumbar Spine             PT Education - 10/21/18 0841    Education Details  anatomy, HEP    Person(s) Educated  Patient    Methods  Explanation;Demonstration;Tactile cues;Verbal cues;Handout    Comprehension  Verbalized understanding;Returned demonstration          PT Long Term Goals - 10/05/18 0917      PT LONG TERM GOAL #1   Title  Patient will be I with HEP    Baseline  given on eval     Time  6    Period  Weeks    Status  New    Target Date  11/05/18      PT LONG TERM GOAL #2   Title  Pt will be able to work , including lifting without pain exacerbation.     Baseline  pain increases with daily work activities, can be     Time  6    Period  Weeks    Status  New    Target Date  11/05/18      PT LONG TERM GOAL #3   Title  Pt will be able to sit comfortably for meals, driving for up to 30 min     Baseline  stifffness, painful >30 min     Time  6    Period  Weeks    Status  New    Target Date  11/05/18      PT LONG TERM GOAL #4   Title  Pt will be able to demo proper lifting techniques to avoid increased pain     Baseline  needs reinforcement     Time  6    Period  Weeks    Status  New    Target Date  11/05/18            Plan - 10/21/18 0843    Clinical Impression Statement  progresswing with HEP and education.  Patient compliant.  Pain reduced with exercise,  flared with calf  stretch with hyperextension lumbar,  better wit heat and single knee to chest.    PT Next Visit Plan  check HEP, flexion based core.  Posture, lifting with simulation (laundry bag)     PT Home Exercise Plan  post pelvic tilt, hip flexor stretch, hamstring stretch and knee to chest All QL stretches,  calf stretch    Consulted and Agree with Plan of Care  Patient       Patient will benefit from skilled therapeutic intervention in order to improve the following deficits and impairments:     Visit Diagnosis: Radiculopathy, lumbar region     Problem List Patient Active Problem List   Diagnosis Date Noted  . Preventative health care 09/29/2016  . HTN (hypertension) 02/18/2016  . Hyperlipidemia 02/18/2016  . Fatigue 02/18/2016  . Low back pain 02/18/2016  . Abdominal pain 11/27/2014  . Schizophrenia, paranoid type (HCC) 09/17/2012    Class: Acute  . Cannabis abuse 09/17/2012    Lucas Knapp PTA 10/21/2018, 8:46 AM  [facility] [address] [place], [place], [address] Phone: [phone]   Fax:  [phone]  Name: Lucas Knapp MRN: [id number] Date of Birth: 16-Jan-1986

## 2018-10-25 ENCOUNTER — Ambulatory Visit: Payer: Medicare HMO | Admitting: Physical Therapy

## 2018-10-27 ENCOUNTER — Ambulatory Visit: Payer: Medicare HMO | Admitting: Physical Therapy

## 2018-10-28 ENCOUNTER — Encounter

## 2018-10-28 ENCOUNTER — Encounter: Payer: Self-pay | Admitting: Physical Therapy

## 2018-11-01 ENCOUNTER — Encounter: Payer: Self-pay | Admitting: Physical Therapy

## 2018-11-01 ENCOUNTER — Ambulatory Visit: Payer: Medicare HMO | Attending: Family | Admitting: Physical Therapy

## 2018-11-01 DIAGNOSIS — M5416 Radiculopathy, lumbar region: Secondary | ICD-10-CM | POA: Diagnosis not present

## 2018-11-01 NOTE — Therapy (Signed)
[facility] [address] [place], [place], [address] Phone: [phone]   Fax:  [phone]  Physical Therapy Treatment  Patient Details  Name: Lucas Knapp MRN: [id number] Date of Birth: 09/18/1986 Referring Provider (PT): Sandford Craze , NP    Encounter Date: 11/01/2018  PT End of Session - 11/01/18 0857    Visit Number  3    Number of Visits  12    Date for PT Re-Evaluation  11/05/18    PT Start Time  0854   pt late    PT Stop Time  0938    PT Time Calculation (min)  44 min    Activity Tolerance  Patient tolerated treatment well    Behavior During Therapy  [facility] for tasks assessed/performed       Past Medical History:  Diagnosis Date  . Back pain   . Bipolar 1 disorder (HCC)   . Hyperlipidemia   . Hypertension    pt has been prescribed HCTZ 12.5 mg daily. Pt was 140/80 on admission.   . Schizophrenia (HCC)     History reviewed. No pertinent surgical history.  There were no vitals filed for this visit.  Subjective Assessment - 11/01/18 0854    Subjective  The exercises are helping.  I have pain L side back and posterior leg.  Worse with standing and sitting too long.     Currently in Pain?  Yes    Pain Score  6     Pain Location  Back    Pain Orientation  Left;Mid    Pain Descriptors / Indicators  Radiating;Aching    Pain Type  Chronic pain    Pain Onset  More than a month ago    Pain Frequency  Intermittent    Aggravating Factors   sitting , standing , pushing     Pain Relieving Factors  heat, laying down,meds                [facility] Adult PT Treatment/Exercise - 11/01/18 0001      Lumbar Exercises: Stretches   Active Hamstring Stretch  2 reps;30 seconds    Single Knee to Chest Stretch  3 reps;30 seconds    Lower Trunk Rotation  3 reps;30 seconds    Pelvic Tilt  10 reps    Pelvic Tilt Limitations  exhale to tilt     Piriformis Stretch  2 reps;30 seconds   both   Gastroc Stretch  3  reps;30 seconds    Gastroc Stretch Limitations  standing      Lumbar Exercises: Aerobic   Nustep  5 min LE only L 5       Lumbar Exercises: Sidelying   Other Sidelying Lumbar Exercises  QL stretch      Moist Heat Therapy   Number Minutes Moist Heat  10 Minutes    Moist Heat Location  Lumbar Spine      Manual Therapy   Manual therapy comments  LT QL, MFR in sidelying trigger points along lateral QL                   PT Long Term Goals - 11/01/18 0929      PT LONG TERM GOAL #1   Title  Patient will be I with HEP    Status  On-going      PT LONG TERM GOAL #2   Title  Pt will be able to work , including lifting without pain exacerbation.  Status  On-going      PT LONG TERM GOAL #3   Title  Pt will be able to sit comfortably for meals, driving for up to 30 min     Status  On-going      PT LONG TERM GOAL #4   Title  Pt will be able to demo proper lifting techniques to avoid increased pain     Status  On-going            Plan - 11/01/18 0905    Clinical Impression Statement  Pt I with HEP thus far.  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     PT Treatment/Interventions  ADLs/Self Care Home Management;Electrical Stimulation;Functional mobility training;Neuromuscular re-education;Cryotherapy;Ultrasound;Traction;Moist Heat;Therapeutic exercise;Therapeutic activities;Patient/family education;Manual techniques;Passive range of motion    PT Next Visit Plan   flexion based core.  Posture, lifting with simulation (laundry bag)     PT Home Exercise Plan  post pelvic tilt, hip flexor stretch, hamstring stretch and knee to chest All QL stretches,  calf stretch    Consulted and Agree with Plan of Care  Patient       Patient will benefit from skilled therapeutic intervention in order to improve the following deficits and impairments:  Pain, Impaired flexibility, Increased  fascial restricitons, Decreased strength, Decreased range of motion, Decreased mobility, Hypomobility  Visit Diagnosis: Radiculopathy, lumbar region     Problem List Patient Active Problem List   Diagnosis Date Noted  . Preventative health care 09/29/2016  . HTN (hypertension) 02/18/2016  . Hyperlipidemia 02/18/2016  . Fatigue 02/18/2016  . Low back pain 02/18/2016  . Abdominal pain 11/27/2014  . Schizophrenia, paranoid type (HCC) 09/17/2012    Class: Acute  . Cannabis abuse 09/17/2012    Rettie Laird 11/01/2018, 10:04 AM  [facility] [address] [place], [place], [address] Phone: [phone]   Fax:  [phone]  Name: Lucas Knapp MRN: [id number] Date of Birth: 1986/12/01   Karie Mainland, PT 11/01/18 10:04 AM Phone: [phone] Fax: [phone]

## 2018-11-04 ENCOUNTER — Ambulatory Visit: Payer: Medicare HMO | Admitting: Physical Therapy

## 2018-11-04 ENCOUNTER — Encounter: Payer: Self-pay | Admitting: Physical Therapy

## 2018-11-04 DIAGNOSIS — M5416 Radiculopathy, lumbar region: Secondary | ICD-10-CM

## 2018-11-04 NOTE — Therapy (Signed)
[place], [place], [address] Phone: [phone]   Fax:  [phone]  Physical Therapy Treatment  Patient Details  Name: Lucas Knapp MRN: [id number] Date of Birth: [birth year] Referring Provider (PT): Debbrah Alar , NP    Encounter Date: 11/04/2018  PT End of Session - 11/04/18 1554    Visit Number  4    Number of Visits  16    Date for PT Re-Evaluation  12/17/18    PT Start Time  4315    PT Stop Time  1630    PT Time Calculation (min)  45 min    Activity Tolerance  Patient tolerated treatment well    Behavior During Therapy  [facility] for tasks assessed/performed       Past Medical History:  Diagnosis Date  . Back pain   . Bipolar 1 disorder ([place])   . Hyperlipidemia   . Hypertension    pt has been prescribed HCTZ 12.5 mg daily. Pt was 140/80 on admission.   . Schizophrenia ([place])     History reviewed. No pertinent surgical history.  There were no vitals filed for this visit.  Subjective Assessment - 11/04/18 1552    Subjective  Those stretches helped last time.  I felt increased pain in my L upper back today.  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  Pain in uppoer back is gone.  Lower back is cracking when I walk.     Currently in Pain?  Yes    Pain Score  8     Pain Location  Back    Pain Orientation  Left    Pain Descriptors / Indicators  Aching    Pain Type  Chronic pain    Pain Onset  More than a month ago    Pain Frequency  Intermittent         [facility] PT Assessment - 11/04/18 0001      Assessment   Medical Diagnosis  Lumbar radiculopathy L     Referring Provider (PT)  Debbrah Alar , NP     Onset Date/Surgical Date  --   chronic    Next MD Visit  unknown    Prior Therapy  had some 2005       Precautions   Precautions  None      Restrictions   Weight Bearing Restrictions  No      Balance Screen   Has the patient fallen in the past 6 months  No       [place] residence    Living Arrangements  Other relatives    Type of Home  Apartment      Prior Function   Level of Independence  Independent    Vocation  Part time employment    Vocation Requirements  ;laundry room     Leisure  plays pool, ;likes to swim       Cognition   Overall Cognitive Status  Within Functional Limits for tasks assessed      Strength   Right Hip Extension  4+/5    Right Hip ABduction  4+/5    Left Hip Flexion  --    Left Hip Extension  4+/5   back pain         [facility] Adult PT Treatment/Exercise - 11/04/18 0001      Lumbar Exercises: Stretches   Active Hamstring Stretch  2 reps;30 seconds  Single Knee to Chest Stretch  3 reps;30 seconds    Pelvic Tilt  10 reps    Pelvic Tilt Limitations  exhale to tilt       Lumbar Exercises: Standing   Wall Slides  10 reps    Wall Slides Limitations  used ball and wall , cues to avoid hyperextension of L spine     Row  Strengthening;Both;10 reps    Theraband Level (Row)  Level 4 (Blue)    Other Standing Lumbar Exercises  Paloff press blue x 10 and rotation x 10       Lumbar Exercises: Supine   Clam  10 reps    Bent Knee Raise  10 reps    Bridge  10 reps    Straight Leg Raise  10 reps    Straight Leg Raises Limitations  core focus     Large Ball Abdominal Isometric  10 reps      Lumbar Exercises: Sidelying   Hip Abduction  10 reps    Other Sidelying Lumbar Exercises  QL stretch      Lumbar Exercises: Quadruped   Other Quadruped Lumbar Exercises  childs pose FW x 2, ;lateral x 1 each       Moist Heat Therapy   Number Minutes Moist Heat  10 Minutes    Moist Heat Location  Lumbar Spine             PT Education - 11/04/18 1558    Education Details  Renewal, core     Person(s) Educated  Patient    Methods  Explanation    Comprehension  Verbalized understanding          PT Long Term Goals - 11/04/18 1558      PT LONG TERM GOAL #1   Title   Patient will be I with HEP    Status  On-going      PT LONG TERM GOAL #2   Title  Pt will be able to work , including lifting without pain exacerbation.     Status  On-going      PT LONG TERM GOAL #3   Title  Pt will be able to sit comfortably for meals, driving for up to 30 min     Baseline  can do 30 but beyond 45-60 min has to get up     Status  Partially Met      PT LONG TERM GOAL #4   Title  Pt will be able to demo proper lifting techniques to avoid increased pain     Status  On-going      PT LONG TERM GOAL #5   Title  Pt will no longer have difficulty sleeping due to pain.    Time  6    Period  Weeks    Status  New    Target Date  12/17/18      PT LONG TERM GOAL #6   Title  Pt will have no more LE pain (radicular symptoms) in LLE     Time  6    Period  Weeks    Status  New    Target Date  12/17/18            Plan - 11/04/18 1622    Clinical Impression Statement  Pt is benefitting from PT .  Has used only 4 visits.  Feeling better overall but continues to have pain with lifting, squatting at work. Able to relieve pain with mat stretches, will  focus more on core over the next few weeks.      PT Treatment/Interventions  ADLs/Self Care Home Management;Electrical Stimulation;Functional mobility training;Neuromuscular re-education;Cryotherapy;Ultrasound;Traction;Moist Heat;Therapeutic exercise;Therapeutic activities;Patient/family education;Manual techniques;Passive range of motion    PT Next Visit Plan   flexion based core.  Posture, lifting with simulation (laundry bag)     PT Home Exercise Plan  post pelvic tilt, hip flexor stretch, hamstring stretch and knee to chest All QL stretches,  calf stretch    Consulted and Agree with Plan of Care  Patient       Patient will benefit from skilled therapeutic intervention in order to improve the following deficits and impairments:  Pain, Impaired flexibility, Increased fascial restricitons, Decreased strength, Decreased range  of motion, Decreased mobility, Hypomobility  Visit Diagnosis: Radiculopathy, lumbar region     Problem List Patient Active Problem List   Diagnosis Date Noted  . Preventative health care 09/29/2016  . HTN (hypertension) 02/18/2016  . Hyperlipidemia 02/18/2016  . Fatigue 02/18/2016  . Low back pain 02/18/2016  . Abdominal pain 11/27/2014  . Schizophrenia, paranoid type ([place]) 09/17/2012    Class: Acute  . Cannabis abuse 09/17/2012    Chianti Goh 11/04/2018, 4:24 PM  [facility] [address] [place], [place], [address] Phone: [phone]   Fax:  [phone]  Name: Akshath Mccarey MRN: [id number] Date of Birth: [birth year]   Raeford Razor, PT 11/04/18 4:24 PM Phone: [phone] Fax: [phone]

## 2018-11-05 ENCOUNTER — Ambulatory Visit (INDEPENDENT_AMBULATORY_CARE_PROVIDER_SITE_OTHER): Payer: Medicare HMO | Admitting: Family

## 2018-11-05 ENCOUNTER — Encounter: Payer: Self-pay | Admitting: Family

## 2018-11-05 VITALS — BP 118/83 | HR 80 | Temp 98.2°F | Resp 16 | Ht 65.0 in | Wt 220.0 lb

## 2018-11-05 DIAGNOSIS — B351 Tinea unguium: Secondary | ICD-10-CM | POA: Diagnosis not present

## 2018-11-05 DIAGNOSIS — L03032 Cellulitis of left toe: Secondary | ICD-10-CM | POA: Diagnosis not present

## 2018-11-05 DIAGNOSIS — Z23 Encounter for immunization: Secondary | ICD-10-CM | POA: Diagnosis not present

## 2018-11-05 MED ORDER — CEPHALEXIN 500 MG PO CAPS: 500.0000 mg | ORAL_CAPSULE | Freq: Three times a day (TID) | ORAL | 0 refills | Status: DC

## 2018-11-05 NOTE — Patient Instructions (Signed)
Please begin keflex (antibiotic) for your toe. Call if you develop increased pain/swelling or redness. You should be contacted about your referral to the foot doctor.

## 2018-11-05 NOTE — Progress Notes (Signed)
Subjective:     Patient ID: Lucas Knapp, male   DOB: 08/23/1986, 32 y.o.   MRN: [id number]  HPI  [name] is a 32 yr old male who presents today with c/o darkening/pain of the left great toenail.   Review of Systems    see HPI  Past Medical History:  Diagnosis Date  . Back pain   . Bipolar 1 disorder (HCC)   . Hyperlipidemia   . Hypertension    pt has been prescribed HCTZ 12.5 mg daily. Pt was 140/80 on admission.   . Schizophrenia [facility](HCC)      Social History   Socioeconomic History  . Marital status: Single    Spouse name: Not on file  . Number of children: Not on file  . Years of education: Not on file  . Highest education level: Not on file  Occupational History  . Not on file  Social Needs  . Financial resource strain: Not on file  . Food insecurity:    Worry: Not on file    Inability: Not on file  . Transportation needs:    Medical: Not on file    Non-medical: Not on file  Tobacco Use  . Smoking status: Current Every Day Smoker    Types: Cigarettes  . Smokeless tobacco: Never Used  . Tobacco comment: 10-15  Substance and Sexual Activity  . Alcohol use: Yes    Alcohol/week: 0.0 standard drinks    Comment: occasionally --"I need to drink more"  . Drug use: Yes    Types: Marijuana    Comment: Pt endorsed used of marijuana  . Sexual activity: Not on file  Lifestyle  . Physical activity:    Days per week: Not on file    Minutes per session: Not on file  . Stress: Not on file  Relationships  . Social connections:    Talks on phone: Not on file    Gets together: Not on file    Attends religious service: Not on file    Active member of club or organization: Not on file    Attends meetings of clubs or organizations: Not on file    Relationship status: Not on file  . Intimate partner violence:    Fear of current or ex partner: Not on file    Emotionally abused: Not on file    Physically abused: Not on file    Forced sexual activity: Not on file    Other Topics Concern  . 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 Also works for the [employer] one day a week.    Single   No children   Completed college (bachelors in Statisticianlectronic technology)   Enjoys swimming, playing pool, walking   [place]  Has identical twin and 2 other brothers in [place]    No past surgical history on file.  Family History  Problem Relation Age of Onset  . Hypertension Father   . Hyperlipidemia Father   . Hypertension Paternal Grandmother   . Hypertension Paternal Grandfather   . Hypertension Brother        identical twin  . Psychosis Brother        "mental issues"      Allergies  Allergen Reactions  . Geodon [Ziprasidone Hcl] Other (See Comments)    Reaction:  Made pts throat dry     Current Outpatient Medications on File Prior to Visit  Medication Sig Dispense Refill  . amLODipine (NORVASC) 5 MG tablet Take 1 tablet (5 mg total) by mouth daily. 30 tablet 3  . benzonatate (TESSALON) 100 MG capsule Take 1 capsule (100 mg total) by mouth 3 (three) times daily as needed. 20 capsule 0  . benztropine (COGENTIN) 0.5 MG tablet Take 1 tablet (0.5 mg total) by mouth daily. For prevention of drug induced tremors 30 tablet 0  . haloperidol (HALDOL) 5 MG tablet Take 1 tablet (5 mg total) by mouth 2 (two) times daily. For mood control 60 tablet 0  . hydrOXYzine (ATARAX/VISTARIL) 25 MG tablet Take 1 tablet (25 mg total) by mouth 3 (three) times daily as needed for anxiety. 60 tablet 0  . loratadine (CLARITIN) 10 MG tablet Take 1 tablet (10 mg total) by mouth daily. 30 tablet 11  . lovastatin (MEVACOR) 20 MG tablet TAKE 1 TABLET (20 MG TOTAL) BY MOUTH AT BEDTIME. 90 tablet 0  . meloxicam (MOBIC) 7.5 MG tablet Take 1 tablet (7.5 mg total) by mouth daily. 14 tablet 0  . pantoprazole (PROTONIX) 40 MG tablet Take 1 tablet (40 mg total) by mouth 2 (two) times daily before a meal. 60 tablet 3  . pravastatin  (PRAVACHOL) 20 MG tablet Take 1 tablet (20 mg total) by mouth daily at 6 PM. For high cholesterol 10 tablet 0  . traZODone (DESYREL) 100 MG tablet Take 1 tablet (100 mg total) by mouth at bedtime as needed for sleep. 30 tablet 0   No current facility-administered medications on file prior to visit.     BP 118/83 (BP Location: Right Arm, Cuff Size: Large)   Pulse 80   Temp 98.2 F (36.8 C) (Oral)   Resp 16   Ht 5\' 5"  (1.651 m)   Wt 220 lb (99.8 kg)   SpO2 98%   BMI 36.61 kg/m    Objective:   Physical Exam  Constitutional: He is oriented to person, place, and time. He appears well-developed and well-nourished. No distress.  Musculoskeletal: He exhibits no edema.  Neurological: He is alert and oriented to person, place, and time.  Skin:  Left great toenail is discolored and misshapen/curved. Surrounding soft tissue is tender to palpation        Assessment:     Onychomycosis/early paronychia- will rx with keflex and refer to podiatry for further evaluation. Will likely need rx with lamisil and possibly toenail removal.   Flu shot today Plan:

## 2018-11-09 ENCOUNTER — Ambulatory Visit: Payer: Medicare HMO | Admitting: Physical Therapy

## 2018-11-09 ENCOUNTER — Encounter: Payer: Self-pay | Admitting: Physical Therapy

## 2018-11-09 DIAGNOSIS — M5416 Radiculopathy, lumbar region: Secondary | ICD-10-CM

## 2018-11-09 NOTE — Therapy (Signed)
[place], [place], [address] Phone: [phone]   Fax:  [phone]  Physical Therapy Treatment  Patient Details  Name: Lucas Knapp MRN: [id number] Date of Birth: [birth year] Referring Provider (PT): Debbrah Alar , NP    Encounter Date: 11/09/2018  PT End of Session - 11/09/18 0806    Visit Number  5    Number of Visits  16    Date for PT Re-Evaluation  12/17/18    PT Start Time  0731   30 minutes with heat   PT Stop Time  0816    PT Time Calculation (min)  45 min    Activity Tolerance  Patient tolerated treatment well;No increased pain    Behavior During Therapy  [facility] for tasks assessed/performed       Past Medical History:  Diagnosis Date  . Back pain   . Bipolar 1 disorder ([place])   . Hyperlipidemia   . Hypertension    pt has been prescribed HCTZ 12.5 mg daily. Pt was 140/80 on admission.   . Schizophrenia ([place])     History reviewed. No pertinent surgical history.  There were no vitals filed for this visit.  Subjective Assessment - 11/09/18 0732    Subjective  Pain 4/10 on Friday.  stretches helped.    Currently in Pain?  Yes    Pain Score  8    friday 4/10   Pain Location  Back    Pain Orientation  Left;Lower    Pain Descriptors / Indicators  Aching   cracks some   Pain Radiating Towards   no gluteals,  goes up back    Pain Frequency  Intermittent    Aggravating Factors   cloudy , stretching,  sitting ,  standing,  pushijg over an hour.     Pain Relieving Factors  sunny days,  stretching,  heat, laying down ,, Aleve. Sleeping on left side withlegs bent.                         [facility] Adult PT Treatment/Exercise - 11/09/18 0001      Self-Care   Self-Care  Posture    Posture  standing low back cues to decrease curve.       Therapeutic Activites    Therapeutic Activities  Lifting;Work Leisure centre manager from different heights   able to do  correctly with cues   Work Optician, dispensing: Diplomatic Services operational officer  2 reps;30 seconds    Active Hamstring Stretch Limitations  with foot pump,  LT tighter than right    Single Knee to Chest Stretch  2 reps;20 seconds    Single Knee to Chest Stretch Limitations  left tighter    Lower Trunk Rotation  2 reps;20 seconds    Quad Stretch  2 reps;20 seconds    Gastroc Stretch  1 rep;60 seconds    Gastroc Stretch Limitations  incline      Lumbar Exercises: Supine   Bridge  10 reps             PT Education - 11/09/18 0805    Education Details  ADL handout,  Work Editor, commissioning) Educated  Patient    Methods  Explanation;Demonstration;Verbal cues;Handout    Comprehension  Returned demonstration;Verbalized understanding          PT Long Term Goals -  11/09/18 0741      PT LONG TERM GOAL #1   Title  Patient will be I with HEP    Baseline  inconsint with compliance    Time  6    Period  Weeks    Status  On-going      PT LONG TERM GOAL #2   Title  Pt will be able to work , including lifting without pain exacerbation.     Baseline  part time work pain 6/10    Time  6    Period  Weeks    Status  On-going      PT LONG TERM GOAL #3   Title  Pt will be able to sit comfortably for meals, driving for up to 30 min     Baseline  unclear about time.  does sit 60 minutes.    Time  6    Period  Weeks    Status  Partially Met      PT LONG TERM GOAL #4   Title  Pt will be able to demo proper lifting techniques to avoid increased pain     Baseline  education today    Time  6    Period  Weeks    Status  On-going      PT LONG TERM GOAL #5   Title  Pt will no longer have difficulty sleeping due to pain.    Baseline  sleeping OK lately    Time  6    Period  Weeks    Status  Partially Met      PT LONG TERM GOAL #6   Title  Pt will have no more LE pain (radicular symptoms) in LLE     Time  6   None since Friday   Period  Weeks     Status  Partially Met            Plan - 11/09/18 0807    Clinical Impression Statement  progress toward goals for sleeping and pain.  see flow sheet.  Education for simulation for work activities.  Patient able to demo correct lifting technique for light loads.  No pain increase with session.    PT Next Visit Plan   flexion based core.  Posture, lifting with simulation (laundry bag)     PT Home Exercise Plan  post pelvic tilt, hip flexor stretch, hamstring stretch and knee to chest All QL stretches,  calf stretch    Consulted and Agree with Plan of Care  Patient       Patient will benefit from skilled therapeutic intervention in order to improve the following deficits and impairments:     Visit Diagnosis: Radiculopathy, lumbar region     Problem List Patient Active Problem List   Diagnosis Date Noted  . Preventative health care 09/29/2016  . HTN (hypertension) 02/18/2016  . Hyperlipidemia 02/18/2016  . Fatigue 02/18/2016  . Low back pain 02/18/2016  . Abdominal pain 11/27/2014  . Schizophrenia, paranoid type ([place]) 09/17/2012    Class: Acute  . Cannabis abuse 09/17/2012    , PTA 11/09/2018, 8:10 AM  [place], [place], [address] Phone: [phone]   Fax:  [phone]  Name: Lucas Knapp MRN: [id number] Date of Birth: 26-Jul-1986

## 2018-11-09 NOTE — Patient Instructions (Signed)

## 2018-11-11 ENCOUNTER — Encounter: Payer: Self-pay | Admitting: Physical Therapy

## 2018-11-11 ENCOUNTER — Ambulatory Visit: Payer: Medicare HMO | Admitting: Physical Therapy

## 2018-11-11 DIAGNOSIS — M5416 Radiculopathy, lumbar region: Secondary | ICD-10-CM

## 2018-11-11 NOTE — Therapy (Signed)
[place], [place], [address] Phone: [phone]   Fax:  [phone]  Physical Therapy Treatment  Patient Details  Name: Lucas Knapp MRN: [id number] Date of Birth: 07-15-1986 Referring Provider (PT): Debbrah Alar , NP    Encounter Date: 11/11/2018  PT End of Session - 11/11/18 0755    Visit Number  6    Number of Visits  16    Date for PT Re-Evaluation  12/17/18    PT Start Time  6962   late with moist heat and 30 minute session.    PT Stop Time  0816    PT Time Calculation (min)  39 min    Activity Tolerance  Patient tolerated treatment well    Behavior During Therapy  WFL for tasks assessed/performed       Past Medical History:  Diagnosis Date  . Back pain   . Bipolar 1 disorder ([place])   . Hyperlipidemia   . Hypertension    pt has been prescribed HCTZ 12.5 mg daily. Pt was 140/80 on admission.   . Schizophrenia ([place])     History reviewed. No pertinent surgical history.  There were no vitals filed for this visit.  Subjective Assessment - 11/11/18 0745    Subjective  I used the techniques with bracing tummy and I had less pain at work.    Currently in Pain?  Yes    Pain Score  4     Pain Location  Back                       [facility] Adult PT Treatment/Exercise - 11/11/18 0001      Self-Care   Self-Care  Posture    Posture  mirror used to show lateral shift looks, mirror used      Lumbar Exercises: Stretches   Pelvic Tilt  10 reps    Other Lumbar Stretch Exercise  Mc Kenzie lateral technique.      Lumbar Exercises: Supine   AB Set Limitations  with 5 breaths in a row    Clam  10 reps    Clam Limitations  challanding,  band tried,  too hard.     Bridge  10 reps      Moist Heat Therapy   Number Minutes Moist Heat  15 Minutes    Moist Heat Location  Lumbar Spine             PT Education - 11/11/18 0755    Education Details  exercise form    Person(s) Educated  Patient    Methods  Explanation;Tactile cues;Verbal cues    Comprehension  Verbalized understanding;Returned demonstration          PT Long Term Goals - 11/09/18 0741      PT LONG TERM GOAL #1   Title  Patient will be I with HEP    Baseline  inconsint with compliance    Time  6    Period  Weeks    Status  On-going      PT LONG TERM GOAL #2   Title  Pt will be able to work , including lifting without pain exacerbation.     Baseline  part time work pain 6/10    Time  6    Period  Weeks    Status  On-going      PT LONG TERM GOAL #3   Title  Pt will be able to sit comfortably for  meals, driving for up to 30 min     Baseline  unclear about time.  does sit 60 minutes.    Time  6    Period  Weeks    Status  Partially Met      PT LONG TERM GOAL #4   Title  Pt will be able to demo proper lifting techniques to avoid increased pain     Baseline  education today    Time  6    Period  Weeks    Status  On-going      PT LONG TERM GOAL #5   Title  Pt will no longer have difficulty sleeping due to pain.    Baseline  sleeping OK lately    Time  6    Period  Weeks    Status  Partially Met      PT LONG TERM GOAL #6   Title  Pt will have no more LE pain (radicular symptoms) in LLE     Time  6   None since Friday   Period  Weeks    Status  Partially Met            Plan - 11/11/18 0757    Clinical Impression Statement  less pain with working while using good mechanics and abdominal bracing.  Sleeping with a pillow helpful.  Abdominals  continue to need increased endurance    PT Next Visit Plan   flexion based core.  Posture, lifting with simulation (laundry bag) .  Check for neutral spine, may need Mckenzie lateral technique    PT Home Exercise Plan  post pelvic tilt, hip flexor stretch, hamstring stretch and knee to chest All QL stretches,  calf stretch    Consulted and Agree with Plan of Care  Patient       Patient will benefit from skilled  therapeutic intervention in order to improve the following deficits and impairments:     Visit Diagnosis: Radiculopathy, lumbar region     Problem List Patient Active Problem List   Diagnosis Date Noted  . Preventative health care 09/29/2016  . HTN (hypertension) 02/18/2016  . Hyperlipidemia 02/18/2016  . Fatigue 02/18/2016  . Low back pain 02/18/2016  . Abdominal pain 11/27/2014  . Schizophrenia, paranoid type ([place]) 09/17/2012    Class: Acute  . Cannabis abuse 09/17/2012    [place] Mink PTA 11/11/2018, 8:02 AM  [facility] [address] [place], [place], [address] Phone: [phone]   Fax:  [phone]  Name: Treyveon Mochizuki MRN: [id number] Date of Birth: [birth year]

## 2018-11-16 ENCOUNTER — Ambulatory Visit: Payer: Medicare HMO | Admitting: Physical Therapy

## 2018-11-16 ENCOUNTER — Encounter: Payer: Self-pay | Admitting: Physical Therapy

## 2018-11-16 DIAGNOSIS — M5416 Radiculopathy, lumbar region: Secondary | ICD-10-CM

## 2018-11-16 NOTE — Patient Instructions (Signed)
Anchor the knot in the doorway. Lie on the ground Breathe in to prepare Exhale as you pull down with straight arms , keeping back flat, hips on the floor X 15

## 2018-11-16 NOTE — Therapy (Signed)
[place], [place], [address] Phone: [phone]   Fax:  [phone]  Physical Therapy Treatment  Patient Details  Name: Lucas Knapp MRN: [id number] Date of Birth: [birth year] Referring Provider (PT): Debbrah Alar , NP    Encounter Date: 11/16/2018  PT End of Session - 11/16/18 1609    Visit Number  7    Number of Visits  16    Date for PT Re-Evaluation  12/17/18    PT Start Time  6269    PT Stop Time  1641    PT Time Calculation (min)  57 min    Activity Tolerance  Patient tolerated treatment well    Behavior During Therapy  [facility] for tasks assessed/performed       Past Medical History:  Diagnosis Date  . Back pain   . Bipolar 1 disorder ([place])   . Hyperlipidemia   . Hypertension    pt has been prescribed HCTZ 12.5 mg daily. Pt was 140/80 on admission.   . Schizophrenia ([place])     History reviewed. No pertinent surgical history.  There were no vitals filed for this visit.  Subjective Assessment - 11/16/18 1547    Subjective  Work is getting better.  The only thing now is pulling out heavy wet sheets.  Off today. Back is 6/10.  Neck stiff I slept funny.      Currently in Pain?  Yes    Pain Score  6     Pain Location  Back    Pain Orientation  Left;Lower;Mid    Pain Descriptors / Indicators  Tightness    Pain Type  Chronic pain    Pain Onset  More than a month ago    Pain Frequency  Intermittent    Aggravating Factors   weather, lifting, not using good techniqu     Pain Relieving Factors  stretching, abdominals              [facility] Adult PT Treatment/Exercise - 11/16/18 0001      Pilates   Other Pilates  Spring board : bilateral shoulder extension yellow spring, added knee lift each side x 10 , given for HEP with blue band       Lumbar Exercises: Stretches   Active Hamstring Stretch  5 reps    Active Hamstring Stretch Limitations  bilateral towel under knee     Single Knee  to Chest Stretch  3 reps;30 seconds    Lower Trunk Rotation  10 seconds   x 8   Pelvic Tilt  10 reps      Lumbar Exercises: Aerobic   Nustep  6 min L5 UE and LE       Lumbar Exercises: Standing   Row  Strengthening;Both;10 reps    Theraband Level (Row)  Level 4 (Blue)    Shoulder Extension  Strengthening;Both;15 reps;Theraband    Theraband Level (Shoulder Extension)  Level 4 (Blue)    Other Standing Lumbar Exercises  Freemotion 2 plates push and pull for core stability (antirotation work)    diagonal chop 2 plates x 10 each direction    Other Standing Lumbar Exercises  shoulder extension blue in doorway x10       Lumbar Exercises: Sidelying   Other Sidelying Lumbar Exercises  QL stretch to L side 60 sec then manual       Moist Heat Therapy   Number Minutes Moist Heat  15 Minutes    Moist Heat Location  Lumbar Spine      Manual Therapy   Manual Therapy  Soft tissue mobilization    Manual therapy comments  LT side    Soft tissue mobilization  L QL compression, parapsinals on L thoracolumbar              PT Education - 11/16/18 1609    Education Details  core, breathing , HEP     Person(s) Educated  Patient    Methods  Explanation    Comprehension  Verbalized understanding          PT Long Term Goals - 11/09/18 0741      PT LONG TERM GOAL #1   Title  Patient will be I with HEP    Baseline  inconsint with compliance    Time  6    Period  Weeks    Status  On-going      PT LONG TERM GOAL #2   Title  Pt will be able to work , including lifting without pain exacerbation.     Baseline  part time work pain 6/10    Time  6    Period  Weeks    Status  On-going      PT LONG TERM GOAL #3   Title  Pt will be able to sit comfortably for meals, driving for up to 30 min     Baseline  unclear about time.  does sit 60 minutes.    Time  6    Period  Weeks    Status  Partially Met      PT LONG TERM GOAL #4   Title  Pt will be able to demo proper lifting techniques to  avoid increased pain     Baseline  education today    Time  6    Period  Weeks    Status  On-going      PT LONG TERM GOAL #5   Title  Pt will no longer have difficulty sleeping due to pain.    Baseline  sleeping OK lately    Time  6    Period  Weeks    Status  Partially Met      PT LONG TERM GOAL #6   Title  Pt will have no more LE pain (radicular symptoms) in LLE     Time  6   None since Friday   Period  Weeks    Status  Partially Met            Plan - 11/16/18 1610    Clinical Impression Statement  Pt improving.  Worked on core strength today to continue positive trend of less pain. Lt Quadratus painful, lessened with softi tissue work . No lateral hsift today.     PT Treatment/Interventions  ADLs/Self Care Home Management;Electrical Stimulation;Functional mobility training;Neuromuscular re-education;Cryotherapy;Ultrasound;Traction;Moist Heat;Therapeutic exercise;Therapeutic activities;Patient/family education;Manual techniques;Passive range of motion    PT Next Visit Plan   QL stretch ,flexion based core.  Posture, lifting with simulation (laundry bag) .  Check for neutral spine, may need Mckenzie lateral technique    PT Home Exercise Plan  post pelvic tilt, hip flexor stretch, hamstring stretch and knee to chest All QL stretches,  calf stretch, supine blue band pull down for core     Consulted and Agree with Plan of Care  Patient       Patient will benefit from skilled therapeutic intervention in order to improve the following deficits and impairments:  Pain, Impaired flexibility, Increased  fascial restricitons, Decreased strength, Decreased range of motion, Decreased mobility, Hypomobility  Visit Diagnosis: Radiculopathy, lumbar region     Problem List Patient Active Problem List   Diagnosis Date Noted  . Preventative health care 09/29/2016  . HTN (hypertension) 02/18/2016  . Hyperlipidemia 02/18/2016  . Fatigue 02/18/2016  . Low back pain 02/18/2016  .  Abdominal pain 11/27/2014  . Schizophrenia, paranoid type ([place]) 09/17/2012    Class: Acute  . Cannabis abuse 09/17/2012    PAA,JENNIFER 11/16/2018, 4:33 PM  [facility] [address] [place], [place], [address] Phone: [phone]   Fax:  [phone]  Name: Lucas Knapp MRN: [id number] Date of Birth: Jan 11, 1986   Raeford Razor, PT 11/16/18 4:34 PM Phone: [phone] Fax: [phone]

## 2018-11-23 ENCOUNTER — Encounter: Payer: Self-pay | Admitting: Physical Therapy

## 2018-11-23 ENCOUNTER — Ambulatory Visit: Payer: Medicare HMO | Attending: Family | Admitting: Physical Therapy

## 2018-11-23 DIAGNOSIS — M5416 Radiculopathy, lumbar region: Secondary | ICD-10-CM | POA: Insufficient documentation

## 2018-11-23 NOTE — Therapy (Signed)
[place], [place], [address] Phone: [phone]   Fax:  [phone]  Physical Therapy Treatment  Patient Details  Name: Lucas Knapp MRN: [id number] Date of Birth: 1986/12/01 Referring Provider (PT): Debbrah Alar , NP    Encounter Date: 11/23/2018  PT End of Session - 11/23/18 1627    Visit Number  8    Number of Visits  16    Date for PT Re-Evaluation  12/17/18    PT Start Time  1525    PT Stop Time  1625    PT Time Calculation (min)  60 min    Activity Tolerance  Patient tolerated treatment well    Behavior During Therapy  [facility] for tasks assessed/performed       Past Medical History:  Diagnosis Date  . Back pain   . Bipolar 1 disorder ([place])   . Hyperlipidemia   . Hypertension    pt has been prescribed HCTZ 12.5 mg daily. Pt was 140/80 on admission.   . Schizophrenia ([place])     History reviewed. No pertinent surgical history.  There were no vitals filed for this visit.  Subjective Assessment - 11/23/18 1521    Subjective   Does the exercises.  Pain intermittant with sucking my stomach.  Able  to avoid twisting with getting sheets out with less pain ( 6/10).  Pain not waking at night..  No leg pain since last week.     Currently in Pain?  Yes    Pain Score  4     Pain Location  Back    Pain Orientation  Left;Lower;Mid    Pain Descriptors / Indicators  Tightness   knot   Pain Type  Chronic pain    Pain Radiating Towards  no gluteals,  sometimes goes up back  every 3 days.    Pain Frequency  Intermittent    Aggravating Factors   not using good technique, weather,  lifting    Pain Relieving Factors  abdominals,  good body mechanics     Effect of Pain on Daily Activities  limits comfort.      Multiple Pain Sites  No                       [facility] Adult PT Treatment/Exercise - 11/23/18 0001      Lumbar Exercises: Stretches   Other Lumbar Stretch Exercise  Mc Kenzie  lateral technique.   5 X 15     Lumbar Exercises: Aerobic   Nustep  7 minutes L5,  LE only.        Lumbar Exercises: Supine   AB Set Limitations  Transversus abdominus exercise 1 X 10   cues.  also 1 A for HEP.Marland Kitchen   Pelvic Tilt  10 reps    Clam Limitations  ball squeeze helped ease pain.      Bent Knee Raise  10 reps    Other Supine Lumbar Exercises  leg press left 10 X no pain increase    Other Supine Lumbar Exercises  ball squeeze 10 X 2 sets decreased pain from 4/10 to 3/10       Moist Heat Therapy   Number Minutes Moist Heat  15 Minutes    Moist Heat Location  Lumbar Spine   QUadratus while on side with arm overhead.     Manual Therapy   Manual Therapy  Soft tissue mobilization    Manual therapy comments  QL tenderness  continues .      Soft tissue mobilization  L QL strumming, parapsinals on L thoracolumbar              PT Education - 11/23/18 1610    Education Details  HEP    Person(s) Educated  Patient    Methods  Explanation;Tactile cues;Verbal cues;Handout;Demonstration    Comprehension  Verbalized understanding;Returned demonstration          PT Long Term Goals - 11/23/18 1620      PT LONG TERM GOAL #1   Title  Patient will be I with HEP    Baseline  does exercises more consistantly    Time  6    Period  Weeks    Status  On-going      PT LONG TERM GOAL #2   Title  Pt will be able to work , including lifting without pain exacerbation.     Baseline   work pain 6/10    Time  6    Period  Weeks    Status  On-going      PT LONG TERM GOAL #3   Title  Pt will be able to sit comfortably for meals, driving for up to 30 min     Time  6    Period  Weeks    Status  Unable to assess      PT LONG TERM GOAL #4   Title  Pt will be able to demo proper lifting techniques to avoid increased pain     Baseline  reports using good technique with lifting and has less pain     Time  6    Period  Weeks    Status  Partially Met      PT LONG TERM GOAL #5   Title   Pt will no longer have difficulty sleeping due to pain.    Baseline  pain not waking    Time  6    Period  Weeks    Status  Achieved      PT LONG TERM GOAL #6   Title  Pt will have no more LE pain (radicular symptoms) in LLE    No pain since last week   Time  6    Period  Weeks    Status  Partially Met            Plan - 11/23/18 1624    Clinical Impression Statement  progress toward sleeping goals.  pain improving however usually a 6/10 at wnd of work.  3/10 pain at end of session.     PT Next Visit Plan   QL stretch ,flexion based core.  Posture, lifting with simulation (laundry bag) .  Check for neutral spine, may need Mckenzie lateral technique    PT Home Exercise Plan  post pelvic tilt, hip flexor stretch, hamstring stretch and knee to chest All QL stretches,  calf stretch, supine blue band pull down for core Transversus abdominus,  hooklying ball squeeze.     Consulted and Agree with Plan of Care  Patient       Patient will benefit from skilled therapeutic intervention in order to improve the following deficits and impairments:     Visit Diagnosis: Radiculopathy, lumbar region     Problem List Patient Active Problem List   Diagnosis Date Noted  . Preventative health care 09/29/2016  . HTN (hypertension) 02/18/2016  . Hyperlipidemia 02/18/2016  . Fatigue 02/18/2016  . Low back pain 02/18/2016  .  Abdominal pain 11/27/2014  . Schizophrenia, paranoid type ([place]) 09/17/2012    Class: Acute  . Cannabis abuse 09/17/2012    Lucas Knapp 11/23/2018, 4:28 PM  [facility] [address] [place], [place], [address] Phone: [phone]   Fax:  [phone]  Name: Lucas Knapp MRN: [id number] Date of Birth: 31-May-1986

## 2018-11-23 NOTE — Patient Instructions (Addendum)
Issued from exercise drawer: Transversus abdominus exercises  Exercise1 and 1 A issued.   Daily 10 X each hold 2-3 seconds,  Also: While on back with knees flexed  pillow squeeze.  Daily 10 x 2 sets Hold 2-3 seconds. Be sure to breath.  this should feel good.

## 2018-11-25 ENCOUNTER — Encounter: Payer: Self-pay | Admitting: Physical Therapy

## 2018-11-25 ENCOUNTER — Ambulatory Visit: Payer: Medicare HMO | Admitting: Physical Therapy

## 2018-11-25 DIAGNOSIS — M5416 Radiculopathy, lumbar region: Secondary | ICD-10-CM | POA: Diagnosis not present

## 2018-11-25 NOTE — Therapy (Signed)
[place], [place], [address] Phone: [phone]   Fax:  [phone]  Physical Therapy Treatment  Patient Details  Name: Lucas Knapp MRN: [id number] Date of Birth: September 04, 1986 Referring Provider (PT): Debbrah Alar , NP    Encounter Date: 11/25/2018  PT End of Session - 11/25/18 1733    Visit Number  9    Number of Visits  16    Date for PT Re-Evaluation  12/17/18    PT Start Time  1634    PT Stop Time  1720    PT Time Calculation (min)  46 min    Activity Tolerance  Patient tolerated treatment well    Behavior During Therapy  [facility] for tasks assessed/performed       Past Medical History:  Diagnosis Date  . Back pain   . Bipolar 1 disorder ([place])   . Hyperlipidemia   . Hypertension    pt has been prescribed HCTZ 12.5 mg daily. Pt was 140/80 on admission.   . Schizophrenia ([place])     History reviewed. No pertinent surgical history.  There were no vitals filed for this visit.  Subjective Assessment - 11/25/18 1640    Subjective  Pain a 5/10 today in back.  Working on the exercises.     Currently in Pain?  Yes    Pain Score  5     Pain Location  Back    Pain Orientation  Left;Lower    Pain Descriptors / Indicators  Tightness    Pain Type  Chronic pain    Aggravating Factors   cold weather,  lifting    Effect of Pain on Daily Activities  Limits comfort.      Multiple Pain Sites  No                       [facility] Adult PT Treatment/Exercise - 11/25/18 0001      Lumbar Exercises: Stretches   Passive Hamstring Stretch  3 reps;30 seconds   strap,   Passive Hamstring Stretch Limitations  ROM improving  82 left,  85 RT    much improved   Hip Flexor Stretch  3 reps;30 seconds    Hip Flexor Stretch Limitations  with quad stretch off edge of bed and with quad stretch   both,     Quadruped Mid Back Stretch  3 reps;10 seconds    Quadruped Mid Back Stretch Limitations  cat/  camel/  cued initially    Other Lumbar Stretch Exercise  Mc Kenzie lateral technique.   5 X 15   Other Lumbar Stretch Exercise  childs pose and QL stretch       Lumbar Exercises: Aerobic   Nustep  7 minutes L5,  LE only.        Lumbar Exercises: Standing   Other Standing Lumbar Exercises  trial 1 ply heel lift left shoe  patient to try over next couple of days.       Lumbar Exercises: Supine   AB Set Limitations  transversus abdominus  X 10     Clam  10 reps;Other (comment)   with transversus abdominus and pelvic floor.    Clam Limitations  ball squeeze    Bent Knee Raise  10 reps             PT Education - 11/25/18 1733    Education Details  exercise form    Person(s) Educated  Patient  Methods  Explanation;Demonstration;Tactile cues;Verbal cues    Comprehension  Verbalized understanding;Returned demonstration          PT Long Term Goals - 11/25/18 1739      PT LONG TERM GOAL #1   Title  Patient will be I with HEP    Time  6    Period  Weeks    Status  On-going      PT LONG TERM GOAL #2   Title  Pt will be able to work , including lifting without pain exacerbation.     Baseline   work pain 6/10    Time  6    Period  Weeks    Status  On-going      PT LONG TERM GOAL #3   Title  Pt will be able to sit comfortably for meals, driving for up to 30 min     Baseline  able to do     Time  6    Period  Weeks    Status  Achieved      PT LONG TERM GOAL #4   Title  Pt will be able to demo proper lifting techniques to avoid increased pain     Time  6    Period  Weeks    Status  Unable to assess      PT LONG TERM GOAL #5   Title  Pt will no longer have difficulty sleeping due to pain.    Baseline  pain not waking    Time  6    Period  Weeks    Status  Achieved      PT LONG TERM GOAL #6   Title  Pt will have no more LE pain (radicular symptoms) in LLE    none today   Time  6    Period  Weeks    Status  Partially Met            Plan - 11/25/18  1735    Clinical Impression Statement  Patient has had 9 visits of PT.  Pain is decreasing on the days he does not work however this is not consistant. LTG#3 met.  Hamstring length is improving passivly to 82-85 degrees. Trial of heel lift, 1 ply in  left shoe to assist pain in standing  decrease lateral shift.     PT Next Visit Plan  assess heel lift.  FOTO if needed. check SI.  stabilize,  Simulate work tasks.     PT Home Exercise Plan  post pelvic tilt, hip flexor stretch, hamstring stretch and knee to chest All QL stretches,  calf stretch, supine blue band pull down for core Transversus abdominus,  hooklying ball squeeze.     Consulted and Agree with Plan of Care  Patient       Patient will benefit from skilled therapeutic intervention in order to improve the following deficits and impairments:     Visit Diagnosis: Radiculopathy, lumbar region     Problem List Patient Active Problem List   Diagnosis Date Noted  . Preventative health care 09/29/2016  . HTN (hypertension) 02/18/2016  . Hyperlipidemia 02/18/2016  . Fatigue 02/18/2016  . Low back pain 02/18/2016  . Abdominal pain 11/27/2014  . Schizophrenia, paranoid type ([place]) 09/17/2012    Class: Acute  . Cannabis abuse 09/17/2012    HARRIS,KAREN PTA 11/25/2018, 5:40 PM  [facility] [address] [place], [place], [address] Phone: [phone]   Fax:  [phone]  Name: Lucas Knapp MRN: [id number] Date of Birth: April 09, 1986

## 2018-12-07 ENCOUNTER — Telehealth: Payer: Self-pay | Admitting: Physical Therapy

## 2018-12-07 ENCOUNTER — Ambulatory Visit: Payer: Medicare HMO | Admitting: Physical Therapy

## 2018-12-07 NOTE — Telephone Encounter (Signed)
Left message on voicemail about missed visit.  Date and time of next appointment given. Phone number given for patient to call if he is unable to attend or if he is no longer interested in PT.  Policy for attendance reviewed.  Liz BeachKaren Dionis Autry PTA

## 2018-12-09 ENCOUNTER — Ambulatory Visit: Payer: Medicare HMO | Admitting: Physical Therapy

## 2018-12-09 ENCOUNTER — Encounter: Payer: Self-pay | Admitting: Physical Therapy

## 2018-12-09 DIAGNOSIS — M5416 Radiculopathy, lumbar region: Secondary | ICD-10-CM

## 2018-12-09 NOTE — Therapy (Signed)
[facility] [address] [place], [place], [address] Phone: [phone]   Fax:  [phone]  Physical Therapy Treatment  Patient Details  Name: Lucas Knapp MRN: [id number] Date of Birth: 05/29/1986 Referring Provider (PT): Sandford CrazeMelissa O'Sullivan , NP    Encounter Date: 12/09/2018  PT End of Session - 12/09/18 1500    Visit Number  10    Number of Visits  16    Date for PT Re-Evaluation  12/17/18    PT Start Time  1458    PT Stop Time  1543    PT Time Calculation (min)  45 min    Activity Tolerance  Patient tolerated treatment well    Behavior During Therapy  [facility] for tasks assessed/performed       Past Medical History:  Diagnosis Date  . Back pain   . Bipolar 1 disorder (HCC)   . Hyperlipidemia   . Hypertension    pt has been prescribed HCTZ 12.5 mg daily. Pt was 140/80 on admission.   . Schizophrenia (HCC)     History reviewed. No pertinent surgical history.  There were no vitals filed for this visit.  Subjective Assessment - 12/09/18 1501    Subjective  It has been OK.  I got my days messed up.  Today is about 4/10 in the lower back. I dont have to sort and pull/tug as much now at work.  I want to get back to the gym.     Currently in Pain?  Yes    Pain Score  4     Pain Location  Back    Pain Orientation  Left;Lower    Pain Descriptors / Indicators  Tightness    Pain Type  Chronic pain    Pain Onset  More than a month ago    Pain Frequency  Intermittent    Aggravating Factors   cold, too much lifting     Pain Relieving Factors  PT, HEP          [facility] Adult PT Treatment/Exercise - 12/09/18 0001      Lumbar Exercises: Stretches   Active Hamstring Stretch  Right;Left;3 reps    Single Knee to Chest Stretch  Right;Left;3 reps    Single Knee to Chest Stretch Limitations  opp knee ext     Lower Trunk Rotation  10 seconds    Lower Trunk Rotation Limitations  x 10       Lumbar Exercises: Machines for  Strengthening   Cybex Knee Extension  25 lbs x 15     Cybex Knee Flexion  45 lbs x 15     Leg Press  cues to maintain neutral pelvis and spine and avoid knee hyperextension double leg 2 sets x 15 2 plates then 3 plates     Other Lumbar Machine Exercise  seated row 35 lbs 2 sets x 15 core focus     Other Lumbar Machine Exercise  calf raises were difficult due to knee hyperextend       Lumbar Exercises: Standing   Heel Raises  20 reps    Functional Squats  10 reps    Other Standing Lumbar Exercises  seated lat pull down x 2 x 10 , 35 lbs 1 set standing (high row)              PT Education - 12/09/18 1532    Education Details  gym exercises, core for leg press, form and alignment     Person(s)  Educated  Patient    Methods  Explanation    Comprehension  Verbalized understanding          PT Long Term Goals - 12/09/18 1534      PT LONG TERM GOAL #1   Title  Patient will be I with HEP    Status  On-going      PT LONG TERM GOAL #2   Title  Pt will be able to work , including lifting without pain exacerbation.     Status  On-going      PT LONG TERM GOAL #3   Title  Pt will be able to sit comfortably for meals, driving for up to 30 min     Status  Achieved      PT LONG TERM GOAL #4   Title  Pt will be able to demo proper lifting techniques to avoid increased pain     Status  Achieved      PT LONG TERM GOAL #5   Title  Pt will no longer have difficulty sleeping due to pain.    Status  Achieved      PT LONG TERM GOAL #6   Title  Pt will have no more LE pain (radicular symptoms) in LLE     Baseline  had this about 3 weeks ago     Status  Achieved            Plan - 12/09/18 1536    Clinical Impression Statement  Patient has been doing well, manages his pain with MHP and HEP.  His job is less demanding now so that will help him.  He was introduced to basic strengthening exercises at the gym.  He has not been back to the gym in a year but he really would like to.      PT Treatment/Interventions  ADLs/Self Care Home Management;Electrical Stimulation;Functional mobility training;Neuromuscular re-education;Cryotherapy;Ultrasound;Traction;Moist Heat;Therapeutic exercise;Therapeutic activities;Patient/family education;Manual techniques;Passive range of motion    PT Next Visit Plan  Simulate work, try squats , hip hinge , will be ready for DC end of POC 12/31 (renew for last visit)     PT Home Exercise Plan  post pelvic tilt, hip flexor stretch, hamstring stretch and knee to chest All QL stretches,  calf stretch, supine blue band pull down for core Transversus abdominus,  hooklying ball squeeze.     Consulted and Agree with Plan of Care  Patient       Patient will benefit from skilled therapeutic intervention in order to improve the following deficits and impairments:  Pain, Impaired flexibility, Increased fascial restricitons, Decreased strength, Decreased range of motion, Decreased mobility, Hypomobility  Visit Diagnosis: Radiculopathy, lumbar region     Problem List Patient Active Problem List   Diagnosis Date Noted  . Preventative health care 09/29/2016  . HTN (hypertension) 02/18/2016  . Hyperlipidemia 02/18/2016  . Fatigue 02/18/2016  . Low back pain 02/18/2016  . Abdominal pain 11/27/2014  . Schizophrenia, paranoid type (HCC) 09/17/2012    Class: Acute  . Cannabis abuse 09/17/2012    Nike Southwell 12/09/2018, 3:50 PM  [facility] [address] [place], [place], [address] Phone: [phone]   Fax:  [phone]  Name: Lucas Siaerrance Duff MRN: [id number] Date of Birth: 02/20/1986  Karie MainlandJennifer Eliabeth Shoff, PT 12/09/18 3:50 PM Phone: [phone] Fax: [phone]

## 2018-12-14 ENCOUNTER — Encounter: Payer: Self-pay | Admitting: Physical Therapy

## 2018-12-14 ENCOUNTER — Ambulatory Visit: Payer: Medicare HMO | Admitting: Physical Therapy

## 2018-12-14 DIAGNOSIS — M5416 Radiculopathy, lumbar region: Secondary | ICD-10-CM | POA: Diagnosis not present

## 2018-12-14 NOTE — Therapy (Signed)
[facility] [address] [place], [place], [address] Phone: [phone]   Fax:  [phone]  Physical Therapy Treatment  Patient Details  Name: Lucas Knapp MRN: [id number] Date of Birth: 06/08/1986 Referring Provider (PT): Sandford CrazeMelissa O'Sullivan , NP    Encounter Date: 12/14/2018  PT End of Session - 12/14/18 1256    Visit Number  11    Number of Visits  16    Date for PT Re-Evaluation  12/17/18    PT Start Time  0803    PT Stop Time  0845    PT Time Calculation (min)  42 min    Activity Tolerance  Patient tolerated treatment well    Behavior During Therapy  [facility] for tasks assessed/performed       Past Medical History:  Diagnosis Date  . Back pain   . Bipolar 1 disorder (HCC)   . Hyperlipidemia   . Hypertension    pt has been prescribed HCTZ 12.5 mg daily. Pt was 140/80 on admission.   . Schizophrenia (HCC)     History reviewed. No pertinent surgical history.  There were no vitals filed for this visit.  Subjective Assessment - 12/14/18 0804    Subjective  Back is 3/10.  Woke up with 6-7/10 yesterday and wall stretch helped the pain decrease.   New job duties less stress.      Currently in Pain?  Yes    Pain Score  3     Pain Location  Back    Pain Orientation  Left;Lower    Pain Descriptors / Indicators  --   pain   Pain Type  Chronic pain    Pain Radiating Towards  sometimes into ribs rare    Pain Frequency  Constant    Aggravating Factors   wakes that way.     Pain Relieving Factors  Stretching                       [facility] Adult PT Treatment/Exercise - 12/14/18 0001      Lumbar Exercises: Aerobic   Recumbent Bike  5 minutes L2 changed to L1  challanging.  He is a smoker      Lumbar Exercises: Machines for Strengthening   Cybex Knee Extension  25 LBS X 20    qued technique initially    Cybex Knee Flexion  45 LBS 20 X   cued technique initially.    Leg Press  1 plate  10 X 3 cued  initially for technique.   No pain increase   Other Lumbar Machine Exercise  seated row 35 lbs 2 sets x 15 core focus       Lumbar Exercises: Standing   Heel Raises  20 reps    Heel Raises Limitations  on step slow eccentric encouraged    Other Standing Lumbar Exercises  standing lat pull down,  bicep curls,  chest press and pec deck all with instruction for technique initially.              PT Education - 12/14/18 1256    Education Details  gym equipment use and form for core focus.    Person(s) Educated  Patient    Methods  Explanation;Demonstration;Verbal cues    Comprehension  Verbalized understanding;Returned demonstration          PT Long Term Goals - 12/14/18 1258      PT LONG TERM GOAL #1   Title  Patient will be  I with HEP    Baseline  says he is doing    Time  6    Period  Weeks    Status  On-going      PT LONG TERM GOAL #2   Title  Pt will be able to work , including lifting without pain exacerbation.     Baseline  able to lift in clinic with 3/10 pain    Time  6    Period  Weeks    Status  On-going      PT LONG TERM GOAL #3   Title  Pt will be able to sit comfortably for meals, driving for up to 30 min     Time  6    Period  Weeks    Status  Achieved      PT LONG TERM GOAL #4   Title  Pt will be able to demo proper lifting techniques to avoid increased pain     Time  6    Period  Weeks    Status  Achieved      PT LONG TERM GOAL #5   Title  Pt will no longer have difficulty sleeping due to pain.    Baseline  pain not waking    Time  6    Period  Weeks    Status  Achieved      PT LONG TERM GOAL #6   Title  Pt will have no more LE pain (radicular symptoms) in LLE     Time  6    Period  Weeks    Status  Achieved            Plan - 12/14/18 1256    Clinical Impression Statement   Patient is motivated to return to the gym.  He follows instructions well concering use of equipment for safety.  He is a smoker so endurance on recumbant  limited.  No pain increased with the use of light weights.     PT Next Visit Plan  Simulate work, try squats , hip hinge , will be ready for DC end of POC 12/31 (renew for last visit)     PT Home Exercise Plan  post pelvic tilt, hip flexor stretch, hamstring stretch and knee to chest All QL stretches,  calf stretch, supine blue band pull down for core Transversus abdominus,  hooklying ball squeeze.     Consulted and Agree with Plan of Care  Patient       Patient will benefit from skilled therapeutic intervention in order to improve the following deficits and impairments:     Visit Diagnosis: Radiculopathy, lumbar region     Problem List Patient Active Problem List   Diagnosis Date Noted  . Preventative health care 09/29/2016  . HTN (hypertension) 02/18/2016  . Hyperlipidemia 02/18/2016  . Fatigue 02/18/2016  . Low back pain 02/18/2016  . Abdominal pain 11/27/2014  . Schizophrenia, paranoid type (HCC) 09/17/2012    Class: Acute  . Cannabis abuse 09/17/2012    Emmah Bratcher PTA 12/14/2018, 1:01 PM  [facility] [address] [place], [place], [address] Phone: [phone]   Fax:  [phone]  Name: Lucas Knapp MRN: [id number] Date of Birth: 03/06/1986

## 2018-12-17 ENCOUNTER — Encounter

## 2018-12-20 ENCOUNTER — Emergency Department (HOSPITAL_COMMUNITY)
Admission: EM | Admit: 2018-12-20 | Discharge: 2018-12-21 | Disposition: A | Discharge status: Other Acute Inpatient Hospital | Payer: Medicare HMO | Attending: Emergency Medicine | Admitting: Emergency Medicine

## 2018-12-20 ENCOUNTER — Encounter (HOSPITAL_COMMUNITY): Payer: Self-pay | Admitting: *Deleted

## 2018-12-20 DIAGNOSIS — I10 Essential (primary) hypertension: Secondary | ICD-10-CM | POA: Insufficient documentation

## 2018-12-20 DIAGNOSIS — R443 Hallucinations, unspecified: Secondary | ICD-10-CM | POA: Diagnosis present

## 2018-12-20 DIAGNOSIS — F209 Schizophrenia, unspecified: Secondary | ICD-10-CM | POA: Diagnosis not present

## 2018-12-20 DIAGNOSIS — R451 Restlessness and agitation: Secondary | ICD-10-CM | POA: Diagnosis not present

## 2018-12-20 DIAGNOSIS — F23 Brief psychotic disorder: Secondary | ICD-10-CM | POA: Insufficient documentation

## 2018-12-20 DIAGNOSIS — Z79899 Other long term (current) drug therapy: Secondary | ICD-10-CM | POA: Insufficient documentation

## 2018-12-20 DIAGNOSIS — F250 Schizoaffective disorder, bipolar type: Secondary | ICD-10-CM | POA: Diagnosis not present

## 2018-12-20 DIAGNOSIS — F200 Paranoid schizophrenia: Secondary | ICD-10-CM | POA: Diagnosis present

## 2018-12-20 DIAGNOSIS — F29 Unspecified psychosis not due to a substance or known physiological condition: Secondary | ICD-10-CM | POA: Diagnosis not present

## 2018-12-20 LAB — COMPREHENSIVE METABOLIC PANEL
ALBUMIN: 5.3 g/dL — AB (ref 3.5–5.0)
ALK PHOS: 66 U/L (ref 38–126)
ALT: 107 U/L — AB (ref 0–44)
AST: 193 U/L — ABNORMAL HIGH (ref 15–41)
Anion gap: 15 (ref 5–15)
BUN: 31 mg/dL — ABNORMAL HIGH (ref 6–20)
CALCIUM: 9.8 mg/dL (ref 8.9–10.3)
CHLORIDE: 98 mmol/L (ref 98–111)
CO2: 24 mmol/L (ref 22–32)
CREATININE: 1.67 mg/dL — AB (ref 0.61–1.24)
GFR calc Af Amer: >60 mL/min (ref >60)
GFR calc non Af Amer: 53 mL/min — ABNORMAL LOW (ref >60)
GLUCOSE: 94 mg/dL (ref 70–99)
Potassium: 4.1 mmol/L (ref 3.5–5.1)
SODIUM: 137 mmol/L (ref 135–145)
Total Bilirubin: 2.2 mg/dL — ABNORMAL HIGH (ref 0.3–1.2)
Total Protein: 8.4 g/dL — ABNORMAL HIGH (ref 6.5–8.1)

## 2018-12-20 LAB — CBC
HEMATOCRIT: 48.2 % (ref 39.0–52.0)
HEMOGLOBIN: 16.6 g/dL (ref 13.0–17.0)
MCH: 30.2 pg (ref 26.0–34.0)
MCHC: 34.4 g/dL (ref 30.0–36.0)
MCV: 87.6 fL (ref 80.0–100.0)
Platelets: 369 10*3/uL (ref 150–400)
RBC: 5.5 MIL/uL (ref 4.22–5.81)
RDW: 12.3 % (ref 11.5–15.5)
WBC: 12.6 10*3/uL — ABNORMAL HIGH (ref 4.0–10.5)
nRBC: 0 % (ref 0.0–0.2)

## 2018-12-20 LAB — ETHANOL: Alcohol, Ethyl (B): <10 mg/dL (ref <10)

## 2018-12-20 MED ORDER — DIPHENHYDRAMINE HCL 50 MG/ML IJ SOLN
50.0000 mg | Freq: Once | INTRAMUSCULAR | Status: AC
  Administered 2018-12-21: 50 mg via INTRAMUSCULAR
  Filled 2018-12-20: qty 1

## 2018-12-20 MED ORDER — HALOPERIDOL LACTATE 5 MG/ML IJ SOLN
10.0000 mg | Freq: Once | INTRAMUSCULAR | Status: DC
  Filled 2018-12-20: qty 2

## 2018-12-20 MED ORDER — STERILE WATER FOR INJECTION IJ SOLN
INTRAMUSCULAR | Status: AC
  Filled 2018-12-20: qty 10

## 2018-12-20 MED ORDER — LORAZEPAM 2 MG/ML IJ SOLN
2.0000 mg | Freq: Once | INTRAMUSCULAR | Status: DC
  Filled 2018-12-20: qty 1

## 2018-12-20 MED ORDER — LORAZEPAM 2 MG/ML IJ SOLN
2.0000 mg | Freq: Once | INTRAMUSCULAR | Status: AC
  Administered 2018-12-20: 2 mg via INTRAMUSCULAR

## 2018-12-20 MED ORDER — HALOPERIDOL LACTATE 5 MG/ML IJ SOLN
5.0000 mg | Freq: Four times a day (QID) | INTRAMUSCULAR | Status: DC | PRN
  Administered 2018-12-20 – 2018-12-21 (×2): 5 mg via INTRAMUSCULAR
  Filled 2018-12-20: qty 1

## 2018-12-20 MED ORDER — BENZTROPINE MESYLATE 0.5 MG PO TABS: 0.5000 mg | ORAL_TABLET | Freq: Every day | ORAL | Status: DC

## 2018-12-20 MED ORDER — HALOPERIDOL 5 MG PO TABS
10.0000 mg | ORAL_TABLET | Freq: Two times a day (BID) | ORAL | Status: DC
  Administered 2018-12-21: 10 mg via ORAL
  Filled 2018-12-20: qty 2

## 2018-12-20 MED ORDER — BENZTROPINE MESYLATE 0.5 MG PO TABS
0.5000 mg | ORAL_TABLET | Freq: Two times a day (BID) | ORAL | Status: DC
  Administered 2018-12-21: 0.5 mg via ORAL
  Filled 2018-12-20: qty 1

## 2018-12-20 MED ORDER — ZIPRASIDONE MESYLATE 20 MG IM SOLR
INTRAMUSCULAR | Status: AC
  Filled 2018-12-20: qty 20

## 2018-12-20 MED ORDER — HALOPERIDOL 5 MG PO TABS: 5.0000 mg | ORAL_TABLET | Freq: Two times a day (BID) | ORAL | Status: DC

## 2018-12-20 NOTE — BH Assessment (Addendum)
[facility] Assessment Progress Note  Due to patient's agitation Lord DNP contacted EDP to initiate a IVC. Status pending IVC was initiated. Lord DNP recommends a inpatient admission to assist with stabilization.

## 2018-12-20 NOTE — ED Notes (Signed)
Pt sleeping unable to assess at this time. Pt sleeping in bed, pt safe.

## 2018-12-20 NOTE — BH Assessment (Signed)
Assessment Note  Lucas Knapp is an 32 y.o. male that [facility]. [facility] due to patient's current altered mental state. Per notes patient was observed to be acting bizarre and paranoid. [facility] is speaking in a loud pressured voice being tangential and continues to ruminate on his "blood being poisoned." Patient attempts to show this writer his legs pointing to areas where he has "been injected with poison." This writer attempts to redirect patient unsuccessfully. Patient will not stay in his room and continues to be fixated on delusional somatic issues. It is unclear where the patient is receiving services from although patient states he is currently off his medications. Patient will not render any further information and is unable to be redirected. Per history review, patient was last seen on 07/27/18 presenting with similar symptoms at that time to include flight of ideas and hallucinations. Per review of that note, history was also limited on the patient at that time also. Patient denies any SA history per previous assessment with UDS pending this date although tested positive for THC on 07/27/18. Patient's mood is bizzare, apprehensive, suspicious and tangential. Displays active flight of ideas and will not answer any questions associated with assessment due to current mental state. Patient's behaviors are observed to be escalating with patient getting increasingly agitated the more this writer attempts to gather information. Patient appears to be responding to internal stimuli as evidenced by patient putting his fingers in his ears saying "stop it" and points to the wall asking if this writer "sees the bad guys." Information to complete assessment was obtained from admission notes and history. Patient per notes has a history of schizophrenia, states that he has been off  his medications for 4 days, [facility]. Denies active suicidal or homicidal ideations. States that he has been having hallucinations, hearing bomb blasts. Also states that he has poison in his leg. Due to patient's agitation Lord DNP contacted EDP to initiate a IVC. Status pending IVC in progress. Lord DNP recommends a inpatient admission to assist with stabilization.     Diagnosis: F20.9 Schizophrenia (per notes)   Past Medical History:  Past Medical History:  Diagnosis Date  . Back pain   . Bipolar 1 disorder (HCC)   . Hyperlipidemia   . Hypertension    pt has been prescribed HCTZ 12.5 mg daily. Pt was 140/80 on admission.   . Schizophrenia (HCC)     History reviewed. No pertinent surgical history.  Family History:  Family History  Problem Relation Age of Onset  . Hypertension Father   . Hyperlipidemia Father   . Hypertension Paternal Grandmother   . Hypertension Paternal Grandfather   . Hypertension Brother        identical twin  . Psychosis Brother        "mental issues"      Social History:  reports that he has been smoking cigarettes. He has never used smokeless tobacco. He reports current alcohol use. He reports current drug use. Drug: Marijuana.  Additional Social History:  Alcohol / Drug Use Pain Medications: Please see MAR Prescriptions: Please see MAR Over the Counter: Please see MAR History of alcohol / drug use?: No history of alcohol / drug abuse Longest period of sobriety (when/how long): NA Negative Consequences of Use: (NA) Withdrawal Symptoms: (NA)  CIWA: CIWA-Ar BP: 105/70 Pulse  Rate: 72 COWS:    Allergies:  Allergies  Allergen Reactions  . Geodon [Ziprasidone Hcl] Other (See Comments)    Reaction:  Made pts throat dry     Home Medications: (Not in a hospital admission)   OB/GYN Status:  No LMP for male patient.  General Assessment Data [facility] of Assessment: [facility] TTS Assessment: In system Is  this a Tele or Face-to-Face Assessment?: Face-to-Face Is this an Initial Assessment or a Re-assessment for this encounter?: Initial Assessment Patient Accompanied by:: N/A Language Other than English: No Living Arrangements: Other (Comment)(With brother) What gender do you identify as?: Male Marital status: Single Maiden name: NA Living Arrangements: Other relatives Can pt return to current living arrangement?: Yes Admission Status: Voluntary Is patient capable of signing voluntary admission?: Yes Referral Source: Other([name]) Insurance type: [employer]     Crisis Care Plan Living Arrangements: Other relatives Legal Guardian: (None) Name of Psychiatrist: None Name of Therapist: None  Education Status Is patient currently in school?: No Is the patient employed, unemployed or receiving disability?: Unemployed  Risk to self with the past 6 months Suicidal Ideation: No Has patient been a risk to self within the past 6 months prior to admission? : No Suicidal Intent: No Has patient had any suicidal intent within the past 6 months prior to admission? : No Is patient at risk for suicide?: No, but patient needs Medical Clearance Suicidal Plan?: No Has patient had any suicidal plan within the past 6 months prior to admission? : No Access to Means: No What has been your use of drugs/alcohol within the last 12 months?: Denies Previous Attempts/Gestures: No How many times?: 0 Other Self Harm Risks: NA Triggers for Past Attempts: (NA) Intentional Self Injurious Behavior: None Family Suicide History: No Recent stressful life event(s): (Unknown) Persecutory voices/beliefs?: Yes Depression: (UTA) Depression Symptoms: (UTA) Substance abuse history and/or treatment for substance abuse?: No Suicide prevention information given to non-admitted patients: Not applicable  Risk to Others within the past 6 months Homicidal Ideation: No Does patient have any lifetime risk of violence  toward others beyond the six months prior to admission? : No Thoughts of Harm to Others: No Current Homicidal Intent: No Current Homicidal Plan: No Access to Homicidal Means: No Identified Victim: NA History of harm to others?: No Assessment of Violence: None Noted Violent Behavior Description: NA Does patient have access to weapons?: No Criminal Charges Pending?: No Does patient have a court date: No Is patient on probation?: No  Psychosis Hallucinations: Auditory, Visual Delusions: Persecutory  Mental Status Report Appearance/Hygiene: In scrubs Eye Contact: Poor Motor Activity: Hyperactivity Speech: Pressured, Loud Level of Consciousness: Irritable Mood: Anxious Affect: Fearful Anxiety Level: Severe Thought Processes: Tangential Judgement: Impaired Orientation: Unable to assess Obsessive Compulsive Thoughts/Behaviors: Unable to Assess  Cognitive Functioning Concentration: Unable to Assess Memory: Unable to Assess Is patient IDD: No Insight: Unable to Assess Impulse Control: Unable to Assess Appetite: (UTA) Have you had any weight changes? : (UTA) Sleep: (UTA) Total Hours of Sleep: (UTA) Vegetative Symptoms: (UTA)  ADLScreening [facility] Assessment Services) Patient's cognitive ability adequate to safely complete daily activities?: Yes Patient able to express need for assistance with ADLs?: Yes Independently performs ADLs?: Yes (appropriate for developmental age)  Prior Inpatient Therapy Prior Inpatient Therapy: Yes Prior Therapy Dates: 2019 Prior Therapy Facilty/Provider(s): [facility] Reason for Treatment: MH issues  Prior Outpatient Therapy Prior Outpatient Therapy: (UTA) Prior Therapy Dates: (UTA) Prior Therapy Facilty/Provider(s): (UTA) Reason for Treatment: (UTA) Does patient have an ACCT team?: (  UTA) Does patient have Intensive In-House Services?  : No Does patient have [name] services? : (UTA) Does patient have P4CC services?: No  ADL Screening  (condition at time of admission) Patient's cognitive ability adequate to safely complete daily activities?: Yes Is the patient deaf or have difficulty hearing?: No Does the patient have difficulty seeing, even when wearing glasses/contacts?: No Does the patient have difficulty concentrating, remembering, or making decisions?: No Patient able to express need for assistance with ADLs?: Yes Does the patient have difficulty dressing or bathing?: No Independently performs ADLs?: Yes (appropriate for developmental age) Does the patient have difficulty walking or climbing stairs?: No Weakness of Legs: None Weakness of Arms/Hands: None  Home Assistive Devices/Equipment Home Assistive Devices/Equipment: None  Therapy Consults (therapy consults require a physician order) PT Evaluation Needed: No OT Evalulation Needed: No SLP Evaluation Needed: No Abuse/Neglect Assessment (Assessment to be complete while patient is alone) Physical Abuse: Denies Verbal Abuse: Denies Sexual Abuse: Denies Exploitation of patient/patient's resources: Denies Self-Neglect: Denies Values / Beliefs Cultural Requests During Hospitalization: None Spiritual Requests During Hospitalization: None Consults Spiritual Care Consult Needed: No Social Work Consult Needed: No Merchant navy officerAdvance Directives (For Healthcare) Does Patient Have a Medical Advance Directive?: No Would patient like information on creating a medical advance directive?: No - Patient declined          Disposition: Due to patient's agitation Lord DNP contacted EDP to initiate a IVC. Status pending IVC in progress. Lord DNP recommends a inpatient admission to assist with stabilization.      Disposition Initial Assessment Completed for this Encounter: Yes Disposition of Patient: (Observe and monitor) Patient refused recommended treatment: (UTA due to current mental state ) Mode of transportation if patient is discharged/movement?: (Unk)  On Site Evaluation  by:   Reviewed with Physician:    Alfredia Fergusonavid L Khyleigh Furney 12/20/2018 4:22 PM

## 2018-12-20 NOTE — ED Notes (Signed)
Bed: [facility]39 Expected date:  Expected time:  Means of arrival:  Comments: Triage 3 

## 2018-12-20 NOTE — ED Notes (Signed)
Bed: [facility] Expected date:  Expected time:  Means of arrival:  Comments: 

## 2018-12-20 NOTE — ED Triage Notes (Signed)
Pt brought over by police, who report that the patient needed someone to talk to. [facility]. Pt states his brother is yelling at him for not taking medication. Pt states he wants to "talk to a psychiatrist and psychologist to get them to stop pumping poison in my veins". Pt denies SI, HI, or AV hallucinations.

## 2018-12-20 NOTE — ED Notes (Signed)
Attempted to orient patient to room and unit.  Pt is very disorganized and uninterested.  He appears to be responding to internal stimuli.  Pt is irritable and demanding.  15 minute checks and video monitoring in place.

## 2018-12-20 NOTE — ED Notes (Signed)
Pt arrived to unit very delusional.  He states that someone is poisoning his blood.  He is very agitated.  15 minute checks and video monitoring in place.

## 2018-12-20 NOTE — ED Provider Notes (Signed)
[facility]-EMERGENCY DEPT Provider Note   CSN: [id number] Arrival date & time: 12/20/18  1438     History   Chief Complaint Chief Complaint  Patient presents with  . Psychiatric Evaluation    HPI Lucas Knapp is a 32 y.o. male.  Patient with history of bipolar disorder and schizophrenia, states that he has been off his medications for 4 days, presents from [facility] wanting to talk with somebody.  Denies active suicidal or homicidal ideations.  States that he has been having hallucinations, hearing bomb blasts.  Also states that he has poison in his leg.  Level 5 caveat due to psychiatric illness.     Past Medical History:  Diagnosis Date  . Back pain   . Bipolar 1 disorder (HCC)   . Hyperlipidemia   . Hypertension    pt has been prescribed HCTZ 12.5 mg daily. Pt was 140/80 on admission.   . Schizophrenia [facility](HCC)     Patient Active Problem List   Diagnosis Date Noted  . Preventative health care 09/29/2016  . HTN (hypertension) 02/18/2016  . Hyperlipidemia 02/18/2016  . Fatigue 02/18/2016  . Low back pain 02/18/2016  . Abdominal pain 11/27/2014  . Schizophrenia, paranoid type (HCC) 09/17/2012    Class: Acute  . Cannabis abuse 09/17/2012    History reviewed. No pertinent surgical history.      Home Medications    Prior to Admission medications   Medication Sig Start Date End Date Taking? Authorizing Provider  amLODipine (NORVASC) 5 MG tablet Take 1 tablet (5 mg total) by mouth daily. 08/20/18   Sandford Craze'Sullivan, Melissa, NP  benzonatate (TESSALON) 100 MG capsule Take 1 capsule (100 mg total) by mouth 3 (three) times daily as needed. 09/24/18   Sandford Craze'Sullivan, Melissa, NP  benztropine (COGENTIN) 0.5 MG tablet Take 1 tablet (0.5 mg total) by mouth daily. For prevention of drug induced tremors 08/07/18   Armandina StammerNwoko, Agnes I, NP  cephALEXin (KEFLEX) 500 MG capsule Take 1 capsule (500 mg total) by mouth 3 (three) times daily. 11/05/18   Sandford Craze'Sullivan, Melissa, NP    haloperidol (HALDOL) 5 MG tablet Take 1 tablet (5 mg total) by mouth 2 (two) times daily. For mood control 08/06/18   Armandina StammerNwoko, Agnes I, NP  hydrOXYzine (ATARAX/VISTARIL) 25 MG tablet Take 1 tablet (25 mg total) by mouth 3 (three) times daily as needed for anxiety. 08/06/18   Armandina StammerNwoko, Agnes I, NP  loratadine (CLARITIN) 10 MG tablet Take 1 tablet (10 mg total) by mouth daily. 09/24/18   Sandford Craze'Sullivan, Melissa, NP  lovastatin (MEVACOR) 20 MG tablet TAKE 1 TABLET (20 MG TOTAL) BY MOUTH AT BEDTIME. 10/01/18   Sandford Craze'Sullivan, Melissa, NP  meloxicam (MOBIC) 7.5 MG tablet Take 1 tablet (7.5 mg total) by mouth daily. 08/20/18   Sandford Craze'Sullivan, Melissa, NP  pantoprazole (PROTONIX) 40 MG tablet Take 1 tablet (40 mg total) by mouth 2 (two) times daily before a meal. 09/22/18   Lemmon, Violet BaldyJennifer Lynne, PA  pravastatin (PRAVACHOL) 20 MG tablet Take 1 tablet (20 mg total) by mouth daily at 6 PM. For high cholesterol 08/06/18   Armandina StammerNwoko, Agnes I, NP  traZODone (DESYREL) 100 MG tablet Take 1 tablet (100 mg total) by mouth at bedtime as needed for sleep. 08/06/18   Sanjuana KavaNwoko, Agnes I, NP    Family History Family History  Problem Relation Age of Onset  . Hypertension Father   . Hyperlipidemia Father   . Hypertension Paternal Grandmother   . Hypertension Paternal Grandfather   . Hypertension Brother  identical twin  . Psychosis Brother        "mental issues"      Social History Social History   Tobacco Use  . Smoking status: Current Every Day Smoker    Types: Cigarettes  . Smokeless tobacco: Never Used  . Tobacco comment: 10-15  Substance Use Topics  . Alcohol use: Yes    Alcohol/week: 0.0 standard drinks    Comment: occasionally --"I need to drink more"  . Drug use: Yes    Types: Marijuana    Comment: Pt endorsed used of marijuana     Allergies   Geodon [ziprasidone hcl]   Review of Systems Review of Systems  Unable to perform ROS: Psychiatric disorder     Physical Exam Updated Vital Signs BP 105/70 (BP  Location: Left Arm)   Pulse 72   Temp 98.4 F (36.9 C) (Oral)   Resp 18   SpO2 98%   Physical Exam Vitals signs and nursing note reviewed.  Constitutional:      Appearance: He is well-developed.  HENT:     Head: Normocephalic and atraumatic.  Eyes:     Conjunctiva/sclera: Conjunctivae normal.  Neck:     Musculoskeletal: Normal range of motion and neck supple.  Pulmonary:     Effort: No respiratory distress.  Skin:    General: Skin is warm and dry.  Neurological:     Mental Status: He is alert.  Psychiatric:        Attention and Perception: He is inattentive. He perceives auditory hallucinations.        Mood and Affect: Mood is anxious. Affect is labile.        Speech: Speech is rapid and pressured.        Behavior: Behavior is agitated.        Thought Content: Thought content is paranoid. Thought content does not include homicidal or suicidal ideation.      ED Treatments / Results  Labs (all labs ordered are listed, but only abnormal results are displayed) Labs Reviewed  COMPREHENSIVE METABOLIC PANEL - Abnormal; Notable for the following components:      Result Value   BUN 31 (*)    Creatinine, Ser 1.67 (*)    Total Protein 8.4 (*)    Albumin 5.3 (*)    AST 193 (*)    ALT 107 (*)    Total Bilirubin 2.2 (*)    GFR calc non Af Amer 53 (*)    All other components within normal limits  CBC - Abnormal; Notable for the following components:   WBC 12.6 (*)    All other components within normal limits  ETHANOL  RAPID URINE DRUG SCREEN, HOSP PERFORMED    EKG None  Radiology No results found.  Procedures Procedures (including critical care time)  Medications Ordered in ED Medications  haloperidol (HALDOL) tablet 10 mg (has no administration in time range)  haloperidol lactate (HALDOL) injection 5 mg (5 mg Intramuscular Given 12/20/18 1649)  diphenhydrAMINE (BENADRYL) injection 50 mg (0 mg Intramuscular Hold 12/20/18 1844)  benztropine (COGENTIN) tablet 0.5  mg (0 mg Oral Hold 12/20/18 1844)  LORazepam (ATIVAN) injection 2 mg (2 mg Intramuscular Given 12/20/18 1650)     Initial Impression / Assessment and Plan / ED Course  I have reviewed the triage vital signs and the nursing notes.  Pertinent labs & imaging results that were available during my care of the patient were reviewed by me and considered in my medical decision making (see chart  for details).     Patient seen and examined. Work-up initiated. Medications ordered.   Vital signs reviewed and are as follows: BP 105/70 (BP Location: Left Arm)   Pulse 72   Temp 98.4 F (36.9 C) (Oral)   Resp 18   SpO2 98%   4:50 PM patient has become increasingly more agitated.  He is refusing to take medications.  I have medications ordered.  Involuntary commitment paperwork completed and signed by Dr. Clayborne DanaMesner.  8:27 PM Patient pending inpatient admission.   I reviewed labs results. He has slightly elevated creatinine compared to previous. This does not warrant admission but would maintain good PO fluid intake and ideally have rechecked by PCP in the next week. Pt otherwise medically cleared.    Final Clinical Impressions(s) / ED Diagnoses   Final diagnoses:  Schizophrenia, unspecified type (HCC)  Acute psychosis (HCC)  Agitation   Admit inpatient psych.  ED Discharge Orders    None       Renne CriglerGeiple, Danaye Sobh, Cordelia Poche-C 12/20/18 2029    Tilden Fossaees, Elizabeth, MD 12/20/18 2356

## 2018-12-20 NOTE — ED Notes (Addendum)
Pt medicated with security and [employer].  Pt IVC'd by physiican due to aggitation.  Benedryl not given at this time due to it not being ordered at time other meds were administered.  In order to not aggitate patient further will wait and re-evaluate this patient shortly.  Pt naked in his room, speech with flight of ideas.

## 2018-12-21 ENCOUNTER — Other Ambulatory Visit: Payer: Self-pay

## 2018-12-21 ENCOUNTER — Encounter (HOSPITAL_COMMUNITY): Payer: Self-pay

## 2018-12-21 ENCOUNTER — Ambulatory Visit: Payer: Medicare HMO | Admitting: Physical Therapy

## 2018-12-21 ENCOUNTER — Inpatient Hospital Stay (HOSPITAL_COMMUNITY)
Admission: AD | Admit: 2018-12-21 | Discharge: 2018-12-24 | DRG: 885 | Disposition: A | Discharge status: Home / Self Care | Payer: Medicare HMO | Source: Intra-hospital | Attending: Psychiatry | Admitting: Psychiatry

## 2018-12-21 DIAGNOSIS — F200 Paranoid schizophrenia: Secondary | ICD-10-CM | POA: Diagnosis not present

## 2018-12-21 DIAGNOSIS — Z8349 Family history of other endocrine, nutritional and metabolic diseases: Secondary | ICD-10-CM

## 2018-12-21 DIAGNOSIS — I10 Essential (primary) hypertension: Secondary | ICD-10-CM | POA: Diagnosis not present

## 2018-12-21 DIAGNOSIS — Z9119 Patient's noncompliance with other medical treatment and regimen: Secondary | ICD-10-CM

## 2018-12-21 DIAGNOSIS — Z79899 Other long term (current) drug therapy: Secondary | ICD-10-CM

## 2018-12-21 DIAGNOSIS — E785 Hyperlipidemia, unspecified: Secondary | ICD-10-CM | POA: Diagnosis present

## 2018-12-21 DIAGNOSIS — Z8249 Family history of ischemic heart disease and other diseases of the circulatory system: Secondary | ICD-10-CM | POA: Diagnosis not present

## 2018-12-21 DIAGNOSIS — F319 Bipolar disorder, unspecified: Secondary | ICD-10-CM | POA: Diagnosis not present

## 2018-12-21 DIAGNOSIS — F17210 Nicotine dependence, cigarettes, uncomplicated: Secondary | ICD-10-CM | POA: Diagnosis present

## 2018-12-21 DIAGNOSIS — F1220 Cannabis dependence, uncomplicated: Secondary | ICD-10-CM | POA: Diagnosis not present

## 2018-12-21 DIAGNOSIS — Z888 Allergy status to other drugs, medicaments and biological substances status: Secondary | ICD-10-CM | POA: Diagnosis not present

## 2018-12-21 DIAGNOSIS — G4700 Insomnia, unspecified: Secondary | ICD-10-CM | POA: Diagnosis present

## 2018-12-21 DIAGNOSIS — R451 Restlessness and agitation: Secondary | ICD-10-CM | POA: Diagnosis not present

## 2018-12-21 DIAGNOSIS — F23 Brief psychotic disorder: Secondary | ICD-10-CM | POA: Diagnosis not present

## 2018-12-21 DIAGNOSIS — F209 Schizophrenia, unspecified: Secondary | ICD-10-CM | POA: Diagnosis present

## 2018-12-21 LAB — RAPID URINE DRUG SCREEN, HOSP PERFORMED
Amphetamines: NOT DETECTED
BARBITURATES: NOT DETECTED
BENZODIAZEPINES: NOT DETECTED
Cocaine: NOT DETECTED
Opiates: NOT DETECTED
TETRAHYDROCANNABINOL: POSITIVE — AB

## 2018-12-21 MED ORDER — ONDANSETRON 4 MG PO TBDP
4.0000 mg | ORAL_TABLET | Freq: Once | ORAL | Status: AC
  Administered 2018-12-21: 4 mg via ORAL
  Filled 2018-12-21: qty 1

## 2018-12-21 MED ORDER — LORATADINE 10 MG PO TABS
10.0000 mg | ORAL_TABLET | Freq: Every day | ORAL | Status: DC
  Administered 2018-12-21 – 2018-12-24 (×4): 10 mg via ORAL
  Filled 2018-12-21 (×7): qty 1

## 2018-12-21 MED ORDER — PRAVASTATIN SODIUM 20 MG PO TABS
20.0000 mg | ORAL_TABLET | Freq: Every day | ORAL | Status: DC
  Administered 2018-12-21 – 2018-12-22 (×2): 20 mg via ORAL
  Filled 2018-12-21 (×3): qty 1

## 2018-12-21 MED ORDER — HALOPERIDOL LACTATE 5 MG/ML IJ SOLN: 5.0000 mg | Freq: Four times a day (QID) | INTRAMUSCULAR | Status: DC | PRN

## 2018-12-21 MED ORDER — TRAZODONE HCL 100 MG PO TABS
200.0000 mg | ORAL_TABLET | Freq: Every day | ORAL | Status: DC
  Administered 2018-12-22 – 2018-12-23 (×3): 200 mg via ORAL
  Filled 2018-12-21 (×5): qty 2

## 2018-12-21 MED ORDER — BENZTROPINE MESYLATE 0.5 MG PO TABS
0.5000 mg | ORAL_TABLET | Freq: Two times a day (BID) | ORAL | Status: DC
  Administered 2018-12-21 – 2018-12-23 (×4): 0.5 mg via ORAL
  Filled 2018-12-21 (×6): qty 1

## 2018-12-21 MED ORDER — LORAZEPAM 2 MG/ML IJ SOLN
2.0000 mg | Freq: Once | INTRAMUSCULAR | Status: AC
  Administered 2018-12-21: 2 mg via INTRAMUSCULAR
  Filled 2018-12-21: qty 1

## 2018-12-21 MED ORDER — ONDANSETRON 4 MG PO TBDP
4.0000 mg | ORAL_TABLET | Freq: Four times a day (QID) | ORAL | Status: DC | PRN
  Administered 2018-12-21: 4 mg via ORAL
  Filled 2018-12-21: qty 1

## 2018-12-21 MED ORDER — ACETAMINOPHEN 325 MG PO TABS: 650.0000 mg | ORAL_TABLET | Freq: Four times a day (QID) | ORAL | Status: DC | PRN

## 2018-12-21 MED ORDER — AMLODIPINE BESYLATE 5 MG PO TABS
5.0000 mg | ORAL_TABLET | Freq: Every day | ORAL | Status: DC
  Administered 2018-12-21 – 2018-12-24 (×4): 5 mg via ORAL
  Filled 2018-12-21 (×7): qty 1

## 2018-12-21 MED ORDER — HYDROXYZINE HCL 25 MG PO TABS
25.0000 mg | ORAL_TABLET | Freq: Three times a day (TID) | ORAL | Status: DC | PRN
  Administered 2018-12-22 – 2018-12-23 (×2): 25 mg via ORAL

## 2018-12-21 MED ORDER — DIPHENHYDRAMINE HCL 50 MG/ML IJ SOLN
50.0000 mg | Freq: Once | INTRAMUSCULAR | Status: AC
  Administered 2018-12-21: 50 mg via INTRAMUSCULAR
  Filled 2018-12-21 (×2): qty 1

## 2018-12-21 MED ORDER — MAGNESIUM HYDROXIDE 400 MG/5ML PO SUSP: 30.0000 mL | Freq: Every day | ORAL | Status: DC | PRN

## 2018-12-21 MED ORDER — HALOPERIDOL 5 MG PO TABS
10.0000 mg | ORAL_TABLET | Freq: Two times a day (BID) | ORAL | Status: DC
  Administered 2018-12-21 – 2018-12-22 (×2): 10 mg via ORAL
  Filled 2018-12-21 (×4): qty 2

## 2018-12-21 MED ORDER — ALUM & MAG HYDROXIDE-SIMETH 200-200-20 MG/5ML PO SUSP: 30.0000 mL | ORAL | Status: DC | PRN

## 2018-12-21 MED ORDER — TRAZODONE HCL 100 MG PO TABS: 100.0000 mg | ORAL_TABLET | Freq: Every evening | ORAL | Status: DC | PRN

## 2018-12-21 NOTE — ED Notes (Signed)
Up in room, calm, redirectable but disorganized.

## 2018-12-21 NOTE — Tx Team (Signed)
Initial Treatment Plan 12/21/2018 12:25 PM Lucas Knapp RUE:[id number]    PATIENT STRESSORS: Financial difficulties Health problems   PATIENT STRENGTHS: Motivation for treatment/growth   PATIENT IDENTIFIED PROBLEMS: "getting on my meds"  "working on my pain"                   DISCHARGE CRITERIA:  Ability to meet basic life and health needs Adequate post-discharge living arrangements Improved stabilization in mood, thinking, and/or behavior  PRELIMINARY DISCHARGE PLAN: Return to previous living arrangement  PATIENT/FAMILY INVOLVEMENT: This treatment plan has been presented to and reviewed with the patient, Lucas Knapp.  The patient and family have been given the opportunity to ask questions and make suggestions.  Raylene MiyamotoMichael R Jini Horiuchi, RN 12/21/2018, 12:25 PM

## 2018-12-21 NOTE — ED Notes (Signed)
Pt reports that his stomach "feels good" drinking PO fluids well.

## 2018-12-21 NOTE — Progress Notes (Signed)
Patient arrived to unit today; demonstrating disorganized thinking and some delusions. As such, CSW will attempt to complete assessment at a later time to allow patient to orient to unit.  Lucas Cutterharlotte Idalia Allbritton, LCSW-A Clinical Social Worker

## 2018-12-21 NOTE — ED Notes (Signed)
Report called to Bettey MareMichael Scare RN, [place] [facility]

## 2018-12-21 NOTE — ED Notes (Signed)
Pt medicated w/o difficulty, laying on bed writing.  Pt rambling about his brother and his father and his medications.  NAD,  Is aware that he is going to be admitted.

## 2018-12-21 NOTE — BH Assessment (Signed)
[facility] Assessment Progress Note  Per Cristino MartesNadeem Aktar, MD, this pt requires psychiatric hospitalization.  Berneice Heinrichina Tate, RN, [facility]; [facility] any time before 12:00.  Pt presents under IVC initiated by EDP Marily MemosJason Mesner, MD, and [facility].  Pt's nurse, Wille CelesteJanie, has been notified, and agrees to call report to [phone].  Pt is to be transported via [employer].   Doylene Canninghomas Nelson Julson, [place] Behavioral Health Coordinator [phone]

## 2018-12-21 NOTE — H&P (Signed)
Psychiatric Admission Assessment Adult  Patient Identification: Lucas Knapp MRN:  [id number] Date of Evaluation:  12/21/2018 Chief Complaint:  SCHIZOPHRENIA Principal Diagnosis: Exacerbation and underlying paranoid schizophrenic condition, cannabis dependency Diagnosis:  Active Problems:   Paranoid schizophrenia (HCC)  History of Present Illness:  [name] is well-known to the service he was last admitted in August of this year.  He has a history of chronic paranoid schizophrenia, chronic cannabis dependency, and presented through the emergency department on referral from [facility] yesterday afternoon.  His chief complaint involving off of his medication for 4 days, and reporting hallucinations hearing specifically bomb blast and was very delusional believing he been poisoned he been trying to show examiners where he been injected with poison through his legs.  He has required IM medications in the emergency department to include Benadryl and lorazepam but no antipsychotics yet. He received Haldol Decanoate 100 mg intramuscularly prior to his discharge on 8/16 it is unclear if he has had one recently Current med list is supposed to contain Haldol 5 mg twice a day, hydrochlorothiazide 12.5 mg daily, Vistaril 25 mg 3 times daily as needed and Cogentin 0.5 twice daily, pravastatin 20 mg a day, trazodone 100 mg at bedtime as needed  The patient is disorganized and he is a poor historian at first he tells me he wants to take Seroquel that he tells me he will take Haldol  He is no longer talking about being poisoned but states it is "other people's problems..." and that is why he is here  Because he is disorganized I cannot give much more information than the above this is the note from our assessment team  Lucas Knapp is an 32 y.o. male that presents after going to [facility] earlier this date for a mental health evaluation. Patient was referred to [facility] after [facility] contacted [facility] due  to patient's current altered mental state. Per notes patient was observed to be acting bizarre and paranoid. On arrival to [facility] patient is speaking in a loud pressured voice being tangential and continues to ruminate on his "blood being poisoned." Patient attempts to show this writer his legs pointing to areas where he has "been injected with poison." This writer attempts to redirect patient unsuccessfully. Patient will not stay in his room and continues to be fixated on delusional somatic issues. It is unclear where the patient is receiving services from although patient states he is currently off his medications. Patient will not render any further information and is unable to be redirected. Per history review, patient was last seen on 07/27/18 presenting with similar symptoms at that time to include flight of ideas and hallucinations. Per review of that note, history was also limited on the patient at that time also. Patient denies any SA history per previous assessment with UDS pending this date although tested positive for THC on 07/27/18. Patient's moodis bizzare, apprehensive, suspicious and tangential. Displays active flight of ideas and will not answer any questions associated with assessment due to current mental state. Patient's behaviors are observed to be escalating with patient getting increasingly agitated the more this writer attempts to gather information. Patient appears to be responding to internal stimuli as evidenced by patient putting his fingers in his ears saying "stop it" and points to the wall asking if this writer "sees the bad guys." Information to complete assessment was obtained from admission notes and history. Patient per notes has a history of schizophrenia, states that he has been off his medications for 4 days,  presents from [place] wanting to talk with somebody. Denies active suicidal or homicidal ideations. States that he has been having hallucinations, hearing bomb blasts. Also  states that he has poison in his leg. Due to patient's agitation Lucas Knapp contacted EDP to initiate a IVC. Status pending IVC in progress. Lucas Knapp recommends a inpatient admission to assist with stabilization.     Associated Signs/Symptoms: Depression Symptoms:  insomnia, (Hypo) Manic Symptoms:  Delusions, Distractibility, Anxiety Symptoms:  Specific Phobias, related to the delusion of being poisoned Psychotic Symptoms:  Delusions, Hallucinations: Auditory PTSD Symptoms: NA Total Time spent with patient: 45 minutes  Past Psychiatric History: Extensive with extensive history of noncompliance  Is the patient at risk to self? No.  Has the patient been a risk to self in the past 6 months? No.  Has the patient been a risk to self within the distant past? No.  Is the patient a risk to others? No.  Has the patient been a risk to others in the past 6 months? No.  Has the patient been a risk to others within the distant past? No.   Prior Inpatient Therapy:   Prior Outpatient Therapy:    Alcohol Screening: 1. How often do you have a drink containing alcohol?: Never 2. How many drinks containing alcohol do you have on a typical day when you are drinking?: 1 or 2 3. How often do you have six or more drinks on one occasion?: Never AUDIT-C Score: 0 4. How often during the last year have you found that you were not able to stop drinking once you had started?: Never 5. How often during the last year have you failed to do what was normally expected from you becasue of drinking?: Never 6. How often during the last year have you needed a first drink in the morning to get yourself going after a heavy drinking session?: Never 7. How often during the last year have you had a feeling of guilt of remorse after drinking?: Never 8. How often during the last year have you been unable to remember what happened the night before because you had been drinking?: Never 9. Have you or someone else been injured as  a result of your drinking?: No 10. Has a relative or friend or a doctor or another health worker been concerned about your drinking or suggested you cut down?: No Alcohol Use Disorder Identification Test Final Score (AUDIT): 0 Substance Abuse History in the last 12 months:  Yes.   Consequences of Substance Abuse: Medical Consequences:  Probable contributor to exacerbation and psychotic disorder Previous Psychotropic Medications: Yes  Psychological Evaluations: No  Past Medical History:  Past Medical History:  Diagnosis Date  . Back pain   . Bipolar 1 disorder (HCC)   . Hyperlipidemia   . Hypertension    pt has been prescribed HCTZ 12.5 mg daily. Pt was 140/80 on admission.   . Schizophrenia (HCC)    History reviewed. No pertinent surgical history. Family History:  Family History  Problem Relation Age of Onset  . Hypertension Father   . Hyperlipidemia Father   . Hypertension Paternal Grandmother   . Hypertension Paternal Grandfather   . Hypertension Brother        identical twin  . Psychosis Brother        "mental issues"     Family Psychiatric  History: Patient is very vague about this states that his family does have psychiatric issues but he is unable to name a specific  diagnosis states "it is on the other side" of the family but again I think he is just delusional Tobacco Screening:   Social History:  Social History   Substance and Sexual Activity  Alcohol Use Yes  . Alcohol/week: 0.0 standard drinks   Comment: occasionally --"I need to drink more"     Social History   Substance and Sexual Activity  Drug Use Yes  . Types: Marijuana   Comment: Pt endorsed used of marijuana    Additional Social History:                           Allergies:   Allergies  Allergen Reactions  . Geodon [Ziprasidone Hcl] Other (See Comments)    Reaction:  Made pts throat dry    Lab Results:  Results for orders placed or performed during the hospital encounter of 12/20/18  (from the past 48 hour(s))  Rapid urine drug screen (hospital performed)     Status: Abnormal   Collection Time: 12/20/18  2:58 PM  Result Value Ref Range   Opiates NONE DETECTED NONE DETECTED   Cocaine NONE DETECTED NONE DETECTED   Benzodiazepines NONE DETECTED NONE DETECTED   Amphetamines NONE DETECTED NONE DETECTED   Tetrahydrocannabinol POSITIVE (A) NONE DETECTED   Barbiturates NONE DETECTED NONE DETECTED    Comment: (NOTE) DRUG SCREEN FOR MEDICAL PURPOSES ONLY.  IF CONFIRMATION IS NEEDED FOR ANY PURPOSE, NOTIFY LAB WITHIN 5 DAYS. LOWEST DETECTABLE LIMITS FOR URINE DRUG SCREEN Drug Class                     Cutoff (ng/mL) Amphetamine and metabolites    1000 Barbiturate and metabolites    200 Benzodiazepine                 200 Tricyclics and metabolites     300 Opiates and metabolites        300 Cocaine and metabolites        300 THC                            50 Performed at [facility], [address] [place], [place] [address]   Comprehensive metabolic panel     Status: Abnormal   Collection Time: 12/20/18  3:45 PM  Result Value Ref Range   Sodium 137 135 - 145 mmol/L   Potassium 4.1 3.5 - 5.1 mmol/L   Chloride 98 98 - 111 mmol/L   CO2 24 22 - 32 mmol/L   Glucose, Bld 94 70 - 99 mg/dL   BUN 31 (H) 6 - 20 mg/dL   Creatinine, Ser 2.131.67 (H) 0.61 - 1.24 mg/dL   Calcium 9.8 8.9 - 08.610.3 mg/dL   Total Protein 8.4 (H) 6.5 - 8.1 g/dL   Albumin 5.3 (H) 3.5 - 5.0 g/dL   AST 578193 (H) 15 - 41 U/L   ALT 107 (H) 0 - 44 U/L   Alkaline Phosphatase 66 38 - 126 U/L   Total Bilirubin 2.2 (H) 0.3 - 1.2 mg/dL   GFR calc non Af Amer 53 (L) >60 mL/min   GFR calc Af Amer >60 >60 mL/min   Anion gap 15 5 - 15    Comment: Performed at [facility], [address] [place], [place] [address]  Ethanol     Status: None   Collection Time: 12/20/18  3:45 PM  Result Value  Ref Range   Alcohol, Ethyl (B) <10 <10 mg/dL    Comment: (NOTE) Lowest detectable  limit for serum alcohol is 10 mg/dL. For medical purposes only. Performed at [facility], [address] [place], [place] [address]   cbc     Status: Abnormal   Collection Time: 12/20/18  3:45 PM  Result Value Ref Range   WBC 12.6 (H) 4.0 - 10.5 K/uL   RBC 5.50 4.22 - 5.81 MIL/uL   Hemoglobin 16.6 13.0 - 17.0 g/dL   HCT 60.448.2 54.039.0 - 98.152.0 %   MCV 87.6 80.0 - 100.0 fL   MCH 30.2 26.0 - 34.0 pg   MCHC 34.4 30.0 - 36.0 g/dL   RDW 19.112.3 47.811.5 - 29.515.5 %   Platelets 369 150 - 400 K/uL   nRBC 0.0 0.0 - 0.2 %    Comment: Performed at [facility], [address] [place], [place] [address]    Blood Alcohol level:  Lab Results  Component Value Date   [facility] <10 12/20/2018   ETH <10 07/27/2018    Metabolic Disorder Labs:  Lab Results  Component Value Date   HGBA1C 5.1 07/29/2018   MPG 99.67 07/29/2018   Lab Results  Component Value Date   PROLACTIN 30.0 (H) 07/29/2018   Lab Results  Component Value Date   CHOL 249 (H) 03/29/2018   TRIG 239.0 (H) 03/29/2018   HDL 34.70 (L) 03/29/2018   CHOLHDL 7 03/29/2018   VLDL 47.8 (H) 03/29/2018   LDLCALC 150 (H) 09/29/2016    Current Medications: Current Facility-Administered Medications  Medication Dose Route Frequency Provider Last Rate Last Dose  . acetaminophen (TYLENOL) tablet 650 mg  650 mg Oral Q6H PRN Maryagnes AmosStarkes-Perry, Takia S, FNP      . alum & mag hydroxide-simeth (MAALOX/MYLANTA) 200-200-20 MG/5ML suspension 30 mL  30 mL Oral Q4H PRN Starkes-Perry, Juel Burrowakia S, FNP      . amLODipine (NORVASC) tablet 5 mg  5 mg Oral Daily Maryagnes AmosStarkes-Perry, Takia S, FNP   5 mg at 12/21/18 1258  . benztropine (COGENTIN) tablet 0.5 mg  0.5 mg Oral BID Maryagnes AmosStarkes-Perry, Takia S, FNP      . haloperidol (HALDOL) tablet 10 mg  10 mg Oral BID Caryn BeeStarkes-Perry, Takia S, FNP      . haloperidol lactate (HALDOL) injection 5 mg  5 mg Intramuscular Q6H PRN Maryagnes AmosStarkes-Perry, Takia S, FNP      . hydrOXYzine (ATARAX/VISTARIL) tablet 25 mg  25  mg Oral TID PRN Maryagnes AmosStarkes-Perry, Takia S, FNP      . loratadine (CLARITIN) tablet 10 mg  10 mg Oral Daily Maryagnes AmosStarkes-Perry, Takia S, FNP   10 mg at 12/21/18 1258  . magnesium hydroxide (MILK OF MAGNESIA) suspension 30 mL  30 mL Oral Daily PRN Maryagnes AmosStarkes-Perry, Takia S, FNP      . pravastatin (PRAVACHOL) tablet 20 mg  20 mg Oral q1800 Starkes-Perry, Juel Burrowakia S, FNP      . traZODone (DESYREL) tablet 100 mg  100 mg Oral QHS PRN Maryagnes AmosStarkes-Perry, Takia S, FNP       PTA Medications: Medications Prior to Admission  Medication Sig Dispense Refill Last Dose  . amLODipine (NORVASC) 5 MG tablet Take 1 tablet (5 mg total) by mouth daily. 30 tablet 3 Past Month at Unknown time  . haloperidol (HALDOL) 5 MG tablet Take 1 tablet (5 mg total) by mouth 2 (two) times daily. For mood control 60 tablet 0 12/19/2018 at Unknown time  . hydrOXYzine (ATARAX/VISTARIL) 25 MG tablet  Take 1 tablet (25 mg total) by mouth 3 (three) times daily as needed for anxiety. 60 tablet 0 12/19/2018 at Unknown time  . loratadine (CLARITIN) 10 MG tablet Take 1 tablet (10 mg total) by mouth daily. (Patient not taking: Reported on 12/20/2018) 30 tablet 11 Not Taking at Unknown time  . lovastatin (MEVACOR) 20 MG tablet TAKE 1 TABLET (20 MG TOTAL) BY MOUTH AT BEDTIME. 90 tablet 0 Past Month at Unknown time  . meloxicam (MOBIC) 7.5 MG tablet Take 1 tablet (7.5 mg total) by mouth daily. 14 tablet 0 12/19/2018 at Unknown time  . naproxen sodium (ALEVE) 220 MG tablet Take 220-440 mg by mouth 2 (two) times daily as needed (pain).   12/19/2018 at Unknown time  . pantoprazole (PROTONIX) 40 MG tablet Take 1 tablet (40 mg total) by mouth 2 (two) times daily before a meal. 60 tablet 3 12/19/2018 at Unknown time  . pravastatin (PRAVACHOL) 20 MG tablet Take 1 tablet (20 mg total) by mouth daily at 6 PM. For high cholesterol 10 tablet 0 Past Month at Unknown time  . traZODone (DESYREL) 100 MG tablet Take 1 tablet (100 mg total) by mouth at bedtime as needed for sleep.  30 tablet 0 12/19/2018 at Unknown time    Musculoskeletal: Strength & Muscle Tone: decreased Gait & Station: shuffles  Psychiatric Specialty Exam: Physical Exam no EPS or TD see ER physical for full details  ROS no history of seizures or head trauma by report does have a history of hypertension  Blood pressure (!) 145/80, pulse (!) 104, temperature 99 F (37.2 C), temperature source Oral, resp. rate 18, height 5\' 4"  (1.626 m), weight 95.3 kg, SpO2 100 %.Body mass index is 36.05 kg/m.  General Appearance: Casual  Eye Contact:  Fair  Speech:  Slow and Slurred  Volume:  Decreased  Mood:  Dysphoric  Affect:  Constricted  Thought Process:  Disorganized  Orientation:  Other:  Person place general situation not day date or exact time of day  Thought Content:  Hallucinations: Auditory  Suicidal Thoughts:  No  Homicidal Thoughts:  No  Memory:  Immediate;   Poor  Judgement:  Impaired  Insight:  Shallow  Psychomotor Activity:  Decreased  Concentration:  Concentration: Poor  Recall:  Poor  Fund of Knowledge:  Poor  Language:  Poor  Akathisia:  Negative  Handed:  Right  AIMS (if indicated):     Assets:  Communication Skills Desire for Improvement  ADL's:  Intact  Cognition:  WNL  Sleep:       Treatment Plan Summary: Daily contact with patient to assess and evaluate symptoms and progress in treatment, Medication management and Plan For psychosis resume Haldol, for hypertension resume hydrochlorothiazide, continue current precautions reality based therapy investigate the last time he got his long-acting injectable and reinstitute if needed  Observation Level/Precautions:  15 minute checks  Laboratory:  UDS  Psychotherapy: Cognitive and reality based  Medications: Resume Haldol investigate long-acting injectable, review standard risk benefits and side effects when he is more able to understand them fully  Consultations: Not necessary  Discharge Concerns: Long-term compliance,  abstinence from cannabis  Estimated LOS: 5-7  Other: Chronic paranoid schizophrenia, acute exacerbation would be the exact diagnosis   Physician Treatment Plan for Primary Diagnosis: Psychosis in the context of chronic cannabis dependence and schizophrenia, chronic paranoid type Long Term Goal(s): Improvement in symptoms so as ready for discharge  Short Term Goals: Ability to identify changes in lifestyle to reduce recurrence  of condition will improve and Ability to disclose and discuss suicidal ideas  Physician Treatment Plan for Secondary Diagnosis: Active Problems:   Paranoid schizophrenia (HCC)  Long Term Goal(s): Improvement in symptoms so as ready for discharge  Short Term Goals: Ability to maintain clinical measurements within normal limits will improve and Compliance with prescribed medications will improve  I certify that inpatient services furnished can reasonably be expected to improve the patient's condition.    Malvin Johns, MD 12/31/20191:47 PM

## 2018-12-21 NOTE — ED Notes (Signed)
[employer] here to transport 

## 2018-12-21 NOTE — ED Notes (Signed)
[facility] physical therapy (ortho) contacted at patients request to cancel his appt for this morning.  Pt will call to reschedule.

## 2018-12-21 NOTE — ED Notes (Signed)
Pt agitated. Pt pressed emergency button in room. Pt states " I'm networking, I'm a Art gallery managerengineer". Pt given haldol and benadryl. Pt resting in bed, will continue to monitor.

## 2018-12-21 NOTE — ED Notes (Signed)
[employer] contacted for transport 

## 2018-12-21 NOTE — ED Notes (Signed)
Pt ambulatory w/o difficulty to [facility] with [employer], belongings given to officers. 

## 2018-12-21 NOTE — ED Notes (Addendum)
Pt up in the hall vomiting in the sink.  Pt reports that he was not able to get to the bathroom. Pt reports that this has been occuring for a while, and occurrs after he eats meals at times.  Pt reports that he has talked with his primary Md about it and was put on medication, but is not able to say what medication.  Pt reports stomach has improved after vomiting, ginger ale given.

## 2018-12-21 NOTE — Progress Notes (Signed)
Patient ID: Lucas Knapp, male   DOB: 11/20/1986, 32 y.o.   MRN: [id number] Pt is a 32 yo male that presents involuntarily on 12/21/18 with schizophrenia. Pt is allergic to Geodon. Pt is confused, tangential, pressured in speech, and unsteady. Pt wears a back brace and complains of lower back pain that he rates at a 9/10. Pt is a poor historian and keeps repeating a story involving a "Ken". Pt denies ever having si/hi/ah/vh and verbally agrees to approach staff if these become apparent or before harm[facility] himself [facility]. Pt has no complaints at this time besides pain. Pt was pointing at his head and asking someone what is the answer. Pt denied that there was a voice/command ah. Pt is pleasant and compliant.   Per a previous note:   Lucas Knapp is an 32 y.o. male that [facility] health [facility]. [facility] [employer] due to patient's current altered mental state. Per notes patient was observed to be acting bizarre and paranoid. [facility] patient is speaking in a loud pressured voice being tangential and continues to ruminate on his "blood being poisoned." Patient attempts to show this writer his legs pointing to areas where he has "been injected with poison." This writer attempts to redirect patient unsuccessfully. Patient will not stay in his room and continues to be fixated on delusional somatic issues. It is unclear where the patient is receiving services from although patient states he is currently off his medications. Patient will not render any further information and is unable to be redirected. Per history review, patient was last seen on 07/27/18 presenting with similar symptoms at that time to include flight of ideas and hallucinations. Per review of that note, history was also limited on the patient at that time also. Patient denies any SA history per previous assessment with UDS pending this date  although tested positive for THC on 07/27/18. Patient's moodis bizzare, apprehensive, suspicious and tangential. Displays active flight of ideas and will not answer any questions associated with assessment due to current mental state. Patient's behaviors are observed to be escalating with patient getting increasingly agitated the more this writer attempts to gather information. Patient appears to be responding to internal stimuli as evidenced by patient putting his fingers in his ears saying "stop it" and points to the wall asking if this writer "sees the bad guys." Information to complete assessment was obtained from admission notes and history. Patient per notes has a history of schizophrenia, states that he has been off his medications for 4 days, presents [facility] to talk with somebody. Denies active suicidal or homicidal ideations. States that he has been having hallucinations, hearing bomb blasts. Also states that he has poison in his leg. Due to patient's agitation Lord DNP contacted EDP to initiate a IVC. Status pending IVC in progress. Lord DNP recommends a inpatient admission to assist with stabilization.   Consents signed, skin/belongings search completed and patient oriented to unit. Patient stable at this time. Patient given the opportunity to express concerns and ask questions. Patient given toiletries. Will continue to monitor.

## 2018-12-21 NOTE — ED Notes (Signed)
Up to the desk, patient reports that he ran out of his haldol 4 days ago, and that his provider was going to change his medication

## 2018-12-21 NOTE — [facility] Suicide Risk Assessment (Signed)
[facility]   Nursing information obtained from:  Patient Demographic factors:  Male, Low socioeconomic status Current Mental Status:  NA Loss Factors:  Decline in physical health Historical Factors:  Prior suicide attempts, Impulsivity Risk Reduction Factors:  Positive coping skills or problem solving skills  Total Time spent with patient: 45 minutes Principal Problem: Psychosis in the context of possible noncompliance regarding antipsychotic medications for schizophrenia, and cannabis dependence Diagnosis:  Active Problems:   Paranoid schizophrenia (HCC)  Subjective Data: Masturbation and underlying psychotic disorder currently disorganized  Continued Clinical Symptoms:  Alcohol Use Disorder Identification Test Final Score (AUDIT): 0 The "Alcohol Use Disorders Identification Test", Guidelines for Use in Primary Care, Second Edition.  World Science writerHealth Organization [facility](WHO). Score between 0-7:  no or low risk or alcohol related problems. Score between 8-15:  moderate risk of alcohol related problems. Score between 16-19:  high risk of alcohol related problems. Score 20 or above:  warrants further diagnostic evaluation for alcohol dependence and treatment.   CLINICAL FACTORS:   Schizophrenia:   Command hallucinatons       COGNITIVE FEATURES THAT CONTRIBUTE TO RISK:  Thought constriction (tunnel vision)    SUICIDE RISK:   Minimal: No identifiable suicidal ideation.  Patients presenting with no risk factors but with morbid ruminations; may be classified as minimal risk based on the severity of the depressive symptoms  PLAN OF CARE: Admitted for stabilization see current order list  I certify that inpatient services furnished can reasonably be expected to improve the patient's condition.   Malvin JohnsFARAH,Shemekia Patane, MD 12/21/2018, 1:56 PM

## 2018-12-21 NOTE — ED Notes (Addendum)
Dr Gilmore LarocheAkhtar and Fredna Dowakia NP into see.  Pt states that Lucas Knapp was suppose to call in a new medication for him taking him off th e haldol.  Pt does not know what the medication was, and has not started it.  Pt reports that he has been off his meds for 4 days.  Pt alert/oriented, denies si/hi/avh at this time.  Pt reports that he is having trouble "processing things...slow..."  Pt lives with his twin brother.  Pt reports that he has been taking benadryl and haldol, and has agreed to take the medications orally.  Pt is aware that he is going to be admitted.  PT also reports that he has problems with food backing up from his stomach after he eats.

## 2018-12-21 NOTE — ED Notes (Signed)
On the phone 

## 2018-12-22 LAB — TSH: TSH: 1.609 u[IU]/mL (ref 0.350–4.500)

## 2018-12-22 LAB — LIPID PANEL
CHOL/HDL RATIO: 4.5 ratio
Cholesterol: 167 mg/dL (ref 0–200)
HDL: 37 mg/dL — ABNORMAL LOW (ref >40)
LDL Cholesterol: 112 mg/dL — ABNORMAL HIGH (ref 0–99)
Triglycerides: 90 mg/dL (ref <150)
VLDL: 18 mg/dL (ref 0–40)

## 2018-12-22 LAB — HEMOGLOBIN A1C
Hgb A1c MFr Bld: 5 % (ref 4.8–5.6)
Mean Plasma Glucose: 96.8 mg/dL

## 2018-12-22 MED ORDER — HALOPERIDOL 5 MG PO TABS
10.0000 mg | ORAL_TABLET | Freq: Every day | ORAL | Status: DC
  Administered 2018-12-23 – 2018-12-24 (×2): 10 mg via ORAL
  Filled 2018-12-22 (×4): qty 2

## 2018-12-22 MED ORDER — HALOPERIDOL 5 MG PO TABS
ORAL_TABLET | ORAL | Status: AC
  Filled 2018-12-22: qty 1

## 2018-12-22 MED ORDER — HALOPERIDOL 5 MG PO TABS
5.0000 mg | ORAL_TABLET | Freq: Four times a day (QID) | ORAL | Status: DC | PRN
  Administered 2018-12-22: 5 mg via ORAL
  Filled 2018-12-22: qty 1

## 2018-12-22 MED ORDER — HALOPERIDOL 5 MG PO TABS
15.0000 mg | ORAL_TABLET | Freq: Every day | ORAL | Status: DC
  Administered 2018-12-22 – 2018-12-23 (×2): 15 mg via ORAL
  Filled 2018-12-22 (×4): qty 3

## 2018-12-22 NOTE — Progress Notes (Addendum)
Patient has been increasingly bizarre and psychotic this evening. Initially at start of shift was tangential, disorganized but was pleasant. Observed sleeping in soiled underwear. Patient has become agitated, irritable, and intrusive. Now argumentative. Found standing in bathroom naked with nonsensical speech and toothpaste covering face.  Rhyming speech with flight of ideas. Insisted light needed to be off while patient was in bathroom. Turned on by this Clinical research associatewriter for safety however when patient was checked on, patient was found in shower with towel over back like a cape and water all over the bathroom floor. Patient denies AVH however is seen gesturing to the air and ceiling while talking to "Arlys JohnBrian."   Patient medicated per orders. Refuses trazadone. Redirected frequently. Fall prevention plan in place and attempts to review made.  Patient continues to stand in room naked, refusing to dress. No evidence of learning. Order for no roommate to be obtained from PA. No SI/HI voiced. Patient safe at this time. Will continue to monitor closely throughout the night.

## 2018-12-22 NOTE — Plan of Care (Signed)
Pt progress note  D: pt found in bed; allowed to rest. Pt complaint with medication administration. Pt failed to fill out his self inventory. Pt denies any si/hi and verbally agrees to approach staff if these become apparent or before harming himself or others. Pt does complain of lower back pain but refused tylenol, stating this interferes with his other meds. Pt denied any other symptoms but is still unsteady on his feet. Pt is still tangential at times. Pt states the item that is in his head is still sending and receiving signals.  A: pt provided support and encouragement. Pt given medication per protocol and standing orders. Q623m safety checks implemented and continued.  R: pt safe on the unit. Will continue to monitor.   Pt progressing in the following metrics  Problem: Education: Goal: Knowledge of [facility] Education information/materials will improve Outcome: Progressing Goal: Emotional status will improve Outcome: Progressing Goal: Mental status will improve Outcome: Progressing Goal: Verbalization of understanding the information provided will improve Outcome: Progressing

## 2018-12-22 NOTE — Tx Team (Signed)
Interdisciplinary Treatment and Diagnostic Plan Update  12/22/2018 Time of Session: 9:00am Lucas Knapp MRN: [id number]  Principal Diagnosis: <principal problem not specified>  Secondary Diagnoses: Active Problems:   Paranoid schizophrenia ([place])   Current Medications:  Current Facility-Administered Medications  Medication Dose Route Frequency Provider Last Rate Last Dose  . acetaminophen (TYLENOL) tablet 650 mg  650 mg Oral Q6H PRN Suella Broad, FNP      . alum & mag hydroxide-simeth (MAALOX/MYLANTA) 200-200-20 MG/5ML suspension 30 mL  30 mL Oral Q4H PRN Starkes-Perry, Gayland Curry, FNP      . amLODipine (NORVASC) tablet 5 mg  5 mg Oral Daily Suella Broad, FNP   5 mg at 12/22/18 0932  . benztropine (COGENTIN) tablet 0.5 mg  0.5 mg Oral BID Suella Broad, FNP   0.5 mg at 12/22/18 0932  . [START ON 12/23/2018] haloperidol (HALDOL) tablet 10 mg  10 mg Oral Daily Johnn Hai, MD      . haloperidol (HALDOL) tablet 15 mg  15 mg Oral QHS Johnn Hai, MD      . haloperidol (HALDOL) tablet 5 mg  5 mg Oral Q6H PRN Patriciaann Clan E, PA-C   5 mg at 12/22/18 0208  . haloperidol lactate (HALDOL) injection 5 mg  5 mg Intramuscular Q6H PRN Suella Broad, FNP      . hydrOXYzine (ATARAX/VISTARIL) tablet 25 mg  25 mg Oral TID PRN Suella Broad, FNP      . loratadine (CLARITIN) tablet 10 mg  10 mg Oral Daily Suella Broad, FNP   10 mg at 12/22/18 0932  . magnesium hydroxide (MILK OF MAGNESIA) suspension 30 mL  30 mL Oral Daily PRN Suella Broad, FNP      . ondansetron (ZOFRAN-ODT) disintegrating tablet 4 mg  4 mg Oral Q6H PRN Patriciaann Clan E, PA-C   4 mg at 12/21/18 2259  . pravastatin (PRAVACHOL) tablet 20 mg  20 mg Oral q1800 Suella Broad, FNP   20 mg at 12/21/18 2118  . traZODone (DESYREL) tablet 200 mg  200 mg Oral QHS Johnn Hai, MD   200 mg at 12/22/18 3354   PTA Medications: Medications Prior to Admission  Medication Sig  Dispense Refill Last Dose  . amLODipine (NORVASC) 5 MG tablet Take 1 tablet (5 mg total) by mouth daily. 30 tablet 3 Past Month at Unknown time  . haloperidol (HALDOL) 5 MG tablet Take 1 tablet (5 mg total) by mouth 2 (two) times daily. For mood control 60 tablet 0 12/19/2018 at Unknown time  . hydrOXYzine (ATARAX/VISTARIL) 25 MG tablet Take 1 tablet (25 mg total) by mouth 3 (three) times daily as needed for anxiety. 60 tablet 0 12/19/2018 at Unknown time  . loratadine (CLARITIN) 10 MG tablet Take 1 tablet (10 mg total) by mouth daily. (Patient not taking: Reported on 12/20/2018) 30 tablet 11 Not Taking at Unknown time  . lovastatin (MEVACOR) 20 MG tablet TAKE 1 TABLET (20 MG TOTAL) BY MOUTH AT BEDTIME. 90 tablet 0 Past Month at Unknown time  . meloxicam (MOBIC) 7.5 MG tablet Take 1 tablet (7.5 mg total) by mouth daily. 14 tablet 0 12/19/2018 at Unknown time  . naproxen sodium (ALEVE) 220 MG tablet Take 220-440 mg by mouth 2 (two) times daily as needed (pain).   12/19/2018 at Unknown time  . pantoprazole (PROTONIX) 40 MG tablet Take 1 tablet (40 mg total) by mouth 2 (two) times daily before a meal. 60 tablet 3 12/19/2018 at Unknown  time  . pravastatin (PRAVACHOL) 20 MG tablet Take 1 tablet (20 mg total) by mouth daily at 6 PM. For high cholesterol 10 tablet 0 Past Month at Unknown time  . traZODone (DESYREL) 100 MG tablet Take 1 tablet (100 mg total) by mouth at bedtime as needed for sleep. 30 tablet 0 12/19/2018 at Unknown time    Patient Stressors: Financial difficulties Health problems  Patient Strengths: Motivation for treatment/growth  Treatment Modalities: Medication Management, Group therapy, Case management,  1 to 1 session with clinician, Psychoeducation, Recreational therapy.   Physician Treatment Plan for Primary Diagnosis: <principal problem not specified> Long Term Goal(s): Improvement in symptoms so as ready for discharge Improvement in symptoms so as ready for discharge    Short Term Goals: Ability to identify changes in lifestyle to reduce recurrence of condition will improve Ability to disclose and discuss suicidal ideas Ability to maintain clinical measurements within normal limits will improve Compliance with prescribed medications will improve  Medication Management: Evaluate patient's response, side effects, and tolerance of medication regimen.  Therapeutic Interventions: 1 to 1 sessions, Unit Group sessions and Medication administration.  Evaluation of Outcomes: Not Met  Physician Treatment Plan for Secondary Diagnosis: Active Problems:   Paranoid schizophrenia ([place])  Long Term Goal(s): Improvement in symptoms so as ready for discharge Improvement in symptoms so as ready for discharge   Short Term Goals: Ability to identify changes in lifestyle to reduce recurrence of condition will improve Ability to disclose and discuss suicidal ideas Ability to maintain clinical measurements within normal limits will improve Compliance with prescribed medications will improve     Medication Management: Evaluate patient's response, side effects, and tolerance of medication regimen.  Therapeutic Interventions: 1 to 1 sessions, Unit Group sessions and Medication administration.  Evaluation of Outcomes: Not Met   RN Treatment Plan for Primary Diagnosis: <principal problem not specified> Long Term Goal(s): Knowledge of disease and therapeutic regimen to maintain health will improve  Short Term Goals: Ability to remain free from injury will improve, Ability to demonstrate self-control, Ability to verbalize feelings will improve, Ability to identify and develop effective coping behaviors will improve and Compliance with prescribed medications will improve  Medication Management: RN will administer medications as ordered by provider, will assess and evaluate patient's response and provide education to patient for prescribed medication. RN will report any adverse  and/or side effects to prescribing provider.  Therapeutic Interventions: 1 on 1 counseling sessions, Psychoeducation, Medication administration, Evaluate responses to treatment, Monitor vital signs and CBGs as ordered, Perform/monitor CIWA, COWS, AIMS and Fall Risk screenings as ordered, Perform wound care treatments as ordered.  Evaluation of Outcomes: Not Met   LCSW Treatment Plan for Primary Diagnosis: <principal problem not specified> Long Term Goal(s): Safe transition to appropriate next level of care at discharge, Engage patient in therapeutic group addressing interpersonal concerns.  Short Term Goals: Engage patient in aftercare planning with referrals and resources, Increase social support, Increase emotional regulation, Facilitate acceptance of mental health diagnosis and concerns, Identify triggers associated with mental health/substance abuse issues and Increase skills for wellness and recovery  Therapeutic Interventions: Assess for all discharge needs, 1 to 1 time with Social worker, Explore available resources and support systems, Assess for adequacy in community support network, Educate family and significant other(s) on suicide prevention, Complete Psychosocial Assessment, Interpersonal group therapy.  Evaluation of Outcomes: Not Met   Progress in Treatment: Attending groups: No. New to unit. Participating in groups: No. Taking medication as prescribed: No. Refusing trazadone. Toleration  medication: No. Family/Significant other contact made: No, will contact:  supports if consent is granted Patient understands diagnosis: Yes. Discussing patient identified problems/goals with staff: No. Medical problems stabilized or resolved: No. Denies suicidal/homicidal ideation: Yes. Issues/concerns per patient self-inventory: No.   New problem(s) identified: No, Describe:  Patient oriented x2, bizarre behavior, responding to internal stimuli.  New Short Term/Long Term Goal(s):  medication management for mood stabilization; elimination of SI thoughts; development of comprehensive mental wellness/sobriety plan.  Patient Goals:  "Getting on my meds."  Discharge Plan or Barriers: CSW continuing to assess. [facility] pamphlet, Mobile Crisis information, and AA/NA information provided to patient for additional community support and resources.   Reason for Continuation of Hospitalization: Anxiety Delusions  Depression Hallucinations Mania Medication stabilization  Estimated Length of Stay: 3-5 days  Attendees: Patient: 12/22/2018 9:51 AM  Physician: Dr.Farah 12/22/2018 9:51 AM  Nursing:  12/22/2018 9:51 AM  RN Care Manager: 12/22/2018 9:51 AM  Social Worker: Stephanie Acre, [place] 12/22/2018 9:51 AM  Recreational Therapist:  12/22/2018 9:51 AM  Other:  12/22/2018 9:51 AM  Other:  12/22/2018 9:51 AM  Other: 12/22/2018 9:51 AM    Scribe for Treatment Team: Joellen Jersey, [place] 12/22/2018 9:51 AM

## 2018-12-22 NOTE — Progress Notes (Signed)
[facility]  12/22/2018 9:40 AM Lucas Knapp  MRN:  [id number] Subjective:    Patient was more disorganized as the day progressed yesterday he is compliant with meds today he is not irritable and volatile or disorganized but is focused on discharge but short-term memory is impaired as discussed below can recall 2 of 3 and 0 of 3 he denies hearing and seeing things but again gives narratives that are disjointed and related to past delusional statements. Eyes hallucinations Alert and oriented to person place situation not exact date  Principal Problem: <principal problem not specified> Diagnosis: Active Problems:   Paranoid schizophrenia ([facility])  Total Time spent with patient: 20 minutes  Past Medical History:  Past Medical History:  Diagnosis Date  . Back pain   . Bipolar 1 disorder ([facility])   . Hyperlipidemia   . Hypertension    pt has been prescribed HCTZ 12.5 mg daily. Pt was 140/80 on admission.   . Schizophrenia ([facility])    History reviewed. No pertinent surgical history. Family History:  Family History  Problem Relation Age of Onset  . Hypertension Father   . Hyperlipidemia Father   . Hypertension Paternal Grandmother   . Hypertension Paternal Grandfather   . Hypertension Brother        identical twin  . Psychosis Brother        "mental issues"      Social History:  Social History   Substance and Sexual Activity  Alcohol Use Yes  . Alcohol/week: 0.0 standard drinks   Comment: occasionally --"I need to drink more"     Social History   Substance and Sexual Activity  Drug Use Yes  . Types: Marijuana   Comment: Pt endorsed used of marijuana    Social History   Socioeconomic History  . Marital status: Single    Spouse name: Not on file  . Number of children: Not on file  . Years of education: Not on file  . Highest education level: Not on file  Occupational History  . Not on file  Social Needs  . Financial resource strain: Not on file  . Food  insecurity:    Worry: Not on file    Inability: Not on file  . Transportation needs:    Medical: Not on file    Non-medical: Not on file  Tobacco Use  . Smoking status: Current Every Day Smoker    Types: Cigarettes  . Smokeless tobacco: Never Used  . Tobacco comment: 10-15  Substance and Sexual Activity  . Alcohol use: Yes    Alcohol/week: 0.0 standard drinks    Comment: occasionally --"I need to drink more"  . Drug use: Yes    Types: Marijuana    Comment: Pt endorsed used of marijuana  . Sexual activity: Not Currently  Lifestyle  . Physical activity:    Days per week: Not on file    Minutes per session: Not on file  . Stress: Not on file  Relationships  . Social connections:    Talks on phone: Not on file    Gets together: Not on file    Attends religious service: Not on file    Active member of club or organization: Not on file    Attends meetings of clubs or organizations: Not on file    Relationship status: Not on file  Other Topics Concern  . 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 Also works  for the [place] [employer] one day a week.    Single   No children   Completed college (bachelors in Statistician)   Enjoys swimming, playing pool, walking   Grew up [place].  Has identical twin and 2 other brothers in [place]   Additional Social History:                         Sleep: Fair  Appetite:  Fair  Current Medications: Current Facility-Administered Medications  Medication Dose Route Frequency Provider Last Rate Last Dose  . acetaminophen (TYLENOL) tablet 650 mg  650 mg Oral Q6H PRN Maryagnes Amos, FNP      . alum & mag hydroxide-simeth (MAALOX/MYLANTA) 200-200-20 MG/5ML suspension 30 mL  30 mL Oral Q4H PRN Starkes-Perry, Juel Burrow, FNP      . amLODipine (NORVASC) tablet 5 mg  5 mg Oral Daily Maryagnes Amos, FNP   5 mg at 12/22/18 0932  . benztropine (COGENTIN) tablet 0.5 mg  0.5 mg  Oral BID Maryagnes Amos, FNP   0.5 mg at 12/22/18 0932  . [START ON 12/23/2018] haloperidol (HALDOL) tablet 10 mg  10 mg Oral Daily Malvin Johns, MD      . haloperidol (HALDOL) tablet 15 mg  15 mg Oral QHS Malvin Johns, MD      . haloperidol (HALDOL) tablet 5 mg  5 mg Oral Q6H PRN Donell Sievert E, PA-C   5 mg at 12/22/18 0208  . haloperidol lactate (HALDOL) injection 5 mg  5 mg Intramuscular Q6H PRN Maryagnes Amos, FNP      . hydrOXYzine (ATARAX/VISTARIL) tablet 25 mg  25 mg Oral TID PRN Maryagnes Amos, FNP      . loratadine (CLARITIN) tablet 10 mg  10 mg Oral Daily Maryagnes Amos, FNP   10 mg at 12/22/18 0932  . magnesium hydroxide (MILK OF MAGNESIA) suspension 30 mL  30 mL Oral Daily PRN Maryagnes Amos, FNP      . ondansetron (ZOFRAN-ODT) disintegrating tablet 4 mg  4 mg Oral Q6H PRN Donell Sievert E, PA-C   4 mg at 12/21/18 2259  . pravastatin (PRAVACHOL) tablet 20 mg  20 mg Oral q1800 Maryagnes Amos, FNP   20 mg at 12/21/18 2118  . traZODone (DESYREL) tablet 200 mg  200 mg Oral QHS Malvin Johns, MD   200 mg at 12/22/18 1749    Lab Results:  Results for orders placed or performed during the hospital encounter of 12/21/18 (from the past 48 hour(s))  Hemoglobin A1c     Status: None   Collection Time: 12/22/18  6:37 AM  Result Value Ref Range   Hgb A1c MFr Bld 5.0 4.8 - 5.6 %    Comment: (NOTE) Pre diabetes:          5.7%-6.4% Diabetes:              >6.4% Glycemic control for   <7.0% adults with diabetes    Mean Plasma Glucose 96.8 mg/dL    Comment: Performed at [facility], [address], [place], [place] [address]  Lipid panel     Status: Abnormal   Collection Time: 12/22/18  6:37 AM  Result Value Ref Range   Cholesterol 167 0 - 200 mg/dL   Triglycerides 90 <591 mg/dL   HDL 37 (L) >63 mg/dL   Total CHOL/HDL Ratio 4.5 RATIO   VLDL 18 0 - 40 mg/dL  LDL Cholesterol 112 (H) 0 - 99 mg/dL    Comment:        Total  Cholesterol/HDL:CHD Risk Coronary Heart Disease Risk Table                     Men   Women  1/2 Average Risk   3.4   3.3  Average Risk       5.0   4.4  2 X Average Risk   9.6   7.1  3 X Average Risk  23.4   11.0        Use the calculated Patient Ratio above and the CHD Risk Table to determine the patient's CHD Risk.        ATP III CLASSIFICATION (LDL):  <100     mg/dL   Optimal  222-979  mg/dL   Near or Above                    Optimal  130-159  mg/dL   Borderline  892-119  mg/dL   High  >417     mg/dL   Very High Performed at [facility], [address], [place], [place] [address]   TSH     Status: None   Collection Time: 12/22/18  6:37 AM  Result Value Ref Range   TSH 1.609 0.350 - 4.500 uIU/mL    Comment: Performed by a 3rd Generation assay with a functional sensitivity of <=0.01 uIU/mL. Performed at [facility], [address] [place], [place] [address]     Blood Alcohol level:  Lab Results  Component Value Date   ETH <10 12/20/2018   ETH <10 07/27/2018    Metabolic Disorder Labs: Lab Results  Component Value Date   HGBA1C 5.0 12/22/2018   MPG 96.8 12/22/2018   MPG 99.67 07/29/2018   Lab Results  Component Value Date   PROLACTIN 30.0 (H) 07/29/2018   Lab Results  Component Value Date   CHOL 167 12/22/2018   TRIG 90 12/22/2018   HDL 37 (L) 12/22/2018   CHOLHDL 4.5 12/22/2018   VLDL 18 12/22/2018   LDLCALC 112 (H) 12/22/2018   LDLCALC 150 (H) 09/29/2016    Physical Findings: AIMS: Facial and Oral Movements Muscles of Facial Expression: None, normal Lips and Perioral Area: None, normal Jaw: None, normal Tongue: None, normal,Extremity Movements Upper (arms, wrists, hands, fingers): None, normal Lower (legs, knees, ankles, toes): None, normal, Trunk Movements Neck, shoulders, hips: None, normal, Overall Severity Severity of abnormal movements (highest score from questions above): None, normal Incapacitation  due to abnormal movements: None, normal Patient's awareness of abnormal movements (rate only patient's report): No Awareness, Dental Status Current problems with teeth and/or dentures?: No Does patient usually wear dentures?: No  CIWA:    COWS:     Musculoskeletal: Strength & Muscle Tone: within normal limits Gait & Station: normal Patient leans: N/A  Psychiatric Specialty Exam: Physical Exam  ROS  Blood pressure 132/89, pulse (!) 109, temperature 98.8 F (37.1 C), temperature source Oral, resp. rate 16, height 5\' 4"  (1.626 m), weight 95.3 kg, SpO2 100 %.Body mass index is 36.05 kg/m.  General Appearance: Disheveled  Eye Contact:  Fair  Speech:  Pressured  Volume:  Decreased  Mood:  Dysphoric  Affect:  Congruent  Thought Process:  Disorganized and Irrelevant  Orientation: Person place general situation  Thought Content:  Illogical and Tangential  Suicidal Thoughts:  No  Homicidal Thoughts:  No  Memory:  Immediate;   Poor  Judgement:  Impaired  Insight: Generally intact  Psychomotor Activity:  Decreased  Concentration:  Concentration: Poor  Recall:  Poor  Fund of Knowledge:  Poor  Language:  Fair  Akathisia:  Negative  Handed:  Right  AIMS (if indicated):     Assets:  Physical Health Resilience Social Support  ADL's:  Intact  Cognition:  WNL  Sleep:  Number of Hours: 0     Treatment Plan Summary: Daily contact with patient to assess and evaluate symptoms and progress in treatment, Medication management and Plan Plans are to escalate the Haldol by 5 mg at bedtime, continue reality-based therapy for psychosis, continue cognitive-based therapy when more coherent  Bertine Schlottman, MD 12/22/2018, 9:40 AM

## 2018-12-22 NOTE — Progress Notes (Signed)
Nursing Progress Note: 7p-7a D: Pt currently presents with a anxious/invasive/tangential/disorganized affect and behavior. Interacting minimally with the milieu. Pt reports poor sleep during the previous night with current medication regimen.   A: Pt provided with medications per providers orders. Pt's labs and vitals were monitored throughout the night. Pt supported emotionally and encouraged to express concerns and questions. Pt educated on medications.  R: Pt's safety ensured with 15 minute and environmental checks. Pt currently denies SI, HI, and VH and endorses AH. Pt verbally contracts to seek staff if SI,HI, or AVH occurs and to consult with staff before acting on any harmful thoughts. Will continue to monitor.

## 2018-12-23 MED ORDER — DIPHENHYDRAMINE HCL 50 MG PO CAPS
50.0000 mg | ORAL_CAPSULE | Freq: Three times a day (TID) | ORAL | Status: DC
  Administered 2018-12-23 – 2018-12-24 (×3): 50 mg via ORAL
  Filled 2018-12-23 (×6): qty 1

## 2018-12-23 NOTE — Progress Notes (Signed)
[facility] MD Progress Note  12/23/2018 10:31 AM Lucas Knapp  MRN:  [id number] Subjective:    In summary [name] is 33 years of age and well-known to the service due to chronic paranoid schizophrenia, chronic cannabis dependency, and presentation from [facility] on the date of 12/30, he has yet to recalibrate to his baseline mental status, he continues to somewhat ramble and be intrusive focused on discharge and his Haldol dose has been escalated with some benefit although he is behaviorally more contained still somewhat disorganized in behavior and thought at intervals. He no longer has the delusional belief that he is being poisoned through his legs for example, he has developed some mild blistering type lesions on his upper and lower lip they are again new and mild but they do not look like a drug reaction to me but will try some Benadryl temporarily rather than the Cogentin.  We will simplify his med list. Patient focused on discharge but not quite ready also need to discuss long-acting injectable further  Principal Problem: Exacerbation and underlying psychotic disorder in the context of chronic cannabis dependency and poor compliance Diagnosis: Active Problems:   Paranoid schizophrenia (HCC)  Total Time spent with patient: 20 minutes  Past Medical History:  Past Medical History:  Diagnosis Date  . Back pain   . Bipolar 1 disorder (HCC)   . Hyperlipidemia   . Hypertension    pt has been prescribed HCTZ 12.5 mg daily. Pt was 140/80 on admission.   . Schizophrenia (HCC)    History reviewed. No pertinent surgical history. Family History:  Family History  Problem Relation Age of Onset  . Hypertension Father   . Hyperlipidemia Father   . Hypertension Paternal Grandmother   . Hypertension Paternal Grandfather   . Hypertension Brother        identical twin  . Psychosis Brother        "mental issues"      Social History:  Social History   Substance and Sexual Activity  Alcohol Use  Yes  . Alcohol/week: 0.0 standard drinks   Comment: occasionally --"I need to drink more"     Social History   Substance and Sexual Activity  Drug Use Yes  . Types: Marijuana   Comment: Pt endorsed used of marijuana    Social History   Socioeconomic History  . Marital status: Single    Spouse name: Not on file  . Number of children: Not on file  . Years of education: Not on file  . Highest education level: Not on file  Occupational History  . Not on file  Social Needs  . Financial resource strain: Not on file  . Food insecurity:    Worry: Not on file    Inability: Not on file  . Transportation needs:    Medical: Not on file    Non-medical: Not on file  Tobacco Use  . Smoking status: Current Every Day Smoker    Types: Cigarettes  . Smokeless tobacco: Never Used  . Tobacco comment: 10-15  Substance and Sexual Activity  . Alcohol use: Yes    Alcohol/week: 0.0 standard drinks    Comment: occasionally --"I need to drink more"  . Drug use: Yes    Types: Marijuana    Comment: Pt endorsed used of marijuana  . Sexual activity: Not Currently  Lifestyle  . Physical activity:    Days per week: Not on file    Minutes per session: Not on file  . Stress: Not  on file  Relationships  . Social connections:    Talks on phone: Not on file    Gets together: Not on file    Attends religious service: Not on file    Active member of club or organization: Not on file    Attends meetings of clubs or organizations: Not on file    Relationship status: Not on file  Other Topics Concern  . 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 Also works for the [employer] one day a week.    Single   No children   Completed college (bachelors in Statisticianlectronic technology)   Enjoys swimming, playing pool, walking   [place].  Has identical twin and 2 other brothers in [place]   Additional Social History:                          Sleep: Good  Appetite:  Fair  Current Medications: Current Facility-Administered Medications  Medication Dose Route Frequency Provider Last Rate Last Dose  . acetaminophen (TYLENOL) tablet 650 mg  650 mg Oral Q6H PRN Maryagnes AmosStarkes-Perry, Takia S, FNP      . alum & mag hydroxide-simeth (MAALOX/MYLANTA) 200-200-20 MG/5ML suspension 30 mL  30 mL Oral Q4H PRN Starkes-Perry, Juel Burrowakia S, FNP      . amLODipine (NORVASC) tablet 5 mg  5 mg Oral Daily Maryagnes AmosStarkes-Perry, Takia S, FNP   5 mg at 12/23/18 0817  . diphenhydrAMINE (BENADRYL) capsule 50 mg  50 mg Oral TID Malvin JohnsFarah, Elhadji Pecore, MD      . haloperidol (HALDOL) tablet 10 mg  10 mg Oral Daily Malvin JohnsFarah, Marissia Blackham, MD   10 mg at 12/23/18 0817  . haloperidol (HALDOL) tablet 15 mg  15 mg Oral QHS Malvin JohnsFarah, Lynnae Ludemann, MD   15 mg at 12/22/18 2114  . haloperidol (HALDOL) tablet 5 mg  5 mg Oral Q6H PRN Donell SievertSimon, Spencer E, PA-C   5 mg at 12/22/18 0208  . haloperidol lactate (HALDOL) injection 5 mg  5 mg Intramuscular Q6H PRN Maryagnes AmosStarkes-Perry, Takia S, FNP      . hydrOXYzine (ATARAX/VISTARIL) tablet 25 mg  25 mg Oral TID PRN Maryagnes AmosStarkes-Perry, Takia S, FNP   25 mg at 12/22/18 2113  . loratadine (CLARITIN) tablet 10 mg  10 mg Oral Daily Maryagnes AmosStarkes-Perry, Takia S, FNP   10 mg at 12/23/18 0816  . magnesium hydroxide (MILK OF MAGNESIA) suspension 30 mL  30 mL Oral Daily PRN Maryagnes AmosStarkes-Perry, Takia S, FNP      . ondansetron (ZOFRAN-ODT) disintegrating tablet 4 mg  4 mg Oral Q6H PRN Donell SievertSimon, Spencer E, PA-C   4 mg at 12/21/18 2259  . traZODone (DESYREL) tablet 200 mg  200 mg Oral QHS Malvin JohnsFarah, Alexius Hangartner, MD   200 mg at 12/22/18 2113    Lab Results:  Results for orders placed or performed during the hospital encounter of 12/21/18 (from the past 48 hour(s))  Hemoglobin A1c     Status: None   Collection Time: 12/22/18  6:37 AM  Result Value Ref Range   Hgb A1c MFr Bld 5.0 4.8 - 5.6 %    Comment: (NOTE) Pre diabetes:          5.7%-6.4% Diabetes:              >6.4% Glycemic control for   <7.0% adults with  diabetes    Mean Plasma Glucose 96.8 mg/dL    Comment:  Performed at [facility], [address] [place], [place] [address]  Lipid panel     Status: Abnormal   Collection Time: 12/22/18  6:37 AM  Result Value Ref Range   Cholesterol 167 0 - 200 mg/dL   Triglycerides 90 <604<150 mg/dL   HDL 37 (L) >54>40 mg/dL   Total CHOL/HDL Ratio 4.5 RATIO   VLDL 18 0 - 40 mg/dL   LDL Cholesterol 098112 (H) 0 - 99 mg/dL    Comment:        Total Cholesterol/HDL:CHD Risk Coronary Heart Disease Risk Table                     Men   Women  1/2 Average Risk   3.4   3.3  Average Risk       5.0   4.4  2 X Average Risk   9.6   7.1  3 X Average Risk  23.4   11.0        Use the calculated Patient Ratio above and the CHD Risk Table to determine the patient's CHD Risk.        ATP III CLASSIFICATION (LDL):  <100     mg/dL   Optimal  119-147100-129  mg/dL   Near or Above                    Optimal  130-159  mg/dL   Borderline  [id number]  mg/dL   High  >130>190     mg/dL   Very High Performed at [facility], [address] [place], [place] [address]   TSH     Status: None   Collection Time: 12/22/18  6:37 AM  Result Value Ref Range   TSH 1.609 0.350 - 4.500 uIU/mL    Comment: Performed by a 3rd Generation assay with a functional sensitivity of <=0.01 uIU/mL. Performed at [facility], [address] [place], [place] [address]     Blood Alcohol level:  Lab Results  Component Value Date   ETH <10 12/20/2018   ETH <10 07/27/2018    Metabolic Disorder Labs: Lab Results  Component Value Date   HGBA1C 5.0 12/22/2018   MPG 96.8 12/22/2018   MPG 99.67 07/29/2018   Lab Results  Component Value Date   PROLACTIN 30.0 (H) 07/29/2018   Lab Results  Component Value Date   CHOL 167 12/22/2018   TRIG 90 12/22/2018   HDL 37 (L) 12/22/2018   CHOLHDL 4.5 12/22/2018   VLDL 18 12/22/2018   LDLCALC 112 (H) 12/22/2018   LDLCALC 150 (H) 09/29/2016    Physical  Findings: AIMS: Facial and Oral Movements Muscles of Facial Expression: None, normal Lips and Perioral Area: None, normal Jaw: None, normal Tongue: None, normal,Extremity Movements Upper (arms, wrists, hands, fingers): None, normal Lower (legs, knees, ankles, toes): None, normal, Trunk Movements Neck, shoulders, hips: None, normal, Overall Severity Severity of abnormal movements (highest score from questions above): None, normal Incapacitation due to abnormal movements: None, normal Patient's awareness of abnormal movements (rate only patient's report): No Awareness, Dental Status Current problems with teeth and/or dentures?: No Does patient usually wear dentures?: No  CIWA:    COWS:     Musculoskeletal: Strength & Muscle Tone: within normal limits Gait & Station: normal Patient leans: N/A  Psychiatric Specialty Exam: Physical Exam  ROS  Blood pressure 125/81, pulse (!) 109, temperature 98.4 F (36.9 C), temperature source Oral, resp. rate 18, height  5\' 4"  (1.626 m), weight 95.3 kg, SpO2 100 %.Body mass index is 36.05 kg/m.  General Appearance: Casual and Disheveled  Eye Contact:  Fair  Speech:  Slow and Slurred  Volume:  Decreased  Mood:  Dysphoric  Affect:  Restricted  Thought Process:  Disorganized  Orientation:  Full (Time, Place, and Person)  Thought Content:  Tangential  Suicidal Thoughts:  No  Homicidal Thoughts:  No  Memory:  Immediate;   Fair  Judgement:  Poor  Insight:  Lacking  Psychomotor Activity:  Decreased  Concentration:  Concentration: Fair  Recall:  [employer] of Knowledge:  Fair  Language:  Fair  Akathisia:  Negative  Handed:  Right  AIMS (if indicated):     Assets:  Communication Skills Desire for Improvement  ADL's:  Intact  Cognition:  WNL  Sleep:  Number of Hours: 0     Treatment Plan Summary: Daily contact with patient to assess and evaluate symptoms and progress in treatment, Medication management and Plan For psychosis continue  Haldol and reality based therapy and current precautions for cannabis dependency continue to discuss abstinence with him for safety reasons continue 15-minute precautions  Kenyon Eshleman, MD 12/23/2018, 10:31 AM

## 2018-12-23 NOTE — [facility] Group Notes (Signed)
[facility] Mental Health Association Group Therapy 12/23/2018 2:06 PM  Type of Therapy: Mental Health Association Presentation  Participation Level: Did not attend.   Analisia Kingsford, MSW, LCSWA 12/23/2018 2:04 PM 

## 2018-12-23 NOTE — [facility] Counselor (Signed)
Adult Comprehensive Assessment  Patient ID: Damaris Rehl, male   DOB: 08-29-1986, 33 y.o.   MRN: [id number]  Information Source: Information source: Patient  Current Stressors:  Patient states their primary concerns and needs for treatment are:: "Identify things that make me happy; take care of myself." Patient spoke at length about doing for others and neglecting his own needs and wants. Patient states their goals for this hospitilization and ongoing recovery are:: "Keep up with meds." Says his medication provider at [facility] recently switched medications which he cannot afford and his insurance doesn't cover telemedicine either.  Educational / Learning stressors: None Employment / Job issues: Pt works for [employer] time, also on disability Family Relationships:Terrence reports constant aggravation and fighting with twin brother, with whom he lives Surveyor, quantity / Lack of resources (include bankruptcy):  Housing / Lack of housing:Hopes to get his own place so he and brother don't have to deal with each other; brother just signed a new lease. Hopes to break it and go their separate ways.  Physical health (include injuries &life threatening diseases): None  Social relationships: None  Substance abuse: Marijuana use  Bereavement / Loss: None   Living/Environment/Situation:  Living Arrangements:Shares apartment with twin brother; reports brother just signed a new lease, patient hoping to break lease and get his own place soon. Living conditions (as described by patient or guardian):"He is aggravating me to my breaking point. He tries to control me, acts like my father." How long has patient lived in current situation?:many years What is atmosphere in current home: Chaotic  Family History:  Marital status: Single Does patient have children?: No Sexual Orientation: Straight  Childhood History:  By whom was/is the patient raised?: Both parents Additional childhood history  information: Pt reported that his childhood was "sheltered" Description of patient's relationship with caregiver when they were a child: Pt reported that the relationship was good  Patient's description of current relationship with people who raised him/her: Pt reported that he is still close with his parents  Does patient have siblings?: Yes Number of Siblings: 2  Description of patient's current relationship with siblings:Better with older brother, bad with twin, with whom he lives Did patient suffer any verbal/emotional/physical/sexual abuse as a child?: No Did patient suffer from severe childhood neglect?: No Has patient ever been sexually abused/assaulted/raped as an adolescent or adult?: No Was the patient ever a victim of a crime or a disaster?: No Witnessed domestic violence?: No Has patient been effected by domestic violence as an adult?: No  Education:  Highest grade of school patient has completed: Chief Operating Officer' Degree  Currently a Consulting civil engineer?: No Learning disability?: No  Employment/Work Situation:  Employment situation: Employed Where is patient currently employed?: Works through [employer] long has patient been employed?: Late 2019 Patient's job has been impacted by current illness: No What is the longest time patient has a held a job?: 2 years  Where was the patient employed at that time?: [employer]  Has patient ever been in the [employer]?: No Has patient ever served in Buyer, retail?: No  Financial Resources:  Financial resources: Income from employment;Receives SSDI;Food stamps Does patient have a representative payee or guardian?: No  Alcohol/Substance Abuse:  What has been your use of drugs/alcohol within the last 12 months?: Marijuana  If attempted suicide, did drugs/alcohol play a role in this?: No Alcohol/Substance Abuse Treatment Hx: Past Tx, Outpatient If yes, describe treatment: [facility]  Has  alcohol/substance abuse ever caused legal  problems?: Yes  Social Support System: Patient's Community Support System: Fair Development worker, community Support System: Friends  Type of faith/religion: Pt reported "Comptroller" How does patient's faith help to cope with current illness?: Pt reported that he is researching religion  Leisure/Recreation:  Leisure and Hobbies: Swimming, playing pool, reading  Strengths/Needs:   What is the patient's perception of their strengths?: "I'm a good worker" Patient states they can use these personal strengths during their treatment to contribute to their recovery: Unable to identify Patient states these barriers may affect/interfere with their treatment: None Patient states these barriers may affect their return to the community: Relationship with brother, conflict Other important information patient would like considered in planning for their treatment: None  Discharge Plan:   Currently receiving community mental health services: Yes (From Whom)(Unknown) Patient states they will know when they are safe and ready for discharge when: "I'm ready now. I feel rested and energized." Does patient have access to transportation?: Yes Does patient have financial barriers related to discharge medications?: No Will patient be returning to same living situation after discharge?: Yes   Summary/Recommendations:   Summary and Recommendations (to be completed by the evaluator): Urho is a 33 year old male from [place] [facility] [place]) admitted [facility]untarily to [facility] from [place] for bizarre behavior and delusions that his blood had been poisoned. Patient appears to be responding to internal stimuli on unit, endorses AH. Patient diagnosed with schizophrenia and has been off medications. Patient has prior [facility] admission from August 2019. Patient current with [facility] for outpatient care. Patient would benefit from crisis stabilization, therapeutic milieu, medication  management, and referral services.   Darreld Mclean. 12/23/2018

## 2018-12-23 NOTE — [facility] Suicide Risk Assessment (Signed)
[facility] INPATIENT:  Family/Significant Other Suicide Prevention Education  Suicide Prevention Education:  Contact Attempts: friend, Carlyle Lipa [phone] has been identified by the patient as the family member/significant other with whom the patient will be residing, and identified as the person(s) who will aid the patient in the event of a mental health crisis.  With written consent from the patient, two attempts were made to provide suicide prevention education, prior to and/or following the patient's discharge.  We were unsuccessful in providing suicide prevention education.  A suicide education pamphlet was given to the patient to share with family/significant other.  Date and time of first attempt: 12/23/18 at 11:58am Date and time of second attempt: To be attempted at a later time  Darreld Mclean 12/23/2018, 12:00 PM

## 2018-12-23 NOTE — Progress Notes (Addendum)
Nursing Progress Note: 7p-7a D: Pt currently presents with a anxious/thought blocking/tangential/disorganized affect and behavior. Pt states, "I want to drink the blood and eat the body." Interacting minimally with the milieu. Pt reports poor sleep during the previous night with current medication regimen. Pt attended wrap up group.   A: Pt provided with medications per providers orders. Pt's labs and vitals were monitored throughout the night. Pt supported emotionally and encouraged to express concerns and questions. Pt educated on medications.  R: Pt's safety ensured with 15 minute and environmental checks. Pt currently denies SI, HI, and VH and endorses AH. Pt verbally contracts to seek staff if SI,HI, or AVH occurs and to consult with staff before acting on any harmful thoughts. Will continue to monitor.

## 2018-12-23 NOTE — Progress Notes (Signed)
Adult Psychoeducational Group Note  Date:  12/23/2018 Time:  9:34 PM  Group Topic/Focus:  Wrap-Up Group:   The focus of this group is to help patients review their daily goal of treatment and discuss progress on daily workbooks.  Participation Level:  Active  Participation Quality:  Appropriate  Affect:  Appropriate  Cognitive:  Appropriate  Insight: Appropriate  Engagement in Group:  Engaged  Modes of Intervention:  Discussion  Additional Comments: The patient expressed that he rates today a 10.The patient also said that he attended groups.  Octavio Mannshigpen, Baley Shands Lee 12/23/2018, 9:34 PM

## 2018-12-23 NOTE — Progress Notes (Signed)
D: Patient requested that he receive an ensure d/t polyphagia. He complains of not receiving enough food here. Patient has been eating large meals and snacks as well, so I have no concerns of malnutrition. A: Provided reassurance that I would notify the provider. R: Patient satisfied with waiting to discuss with provider in the morning.

## 2018-12-23 NOTE — Plan of Care (Addendum)
D: Patient presents manic, agitated. He spent most of the morning at the nurse's station, talking with pressured speech and being disruptive to the milieu. He did become agitated with his peers a couple times, but otherwise has been pleasant and cooperative. He complains of feeling like the Haldol causes a "pause in my brain" and "[facility] told me not to take it." He continues to make bizarre statements "I want to drink the blood and eat the body" but did not provide any context. He slept well, and did not request medication to help him sleep. His appetite is good, energy normal and concentration good. He rates his depression, hopelessness and anxiety 0/10. He denies withdrawal symptoms or physical complaints other than sciatic pain 8/10. Patient denies SI/HI/AVH.  A: Patient declines medication for pain. Patient checked q15 min, and checks reviewed. Reviewed medication changes with patient and educated on side effects. Educated patient on importance of attending group therapy sessions and educated on several coping skills. Encouarged participation in milieu through recreation therapy and attending meals with peers. Support and encouragement provided. Fluids offered. R: Patient receptive to education on medications, and is medication compliant. Patient contracts for safety on the unit. Goal: "Focus on things that make me happy" and "close eyes and meditate."

## 2018-12-24 MED ORDER — TRAZODONE HCL 100 MG PO TABS: 200.0000 mg | ORAL_TABLET | Freq: Every day | ORAL | 0 refills | Status: DC

## 2018-12-24 MED ORDER — HYDROXYZINE HCL 25 MG PO TABS: 25.0000 mg | ORAL_TABLET | Freq: Three times a day (TID) | ORAL | 0 refills | Status: DC | PRN

## 2018-12-24 MED ORDER — LOVASTATIN 20 MG PO TABS: 20.0000 mg | ORAL_TABLET | Freq: Every day | ORAL | 0 refills | Status: DC

## 2018-12-24 MED ORDER — LORATADINE 10 MG PO TABS: 10.0000 mg | ORAL_TABLET | Freq: Every day | ORAL | 11 refills | Status: DC

## 2018-12-24 MED ORDER — PRAVASTATIN SODIUM 20 MG PO TABS: 20.0000 mg | ORAL_TABLET | Freq: Every day | ORAL | 0 refills | Status: DC

## 2018-12-24 MED ORDER — AMLODIPINE BESYLATE 5 MG PO TABS: 5.0000 mg | ORAL_TABLET | Freq: Every day | ORAL | 0 refills | Status: DC

## 2018-12-24 MED ORDER — HALOPERIDOL 5 MG PO TABS: 15.0000 mg | ORAL_TABLET | Freq: Every day | ORAL | 0 refills | Status: DC

## 2018-12-24 MED ORDER — PANTOPRAZOLE SODIUM 40 MG PO TBEC: 40.0000 mg | DELAYED_RELEASE_TABLET | Freq: Two times a day (BID) | ORAL | 0 refills | Status: DC

## 2018-12-24 MED ORDER — HALOPERIDOL 10 MG PO TABS: 10.0000 mg | ORAL_TABLET | Freq: Every day | ORAL | 0 refills | Status: DC

## 2018-12-24 NOTE — Discharge Summary (Signed)
Physician Discharge Summary Note  Patient:  Lucas Knapp is an 33 y.o., male  MRN:  [id number]  DOB:  [birth year]  Patient phone:  [phone] (home)   Patient address:   [address] [place] [address],    Total Time spent with patient: Greater than 30 minutes  Date of Admission:  12/21/2018  Date of Discharge: 12-24-2018  Reason for Admission: Patient has been off of his medication for 4 days, reporting hallucinations, hearing specifically bomb blast & was very delusional believing he been poisoned, has been trying to show examiners where he been injected with poison through his legs.   Principal Problem: Paranoid schizophrenia [facility])  Discharge Diagnoses: Patient Active Problem List   Diagnosis Date Noted  . Paranoid schizophrenia (HCC) [F20.0] 12/21/2018    Priority: High  . Schizophrenia, paranoid type (HCC) [F20.0] 09/17/2012    Priority: High    Class: Acute  . Preventative health care [Z00.00] 09/29/2016  . HTN (hypertension) [I10] 02/18/2016  . Hyperlipidemia [E78.5] 02/18/2016  . Fatigue [R53.83] 02/18/2016  . Low back pain [M54.5] 02/18/2016  . Abdominal pain [R10.9] 11/27/2014  . Cannabis abuse [F12.10] 09/17/2012   Past Psychiatric History: Paranoid Schizophrenia.  Past Medical History:  Past Medical History:  Diagnosis Date  . Back pain   . Bipolar 1 disorder (HCC)   . Hyperlipidemia   . Hypertension    pt has been prescribed HCTZ 12.5 mg daily. Pt was 140/80 on admission.   . Schizophrenia (HCC)    History reviewed. No pertinent surgical history.  Family History:  Family History  Problem Relation Age of Onset  . Hypertension Father   . Hyperlipidemia Father   . Hypertension Paternal Grandmother   . Hypertension Paternal Grandfather   . Hypertension Brother        identical twin  . Psychosis Brother        "mental issues"     Family Psychiatric  History: See H&P.  Social History:  Social History   Substance and  Sexual Activity  Alcohol Use Yes  . Alcohol/week: 0.0 standard drinks   Comment: occasionally --"I need to drink more"     Social History   Substance and Sexual Activity  Drug Use Yes  . Types: Marijuana   Comment: Pt endorsed used of marijuana    Social History   Socioeconomic History  . Marital status: Single    Spouse name: Not on file  . Number of children: Not on file  . Years of education: Not on file  . Highest education level: Not on file  Occupational History  . Not on file  Social Needs  . Financial resource strain: Not on file  . Food insecurity:    Worry: Not on file    Inability: Not on file  . Transportation needs:    Medical: Not on file    Non-medical: Not on file  Tobacco Use  . Smoking status: Current Every Day Smoker    Types: Cigarettes  . Smokeless tobacco: Never Used  . Tobacco comment: 10-15  Substance and Sexual Activity  . Alcohol use: Yes    Alcohol/week: 0.0 standard drinks    Comment: occasionally --"I need to drink more"  . Drug use: Yes    Types: Marijuana    Comment: Pt endorsed used of marijuana  . Sexual activity: Not Currently  Lifestyle  . Physical activity:    Days per week: Not on file    Minutes per session: Not  on file  . Stress: Not on file  Relationships  . Social connections:    Talks on phone: Not on file    Gets together: Not on file    Attends religious service: Not on file    Active member of club or organization: Not on file    Attends meetings of clubs or organizations: Not on file    Relationship status: Not on file  Other Topics Concern  . 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 Also works for the [employer] one day a week.    Single   No children   Completed college (bachelors in Statisticianlectronic technology)   Enjoys swimming, playing pool, walking   [place]  Has identical twin and 2 other [place]   Hospital Course: (Per Md's admission  evaluation): Mr. Alessandra BevelsVaughn is well-known to the service he was last admitted in August of this year. He has a history of chronic paranoid schizophrenia, chronic cannabis dependency, and presented through the emergency department [facility]. His chief complaint involving off of his medication for 4 days, and reporting hallucinations hearing specifically bomb blast and was very delusional believing he been poisoned he been trying to show examiners where he been injected with poison through his legs. He has required IM medications in the emergency department to include Benadryl and lorazepam but no antipsychotics yet. He received Haldol Decanoate 100 mg intramuscularly prior to his discharge on 8/16 it is unclear if he has had one recently Current med list is supposed to contain Haldol 5 mg twice a day, hydrochlorothiazide 12.5 mg daily, Vistaril 25 mg 3 times daily as needed and Cogentin 0.5 twice daily, pravastatin 20 mg a day, trazodone 100 mg at bedtime as needed. The patient is disorganized and he is a poor historian at first he tells me he wants to take Seroquel that he tells me he will take Haldol. He is no longer talking about being poisoned but states it is "other people's problems..." and that is why he is here. Because he is disorganized I cannot give much more information than the above this is the note from our assessment team  After the above admission assessment, [name] was recommended for mood stabilization treatments based on his presenting symptoms. Then, the medication regimen for his presenting symptoms discussed & initiated with his consent. He was medicated & discharged on; Haldol 10 mg in the morning for mood control, Haldol 15 mg at bedtime for mood control, Vistaril 25 mg prn for anxiety & Trazodone 200 mg for insomnia. He was enrolled & participated in the group counseling sessions being offered & held on this unit. He learned coping skills that should help  him after discharge to maintain mood stability.   Riyansh's symptoms responded well to his treatment regimen. He presents mentally & medically stable for discharge. He is seen today by his attending psychiatrist for discharge evaluation. He stated that he is pleased that he sought help. Says he is tolerating his medications well. No withdrawal symptoms. No craving for substances. He is no longer feeling depressed. Describes normal energy and ability to think. Able to focus on task. No suicidal thoughts. No homicidal thoughts. No thoughts of violence. Patient reports normal biological functions.    The nursing staff reports that patient has been appropriate on the unit. Patient has been interacting well with peers & staff. No behavioral  issues. Patient has not voiced any suicidal thoughts. Patient has not been observed to be internally stimulated or occupied. Patient has been adherent with his treatment recommendations. Patient has been tolerating his medications well, denies any adverse reactions or side effects.   Today upon evaluation, ptshares, "Hey, I'm doing alright. Pt reports he is doing well overall and he has no specific concerns today. He remains slightly tangential, but he appears to be near his baseline. He denies physical complaints. He denies SI/HI/AH/VH. Pt reports he is tolerating his medications well, and he is in agreement to continue his current regimen without changes. He was able to engage in safety planning including plan to [facility] or contact emergency services if he feels unable to maintain his own safety or the safety of others. Pt had no further questions, comments, or concerns.[facility] with all personal belongings in no apparent distress.  Physical Findings: AIMS: Facial and Oral Movements Muscles of Facial Expression: None, normal Lips and Perioral Area: None, normal Jaw: None, normal Tongue: None, normal,Extremity Movements Upper (arms, wrists, hands, fingers):  None, normal Lower (legs, knees, ankles, toes): None, normal, Trunk Movements Neck, shoulders, hips: None, normal, Overall Severity Severity of abnormal movements (highest score from questions above): None, normal Incapacitation due to abnormal movements: None, normal Patient's awareness of abnormal movements (rate only patient's report): No Awareness, Dental Status Current problems with teeth and/or dentures?: No Does patient usually wear dentures?: No  CIWA:  CIWA-Ar Total: 0 COWS:  COWS Total Score: 4  Musculoskeletal: Strength & Muscle Tone: within normal limits Gait & Station: normal Patient leans: N/A  Psychiatric Specialty Exam: Physical Exam  Nursing note and vitals reviewed. Constitutional: He appears well-developed.  HENT:  Head: Normocephalic.  Eyes: Pupils are equal, round, and reactive to light.  Neck: Normal range of motion.  Cardiovascular:  Hx. HTN  Respiratory: Effort normal.  GI: Soft.  Genitourinary:    Genitourinary Comments: Deferred   Musculoskeletal: Normal range of motion.  Neurological: He is alert.  Skin: Skin is warm.    Review of Systems  Constitutional: Negative.   HENT: Negative.   Eyes: Negative.   Respiratory: Negative.  Negative for cough and shortness of breath.   Cardiovascular: Negative.  Negative for chest pain and palpitations.  Gastrointestinal: Negative.  Negative for abdominal pain, heartburn, nausea and vomiting.  Genitourinary: Negative.   Musculoskeletal: Negative.   Skin: Negative.   Neurological: Negative.  Negative for dizziness and headaches.  Endo/Heme/Allergies: Negative.   Psychiatric/Behavioral: Positive for depression (Stable), hallucinations (Hx. psychosis (Stable)) and substance abuse (Hx. THC use ). Negative for memory loss and suicidal ideas. The patient has insomnia (Stable). The patient is not nervous/anxious (Stable).     Blood pressure (!) 138/92, pulse 97, temperature 98.2 F (36.8 C), temperature source  Oral, resp. rate 18, height 5\' 4"  (1.626 m), weight 95.3 kg, SpO2 100 %.Body mass index is 36.05 kg/m.  See Md's discharge SRA   Has this patient used any form of tobacco in the last 30 days? (Cigarettes, Smokeless Tobacco, Cigars, and/or Pipes): Yes, an FDA-approved tobacco cessation medication was offered at discharge.  Blood Alcohol level:  Lab Results  Component Value Date   [facility] 12/20/2018   ETH <10 07/27/2018   Metabolic Disorder Labs: Lab Results  Component Value Date   HGBA1C 5.0 12/22/2018   MPG 96.8 12/22/2018   MPG 99.67 07/29/2018   Lab Results  Component Value Date   PROLACTIN 30.0 (H) 07/29/2018  Lab Results  Component Value Date   CHOL 167 12/22/2018   TRIG 90 12/22/2018   HDL 37 (L) 12/22/2018   CHOLHDL 4.5 12/22/2018   VLDL 18 12/22/2018   LDLCALC 112 (H) 12/22/2018   LDLCALC 150 (H) 09/29/2016   See Psychiatric Specialty Exam and Suicide Risk Assessment completed by Attending Physician prior to discharge.  Discharge destination:  Home  Is patient on multiple antipsychotic therapies at discharge:  No   Has Patient had three or more failed trials of antipsychotic monotherapy by history:  No  Recommended Plan for Multiple Antipsychotic Therapies: NA  Allergies as of 12/24/2018      Reactions   Geodon [ziprasidone Hcl] Other (See Comments)   Reaction:  Made pts throat dry       Medication List    STOP taking these medications   lovastatin 20 MG tablet Commonly known as:  MEVACOR   meloxicam 7.5 MG tablet Commonly known as:  MOBIC   naproxen sodium 220 MG tablet Commonly known as:  ALEVE   pantoprazole 40 MG tablet Commonly known as:  PROTONIX   pravastatin 20 MG tablet Commonly known as:  PRAVACHOL     TAKE these medications     Indication  amLODipine 5 MG tablet Commonly known as:  NORVASC Take 1 tablet (5 mg total) by mouth daily. For high blood pressure What changed:  additional instructions  Indication:  High Blood Pressure  Disorder   haloperidol 5 MG tablet Commonly known as:  HALDOL Take 3 tablets (15 mg total) by mouth at bedtime. For mood control What changed:  You were already taking a medication with the same name, and this prescription was added. Make sure you understand how and when to take each.  Indication:  Mood control   haloperidol 10 MG tablet Commonly known as:  HALDOL Take 1 tablet (10 mg total) by mouth daily. (in the morning): For mood control Start taking on:  December 25, 2018 What changed:    medication strength  how much to take  when to take this  additional instructions  Indication:  Mood control   hydrOXYzine 25 MG tablet Commonly known as:  ATARAX/VISTARIL Take 1 tablet (25 mg total) by mouth 3 (three) times daily as needed for anxiety.  Indication:  Feeling Anxious   loratadine 10 MG tablet Commonly known as:  CLARITIN Take 1 tablet (10 mg total) by mouth daily. (May buy from over the counter): For allergies What changed:  additional instructions  Indication:  Perennial Allergic Rhinitis, Hayfever   traZODone 100 MG tablet Commonly known as:  DESYREL Take 2 tablets (200 mg total) by mouth at bedtime. For sleep What changed:    how much to take  when to take this  reasons to take this  additional instructions  Indication:  Trouble Sleeping      Follow-up Information    [name]. Go on 12/27/2018.   Why:  Please attend your hospital discharge appointment with Fred on 12/27/18 at 10:45am Contact information: [address] [place] [address] [phone]          Follow-up recommendations: Activity:  As tolerated Diet: As recommended by your primary care doctor. Keep all scheduled follow-up appointments as recommended.    Comments: Patient is instructed prior to discharge to: Take all medications as prescribed by his/her mental healthcare provider. Report any adverse effects and or reactions from the medicines to his/her outpatient provider  promptly. Patient has been instructed & cautioned: To not  engage in alcohol and or illegal drug use while on prescription medicines. In the event of worsening symptoms, patient is instructed to call the crisis hotline, 911 and or go to the nearest ED for appropriate evaluation and treatment of symptoms. To follow-up with his/her primary care provider for your other medical issues, concerns and or health care needs.   Signed: Armandina Stammer, NP, PMHNP, FNP-BC 12/24/2018, 1:48 PM

## 2018-12-24 NOTE — [facility] Suicide Risk Assessment (Signed)
[facility] INPATIENT:  Family/Significant Other Suicide Prevention Education  Suicide Prevention Education:  Contact Attempts: friend, Carlyle Lipa [phone] has been identified by the patient as the family member/significant other with whom the patient will be residing, and identified as the person(s) who will aid the patient in the event of a mental health crisis.  With written consent from the patient, two attempts were made to provide suicide prevention education, prior to and/or following the patient's discharge.  We were unsuccessful in providing suicide prevention education.  A suicide education pamphlet was given to the patient to share with family/significant other.  Date and time of first attempt: 12/23/18 at 11:58am Date and time of second attempt: 12/24/2018 at 9:38am  Lucas Knapp 12/23/2018, 12:00 PM

## 2018-12-24 NOTE — [facility] Suicide Risk Assessment (Signed)
[facility] Discharge Suicide Risk Assessment   Principal Problem: Exacerbation and underlying psychotic disorder Discharge Diagnoses: Active Problems:   Paranoid schizophrenia (HCC)   Total Time spent with patient: 45 minutes Current mental status exam-patient is alert and oriented to person place time day and situation is overall cooperative with interview process denies auditory visual hallucinations, denies hearing and seeing things, denies thoughts of harming self or others, no involuntary movements.  Discussed with team thought to be at or very near baseline  Mental Status Per Nursing Assessment::   On Admission:  NA  Demographic Factors:  Male  Loss Factors: Decrease in vocational status  Historical Factors: NA  Risk Reduction Factors:   Religious beliefs about death  Continued Clinical Symptoms:  Schizophrenia:   Less than 81 years old  Cognitive Features That Contribute To Risk:  None    Suicide Risk:  Minimal: No identifiable suicidal ideation.  Patients presenting with no risk factors but with morbid ruminations; may be classified as minimal risk based on the severity of the depressive symptoms  Follow-up Information    [name] on 12/27/2018.   Why:  Please attend your hospital discharge appointment with Fred on 12/27/18 at 10:45am Contact information: [address] [place] [address] [phone]           Plan Of Care/Follow-up recommendations:  Activity:  full  Langston Summerfield, MD 12/24/2018, 8:53 AM

## 2018-12-24 NOTE — Plan of Care (Signed)
Discharge note  Patient verbalizes readiness for discharge. Follow up plan explained, AVS, Transition record and SRA given. Prescriptions and teaching provided. Belongings returned and signed for. Suicide safety plan completed and signed. Patient verbalizes understanding. Patient denies SI/HI and assures this Clinical research associatewriter he will seek assistance should that change. Patient discharged to lobby where ride was waiting.  Problem: Education: Goal: Knowledge of [place] [facility] Education information/materials will improve Outcome: Adequate for Discharge Goal: Emotional status will improve Outcome: Adequate for Discharge Goal: Mental status will improve Outcome: Adequate for Discharge Goal: Verbalization of understanding the information provided will improve Outcome: Adequate for Discharge   Problem: Activity: Goal: Interest or engagement in activities will improve Outcome: Adequate for Discharge Goal: Sleeping patterns will improve Outcome: Adequate for Discharge   Problem: Coping: Goal: Ability to verbalize frustrations and anger appropriately will improve Outcome: Adequate for Discharge Goal: Ability to demonstrate self-control will improve Outcome: Adequate for Discharge   Problem: Health Behavior/Discharge Planning: Goal: Identification of resources available to assist in meeting health care needs will improve Outcome: Adequate for Discharge Goal: Compliance with treatment plan for underlying cause of condition will improve Outcome: Adequate for Discharge   Problem: Physical Regulation: Goal: Ability to maintain clinical measurements within normal limits will improve Outcome: Adequate for Discharge   Problem: Safety: Goal: Periods of time without injury will increase Outcome: Adequate for Discharge   Problem: Activity: Goal: Will identify at least one activity in which they can participate Outcome: Adequate for Discharge   Problem: Coping: Goal: Ability to identify and develop  effective coping behavior will improve Outcome: Adequate for Discharge Goal: Ability to interact with others will improve Outcome: Adequate for Discharge Goal: Demonstration of participation in decision-making regarding own care will improve Outcome: Adequate for Discharge Goal: Ability to use eye contact when communicating with others will improve Outcome: Adequate for Discharge   Problem: Health Behavior/Discharge Planning: Goal: Identification of resources available to assist in meeting health care needs will improve Outcome: Adequate for Discharge   Problem: Self-Concept: Goal: Will verbalize positive feelings about self Outcome: Adequate for Discharge   Problem: Activity: Goal: Will verbalize the importance of balancing activity with adequate rest periods Outcome: Adequate for Discharge   Problem: Education: Goal: Will be free of psychotic symptoms Outcome: Adequate for Discharge Goal: Knowledge of the prescribed therapeutic regimen will improve Outcome: Adequate for Discharge   Problem: Coping: Goal: Coping ability will improve Outcome: Adequate for Discharge Goal: Will verbalize feelings Outcome: Adequate for Discharge   Problem: Health Behavior/Discharge Planning: Goal: Compliance with prescribed medication regimen will improve Outcome: Adequate for Discharge   Problem: Nutritional: Goal: Ability to achieve adequate nutritional intake will improve Outcome: Adequate for Discharge   Problem: Role Relationship: Goal: Ability to communicate needs accurately will improve Outcome: Adequate for Discharge Goal: Ability to interact with others will improve Outcome: Adequate for Discharge   Problem: Safety: Goal: Ability to redirect hostility and anger into socially appropriate behaviors will improve Outcome: Adequate for Discharge Goal: Ability to remain free from injury will improve Outcome: Adequate for Discharge   Problem: Self-Care: Goal: Ability to  participate in self-care as condition permits will improve Outcome: Adequate for Discharge   Problem: Self-Concept: Goal: Will verbalize positive feelings about self Outcome: Adequate for Discharge   Problem: Education: Goal: Ability to state activities that reduce stress will improve Outcome: Adequate for Discharge   Problem: Coping: Goal: Ability to identify and develop effective coping behavior will improve Outcome: Adequate for Discharge   Problem:  Self-Concept: Goal: Ability to identify factors that promote anxiety will improve Outcome: Adequate for Discharge Goal: Level of anxiety will decrease Outcome: Adequate for Discharge Goal: Ability to modify response to factors that promote anxiety will improve Outcome: Adequate for Discharge

## 2018-12-24 NOTE — Progress Notes (Signed)
  [facility] Adult Case Management Discharge Plan :  Will you be returning to the same living situation after discharge:  Yes,  home with brother At discharge, do you have transportation home?: Yes,  friend picking up Do you have the ability to pay for your medications: Yes,  [employer] Medicare  Release of information consent forms completed and in the chart;  Work letters on patient's chart.  Patient to Follow up at: Follow-up Information    [name] on 12/27/2018.   Why:  Please attend your hospital discharge appointment with Fred on 12/27/18 at 10:45am Contact information: [address] [place] [address] [phone]           Next level of care provider has access to [facility] Link:no  Safety Planning and Suicide Prevention discussed: Yes,  with patient.    Has patient been referred to the Quitline?: Patient refused referral  Patient has been referred for addiction treatment: Yes  Darreld Mclean, LCSWA 12/24/2018, 9:22 AM

## 2018-12-29 ENCOUNTER — Emergency Department (HOSPITAL_BASED_OUTPATIENT_CLINIC_OR_DEPARTMENT_OTHER)
Admission: EM | Admit: 2018-12-29 | Discharge: 2018-12-29 | Discharge status: Against Medical Advice | Payer: Medicare HMO

## 2018-12-29 ENCOUNTER — Ambulatory Visit: Payer: Self-pay | Admitting: Family

## 2018-12-29 NOTE — ED Notes (Signed)
Pt called to take back to room for triage, pt states "I don't want to go to a room, I want to have an MRI of my back. That's what I am here for." explained to pt that he must see edp in order to have any imaging studies, as they require a physician's order. Pt states "I"ll call my back doctor and have him order it." pt strongly encouraged to await md eval, pt refuses, amb out of ER waiting are with quick steady gait in nad, talking with companion.

## 2018-12-31 ENCOUNTER — Ambulatory Visit (INDEPENDENT_AMBULATORY_CARE_PROVIDER_SITE_OTHER): Payer: Medicare HMO | Admitting: Family

## 2018-12-31 ENCOUNTER — Encounter: Payer: Self-pay | Admitting: Family

## 2018-12-31 VITALS — BP 151/84 | HR 101 | Temp 99.1°F | Resp 16 | Ht 65.0 in | Wt 230.0 lb

## 2018-12-31 DIAGNOSIS — I10 Essential (primary) hypertension: Secondary | ICD-10-CM | POA: Diagnosis not present

## 2018-12-31 DIAGNOSIS — M5416 Radiculopathy, lumbar region: Secondary | ICD-10-CM

## 2018-12-31 DIAGNOSIS — F209 Schizophrenia, unspecified: Secondary | ICD-10-CM

## 2018-12-31 MED ORDER — AMLODIPINE BESYLATE 10 MG PO TABS: 10.0000 mg | ORAL_TABLET | Freq: Every day | ORAL | 3 refills | Status: DC

## 2018-12-31 NOTE — Patient Instructions (Signed)
Please increase amlodipine from 5mg  to 10mg .   Keep your upcoming appointment with physical therapy. We will work on getting you set up for an MRI to evaluate your spine.

## 2018-12-31 NOTE — Progress Notes (Signed)
Subjective:    Patient ID: Lucas Knapp, male    DOB: 06/20/1986, 33 y.o.   MRN: [id number]  HPI  Patient is a 33 yr old male who presents today for follow up.   Back pain- reports that his back pain is present "all day long." Not worsened by movement. He will see   HTN- He continues amlodipine once daily.  BP Readings from Last 3 Encounters:  12/31/18 (!) 151/84  12/21/18 (!) 138/105  11/05/18 118/83   Schizophrenia-  He is followed by Estelle JuneFred Amtrose at [facility].    Review of Systems See HPI  Past Medical History:  Diagnosis Date  . Back pain   . Bipolar 1 disorder (HCC)   . Hyperlipidemia   . Hypertension    pt has been prescribed HCTZ 12.5 mg daily. Pt was 140/80 on admission.   . Schizophrenia [facility](HCC)      Social History   Socioeconomic History  . Marital status: Single    Spouse name: Not on file  . Number of children: Not on file  . Years of education: Not on file  . Highest education level: Not on file  Occupational History  . Not on file  Social Needs  . Financial resource strain: Not on file  . Food insecurity:    Worry: Not on file    Inability: Not on file  . Transportation needs:    Medical: Not on file    Non-medical: Not on file  Tobacco Use  . Smoking status: Current Every Day Smoker    Types: Cigarettes  . Smokeless tobacco: Never Used  . Tobacco comment: 10-15  Substance and Sexual Activity  . Alcohol use: Yes    Alcohol/week: 0.0 standard drinks    Comment: occasionally --"I need to drink more"  . Drug use: Yes    Types: Marijuana    Comment: Pt endorsed used of marijuana  . Sexual activity: Not Currently  Lifestyle  . Physical activity:    Days per week: Not on file    Minutes per session: Not on file  . Stress: Not on file  Relationships  . Social connections:    Talks on phone: Not on file    Gets together: Not on file    Attends religious service: Not on file    Active member of club or organization: Not on file   Attends meetings of clubs or organizations: Not on file    Relationship status: Not on file  . Intimate partner violence:    Fear of current or ex partner: Not on file    Emotionally abused: Not on file    Physically abused: Not on file    Forced sexual activity: Not on file  Other Topics Concern  . 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 Also works for the [employer] one day a week.    Single   No children   Completed college (bachelors in Statisticianlectronic technology)   Enjoys swimming, playing pool, walking   [place].  Has identical twin and 2 other brothers in [place]    No past surgical history on file.  Family History  Problem Relation Age of Onset  . Hypertension Father   . Hyperlipidemia Father   . Hypertension Paternal Grandmother   . Hypertension Paternal Grandfather   . Hypertension Brother        identical twin  . Psychosis Brother        "  mental issues"      Allergies  Allergen Reactions  . Geodon [Ziprasidone Hcl] Other (See Comments)    Reaction:  Made pts throat dry     Current Outpatient Medications on File Prior to Visit  Medication Sig Dispense Refill  . haloperidol (HALDOL) 10 MG tablet Take 1 tablet (10 mg total) by mouth daily. (in the morning): For mood control 30 tablet 0  . haloperidol (HALDOL) 5 MG tablet Take 3 tablets (15 mg total) by mouth at bedtime. For mood control 90 tablet 0  . hydrOXYzine (ATARAX/VISTARIL) 25 MG tablet Take 1 tablet (25 mg total) by mouth 3 (three) times daily as needed for anxiety. 60 tablet 0  . loratadine (CLARITIN) 10 MG tablet Take 1 tablet (10 mg total) by mouth daily. (May buy from over the counter): For allergies 30 tablet 11  . traZODone (DESYREL) 100 MG tablet Take 2 tablets (200 mg total) by mouth at bedtime. For sleep 60 tablet 0   No current facility-administered medications on file prior to visit.     BP (!) 151/84 (BP Location: Right Arm, Patient  Position: Sitting, Cuff Size: Large)   Pulse (!) 101   Temp 99.1 F (37.3 C) (Oral)   Resp 16   Ht 5\' 5"  (1.651 m)   Wt 230 lb (104.3 kg)   SpO2 99%   BMI 38.27 kg/m       Objective:   Physical Exam Constitutional:      General: He is not in acute distress.    Appearance: He is well-developed.  HENT:     Head: Normocephalic and atraumatic.  Cardiovascular:     Rate and Rhythm: Normal rate and regular rhythm.     Heart sounds: No murmur.  Pulmonary:     Effort: Pulmonary effort is normal. No respiratory distress.     Breath sounds: Normal breath sounds. No wheezing or rales.  Musculoskeletal:     Lumbar back: He exhibits no tenderness.  Skin:    General: Skin is warm and dry.  Neurological:     Mental Status: He is alert and oriented to person, place, and time.     Deep Tendon Reflexes:     Reflex Scores:      Patellar reflexes are 3+ on the right side and 2+ on the left side. Psychiatric:        Behavior: Behavior normal.        Thought Content: Thought content normal.           Assessment & Plan:  HTN- uncontrolled. Increase amlodipine from 5mg  to 10mg .  Schizophrenia- appears stable today. He is concerned that haldol contributes to his back pain and I told him that I do not believe that is the case and he should not discontinue haldol due to back pain.  Lumbar radiculopathy- he will follow back up with PT. Also will refer for MRI due to failure to improve with conservative measures.

## 2019-01-06 DIAGNOSIS — F250 Schizoaffective disorder, bipolar type: Secondary | ICD-10-CM | POA: Diagnosis not present

## 2019-01-10 ENCOUNTER — Ambulatory Visit: Payer: Medicare HMO | Attending: Family | Admitting: Physical Therapy

## 2019-01-10 ENCOUNTER — Encounter

## 2019-01-10 ENCOUNTER — Encounter: Payer: Self-pay | Admitting: Physical Therapy

## 2019-01-10 DIAGNOSIS — M5416 Radiculopathy, lumbar region: Secondary | ICD-10-CM | POA: Diagnosis not present

## 2019-01-10 NOTE — Therapy (Signed)
[place], [place], [address] Phone: [phone]   Fax:  [phone]  Physical Therapy Treatment/Renewal   Patient Details  Name: Lucas Knapp MRN: [id number] Date of Birth: Oct 03, 1986 Referring Provider (PT): Debbrah Alar , NP    Encounter Date: 01/10/2019  PT End of Session - 01/10/19 0940    Visit Number  12    Number of Visits  20    Date for PT Re-Evaluation  02/11/19    PT Start Time  0930    PT Stop Time  1015    PT Time Calculation (min)  45 min    Activity Tolerance  Patient tolerated treatment well    Behavior During Therapy  [facility] for tasks assessed/performed       Past Medical History:  Diagnosis Date  . Back pain   . Bipolar 1 disorder ([place])   . Hyperlipidemia   . Hypertension    pt has been prescribed HCTZ 12.5 mg daily. Pt was 140/80 on admission.   . Schizophrenia ([place])     History reviewed. No pertinent surgical history.  There were no vitals filed for this visit.  Subjective Assessment - 01/10/19 0939    Subjective  Patient returns for PT.  Pain 6/10 this AM, shoots down LLE.  Numb and tingling after sitting in waiting room.  He stopped coming due to other medical issues/mental health. Was admitted for several days/  Medication issues.     Currently in Pain?  Yes    Pain Score  6     Pain Location  Back    Pain Orientation  Lower;Left    Pain Descriptors / Indicators  Tightness;Tingling;Aching    Pain Type  Chronic pain    Pain Radiating Towards  L hip, leg     Pain Onset  More than a month ago    Pain Frequency  Intermittent    Aggravating Factors   moving the wrong way    Pain Relieving Factors  stretching , heat         [facility] PT Assessment - 01/10/19 0001      Assessment   Medical Diagnosis  Lumbar radiculopathy L     Referring Provider (PT)  Debbrah Alar , NP     Onset Date/Surgical Date  --   chronic    Next MD Visit  unknown    Prior Therapy   had some 2005       Precautions   Precautions  None      Restrictions   Weight Bearing Restrictions  No      Balance Screen   Has the patient fallen in the past 6 months  No      [place] residence    Living Arrangements  Other relatives    Type of Home  Apartment      Prior Function   Level of Independence  Independent    Vocation  Part time employment    Vocation Requirements  ;laundry room     Leisure  plays pool, ;likes to swim       Cognition   Overall Cognitive Status  Within Functional Limits for tasks assessed      Sensation   Light Touch  Appears Intact      Coordination   Gross Motor Movements are Fluid and Coordinated  Not tested      AROM   Lumbar Flexion  [facility]  Lumbar Extension  WFL    Lumbar - Right Side Bend  WFL    Lumbar - Left Side Bend  WFL    Lumbar - Right Rotation  [facility]    Lumbar - Left Rotation  [facility]       Strength   Right Hip Extension  4+/5    Right Hip ABduction  4+/5    Left Hip Extension  4+/5    Left Hip ABduction  4+/5      Flexibility   Hamstrings  tight R> L , knees hyperext       Palpation   Palpation comment  TTP L lumbar paraspinals         [facility] Adult PT Treatment/Exercise - 01/10/19 0001      Lumbar Exercises: Stretches   Active Hamstring Stretch  Right;Left;5 reps    Active Hamstring Stretch Limitations  towel at knee     Single Knee to Chest Stretch  Right;Left;3 reps    Single Knee to Chest Stretch Limitations  opp knee ext     Lower Trunk Rotation  10 seconds    Lower Trunk Rotation Limitations  x 10     Pelvic Tilt  10 reps      Lumbar Exercises: Prone   Other Prone Lumbar Exercises  POE 5 min     Other Prone Lumbar Exercises  childs pose FW and lateral , tight on L side       Traction   Type of Traction  Lumbar    Min (lbs)  60    Max (lbs)  80    Hold Time  60    Rest Time  15    Time  15             PT Education - 01/10/19 1044    Education Details   traction/DN POC.     Person(s) Educated  Patient    Methods  Explanation    Comprehension  Verbalized understanding          PT Long Term Goals - 01/10/19 0941      PT LONG TERM GOAL #1   Title  Patient will be I with HEP    Baseline  has gotten off track but does intermittently.     Status  Partially Met      PT LONG TERM GOAL #2   Title  Pt will be able to work , including lifting without pain exacerbation.     Status  On-going      PT LONG TERM GOAL #3   Title  Pt will be able to sit comfortably for meals, driving for up to 30 min     Baseline  varies     Status  Partially Met      PT LONG TERM GOAL #4   Title  Pt will be able to demo proper lifting techniques to avoid increased pain     Baseline  will reinforce     Status  Achieved      PT LONG TERM GOAL #5   Title  Pt will no longer have difficulty sleeping due to pain.    Baseline  varies     Status  Partially Met      PT LONG TERM GOAL #6   Title  Pt will have no more LE pain (radicular symptoms) in LLE     Baseline  improved, now returns depending on days' activities     Status  Partially Met  Plan - 01/10/19 0941    Clinical Impression Statement  Patient was renewed for another month of PT.  He was admitted to [facility] due to not taking medication as he should. This has gotten him off track from doing his exercises, pain is overall better but notices increased pain with work activities.  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 He asked about dry needling ad recalls a good outcome when he had it last time at another clinic. Also did traction today as he was having LLE symptoms due to arriving very early and had to sit/ wait.  No pain as he left.       Rehab Potential  Good    PT Frequency  2x / week    PT Duration  4 weeks    PT Treatment/Interventions  ADLs/Self Care Home Management;Electrical Stimulation;Functional mobility training;Neuromuscular  re-education;Cryotherapy;Ultrasound;Traction;Moist Heat;Therapeutic exercise;Therapeutic activities;Patient/family education;Manual techniques;Passive range of motion;Dry needling    PT Next Visit Plan  Simulate work, try squats , hip hinge  , Dry needle , repeat traction?     PT Home Exercise Plan  post pelvic tilt, hip flexor stretch, hamstring stretch and knee to chest All QL stretches,  calf stretch, supine blue band pull down for core Transversus abdominus,  hooklying ball squeeze.     Consulted and Agree with Plan of Care  Patient       Patient will benefit from skilled therapeutic intervention in order to improve the following deficits and impairments:  Pain, Impaired flexibility, Increased fascial restricitons, Decreased strength, Decreased range of motion, Decreased mobility, Hypomobility, Obesity, Decreased endurance, Difficulty walking  Visit Diagnosis: Radiculopathy, lumbar region     Problem List Patient Active Problem List   Diagnosis Date Noted  . Paranoid schizophrenia (HCC) 12/21/2018  . Preventative health care 09/29/2016  . HTN (hypertension) 02/18/2016  . Hyperlipidemia 02/18/2016  . Fatigue 02/18/2016  . Low back pain 02/18/2016  . Abdominal pain 11/27/2014  . Schizophrenia, paranoid type (HCC) 09/17/2012    Class: Acute  . Cannabis abuse 09/17/2012    , 01/10/2019, 10:51 AM  [facility] [address] [place], [place], [address] Phone: [phone]   Fax:  [phone]  Name: Lucas Knapp MRN: [id number] Date of Birth: 11/22/1986   , PT 01/10/19 10:52 AM Phone: [phone] Fax: [phone]  

## 2019-01-12 ENCOUNTER — Telehealth: Payer: Self-pay | Admitting: Family

## 2019-01-12 NOTE — Telephone Encounter (Signed)
-----   Message from Eugene Garnet sent at 01/12/2019 11:16 AM EST ----- Regarding: mri lumbar We have tried three times since 01/04/19 to schedule Lucas Knapp for his MRI.  The VM is not available and we left message on home # also.  Thanks, Eber Jones

## 2019-01-18 ENCOUNTER — Encounter: Payer: Self-pay | Admitting: Physical Therapy

## 2019-01-18 ENCOUNTER — Ambulatory Visit: Payer: Medicare HMO | Admitting: Physical Therapy

## 2019-01-18 DIAGNOSIS — M5416 Radiculopathy, lumbar region: Secondary | ICD-10-CM

## 2019-01-19 ENCOUNTER — Ambulatory Visit: Payer: Medicare HMO | Admitting: Physical Therapy

## 2019-01-19 ENCOUNTER — Encounter: Payer: Self-pay | Admitting: Physical Therapy

## 2019-01-19 DIAGNOSIS — M5416 Radiculopathy, lumbar region: Secondary | ICD-10-CM

## 2019-01-19 NOTE — Therapy (Addendum)
[place], [place], [address] Phone: [phone]   Fax:  [phone]  Physical Therapy Treatment  Patient Details  Name: Lucas Knapp MRN: [id number] Date of Birth: 1986-03-31 Referring Provider (PT): Debbrah Alar , NP    Encounter Date: 01/19/2019  PT End of Session - 01/19/19 3893    Visit Number  14   Number of Visits  20    Date for PT Re-Evaluation  02/11/19    PT Start Time  0835    PT Stop Time  0930    PT Time Calculation (min)  55 min    Activity Tolerance  Patient tolerated treatment well    Behavior During Therapy  [facility] for tasks assessed/performed       Past Medical History:  Diagnosis Date  . Back pain   . Bipolar 1 disorder ([place])   . Hyperlipidemia   . Hypertension    pt has been prescribed HCTZ 12.5 mg daily. Pt was 140/80 on admission.   . Schizophrenia ([place])     History reviewed. No pertinent surgical history.  There were no vitals filed for this visit.  Subjective Assessment - 01/19/19 0842    Subjective  FN helped a lot.  Sore today.  I slept good.  No leg pain today.    Currently in Pain?  Yes    Pain Score  4     Pain Location  Back    Pain Orientation  Left;Lower    Pain Descriptors / Indicators  Sore                       [facility] Adult PT Treatment/Exercise - 01/19/19 0001      Lumbar Exercises: Stretches   Lower Trunk Rotation  10 seconds    Lower Trunk Rotation Limitations  X 10  this feels good.      Piriformis Stretch  3 reps;Left;Right;10 seconds    Piriformis Stretch Limitations  left side a little tighter.     Other Lumbar Stretch Exercise  child's pose X 1 , LT Quadratus lumborum  X 2 hands to right 20 seconds      Lumbar Exercises: Aerobic   Nustep  5 minutes L5 UE/LE      Lumbar Exercises: Standing   Lifting Weights (lbs)  8    Lifting Limitations  dead lift X 5 after instruction,  squats 5 X 2 sets 1 set with pole able to do  without pain increase.        Lumbar Exercises: Supine   Clam Limitations  2x10 red     Heel Slides Limitations  small steps away and back keeping abdominals tightened.     Dead Bug  5 reps    Dead Bug Limitations  keeping knees bent.     Other Supine Lumbar Exercises  hooklying hand press for abdominal work,   Also hands laced with arm reach overhead keeping back flat,        Moist Heat Therapy   Number Minutes Moist Heat  15 Minutes    Moist Heat Location  Lumbar Spine             PT Education - 01/19/19 0921    Education Details  exercise form    Person(s) Educated  Patient    Methods  Explanation;Demonstration;Verbal cues    Comprehension  Verbalized understanding;Returned demonstration          PT Long Term Goals -  01/10/19 0941      PT LONG TERM GOAL #1   Title  Patient will be I with HEP    Baseline  has gotten off track but does intermittently.     Status  Partially Met      PT LONG TERM GOAL #2   Title  Pt will be able to work , including lifting without pain exacerbation.     Status  On-going      PT LONG TERM GOAL #3   Title  Pt will be able to sit comfortably for meals, driving for up to 30 min     Baseline  varies     Status  Partially Met      PT LONG TERM GOAL #4   Title  Pt will be able to demo proper lifting techniques to avoid increased pain     Baseline  will reinforce     Status  Achieved      PT LONG TERM GOAL #5   Title  Pt will no longer have difficulty sleeping due to pain.    Baseline  varies     Status  Partially Met      PT LONG TERM GOAL #6   Title  Pt will have no more LE pain (radicular symptoms) in LLE     Baseline  improved, now returns depending on days' activities     Status  Partially Met            Plan - 01/19/19 1610    Clinical Impression Statement  pain decreased  from 4 to 3/10 post session.  He had no pain  with exercises in supine hooklying position.  He plans to use the tennisball later today for soft  tissue work.  Patient has leg pain about 1 x a week.      PT Next Visit Plan  Simulate work, continue squats , hip hinge  , Dry needle as needed ,      PT Home Exercise Plan  post pelvic tilt, hip flexor stretch, hamstring stretch and knee to chest All QL stretches,  calf stretch, supine blue band pull down for core Transversus abdominus,  hooklying ball squeeze.     Consulted and Agree with Plan of Care  Patient       Patient will benefit from skilled therapeutic intervention in order to improve the following deficits and impairments:     Visit Diagnosis: Radiculopathy, lumbar region     Problem List Patient Active Problem List   Diagnosis Date Noted  . Paranoid schizophrenia ([place]) 12/21/2018  . Preventative health care 09/29/2016  . HTN (hypertension) 02/18/2016  . Hyperlipidemia 02/18/2016  . Fatigue 02/18/2016  . Low back pain 02/18/2016  . Abdominal pain 11/27/2014  . Schizophrenia, paranoid type ([place]) 09/17/2012    Class: Acute  . Cannabis abuse 09/17/2012    HARRIS,KAREN PTA 01/19/2019, 9:25 AM  [facility] [address] [place], [place], [address] Phone: [phone]   Fax:  [phone]  Name: Lucas Knapp MRN: [id number] Date of Birth: 03-26-1986
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
## 2019-01-24 ENCOUNTER — Ambulatory Visit: Payer: Medicare HMO | Attending: Family | Admitting: Physical Therapy

## 2019-01-24 ENCOUNTER — Encounter: Payer: Self-pay | Admitting: Physical Therapy

## 2019-01-24 ENCOUNTER — Telehealth: Payer: Self-pay | Admitting: Physical Therapy

## 2019-01-24 DIAGNOSIS — M5416 Radiculopathy, lumbar region: Secondary | ICD-10-CM | POA: Insufficient documentation

## 2019-01-24 NOTE — Telephone Encounter (Signed)
Called patient about this morning's no show.  He got the times mixed up and plans to be at his appt. Wed 2/5 at 8:00 am Karie Mainland, PT 01/24/19 1:17 PM Phone: [phone] Fax: [phone]

## 2019-01-26 ENCOUNTER — Ambulatory Visit: Payer: Medicare HMO | Admitting: Physical Therapy

## 2019-01-26 ENCOUNTER — Encounter: Payer: Self-pay | Admitting: Physical Therapy

## 2019-01-26 DIAGNOSIS — M5416 Radiculopathy, lumbar region: Secondary | ICD-10-CM
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
## 2019-01-31 ENCOUNTER — Ambulatory Visit: Payer: Medicare HMO | Admitting: Physical Therapy

## 2019-01-31 DIAGNOSIS — M5416 Radiculopathy, lumbar region: Secondary | ICD-10-CM

## 2019-01-31 NOTE — Therapy (Signed)
[place], [place], [address] Phone: [phone]   Fax:  [phone]  Physical Therapy Treatment  Patient Details  Name: Lucas Knapp MRN: [id number] Date of Birth: [birth year] Referring Provider (PT): Debbrah Alar , NP    Encounter Date: 01/31/2019  PT End of Session - 01/31/19 0802    Visit Number  16    Number of Visits  20    Date for PT Re-Evaluation  02/11/19    Authorization Type  [employer] approve until 07/24/2019     PT Start Time  0801    PT Stop Time  0851    PT Time Calculation (min)  50 min    Activity Tolerance  Patient tolerated treatment well    Behavior During Therapy  [facility] for tasks assessed/performed       Past Medical History:  Diagnosis Date  . Back pain   . Bipolar 1 disorder ([place])   . Hyperlipidemia   . Hypertension    pt has been prescribed HCTZ 12.5 mg daily. Pt was 140/80 on admission.   . Schizophrenia ([place])     No past surgical history on file.  There were no vitals filed for this visit.  Subjective Assessment - 01/31/19 0803    Subjective  My back got worse with the colder weather.  Its sore, about 4/10.      Currently in Pain?  Yes    Pain Score  4     Pain Location  Back    Pain Orientation  Left;Lower    Pain Descriptors / Indicators  Sore    Pain Type  Chronic pain    Pain Onset  More than a month ago    Pain Frequency  Intermittent    Aggravating Factors   cold, moving the wrong way     Pain Relieving Factors  stretch, heat             [facility] Adult PT Treatment/Exercise - 01/31/19 0001      Lumbar Exercises: Stretches   Lower Trunk Rotation  10 seconds      Lumbar Exercises: Aerobic   Nustep  6 minL 5 UE and LE       Lumbar Exercises: Standing   Lifting  From floor;10 reps    Lifting Weights (lbs)  15lb kettlebell     Lifting Limitations  then dead lift 15 lbs    x 10 cues for flat back      Lumbar Exercises: Supine   Pelvic Tilt  10  reps    Clam  20 reps    Clam Limitations  blue     Bridge  10 reps   blueband    Bridge with clamshell  10 reps    Bridge with [employer] Limitations  blue band     Other Supine Lumbar Exercises  hooklying shoulder extension blue band with core x 10       Lumbar Exercises: Sidelying   Other Sidelying Lumbar Exercises  QL stretch for L side to ease stiffness, cues for set up    used bolster for R side      Lumbar Exercises: Quadruped   Opposite Arm/Leg Raise  Right arm/Left leg;Left arm/Right leg;10 reps    Opposite Arm/Leg Raise Limitations  cues     Other Quadruped Lumbar Exercises  childs pose forward and then lateral x 2 each , 30 sec       Moist Heat Therapy   Number  Minutes Moist Heat  10 Minutes    Moist Heat Location  Lumbar Spine             PT Education - 01/31/19 0842    Education Details  lifting, POC, HEP     Person(s) Educated  Patient    Methods  Explanation;Demonstration;Handout    Comprehension  Verbalized understanding;Returned demonstration          PT Long Term Goals - 01/31/19 0844      PT LONG TERM GOAL #1   Title  Patient will be I with HEP    Status  Partially Met      PT LONG TERM GOAL #2   Title  Pt will be able to work , including lifting without pain exacerbation.     Baseline  lifts without pain in clinic , needed min cues for lifting and rotating     Status  On-going      PT LONG TERM GOAL #3   Title  Pt will be able to sit comfortably for meals, driving for up to 30 min     Status  Unable to assess      PT LONG TERM GOAL #4   Title  Pt will be able to demo proper lifting techniques to avoid increased pain     Status  Achieved      PT LONG TERM GOAL #5   Title  Pt will no longer have difficulty sleeping due to pain.    Status  Unable to assess      PT LONG TERM GOAL #6   Title  Pt will have no more LE pain (radicular symptoms) in LLE     Status  Partially Met            Plan - 01/31/19 0842    Clinical  Impression Statement  Patient cont to have back pain , 4/10 typically during the day, despite efforts to reduce pain with HEP, stretching, heat and dry needling.  He needed reinforcement with HEP.  He will be discharged next visit, did want to have dry needling one more time before DC.      PT Treatment/Interventions  ADLs/Self Care Home Management;Electrical Stimulation;Functional mobility training;Neuromuscular re-education;Cryotherapy;Ultrasound;Traction;Moist Heat;Therapeutic exercise;Therapeutic activities;Patient/family education;Manual techniques;Passive range of motion;Dry needling    PT Next Visit Plan  DN glutes, lumbar L2.  FOTO.  DC, with full HEP review and final check of goals     PT Home Exercise Plan  childs pose, bridge , post pelvic tilt, hip flexor stretch, hamstring stretch and knee to chest All QL stretches,  calf stretch, supine blue band pull down for core Transversus abdominus,  hooklying ball squeeze.     Consulted and Agree with Plan of Care  Patient       Patient will benefit from skilled therapeutic intervention in order to improve the following deficits and impairments:  Pain, Impaired flexibility, Increased fascial restricitons, Decreased strength, Decreased range of motion, Decreased mobility, Hypomobility, Obesity, Decreased endurance, Difficulty walking  Visit Diagnosis: Radiculopathy, lumbar region     Problem List Patient Active Problem List   Diagnosis Date Noted  . Paranoid schizophrenia ([place]) 12/21/2018  . Preventative health care 09/29/2016  . HTN (hypertension) 02/18/2016  . Hyperlipidemia 02/18/2016  . Fatigue 02/18/2016  . Low back pain 02/18/2016  . Abdominal pain 11/27/2014  . Schizophrenia, paranoid type ([place]) 09/17/2012    Class: Acute  . Cannabis abuse 09/17/2012    Valton Schwartz 01/31/2019, 8:47 AM  [facility] [address] [place], [place], [address] Phone: [phone]   Fax:   [phone]  Name: Lucas Knapp MRN: [id number] Date of Birth: 30-May-1986  Raeford Razor, PT 01/31/19 8:47 AM Phone: [phone] Fax: [phone]

## 2019-01-31 NOTE — Patient Instructions (Signed)
Bracing With Bridging (Hook-Lying)    With neutral spine, tighten pelvic floor and abdominals and hold. Lift bottom. Repeat _10__ times. Do _2__ sets a day.   Copyright  VHI. All rights reserved.    Shell    Prone, push torso back, folding over legs. Push hips toward heels, allowing head and arms to settle toward mat. Relax, breathing deeply into back for __3__ full breaths. Repeat __3__ times. Do __1-2__ sessions per day.  [email or web address].exer.[email or web address]   Copyright  VHI. All rights reserved.

## 2019-02-02 ENCOUNTER — Encounter: Payer: Self-pay | Admitting: Physical Therapy

## 2019-02-02 ENCOUNTER — Ambulatory Visit: Payer: Medicare HMO | Admitting: Physical Therapy

## 2019-02-02 DIAGNOSIS — M5416 Radiculopathy, lumbar region: Secondary | ICD-10-CM
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
## 2019-02-04 ENCOUNTER — Encounter: Payer: Self-pay | Admitting: Family

## 2019-02-04 ENCOUNTER — Ambulatory Visit (INDEPENDENT_AMBULATORY_CARE_PROVIDER_SITE_OTHER): Payer: Medicare HMO | Admitting: Family

## 2019-02-04 VITALS — BP 113/81 | HR 79 | Temp 98.9°F | Resp 16 | Ht 65.0 in | Wt 222.0 lb

## 2019-02-04 DIAGNOSIS — M7918 Myalgia, other site: Secondary | ICD-10-CM | POA: Diagnosis not present

## 2019-02-04 DIAGNOSIS — I10 Essential (primary) hypertension: Secondary | ICD-10-CM | POA: Diagnosis not present

## 2019-02-04 DIAGNOSIS — M5416 Radiculopathy, lumbar region: Secondary | ICD-10-CM | POA: Diagnosis not present

## 2019-02-04 NOTE — Progress Notes (Signed)
Subjective:    Patient ID: Lucas Knapp, male    DOB: Aug 18, 1986, 33 y.o.   MRN: [id number]  HPI  Patient is a 33 yr old male who presents today for follow up.  HTN- last visit we increased amlodipine from 5mg  to 10mg .  BP Readings from Last 3 Encounters:  02/04/19 113/81  12/31/18 (!) 151/84  12/21/18 (!) 138/105   Lumbar radiculopathy- was referred for MRI last visit. He reports that he has been doing PT and stretching.   abdominal pain- reports that yesterday he rolled from the couch to the floor and noticed some right sided abdominal pain. Notes pain is worse with certain movements.    Review of Systems    see HPI  Past Medical History:  Diagnosis Date  . Back pain   . Bipolar 1 disorder ([facility])   . Hyperlipidemia   . Hypertension    pt has been prescribed HCTZ 12.5 mg daily. Pt was 140/80 on admission.   . Schizophrenia [facility])      Social History   Socioeconomic History  . Marital status: Single    Spouse name: Not on file  . Number of children: Not on file  . Years of education: Not on file  . Highest education level: Not on file  Occupational History  . Not on file  Social Needs  . Financial resource strain: Not on file  . Food insecurity:    Worry: Not on file    Inability: Not on file  . Transportation needs:    Medical: Not on file    Non-medical: Not on file  Tobacco Use  . Smoking status: Current Every Day Smoker    Types: Cigarettes  . Smokeless tobacco: Never Used  . Tobacco comment: 10-15  Substance and Sexual Activity  . Alcohol use: Yes    Alcohol/week: 0.0 standard drinks    Comment: occasionally --"I need to drink more"  . Drug use: Yes    Types: Marijuana    Comment: Pt endorsed used of marijuana  . Sexual activity: Not Currently  Lifestyle  . Physical activity:    Days per week: Not on file    Minutes per session: Not on file  . Stress: Not on file  Relationships  . Social connections:    Talks on phone: Not on file   Gets together: Not on file    Attends religious service: Not on file    Active member of club or organization: Not on file    Attends meetings of clubs or organizations: Not on file    Relationship status: Not on file  . Intimate partner violence:    Fear of current or ex partner: Not on file    Emotionally abused: Not on file    Physically abused: Not on file    Forced sexual activity: Not on file  Other Topics Concern  . 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    Single   No children   Completed college (bachelors in Statistician)   Enjoys swimming, playing pool, walking   Grew up [place].  Has identical twin and 2 other brothers in [place]    No past surgical history on file.  Family History  Problem Relation Age of Onset  . Hypertension Father   . Hyperlipidemia Father   . Hypertension Paternal Grandmother   .  Hypertension Paternal Grandfather   . Hypertension Brother        identical twin  . Psychosis Brother        "mental issues"      Allergies  Allergen Reactions  . Geodon [Ziprasidone Hcl] Other (See Comments)    Reaction:  Made pts throat dry     Current Outpatient Medications on File Prior to Visit  Medication Sig Dispense Refill  . amLODipine (NORVASC) 10 MG tablet Take 1 tablet (10 mg total) by mouth daily. 30 tablet 3  . haloperidol (HALDOL) 10 MG tablet Take 1 tablet (10 mg total) by mouth daily. (in the morning): For mood control 30 tablet 0  . haloperidol (HALDOL) 5 MG tablet Take 3 tablets (15 mg total) by mouth at bedtime. For mood control 90 tablet 0  . hydrOXYzine (ATARAX/VISTARIL) 25 MG tablet Take 1 tablet (25 mg total) by mouth 3 (three) times daily as needed for anxiety. 60 tablet 0  . loratadine (CLARITIN) 10 MG tablet Take 1 tablet (10 mg total) by mouth daily. (May buy from over the counter): For allergies 30 tablet 11  . traZODone  (DESYREL) 100 MG tablet Take 2 tablets (200 mg total) by mouth at bedtime. For sleep 60 tablet 0   No current facility-administered medications on file prior to visit.     BP 113/81 (BP Location: Right Arm, Patient Position: Sitting, Cuff Size: Large)   Pulse 79   Temp 98.9 F (37.2 C) (Oral)   Resp 16   Ht 5\' 5"  (1.651 m)   Wt 222 lb (100.7 kg)   SpO2 97%   BMI 36.94 kg/m    Objective:   Physical Exam Constitutional:      General: He is not in acute distress.    Appearance: He is well-developed.  HENT:     Head: Normocephalic and atraumatic.  Cardiovascular:     Rate and Rhythm: Normal rate and regular rhythm.     Heart sounds: No murmur.  Pulmonary:     Effort: Pulmonary effort is normal. No respiratory distress.     Breath sounds: Normal breath sounds. No wheezing or rales.  Abdominal:     General: Bowel sounds are normal.     Palpations: Abdomen is soft.     Tenderness: There is no abdominal tenderness.  Skin:    General: Skin is warm and dry.  Neurological:     Mental Status: He is alert and oriented to person, place, and time.  Psychiatric:        Behavior: Behavior normal.        Thought Content: Thought content normal.           Assessment & Plan:  Lumbar radiculopathy- improving with PT/Stretching, continue same. Advised pt ok to hold off on MRI since he is improving.  Musculoskeletal abdominal pain- advised pt to call if symptoms worsen or if not improved in 1 week.  HTN- bp is improved on increased dose of amlodipine. Continue same.

## 2019-02-04 NOTE — Patient Instructions (Signed)
Please continue physical therapy and stretching. Please let me know if abdominal soreness worsens or if not improved in 1 week.

## 2019-02-07 ENCOUNTER — Ambulatory Visit: Payer: Medicare HMO | Admitting: Physical Therapy

## 2019-02-07 ENCOUNTER — Encounter: Payer: Self-pay | Admitting: Physical Therapy

## 2019-02-07 DIAGNOSIS — M5416 Radiculopathy, lumbar region: Secondary | ICD-10-CM | POA: Diagnosis not present

## 2019-02-07 NOTE — Therapy (Signed)
[place], [place], [address] Phone: [phone]   Fax:  [phone]  Physical Therapy Treatment  Patient Details  Name: Istvan Behar MRN: [id number] Date of Birth: 01/23/1986 Referring Provider (PT): Debbrah Alar , NP    Encounter Date: 02/07/2019  PT End of Session - 02/07/19 0758    Visit Number  18    Number of Visits  20    Date for PT Re-Evaluation  02/11/19    Authorization Type  [employer] approve until 07/24/2019     PT Start Time  0800    PT Stop Time  0851    PT Time Calculation (min)  51 min    Activity Tolerance  Patient tolerated treatment well    Behavior During Therapy  [facility] for tasks assessed/performed       Past Medical History:  Diagnosis Date  . Back pain   . Bipolar 1 disorder ([place])   . Hyperlipidemia   . Hypertension    pt has been prescribed HCTZ 12.5 mg daily. Pt was 140/80 on admission.   . Schizophrenia ([place])     History reviewed. No pertinent surgical history.  There were no vitals filed for this visit.  Subjective Assessment - 02/07/19 0808    Subjective  I pulled a muscle in my abs, was rolling off the couch on to the floor.  It still sore.  My back is OK, it always hurts.      Currently in Pain?  Yes    Pain Score  3     Pain Location  Back    Pain Orientation  Left;Lower    Pain Descriptors / Indicators  Sore;Aching    Pain Type  Chronic pain    Pain Onset  More than a month ago    Pain Frequency  Intermittent    Pain Relieving Factors  stretch, heat       \    [facility] Adult PT Treatment/Exercise - 02/07/19 0001      Lumbar Exercises: Stretches   Active Hamstring Stretch  3 reps;20 seconds    Lower Trunk Rotation  10 seconds    Lower Trunk Rotation Limitations  x 10    Piriformis Stretch  3 reps;Left;Right;30 seconds      Lumbar Exercises: Aerobic   Nustep  6 min 5 UE and LE       Lumbar Exercises: Machines for Strengthening   Cybex Knee  Extension  2 x 10 , 30 lbs     Leg Press  3 plates 2 x 10 reps cues     Other Lumbar Machine Exercise  used leg press for heel raises x 20 cues to avoid hyperext knees , changed to doing off step for better control       Lumbar Exercises: Supine   Pelvic Tilt  10 reps    Bridge  10 reps      Lumbar Exercises: Sidelying   Other Sidelying Lumbar Exercises  sidelying upper trunk rotation x 5 each side       Lumbar Exercises: Quadruped   Other Quadruped Lumbar Exercises  childs pose forward and then lateral x 2 each , 30 sec       Moist Heat Therapy   Number Minutes Moist Heat  10 Minutes    Moist Heat Location  Lumbar Spine                  PT Long Term Goals - 02/07/19 6644  PT LONG TERM GOAL #1   Title  Patient will be I with HEP    Status  Achieved      PT LONG TERM GOAL #2   Title  Pt will be able to work , including lifting without pain exacerbation.     Status  Achieved      PT LONG TERM GOAL #3   Title  Pt will be able to sit comfortably for meals, driving for up to 30 min     Baseline  says he has to get out and stretch after about 45 min     Status  Achieved      PT LONG TERM GOAL #4   Title  Pt will be able to demo proper lifting techniques to avoid increased pain     Status  Achieved      PT LONG TERM GOAL #5   Title  Pt will no longer have difficulty sleeping due to pain.    Baseline  does wake up but falls back asleep easily     Status  Achieved      PT LONG TERM GOAL #6   Title  Pt will have no more LE pain (radicular symptoms) in LLE     Baseline  has not done that in awhile     Status  Achieved            Plan - 02/07/19 0757    Clinical Impression Statement  Patient has not had leg pain in some time.  Many of his goals are met.  He is back at [employer] mostly doing water exercise.  Plans to begin some light lifting, cardio machines and aiming for 3 times per week.   No pain after session., befor eheat.     PT  Treatment/Interventions  ADLs/Self Care Home Management;Electrical Stimulation;Functional mobility training;Neuromuscular re-education;Cryotherapy;Ultrasound;Traction;Moist Heat;Therapeutic exercise;Therapeutic activities;Patient/family education;Manual techniques;Passive range of motion;Dry needling    PT Next Visit Plan  DC, FOTO and try some gym machines, DN if needed    PT Home Exercise Plan  childs pose, bridge , post pelvic tilt, hip flexor stretch, hamstring stretch and knee to chest All QL stretches,  calf stretch, supine blue band pull down for core Transversus abdominus,  hooklying ball squeeze.     Consulted and Agree with Plan of Care  Patient       Patient will benefit from skilled therapeutic intervention in order to improve the following deficits and impairments:  Pain, Impaired flexibility, Increased fascial restricitons, Decreased strength, Decreased range of motion, Decreased mobility, Hypomobility, Obesity, Decreased endurance, Difficulty walking  Visit Diagnosis: Radiculopathy, lumbar region     Problem List Patient Active Problem List   Diagnosis Date Noted  . Paranoid schizophrenia ([place]) 12/21/2018  . Preventative health care 09/29/2016  . HTN (hypertension) 02/18/2016  . Hyperlipidemia 02/18/2016  . Fatigue 02/18/2016  . Low back pain 02/18/2016  . Abdominal pain 11/27/2014  . Schizophrenia, paranoid type ([place]) 09/17/2012    Class: Acute  . Cannabis abuse 09/17/2012    , 02/07/2019, 8:44 AM  [facility] [address] [place], [place], [address] Phone: [phone]   Fax:  [phone]  Name: Alisha Burgo MRN: [id number] Date of Birth: 02-12-1986   Raeford Razor, PT 02/07/19 8:44 AM Phone: [phone] Fax: [phone]

## 2019-02-08 DIAGNOSIS — F250 Schizoaffective disorder, bipolar type: Secondary | ICD-10-CM | POA: Diagnosis not present

## 2019-02-09 ENCOUNTER — Ambulatory Visit: Payer: Medicare HMO | Admitting: Physical Therapy

## 2019-02-09 ENCOUNTER — Encounter: Payer: Self-pay | Admitting: Physical Therapy

## 2019-02-09 DIAGNOSIS — M5416 Radiculopathy, lumbar region: Secondary | ICD-10-CM | POA: Diagnosis not present

## 2019-02-10 ENCOUNTER — Encounter: Payer: Self-pay | Admitting: Physical Therapy
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
## 2019-02-22 ENCOUNTER — Ambulatory Visit: Payer: Self-pay | Admitting: Sports Medicine

## 2019-05-04 ENCOUNTER — Other Ambulatory Visit: Payer: Self-pay

## 2019-05-04 ENCOUNTER — Ambulatory Visit (INDEPENDENT_AMBULATORY_CARE_PROVIDER_SITE_OTHER): Payer: Medicare HMO | Admitting: Family

## 2019-05-04 ENCOUNTER — Telehealth: Payer: Self-pay | Admitting: Family

## 2019-05-04 DIAGNOSIS — F200 Paranoid schizophrenia: Secondary | ICD-10-CM

## 2019-05-04 DIAGNOSIS — I10 Essential (primary) hypertension: Secondary | ICD-10-CM

## 2019-05-04 DIAGNOSIS — F319 Bipolar disorder, unspecified: Secondary | ICD-10-CM

## 2019-05-04 DIAGNOSIS — J302 Other seasonal allergic rhinitis: Secondary | ICD-10-CM | POA: Diagnosis not present

## 2019-05-04 DIAGNOSIS — F250 Schizoaffective disorder, bipolar type: Secondary | ICD-10-CM | POA: Diagnosis not present

## 2019-05-04 MED ORDER — AMLODIPINE BESYLATE 10 MG PO TABS: 10.0000 mg | ORAL_TABLET | Freq: Every day | ORAL | 1 refills | Status: DC

## 2019-05-04 NOTE — Progress Notes (Signed)
Virtual Visit via Video Note  I connected with Lucas Knapp on 05/04/19 at 11:20 AM EDT by telephone after a failed attempt at a video visit due to technical issues on the patient's end. This visit type was conducted due to national recommendations for restrictions regarding the COVID-19 Pandemic (e.g. social distancing).  This format is felt to be most appropriate for this patient at this time.   I discussed the limitations of evaluation and management by telemedicine and the availability of in person appointments. The patient expressed understanding and agreed to proceed.  Only the patient and myself were on today's video visit. The patient was at home and I was at home at the time of today's visit.   History of Present Illness:  Patient is a 33 yr old male who presents today for follow up.  HTN- reports that his last bp 3 days ago was 140/87. Reports good compliance with amlodipine.  BP Readings from Last 3 Encounters:  02/04/19 113/81  12/31/18 (!) 151/84  12/21/18 (!) 138/105   Seasonal allergies- reports improvement on claritin.  Bipolar disorder/schizophrenia- reports that he has been taking his medication and his mood has been good.   He is not working. He is collecting unemployment.    Observations/Objective:   Gen: Awake, alert, no acute distress Resp: Breathing sounds even and non-labored Psych: calm/pleasant demeanor Neuro: Alert and Oriented x 3, speech sounds clear.   Assessment and Plan:  HTN-fair bp control. Advised pt to continue current dose of amlodipine and to plan to follow up in the office in 3 months.   Bipolar disorder/schizophrenia- appears clinically stable/improved with improved med compliance. Defer management to psychiatry. I reinforced the importance of ongoing compliance with medication.  Seasonal allergies- improved with claritin. Continue same.    A total of 11  minutes were spent with the patient during this encounter.    Follow Up  Instructions:    I discussed the assessment and treatment plan with the patient. The patient was provided an opportunity to ask questions and all were answered. The patient agreed with the plan and demonstrated an understanding of the instructions.   The patient was advised to call back or seek an in-person evaluation if the symptoms worsen or if the condition fails to improve as anticipated.    Lemont Fillers, NP

## 2019-05-04 NOTE — Telephone Encounter (Signed)
Return in about 3 months (around 08/04/2019) for follow up visit... called pt left msg

## 2019-05-06 ENCOUNTER — Ambulatory Visit: Payer: Self-pay | Admitting: Family

## 2019-07-03 ENCOUNTER — Encounter (HOSPITAL_COMMUNITY): Payer: Self-pay

## 2019-07-03 ENCOUNTER — Emergency Department (HOSPITAL_COMMUNITY)
Admission: EM | Admit: 2019-07-03 | Discharge: 2019-07-03 | Disposition: A | Discharge status: Home / Self Care | Payer: Medicare HMO | Attending: Emergency Medicine | Admitting: Emergency Medicine

## 2019-07-03 ENCOUNTER — Other Ambulatory Visit: Payer: Self-pay

## 2019-07-03 ENCOUNTER — Emergency Department (HOSPITAL_COMMUNITY): Payer: Medicare HMO

## 2019-07-03 DIAGNOSIS — I10 Essential (primary) hypertension: Secondary | ICD-10-CM | POA: Diagnosis not present

## 2019-07-03 DIAGNOSIS — F250 Schizoaffective disorder, bipolar type: Secondary | ICD-10-CM | POA: Insufficient documentation

## 2019-07-03 DIAGNOSIS — R51 Headache: Secondary | ICD-10-CM | POA: Insufficient documentation

## 2019-07-03 DIAGNOSIS — K2100 Gastro-esophageal reflux disease with esophagitis, without bleeding: Secondary | ICD-10-CM

## 2019-07-03 DIAGNOSIS — R079 Chest pain, unspecified: Secondary | ICD-10-CM | POA: Diagnosis not present

## 2019-07-03 DIAGNOSIS — F1290 Cannabis use, unspecified, uncomplicated: Secondary | ICD-10-CM | POA: Diagnosis not present

## 2019-07-03 DIAGNOSIS — F17210 Nicotine dependence, cigarettes, uncomplicated: Secondary | ICD-10-CM | POA: Insufficient documentation

## 2019-07-03 DIAGNOSIS — I959 Hypotension, unspecified: Secondary | ICD-10-CM | POA: Diagnosis not present

## 2019-07-03 DIAGNOSIS — R1111 Vomiting without nausea: Secondary | ICD-10-CM | POA: Diagnosis not present

## 2019-07-03 DIAGNOSIS — R1013 Epigastric pain: Secondary | ICD-10-CM | POA: Diagnosis not present

## 2019-07-03 DIAGNOSIS — I213 ST elevation (STEMI) myocardial infarction of unspecified site: Secondary | ICD-10-CM | POA: Diagnosis not present

## 2019-07-03 DIAGNOSIS — R112 Nausea with vomiting, unspecified: Secondary | ICD-10-CM | POA: Diagnosis not present

## 2019-07-03 DIAGNOSIS — R0789 Other chest pain: Secondary | ICD-10-CM | POA: Insufficient documentation

## 2019-07-03 DIAGNOSIS — Z79899 Other long term (current) drug therapy: Secondary | ICD-10-CM | POA: Insufficient documentation

## 2019-07-03 DIAGNOSIS — R0902 Hypoxemia: Secondary | ICD-10-CM | POA: Diagnosis not present

## 2019-07-03 LAB — CBG MONITORING, ED: Glucose-Capillary: 72 mg/dL (ref 70–99)

## 2019-07-03 NOTE — ED Notes (Signed)
Patient verbalizes understanding of discharge instructions. Opportunity for questioning and answers were provided. Armband removed by staff, pt discharged from ED.  

## 2019-07-03 NOTE — ED Provider Notes (Signed)
[facility] Provider Note   CSN: [id number] Arrival date & time: 07/03/19  1944     History   Chief Complaint Chief Complaint  Patient presents with  . Chest Pain  . Headache    HPI Lucas Knapp is a 33 y.o. male.     Patient is a 33 year old male with a history of hypertension, hyperlipidemia and schizophrenia on Haldol presenting today with complaints of chest pain.  Patient states that he was feeling in his normal state of health and went to visit a friend.  They were sitting out in the yard and after he had drank 2 beers he started not feeling great.  He said he started feeling very hot so he went inside and sat down to cool off.  He said then he started to develop some pain in his left lower chest area that he described as an ache and pressure.  It then moved to the center of his chest and shortly after that he vomited 3 times.  Since then the pain has resolved and he is now feeling hungry.  He does note that he had not eaten anything today before drinking the beer.  He has not had cough, shortness of breath or fever.  He has no abdominal pain or chest pain now.  He has a mild headache but otherwise that he feels pretty good.  He has no prior history of cardiac disease and family history of hypertension and hyperlipidemia but nobody with MI.  He does smoke cigarettes but does not use cocaine.  The history is provided by the patient.  Chest Pain Pain location:  Epigastric and L chest Pain quality: aching   Pain radiates to:  Does not radiate Pain severity:  Moderate Onset quality:  Sudden Duration:  10 minutes Timing:  Constant Progression:  Resolved Chronicity:  New Associated symptoms: headache, nausea and vomiting   Associated symptoms: no back pain, no cough, no diaphoresis, no fever, no lower extremity edema, no near-syncope, no shortness of breath, no syncope and no weakness   Risk factors: high cholesterol and hypertension    Headache Associated symptoms: nausea and vomiting   Associated symptoms: no back pain, no cough, no fever, no near-syncope, no syncope and no weakness     Past Medical History:  Diagnosis Date  . Back pain   . Bipolar 1 disorder ([facility])   . Hyperlipidemia   . Hypertension    pt has been prescribed HCTZ 12.5 mg daily. Pt was 140/80 on admission.   . Schizophrenia [facility](HCC)     Patient Active Problem List   Diagnosis Date Noted  . Paranoid schizophrenia ([facility]) 12/21/2018  . Preventative health care 09/29/2016  . HTN (hypertension) 02/18/2016  . Hyperlipidemia 02/18/2016  . Fatigue 02/18/2016  . Low back pain 02/18/2016  . Abdominal pain 11/27/2014  . Schizophrenia, paranoid type ([facility]) 09/17/2012    Class: Acute  . Cannabis abuse 09/17/2012    History reviewed. No pertinent surgical history.      Home Medications    Prior to Admission medications   Medication Sig Start Date End Date Taking? Authorizing Provider  amLODipine (NORVASC) 10 MG tablet Take 1 tablet (10 mg total) by mouth daily. 05/04/19   Sandford Craze'Sullivan, Melissa, NP  haloperidol (HALDOL) 10 MG tablet Take 1 tablet (10 mg total) by mouth daily. (in the morning): For mood control 12/25/18   Armandina StammerNwoko, Agnes I, NP  haloperidol (HALDOL) 5 MG tablet Take 3 tablets (15 mg total)  by mouth at bedtime. For mood control 12/24/18   Armandina StammerNwoko, Agnes I, NP  hydrOXYzine (ATARAX/VISTARIL) 25 MG tablet Take 1 tablet (25 mg total) by mouth 3 (three) times daily as needed for anxiety. 12/24/18   Armandina StammerNwoko, Agnes I, NP  loratadine (CLARITIN) 10 MG tablet Take 1 tablet (10 mg total) by mouth daily. (May buy from over the counter): For allergies 12/24/18   Armandina StammerNwoko, Agnes I, NP  traZODone (DESYREL) 100 MG tablet Take 2 tablets (200 mg total) by mouth at bedtime. For sleep 12/24/18   Sanjuana KavaNwoko, Agnes I, NP    Family History Family History  Problem Relation Age of Onset  . Hypertension Father   . Hyperlipidemia Father   . Hypertension Paternal Grandmother   .  Hypertension Paternal Grandfather   . Hypertension Brother        identical twin  . Psychosis Brother        "mental issues"      Social History Social History   Tobacco Use  . Smoking status: Current Every Day Smoker    Types: Cigarettes  . Smokeless tobacco: Never Used  . Tobacco comment: 10-15  Substance Use Topics  . Alcohol use: Yes    Alcohol/week: 0.0 standard drinks    Comment: occasionally --"I need to drink more"  . Drug use: Yes    Types: Marijuana    Comment: Pt endorsed used of marijuana     Allergies   Geodon [ziprasidone hcl]   Review of Systems Review of Systems  Constitutional: Negative for diaphoresis and fever.  Respiratory: Negative for cough and shortness of breath.   Cardiovascular: Positive for chest pain. Negative for syncope and near-syncope.  Gastrointestinal: Positive for nausea and vomiting.  Musculoskeletal: Negative for back pain.  Neurological: Positive for headaches. Negative for weakness.  All other systems reviewed and are negative.    Physical Exam Updated Vital Signs BP 130/85 (BP Location: Right Arm)   Pulse 67   Temp 98.1 F (36.7 C) (Oral)   Resp (!) 21   Ht 5\' 3"  (1.6 m)   Wt 101.2 kg   SpO2 95%   BMI 39.50 kg/m   Physical Exam Vitals signs and nursing note reviewed.  Constitutional:      General: He is not in acute distress.    Appearance: He is well-developed and normal weight.  HENT:     Head: Normocephalic and atraumatic.  Eyes:     Conjunctiva/sclera: Conjunctivae normal.     Pupils: Pupils are equal, round, and reactive to light.  Neck:     Musculoskeletal: Normal range of motion and neck supple.  Cardiovascular:     Rate and Rhythm: Normal rate and regular rhythm.     Pulses: Normal pulses.     Heart sounds: No murmur.  Pulmonary:     Effort: Pulmonary effort is normal. No respiratory distress.     Breath sounds: Normal breath sounds. No wheezing or rales.  Abdominal:     General: There is no  distension.     Palpations: Abdomen is soft.     Tenderness: There is no abdominal tenderness. There is no guarding or rebound.  Musculoskeletal: Normal range of motion.        General: No tenderness.  Skin:    General: Skin is warm and dry.     Capillary Refill: Capillary refill takes less than 2 seconds.     Findings: No erythema or rash.  Neurological:     General: No focal deficit  present.     Mental Status: He is alert and oriented to person, place, and time. Mental status is at baseline.  Psychiatric:        Mood and Affect: Mood normal.        Behavior: Behavior normal.        Thought Content: Thought content normal.      ED Treatments / Results  Labs (all labs ordered are listed, but only abnormal results are displayed) Labs Reviewed  CBG MONITORING, ED    EKG EKG Interpretation  Date/Time:  Sunday July 03 2019 19:46:11 EDT Ventricular Rate:  69 PR Interval:    QRS Duration: 99 QT Interval:  362 QTC Calculation: 388 R Axis:   19 Text Interpretation:  Sinus rhythm RSR' in V1 or V2, right VCD or RVH Early repolarization No significant change since last tracing Confirmed by Blanchie Dessert [phone]) on 07/03/2019 7:49:51 PM   Radiology Dg Chest Port 1 View  Result Date: 07/03/2019 CLINICAL DATA:  Chest pain. EXAM: PORTABLE CHEST 1 VIEW COMPARISON:  November 26, 2014 FINDINGS: Mild atelectasis in the left mid lung. The heart, hila, mediastinum, lungs, and pleura are otherwise normal. IMPRESSION: No active disease. Electronically Signed   By: Dorise Bullion III M.D   On: 07/03/2019 20:45    Procedures Procedures (including critical care time)  Medications Ordered in ED Medications - No data to display   Initial Impression / Assessment and Plan / ED Course  I have reviewed the triage vital signs and the nursing notes.  Pertinent labs & imaging results that were available during my care of the patient were reviewed by me and considered in my medical decision  making (see chart for details).        Pt with atypical story for CP most likely related to GI origin from drinking beer on an empty stomach in the heat causing him to vomit.  Pt is now symptoms free.  Hear score of 2.  Low suspiciong for PE, Dissection, infectious pathology or ACS.  PERC neg.  EKG with early repol but o/w unchanged.  CXR wnl and blood sugar of 72.  Pt eating now and symptoms free.  Feel he is stable for d/c.   Final Clinical Impressions(s) / ED Diagnoses   Final diagnoses:  Atypical chest pain  Gastroesophageal reflux disease with esophagitis    ED Discharge Orders    None       Blanchie Dessert, MD 07/03/19 [phone]

## 2019-07-03 NOTE — Discharge Instructions (Signed)
Your EKG and chest x-ray today look normal.  No signs of heart attack.  Symptoms today were most likely from your stomach being irritated.

## 2019-07-03 NOTE — ED Triage Notes (Addendum)
Pt brought in by [facility] from home for chest pain, nausea, vomiting, and headache. Pt states he was outside today, had x2 beers, went inside and had x1 episode of vomiting. Pt states his friend is an NP who recommended he come to the ED. Pt has hx pf htn and hyperlipidemia. Pt states he has not eaten today, denies current chest pain. Pt endorses 10/10 anterior headache.

## 2019-07-03 NOTE — ED Notes (Signed)
CBG- 77 

## 2019-08-01 ENCOUNTER — Other Ambulatory Visit: Payer: Self-pay | Admitting: Family

## 2019-08-04 DIAGNOSIS — F250 Schizoaffective disorder, bipolar type: Secondary | ICD-10-CM | POA: Diagnosis not present

## 2019-10-25 DIAGNOSIS — F250 Schizoaffective disorder, bipolar type: Secondary | ICD-10-CM | POA: Diagnosis not present

## 2019-10-28 DIAGNOSIS — F250 Schizoaffective disorder, bipolar type: Secondary | ICD-10-CM | POA: Diagnosis not present

## 2019-12-23 ENCOUNTER — Encounter (HOSPITAL_BASED_OUTPATIENT_CLINIC_OR_DEPARTMENT_OTHER): Payer: Self-pay | Admitting: *Deleted

## 2019-12-23 ENCOUNTER — Emergency Department (HOSPITAL_BASED_OUTPATIENT_CLINIC_OR_DEPARTMENT_OTHER)
Admission: EM | Admit: 2019-12-23 | Discharge: 2019-12-24 | Disposition: A | Discharge status: Other Acute Inpatient Hospital | Payer: Medicare (Managed Care) | Attending: Emergency Medicine | Admitting: Emergency Medicine

## 2019-12-23 ENCOUNTER — Other Ambulatory Visit: Payer: Self-pay

## 2019-12-23 DIAGNOSIS — F17210 Nicotine dependence, cigarettes, uncomplicated: Secondary | ICD-10-CM | POA: Insufficient documentation

## 2019-12-23 DIAGNOSIS — Z79899 Other long term (current) drug therapy: Secondary | ICD-10-CM | POA: Diagnosis not present

## 2019-12-23 DIAGNOSIS — Z20822 Contact with and (suspected) exposure to covid-19: Secondary | ICD-10-CM | POA: Diagnosis not present

## 2019-12-23 DIAGNOSIS — I10 Essential (primary) hypertension: Secondary | ICD-10-CM | POA: Diagnosis not present

## 2019-12-23 DIAGNOSIS — R451 Restlessness and agitation: Secondary | ICD-10-CM | POA: Diagnosis not present

## 2019-12-23 DIAGNOSIS — Z046 Encounter for general psychiatric examination, requested by authority: Secondary | ICD-10-CM

## 2019-12-23 DIAGNOSIS — F200 Paranoid schizophrenia: Secondary | ICD-10-CM | POA: Diagnosis not present

## 2019-12-23 LAB — URINALYSIS, ROUTINE W REFLEX MICROSCOPIC
Bilirubin Urine: NEGATIVE
Glucose, UA: NEGATIVE mg/dL
Hgb urine dipstick: NEGATIVE
Ketones, ur: NEGATIVE mg/dL
Leukocytes,Ua: NEGATIVE
Nitrite: NEGATIVE
Protein, ur: NEGATIVE mg/dL
Specific Gravity, Urine: 1.015 (ref 1.005–1.030)
pH: 6.5 (ref 5.0–8.0)

## 2019-12-23 LAB — ETHANOL: Alcohol, Ethyl (B): <10 mg/dL (ref <10)

## 2019-12-23 LAB — CBC WITH DIFFERENTIAL/PLATELET
Abs Immature Granulocytes: 0.02 10*3/uL (ref 0.00–0.07)
Basophils Absolute: 0.1 10*3/uL (ref 0.0–0.1)
Basophils Relative: 1 %
Eosinophils Absolute: 0.2 10*3/uL (ref 0.0–0.5)
Eosinophils Relative: 2 %
HCT: 47.4 % (ref 39.0–52.0)
Hemoglobin: 16.1 g/dL (ref 13.0–17.0)
Immature Granulocytes: 0 %
Lymphocytes Relative: 46 %
Lymphs Abs: 5 10*3/uL — ABNORMAL HIGH (ref 0.7–4.0)
MCH: 31.4 pg (ref 26.0–34.0)
MCHC: 34 g/dL (ref 30.0–36.0)
MCV: 92.4 fL (ref 80.0–100.0)
Monocytes Absolute: 0.6 10*3/uL (ref 0.1–1.0)
Monocytes Relative: 5 %
Neutro Abs: 4.9 10*3/uL (ref 1.7–7.7)
Neutrophils Relative %: 46 %
Platelets: 400 10*3/uL (ref 150–400)
RBC: 5.13 MIL/uL (ref 4.22–5.81)
RDW: 12.3 % (ref 11.5–15.5)
WBC: 10.7 10*3/uL — ABNORMAL HIGH (ref 4.0–10.5)
nRBC: 0 % (ref 0.0–0.2)

## 2019-12-23 LAB — COMPREHENSIVE METABOLIC PANEL
ALT: 30 U/L (ref 0–44)
AST: 30 U/L (ref 15–41)
Albumin: 4.6 g/dL (ref 3.5–5.0)
Alkaline Phosphatase: 74 U/L (ref 38–126)
Anion gap: 9 (ref 5–15)
BUN: 10 mg/dL (ref 6–20)
CO2: 24 mmol/L (ref 22–32)
Calcium: 9.9 mg/dL (ref 8.9–10.3)
Chloride: 105 mmol/L (ref 98–111)
Creatinine, Ser: 1.04 mg/dL (ref 0.61–1.24)
GFR calc Af Amer: >60 mL/min (ref >60)
GFR calc non Af Amer: >60 mL/min (ref >60)
Glucose, Bld: 99 mg/dL (ref 70–99)
Potassium: 4.2 mmol/L (ref 3.5–5.1)
Sodium: 138 mmol/L (ref 135–145)
Total Bilirubin: 0.5 mg/dL (ref 0.3–1.2)
Total Protein: 7.5 g/dL (ref 6.5–8.1)

## 2019-12-23 LAB — RAPID URINE DRUG SCREEN, HOSP PERFORMED
Amphetamines: NOT DETECTED
Barbiturates: NOT DETECTED
Benzodiazepines: NOT DETECTED
Cocaine: NOT DETECTED
Opiates: NOT DETECTED
Tetrahydrocannabinol: POSITIVE — AB

## 2019-12-23 LAB — RESPIRATORY PANEL BY RT PCR (FLU A&B, COVID)
Influenza A by PCR: NEGATIVE
Influenza B by PCR: NEGATIVE
SARS Coronavirus 2 by RT PCR: NEGATIVE

## 2019-12-23 LAB — ACETAMINOPHEN LEVEL: Acetaminophen (Tylenol), Serum: <10 ug/mL — ABNORMAL LOW (ref 10–30)

## 2019-12-23 MED ORDER — RISPERIDONE 2 MG PO TBDP
2.0000 mg | ORAL_TABLET | Freq: Three times a day (TID) | ORAL | Status: DC | PRN
  Filled 2019-12-23: qty 1

## 2019-12-23 MED ORDER — ZIPRASIDONE MESYLATE 20 MG IM SOLR
INTRAMUSCULAR | Status: AC
  Filled 2019-12-23: qty 20

## 2019-12-23 MED ORDER — ACETAMINOPHEN 325 MG PO TABS: 650.0000 mg | ORAL_TABLET | ORAL | Status: DC | PRN

## 2019-12-23 MED ORDER — HALOPERIDOL LACTATE 5 MG/ML IJ SOLN
INTRAMUSCULAR | Status: AC
  Filled 2019-12-23: qty 1

## 2019-12-23 MED ORDER — LORAZEPAM 2 MG/ML IJ SOLN
INTRAMUSCULAR | Status: AC
  Filled 2019-12-23: qty 1

## 2019-12-23 MED ORDER — LORAZEPAM 1 MG PO TABS: 1.0000 mg | ORAL_TABLET | ORAL | Status: DC | PRN

## 2019-12-23 MED ORDER — LORAZEPAM 2 MG/ML IJ SOLN
2.0000 mg | Freq: Once | INTRAMUSCULAR | Status: AC
  Administered 2019-12-23: 2 mg via INTRAMUSCULAR

## 2019-12-23 MED ORDER — HALOPERIDOL LACTATE 5 MG/ML IJ SOLN
5.0000 mg | Freq: Once | INTRAMUSCULAR | Status: AC
  Administered 2019-12-23: 5 mg via INTRAMUSCULAR

## 2019-12-23 NOTE — ED Notes (Signed)
Sitter at bedside, dozes at short intervals

## 2019-12-23 NOTE — ED Triage Notes (Addendum)
Pt states he is  needs a ride to [facility] .  PEr mother by phone Pt is off meds  X 2 months, flight of ideas, auditory hall verbalized. Brother with pt

## 2019-12-23 NOTE — ED Notes (Signed)
Cell phone placed in belongings cabinet, with name on it. Security going through [employer] and will use wand. Preparing frozen dinner

## 2019-12-23 NOTE — ED Notes (Signed)
Eating dinner, watching TV, PD remains outside door for safety

## 2019-12-23 NOTE — ED Notes (Signed)
Spoke with Debbe Odea from [facility], will call when he is able to do an eval, sleeping, drowsy at present

## 2019-12-23 NOTE — ED Notes (Signed)
Refused COVID swab, States," They did one when I got out of prison" Asks to just let him take a nap and then he will let nurse swab him. Drowsy, PD remains outside room for safety

## 2019-12-23 NOTE — ED Notes (Signed)
Patient is resting comfortably, sitter at bedside, cooperative

## 2019-12-23 NOTE — ED Notes (Signed)
Call brother if needed, [phone]. Brother states has been off his meds since Oct and been carrying around all his stuff in a suitcase

## 2019-12-23 NOTE — ED Notes (Addendum)
Security and [employer] officers outside room, brother at bedside . Lying on stretcher, watching TV

## 2019-12-23 NOTE — ED Notes (Signed)
Has 3 bags of belongings in patient cabinet

## 2019-12-23 NOTE — ED Notes (Signed)
Pt given frozen dinner. °

## 2019-12-23 NOTE — ED Provider Notes (Signed)
[facility] EMERGENCY DEPARTMENT Provider Note   CSN: [id number] Arrival date & time: 12/23/19  1615     History Chief Complaint  Patient presents with  . Medical Clearance    Lucas Knapp is a 34 y.o. male.  HPI     34yo male with history of htn, hlpd, bipolar 1 disorder, schizophrenia, presents with brother with concern for odd behavior, being off of his medications.  Patient agitated, reported to ED staff that he "needs a ride to [facility] to hurt Dr. Sandria Manly."  He is unable to participate in history, has flight of ideas, rambling when asked question.     Was refusing to go get help this time.  Family was hoping he would get better but continued to be pyschotic. Not taking medications for the last 2 months.  Not eating.  2-3 weeks ago got a phone call he was in jail, had broken into car, behaviors he does not normally do when he is stable.   Agitated a lot. Is in psychotic state, they are worried about his safety.    Past Medical History:  Diagnosis Date  . Back pain   . Bipolar 1 disorder (HCC)   . Hyperlipidemia   . Hypertension    pt has been prescribed HCTZ 12.5 mg daily. Pt was 140/80 on admission.   . Schizophrenia [facility])     Patient Active Problem List   Diagnosis Date Noted  . Paranoid schizophrenia (HCC) 12/21/2018  . Preventative health care 09/29/2016  . HTN (hypertension) 02/18/2016  . Hyperlipidemia 02/18/2016  . Fatigue 02/18/2016  . Low back pain 02/18/2016  . Abdominal pain 11/27/2014  . Schizophrenia, paranoid type (HCC) 09/17/2012    Class: Acute  . Cannabis abuse 09/17/2012    History reviewed. No pertinent surgical history.     Family History  Problem Relation Age of Onset  . Hypertension Father   . Hyperlipidemia Father   . Hypertension Paternal Grandmother   . Hypertension Paternal Grandfather   . Hypertension Brother        identical twin  . Psychosis Brother        "mental issues"      Social History   Tobacco  Use  . Smoking status: Current Every Day Smoker    Types: Cigarettes  . Smokeless tobacco: Never Used  . Tobacco comment: 10-15  Substance Use Topics  . Alcohol use: Yes    Alcohol/week: 0.0 standard drinks    Comment: occasionally --"I need to drink more"  . Drug use: Yes    Types: Marijuana    Comment: Pt endorsed used of marijuana    Home Medications Prior to Admission medications   Medication Sig Start Date End Date Taking? Authorizing Provider  amLODipine (NORVASC) 10 MG tablet Take 1 tablet (10 mg total) by mouth daily. 05/04/19   Sandford Craze, NP  amLODipine (NORVASC) 5 MG tablet TAKE ONE TABLET BY MOUTH DAILY 08/02/19   Sandford Craze, NP  haloperidol (HALDOL) 10 MG tablet Take 1 tablet (10 mg total) by mouth daily. (in the morning): For mood control 12/25/18   Armandina Stammer I, NP  haloperidol (HALDOL) 5 MG tablet Take 3 tablets (15 mg total) by mouth at bedtime. For mood control 12/24/18   Armandina Stammer I, NP  hydrOXYzine (ATARAX/VISTARIL) 25 MG tablet Take 1 tablet (25 mg total) by mouth 3 (three) times daily as needed for anxiety. 12/24/18   Armandina Stammer I, NP  loratadine (CLARITIN) 10 MG tablet Take 1  tablet (10 mg total) by mouth daily. (May buy from over the counter): For allergies 12/24/18   Armandina Stammer I, NP  traZODone (DESYREL) 100 MG tablet Take 2 tablets (200 mg total) by mouth at bedtime. For sleep 12/24/18   Sanjuana Kava, NP    Allergies    Geodon [ziprasidone hcl]  Review of Systems   Review of Systems  Unable to perform ROS: Psychiatric disorder  Constitutional: Negative for fever.  Respiratory: Negative for cough and shortness of breath.   Gastrointestinal: Negative for abdominal pain, diarrhea, nausea and vomiting.    Physical Exam Updated Vital Signs BP 130/90 (BP Location: Right Arm)   Pulse 78   Temp 98.2 F (36.8 C) (Oral)   Resp 16   Ht 5\' 3"  (1.6 m)   Wt 81.6 kg   SpO2 96%   BMI 31.89 kg/m   Physical Exam Vitals and nursing note  reviewed.  Constitutional:      General: He is not in acute distress.    Appearance: He is well-developed. He is not diaphoretic.  HENT:     Head: Normocephalic and atraumatic.  Eyes:     Conjunctiva/sclera: Conjunctivae normal.  Cardiovascular:     Rate and Rhythm: Normal rate and regular rhythm.  Pulmonary:     Effort: Pulmonary effort is normal. No respiratory distress.  Abdominal:     General: There is no distension.     Palpations: Abdomen is soft.     Tenderness: There is no abdominal tenderness. There is no guarding.  Musculoskeletal:     Cervical back: Normal range of motion.  Skin:    General: Skin is warm and dry.  Neurological:     Mental Status: He is alert and oriented to person, place, and time.  Psychiatric:        Mood and Affect: Affect is angry.        Speech: Speech is rapid and pressured and tangential.        Behavior: Behavior is agitated.        Thought Content: Thought content does not include homicidal (denies on my history but had reported wanting to "hurt dr. love" to other staff members) or suicidal ideation. Thought content does not include homicidal or suicidal plan.     ED Results / Procedures / Treatments   Labs (all labs ordered are listed, but only abnormal results are displayed) Labs Reviewed  CBC WITH DIFFERENTIAL/PLATELET - Abnormal; Notable for the following components:      Result Value   WBC 10.7 (*)    Lymphs Abs 5.0 (*)    All other components within normal limits  RAPID URINE DRUG SCREEN, HOSP PERFORMED - Abnormal; Notable for the following components:   Tetrahydrocannabinol POSITIVE (*)    All other components within normal limits  ACETAMINOPHEN LEVEL - Abnormal; Notable for the following components:   Acetaminophen (Tylenol), Serum <10 (*)    All other components within normal limits  RESPIRATORY PANEL BY RT PCR (FLU A&B, COVID)  URINALYSIS, ROUTINE W REFLEX MICROSCOPIC  COMPREHENSIVE METABOLIC PANEL  ETHANOL    EKG EKG  Interpretation  Date/Time:  Friday December 23 2019 18:05:49 EST Ventricular Rate:  98 PR Interval:    QRS Duration: 96 QT Interval:  332 QTC Calculation: 424 R Axis:   4 Text Interpretation: Sinus rhythm Posterior infarct, acute (LCx) Borderline ST elevation, anterolateral leads Baseline wander in lead(s) V4 No significant change since last tracing Confirmed by 02-19-2005 (Alvira Monday) on 12/23/2019  6:40:20 PM   Radiology No results found.  Procedures Procedures (including critical care time)  Medications Ordered in ED Medications  acetaminophen (TYLENOL) tablet 650 mg (has no administration in time range)  risperiDONE (RISPERDAL M-TABS) disintegrating tablet 2 mg (has no administration in time range)    And  LORazepam (ATIVAN) tablet 1 mg (has no administration in time range)  haloperidol lactate (HALDOL) injection 5 mg (5 mg Intramuscular Given 12/23/19 1707)  LORazepam (ATIVAN) injection 2 mg (2 mg Intramuscular Given 12/23/19 1708)    ED Course  I have reviewed the triage vital signs and the nursing notes.  Pertinent labs & imaging results that were available during my care of the patient were reviewed by me and considered in my medical decision making (see chart for details).    MDM Rules/Calculators/A&P                      34yo male with history of htn, hlpd, bipolar 1 disorder, schizophrenia, presents with brother with concern for odd behavior, being off of his medications.  Patient agitated, reported to ED staff that he "needs a ride to [facility] to hurt Dr. Erling Cruz."  He is unable to participate in history, has flight of ideas, rambling when asked question.  No medical concerns, labs without significant findings. Pt medically cleared. Given ativan/haldol for agitation on arrival.  IVCd for making threatening statements, acute psychosis.  TTS consult pending, anticipate need for inpt admission.    Final Clinical Impression(s) / ED Diagnoses Final diagnoses:  Paranoid  schizophrenia ([place])  Involuntary commitment  Agitation    Rx / DC Orders ED Discharge Orders    None       Gareth Morgan, MD 12/24/19 0008

## 2019-12-23 NOTE — ED Notes (Signed)
Patient is resting comfortably. 

## 2019-12-23 NOTE — ED Notes (Signed)
Cooperative, with nurse getting IM medicine. He states," I haven't had my Seroquel in a while" In process of giving clothes and belongings to brother

## 2019-12-23 NOTE — ED Notes (Addendum)
Attempted TTS assessment. Per Jeanette Caprice, RN, patient given ativan and is drowsy. Patient unable to participate in TTS assessment at this time. Will call TTS when patient is ready.

## 2019-12-24 MED ORDER — LORAZEPAM 2 MG/ML IJ SOLN
2.0000 mg | Freq: Once | INTRAMUSCULAR | Status: AC
  Administered 2019-12-24: 2 mg via INTRAMUSCULAR

## 2019-12-24 MED ORDER — AMLODIPINE BESYLATE 5 MG PO TABS
10.0000 mg | ORAL_TABLET | Freq: Every day | ORAL | Status: DC
  Administered 2019-12-24: 10 mg via ORAL
  Filled 2019-12-24: qty 2

## 2019-12-24 MED ORDER — HYDROXYZINE HCL 25 MG PO TABS
25.0000 mg | ORAL_TABLET | Freq: Three times a day (TID) | ORAL | Status: DC | PRN
  Administered 2019-12-24: 10:00:00 25 mg via ORAL
  Filled 2019-12-24: qty 1

## 2019-12-24 MED ORDER — HALOPERIDOL 5 MG PO TABS
10.0000 mg | ORAL_TABLET | Freq: Every day | ORAL | Status: DC
  Administered 2019-12-24: 10:00:00 10 mg via ORAL
  Filled 2019-12-24: qty 2

## 2019-12-24 MED ORDER — LORAZEPAM 2 MG/ML IJ SOLN
INTRAMUSCULAR | Status: AC
  Filled 2019-12-24: qty 1

## 2019-12-24 MED ORDER — NICOTINE 21 MG/24HR TD PT24
21.0000 mg | MEDICATED_PATCH | Freq: Once | TRANSDERMAL | Status: DC
  Filled 2019-12-24: qty 1

## 2019-12-24 NOTE — ED Notes (Signed)
Called [employer]

## 2019-12-24 NOTE — ED Notes (Addendum)
Called [employer] to pick up pt meds. Spoke to [employer] @[phone] 

## 2019-12-24 NOTE — ED Notes (Signed)
Report called to receiving RN at [facility], spoke to [place], pt going to [place] [facility] unit.

## 2019-12-24 NOTE — ED Notes (Signed)
BH called and is working on in patient placement for pt.

## 2019-12-24 NOTE — ED Notes (Signed)
Contacted [place] [employer] for pt escort  @[phone] 

## 2019-12-24 NOTE — ED Notes (Signed)
Spoke with brother Ragan Reale and given update regarding transfer to [facility].

## 2019-12-24 NOTE — ED Notes (Signed)
Pt slightly restless in room.  Watching TV, cooperative.

## 2019-12-24 NOTE — Progress Notes (Signed)
Patient meets criteria for inpatient treatment per Nira Conn, FNP. No appropriate beds at [facility] currently. CSW faxed referrals to the following facilities for review: [facility]   [facility]   [facility]   [facility]   [facility]   [facility]   [facility]   [facility]   [facility]   [facility]      TTS will continue to seek bed placement.     Ruthann Cancer MSW, [facility] Clin[facility] Social Worker Disposition  [facility] Ph: [phone] Fax: [phone] 12/24/2019 9:32 AM

## 2019-12-24 NOTE — ED Notes (Signed)
Assumed care at this time

## 2019-12-24 NOTE — ED Notes (Signed)
Pt becoming more agitated. Called BH and they initiated tele-psych assessment.

## 2019-12-24 NOTE — BH Assessment (Addendum)
Tele Assessment Note   Patient Name: Lucas Knapp MRN: [id number] Referring Physician: Dr. Alvira Knapp Location of Patient: [facility] Location of Provider: Behavioral Health TTS Department  Lucas Knapp is an 34 y.o. male with history of bipolar 1 disorder and schizophrenia. Patient was accompanied by brother upon arrival to the ED with concerns of odd behaviors and not taking his medications. Patient asking, "need a ride to [facility] to hurt Dr. Sandria Knapp". Per family, patient has been off medications for the past 2 months, he is not eating. Family reported 2-3 weeks ago got a phone call patient was in jail, he had broken into a car. Family reported that this is not baseline and not normal behaviors for client.   When asked if patient has been taking medication, patient stated "no habla english". Patient then stated "its sulfur running through my head, its too much". When asked if he has a psychiatrist and a therapist, patient stated "I don't talk to idiots". When asked about inpatient mental health treatment, patient stated "new insurance plan". Patient instructed clinician to "call up and ask Lucas Knapp". Patient told clinician  "no es stupido talk". Patient referred clinician "go talk to Lucas Knapp", patient got upset when clinician asked about this person. Patient upset at hospital staff giving him shots. Clinician encouraged patient to be calm and talk to his nurse. Patient is unable to participate in history, has flight of ideas, rambling when asked question. During assessment patient continued to get louder and more aggressive. Patient refusing to answer many questions, therefore clinician unable to assess.   PER IVC: Patient presented with brother who reports he has been off of his schizophrenia medications with abnormal behaviors. He reported to emergency staff that he "needed a ride to [facility] to hurt Dr. Sandria Knapp". He has flight of ideas.   Diagnosis: Schizophrenia  Past Medical  History:  Past Medical History:  Diagnosis Date  . Back pain   . Bipolar 1 disorder (HCC)   . Hyperlipidemia   . Hypertension    pt has been prescribed HCTZ 12.5 mg daily. Pt was 140/80 on admission.   . Schizophrenia (HCC)     History reviewed. No pertinent surgical history.  Family History:  Family History  Problem Relation Age of Onset  . Hypertension Father   . Hyperlipidemia Father   . Hypertension Paternal Grandmother   . Hypertension Paternal Grandfather   . Hypertension Brother        identical twin  . Psychosis Brother        "mental issues"      Social History:  reports that he has been smoking cigarettes. He has never used smokeless tobacco. He reports current alcohol use. He reports current drug use. Drug: Marijuana.  Additional Social History:  Alcohol / Drug Use Pain Medications: see MAR Prescriptions: see MAR Over the Counter: see MAR  CIWA: CIWA-Ar BP: (!) 148/98 Pulse Rate: 98 COWS:    Allergies:  Allergies  Allergen Reactions  . Geodon [Ziprasidone Hcl] Other (See Comments)    Reaction:  Made pts throat dry     Home Medications: (Not in a hospital admission)   OB/GYN Status:  No LMP for male patient.  General Assessment Data Assessment unable to be completed: Yes Reason for not completing assessment: (patient medicated) Location of Assessment: [facility] TTS Assessment: In system Is this a Tele or Face-to-Face Assessment?: Tele Assessment Is this an Initial Assessment or a Re-assessment for this encounter?: Initial Assessment Patient  Accompanied by:: N/A Language Other than English: No Living Arrangements: Homeless/Shelter What gender do you identify as?: Male Marital status: Single Living Arrangements: (per patient homeless) Can pt return to current living arrangement?: Yes Admission Status: Involuntary Petitioner: ED Attending Is patient capable of signing voluntary admission?: (IVC) Referral Source:  Self/Family/Friend     Crisis Care Plan Living Arrangements: (per patient homeless) Legal Guardian: Other:(self) Name of Psychiatrist: (unable to assess) Name of Therapist: (unable to assess)  Education Status Is patient currently in school?: No Is the patient employed, unemployed or receiving disability?: Receiving disability income  Risk to self with the past 6 months Suicidal Ideation: No Has patient been a risk to self within the past 6 months prior to admission? : No Suicidal Intent: No Has patient had any suicidal intent within the past 6 months prior to admission? : (unable to assess) Is patient at risk for suicide?: (unable to assess) Suicidal Plan?: No Has patient had any suicidal plan within the past 6 months prior to admission? : (unable to assess) Access to Means: (unable to assess) What has been your use of drugs/alcohol within the last 12 months?: (unable to assess) Previous Attempts/Gestures: (unable to assess) How many times?: (unknown) Other Self Harm Risks: (unable to assess) Triggers for Past Attempts: (unable to assess) Intentional Self Injurious Behavior: None Family Suicide History: Unable to assess Recent stressful life event(s): Other (Comment)(medication not working) Persecutory voices/beliefs?: No Depression: No Depression Symptoms: (denied) Substance abuse history and/or treatment for substance abuse?: No Suicide prevention information given to non-admitted patients: Not applicable  Risk to Others within the past 6 months Homicidal Ideation: No Does patient have any lifetime risk of violence toward others beyond the six months prior to admission? : (unable to assess) Thoughts of Harm to Others: (unable to assess) Current Homicidal Intent: (unable to assess) Current Homicidal Plan: (unable to assess) Access to Homicidal Means: (unable to assess) History of harm to others?: (unable to assess) Assessment of Violence: None Noted Violent Behavior  Description: (unable to assess) Does patient have access to weapons?: (unable to assess) Criminal Charges Pending?: No Does patient have a court date: Yes Court Date: (unable to assess) Is patient on probation?: Unknown  Psychosis Hallucinations: (unabl to assess) Delusions: Unspecified  Mental Status Report Appearance/Hygiene: Unremarkable Eye Contact: Good Motor Activity: Hyperactivity, Restlessness, Freedom of movement, Agitation Speech: Rapid, Pressured Level of Consciousness: Alert Mood: Irritable Affect: Irritable Anxiety Level: Moderate Thought Processes: Flight of Ideas Judgement: Impaired Orientation: Unable to assess Obsessive Compulsive Thoughts/Behaviors: Unable to Assess  Cognitive Functioning Concentration: Normal Memory: Unable to Assess Is patient IDD: No Insight: Unable to Assess Impulse Control: Poor Appetite: (unable to assess) Have you had any weight changes? : (unable to assess) Sleep: Unable to Assess Total Hours of Sleep: (unable to assess) Vegetative Symptoms: Unable to Assess  ADLScreening [facility] Assessment Services) Patient's cognitive ability adequate to safely complete daily activities?: Yes Patient able to express need for assistance with ADLs?: Yes Independently performs ADLs?: Yes (appropriate for developmental age)  Prior Inpatient Therapy Prior Inpatient Therapy: Yes Prior Therapy Dates: (2019) Prior Therapy Facilty/Provider(s): ([facility]) Reason for Treatment: (mental illness)  Prior Outpatient Therapy Prior Outpatient Therapy: Yes Prior Therapy Dates: (unknown) Prior Therapy Facilty/Provider(s): (unknown) Reason for Treatment: (mental illness) Does patient have an ACCT team?: No Does patient have Intensive In-House Services?  : No Does patient have [name] services? : No Does patient have P4CC services?: No  ADL Screening (condition at time of admission) Patient's cognitive ability  adequate to safely complete daily  activities?: Yes Patient able to express need for assistance with ADLs?: Yes Independently performs ADLs?: Yes (appropriate for developmental age)  Advance Directives (For Healthcare) Does Patient Have a Medical Advance Directive?: No Would patient like information on creating a medical advance directive?: No - Patient declined   Disposition:  Disposition Initial Assessment Completed for this Encounter: Yes  Lindon Romp, NP, patient meets inpatient criteria. Lucas, AC, no inpatient beds. TTS to secure placement elsewhere. Lambert Keto, RN, informed of disposition.  This service was provided via telemedicine using a 2-way, interactive audio and video technology.  Names of all persons participating in this telemedicine service and their role in this encounter. Name: Mahmoud Blazejewski Role: Patient  Name: Kirtland Bouchard Role: TTS Clinician  Name:  Role:   Name:  Role:     Venora Maples 12/24/2019 5:51 AM

## 2019-12-24 NOTE — ED Notes (Signed)
Call to [facility], spoke to [place] to update on condition and needs for possible placement.

## 2019-12-24 NOTE — ED Notes (Signed)
Nira Conn, NP, patient meets inpatient criteria. Joann, AC, no inpatient beds. TTS to secure placement elsewhere. Caryl Asp, RN, informed of disposition.

## 2019-12-24 NOTE — ED Notes (Signed)
Pt demanding a Clinical research associate. Advised pt that was not available at this time. Pt becoming more agitated and unable to redirect behavior. Pt speech becoming louder and more aggressive. MD aware.

## 2019-12-24 NOTE — Progress Notes (Signed)
Pt accepted to [facility] Dr. [name] is the accepting provider.  Call report to [phone] Seward Grater, RN @  [facility] notified.   Pt is IVC   Pt may be transported by [employer] Pt scheduled to arrive at the [facility] C unit once IVC paperwork has been faxed to [phone]    Ruthann Cancer MSW, [facility] Clincal Social Worker Disposition  [facility] Ph: [phone] Fax: [phone]  12/24/2019 10:07 AM

## 2020-02-07 IMAGING — DX PORTABLE CHEST - 1 VIEW
1 series · 1 of 1 positions shown · non-contrast
Comparison: November 26, 2014

CLINICAL DATA: Chest pain.

EXAM:
PORTABLE CHEST 1 VIEW

[chest ap]
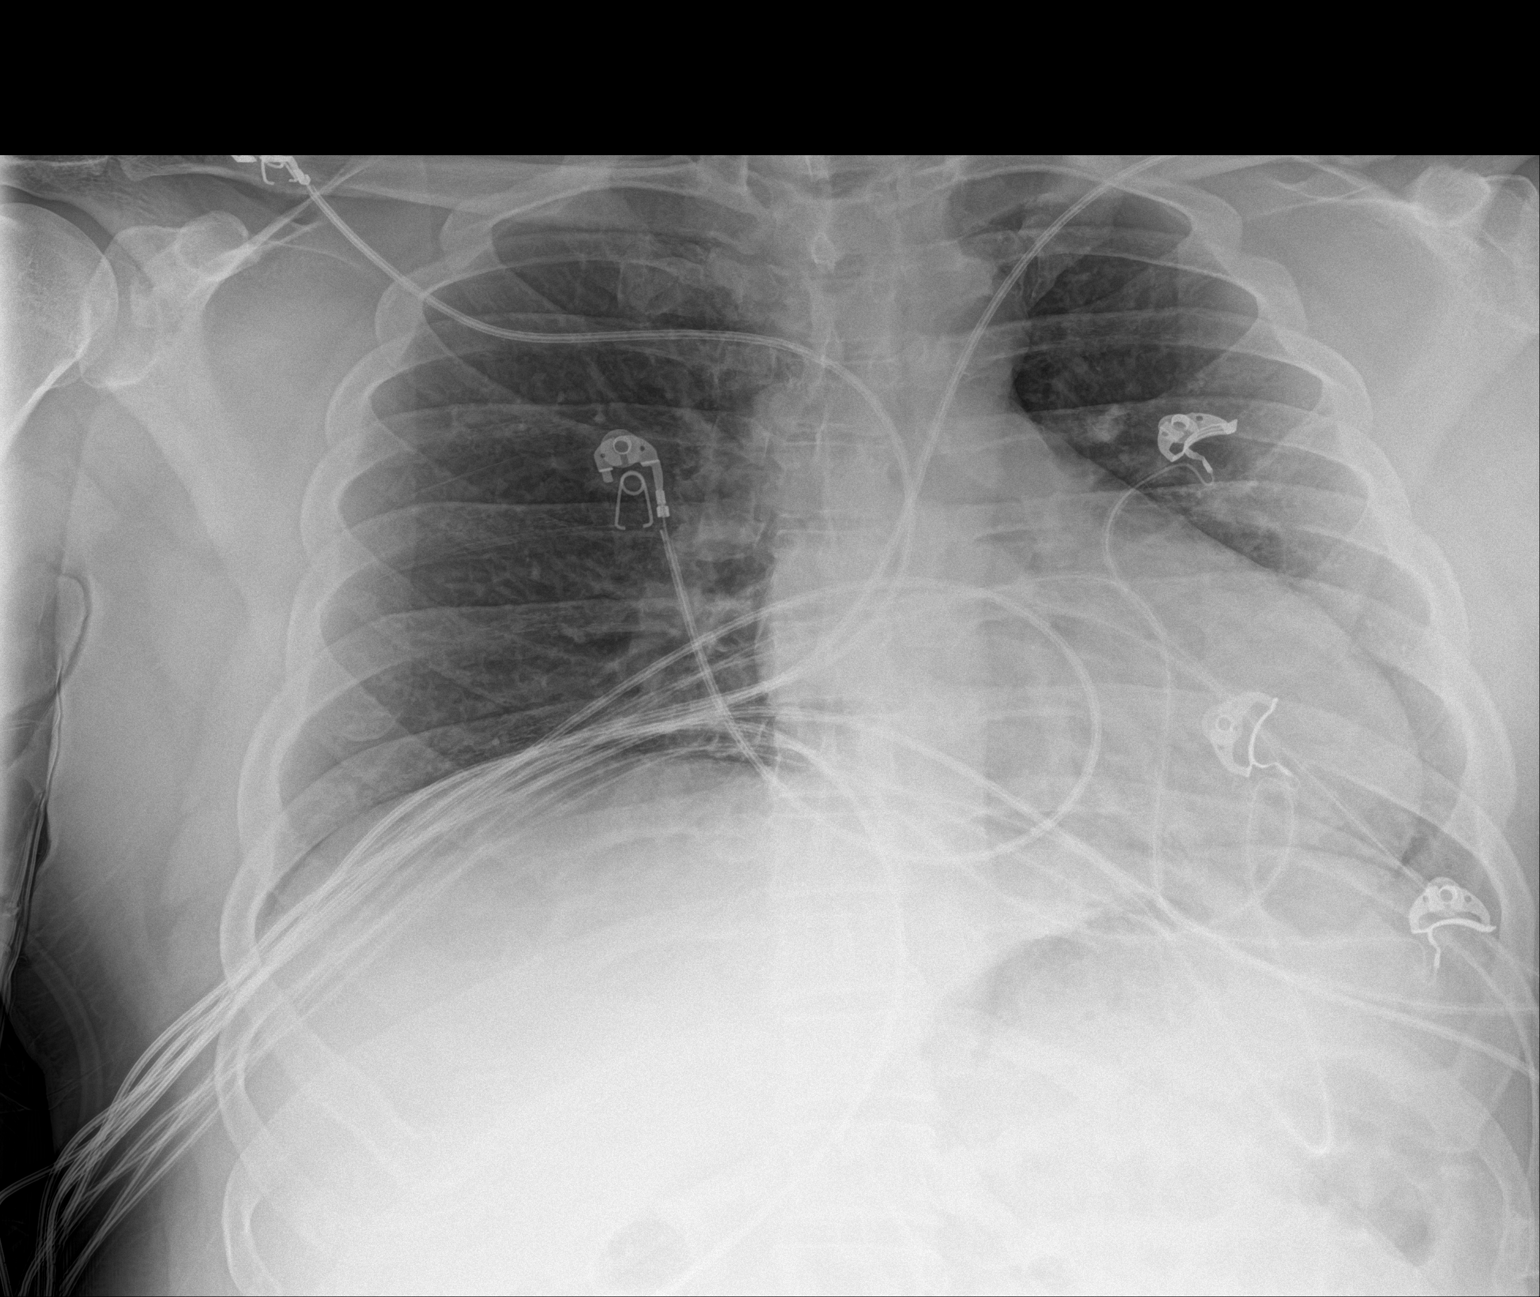

[1 of 1 positions shown; findings below may reference images not displayed]

FINDINGS: Mild atelectasis in the left mid lung. The heart, hila, mediastinum,
lungs, and pleura are otherwise normal.
IMPRESSION: No active disease.

## 2020-06-20 ENCOUNTER — Encounter (HOSPITAL_COMMUNITY): Payer: Self-pay | Admitting: Psychiatry

## 2020-06-20 ENCOUNTER — Ambulatory Visit (INDEPENDENT_AMBULATORY_CARE_PROVIDER_SITE_OTHER): Payer: Medicare (Managed Care) | Admitting: Psychiatry

## 2020-06-20 ENCOUNTER — Other Ambulatory Visit: Payer: Self-pay

## 2020-06-20 DIAGNOSIS — F200 Paranoid schizophrenia: Secondary | ICD-10-CM

## 2020-06-20 MED ORDER — HALOPERIDOL 10 MG PO TABS: 10.0000 mg | ORAL_TABLET | Freq: Every day | ORAL | 2 refills | Status: DC

## 2020-06-20 MED ORDER — QUETIAPINE FUMARATE 25 MG PO TABS: 25.0000 mg | ORAL_TABLET | Freq: Every day | ORAL | 2 refills | Status: DC

## 2020-06-20 MED ORDER — HYDROXYZINE HCL 25 MG PO TABS: 25.0000 mg | ORAL_TABLET | Freq: Three times a day (TID) | ORAL | 2 refills | Status: DC | PRN

## 2020-06-20 MED ORDER — BENZTROPINE MESYLATE 0.5 MG PO TABS: 0.5000 mg | ORAL_TABLET | Freq: Two times a day (BID) | ORAL | 2 refills | Status: DC

## 2020-06-20 MED ORDER — HALOPERIDOL 5 MG PO TABS: 15.0000 mg | ORAL_TABLET | Freq: Every day | ORAL | 2 refills | Status: DC
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
## 2020-08-20 ENCOUNTER — Encounter (HOSPITAL_COMMUNITY): Payer: Medicaid Other | Admitting: Psychiatry

## 2020-08-30 ENCOUNTER — Encounter (HOSPITAL_COMMUNITY): Payer: Medicaid Other | Admitting: Psychiatry

## 2020-09-06 ENCOUNTER — Other Ambulatory Visit (HOSPITAL_COMMUNITY): Payer: Self-pay | Admitting: Psychiatry

## 2020-09-06 DIAGNOSIS — F200 Paranoid schizophrenia: Secondary | ICD-10-CM

## 2020-10-31 ENCOUNTER — Other Ambulatory Visit (HOSPITAL_COMMUNITY): Payer: Self-pay | Admitting: Psychiatry

## 2020-10-31 DIAGNOSIS — F200 Paranoid schizophrenia: Secondary | ICD-10-CM

## 2020-11-05 ENCOUNTER — Telehealth (HOSPITAL_COMMUNITY): Payer: Self-pay | Admitting: *Deleted

## 2020-11-05 NOTE — Telephone Encounter (Signed)
VM left fri pm for Clinical research associate requesting a note for court. Unable to understand his info on VM and out of the office on Fri pm so called him this am. He has not been seen since 6/21 and no follow up appts currently scheduled. He looks to have no showed for the last scheduled appt. He is requesting a letter to the court explaining what medicines he is on and what they are for. Record indicates he got a Rx for Seroquel in Nov. No other medicines are current. Will bring this to Toy Cookey NP's attention and will call him when letter is available to him.

## 2020-11-07 ENCOUNTER — Encounter (HOSPITAL_COMMUNITY): Payer: Self-pay | Admitting: Psychiatry

## 2020-11-07 NOTE — Telephone Encounter (Signed)
Provider contacted patient and informed him that a letter would be written. Provider informed patient that she could only give information that was pertaining to his visit on 06/20/2020. He was instructed to follow up with [facility] if he needed further assistance. He endorsed understanding and agreed.

## 2020-11-09 ENCOUNTER — Other Ambulatory Visit: Payer: Self-pay

## 2020-11-09 ENCOUNTER — Encounter (HOSPITAL_COMMUNITY): Payer: Self-pay | Admitting: Psychiatry

## 2020-11-09 ENCOUNTER — Telehealth (INDEPENDENT_AMBULATORY_CARE_PROVIDER_SITE_OTHER): Payer: Medicare (Managed Care) | Admitting: Psychiatry

## 2020-11-09 DIAGNOSIS — F200 Paranoid schizophrenia: Secondary | ICD-10-CM

## 2020-11-09 MED ORDER — HALOPERIDOL 10 MG PO TABS: 15.0000 mg | ORAL_TABLET | Freq: Every day | ORAL | 2 refills | Status: DC

## 2020-11-09 MED ORDER — BENZTROPINE MESYLATE 0.5 MG PO TABS: 0.5000 mg | ORAL_TABLET | Freq: Two times a day (BID) | ORAL | 2 refills | Status: DC

## 2020-11-09 NOTE — Progress Notes (Signed)
BH MD/PA/NP OP Progress Note Virtual Visit via Video Note  I connected with Lucas Knapp on 11/09/20 at  9:30 AM EST by a video enabled telemedicine application and verified that I am speaking with the correct person using two identifiers.  Location: Patient: Home Provider: Clinic   I discussed the limitations of evaluation and management by telemedicine and the availability of in person appointments. The patient expressed understanding and agreed to proceed.  I provided 30 minutes of non-face-to-face time during this encounter.    11/09/2020 9:23 AM Lucas Knapp  MRN:  [id number]  Chief Complaint: "I stopped the Seroquel and hydroxyzine"  HPI: 34 year old male seen today for follow up psychiatric evaluation. He has a psychiatric history of paranoid schizophrenia, bipolar disorder, schizoaffective disorder, and cannabis use disorder. He is currently managed on Haldol 10 mg daily, Haldol 15 mg HS, hydroxyzine 25 mg three times daily, cogentin 0.5 mg BID, and Seroquel 25 mg nightly. Today patient reported that he discontinued his Seroquel and hydroxyzine and is now taking Haldol 15 mg twice daily which he noted is effective in managing his psychiatric conditions.   Today he is pleasant, cooperative, engaged in conversation, and maintained eye contact. He informed Clinical research associate that he is doing well on his current medication regimen and denies depression and anxiety. He reported that discontinuing his his Seroquel and hydroxyzine was effective in reducting his daytime sedation while at work.  Today he denies symptoms of mania, SI/HI/VAH or paranoia.    Patient informed provider that his court case in July was continued into December. He noted that he is hopeful that the case will be dismissed.  He also informed provider that he continues to work at the [employer] and noted that he enjoying his work but reports that it is busy due to the holiday.  No medication changes made today.  Patient is agreeable to continue all medications as prescribed.   Visit Diagnosis:    ICD-10-CM   1. Paranoid schizophrenia (HCC)  F20.0 haloperidol (HALDOL) 10 MG tablet    haloperidol (HALDOL) 10 MG tablet    benztropine (COGENTIN) 0.5 MG tablet    Past Psychiatric History: paranoid schizophrenia, bipolar disorder, schizoaffective disorder, and cannabis use disorder.  Past Medical History:  Past Medical History:  Diagnosis Date  . Back pain   . Bipolar 1 disorder (HCC)   . Hyperlipidemia   . Hypertension    pt has been prescribed HCTZ 12.5 mg daily. Pt was 140/80 on admission.   . Schizophrenia (HCC)    History reviewed. No pertinent surgical history.  Family Psychiatric History: Cousin ADHD, Twin Brother history of schizophrenia and schizoaffective bipolar type  Family History:  Family History  Problem Relation Age of Onset  . Hypertension Father   . Hyperlipidemia Father   . Hypertension Paternal Grandmother   . Hypertension Paternal Grandfather   . Hypertension Brother        identical twin  . Psychosis Brother        "mental issues"      Social History:  Social History   Socioeconomic History  . Marital status: Single    Spouse name: Not on file  . Number of children: Not on file  . Years of education: Not on file  . Highest education level: Not on file  Occupational History  . Not on file  Tobacco Use  . Smoking status: Current Every Day Smoker    Types: Cigarettes  . Smokeless tobacco: Never Used  .  Tobacco comment: 10-15  Vaping Use  . Vaping Use: Never assessed  Substance and Sexual Activity  . Alcohol use: Yes    Alcohol/week: 0.0 standard drinks    Comment: occasionally --"I need to drink more"  . Drug use: Yes    Types: Marijuana    Comment: Pt endorsed used of marijuana  . Sexual activity: Not Currently  Other Topics Concern  . 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    Single   No children   Completed college (bachelors in Statistician)   Enjoys swimming, playing pool, walking   G[place] up [place].  Has identical twin and 2 other brothers in [place]   Social Determinants of Health   Financial Resource Strain:   . Difficulty of Paying Living Expenses: Not on file  Food Insecurity:   . Worried About Programme researcher, broadcasting/film/video in the Last Year: Not on file  . Ran Out of Food in the Last Year: Not on file  Transportation Needs:   . Lack of Transportation (Medical): Not on file  . Lack of Transportation (Non-Medical): Not on file  Physical Activity:   . Days of Exercise per Week: Not on file  . Minutes of Exercise per Session: Not on file  Stress:   . Feeling of Stress : Not on file  Social Connections:   . Frequency of Communication with Friends and Family: Not on file  . Frequency of Social Gatherings with Friends and Family: Not on file  . Attends Religious Services: Not on file  . Active Member of Clubs or Organizations: Not on file  . Attends Banker Meetings: Not on file  . Marital Status: Not on file    Allergies:  Allergies  Allergen Reactions  . Geodon [Ziprasidone Hcl] Other (See Comments)    Reaction:  Made pts throat dry     Metabolic Disorder Labs: Lab Results  Component Value Date   HGBA1C 5.0 12/22/2018   MPG 96.8 12/22/2018   MPG 99.67 07/29/2018   Lab Results  Component Value Date   PROLACTIN 30.0 (H) 07/29/2018   Lab Results  Component Value Date   CHOL 167 12/22/2018   TRIG 90 12/22/2018   HDL 37 (L) 12/22/2018   CHOLHDL 4.5 12/22/2018   VLDL 18 12/22/2018   LDLCALC 112 (H) 12/22/2018   LDLCALC 150 (H) 09/29/2016   Lab Results  Component Value Date   TSH 1.609 12/22/2018   TSH 2.952 07/29/2018    Therapeutic Level Labs: No results found for: LITHIUM No results found for: VALPROATE No components found for:  CBMZ  Current Medications: Current Outpatient  Medications  Medication Sig Dispense Refill  . amLODipine (NORVASC) 10 MG tablet Take 1 tablet (10 mg total) by mouth daily. 90 tablet 1  . amLODipine (NORVASC) 5 MG tablet TAKE ONE TABLET BY MOUTH DAILY 30 tablet 2  . benztropine (COGENTIN) 0.5 MG tablet Take 1 tablet (0.5 mg total) by mouth 2 (two) times daily. 60 tablet 2  . haloperidol (HALDOL) 10 MG tablet Take 1.5 tablets (15 mg total) by mouth daily. (in the morning): For mood control 45 tablet 2  . haloperidol (HALDOL) 10 MG tablet Take 1.5 tablets (15 mg total) by mouth at bedtime. For mood control 45 tablet 2  . loratadine (CLARITIN) 10 MG tablet Take 1 tablet (10 mg  total) by mouth daily. (May buy from over the counter): For allergies 30 tablet 11   No current facility-administered medications for this visit.     Musculoskeletal: Strength & Muscle Tone: Unable to assess due to telehealth visit Gait & Station: Unable to assess due to telehealth visit Patient leans: N/A  Psychiatric Specialty Exam: Review of Systems  There were no vitals taken for this visit.There is no height or weight on file to calculate BMI.  General Appearance: Well Groomed  Eye Contact:  Good  Speech:  Blocked and Normal Rate  Volume:  Normal  Mood:  Euthymic  Affect:  Appropriate and Congruent  Thought Process:  Coherent, Goal Directed and Linear  Orientation:  Full (Time, Place, and Person)  Thought Content: WDL and Logical   Suicidal Thoughts:  No  Homicidal Thoughts:  No  Memory:  Immediate;   Good Recent;   Good Remote;   Good  Judgement:  Good  Insight:  Good  Psychomotor Activity:  Normal  Concentration:  Concentration: Good and Attention Span: Good  Recall:  Good  Fund of Knowledge: Good  Language: Good  Akathisia:  No  Handed:  Right  AIMS (if indicated):Not done  Assets:  Communication Skills Desire for Improvement Financial Resources/Insurance Housing Social Support  ADL's:  Intact  Cognition: WNL  Sleep:  Good    Screenings: AIMS     [facility]  AIMS Total Score 0 0    AUDIT     [facility]) [facility]  Alcohol Use Disorder Identification Test Final Score (AUDIT) 0 0    PHQ2-9     Office Visit from 06/28/2018 in [facility] at [facility]  PHQ-2 Total Score 6  PHQ-9 Total Score 27       Assessment and Plan: Patient reported that he is doing well on his current medication regimen. No medication changes made. Patient agreeable to continue all medications as prescribed.   1. Paranoid schizophrenia (HCC)  Continue- haloperidol (HALDOL) 10 MG tablet; Take 1.5 tablets (15 mg total) by mouth daily. (in the morning): For mood control  Dispense: 45 tablet; Refill: 2 Continue/Patient increased- haloperidol (HALDOL) 10 MG tablet; Take 1.5 tablets (15 mg total) by mouth at bedtime. For mood control  Dispense: 45 tablet; Refill: 2 Continue- benztropine (COGENTIN) 0.5 MG tablet; Take 1 tablet (0.5 mg total) by mouth 2 (two) times daily.  Dispense: 60 tablet; Refill: 2  Follow up in 3 months   Shanna Cisco, NP 11/09/2020, 9:23 AM

## 2020-11-29 ENCOUNTER — Other Ambulatory Visit (HOSPITAL_COMMUNITY): Payer: Self-pay | Admitting: Psychiatry

## 2020-11-29 DIAGNOSIS — F200 Paranoid schizophrenia: Secondary | ICD-10-CM

## 2021-01-04 DIAGNOSIS — Z1152 Encounter for screening for COVID-19: Secondary | ICD-10-CM | POA: Diagnosis not present

## 2021-02-04 ENCOUNTER — Encounter (HOSPITAL_COMMUNITY): Payer: Self-pay | Admitting: Psychiatry

## 2021-02-04 ENCOUNTER — Other Ambulatory Visit: Payer: Self-pay

## 2021-02-04 ENCOUNTER — Telehealth (INDEPENDENT_AMBULATORY_CARE_PROVIDER_SITE_OTHER): Payer: Medicare Other | Admitting: Psychiatry

## 2021-02-04 DIAGNOSIS — F200 Paranoid schizophrenia: Secondary | ICD-10-CM | POA: Diagnosis not present

## 2021-02-04 MED ORDER — BENZTROPINE MESYLATE 0.5 MG PO TABS: 0.5000 mg | ORAL_TABLET | Freq: Two times a day (BID) | ORAL | 2 refills | Status: DC

## 2021-02-04 MED ORDER — HALOPERIDOL 10 MG PO TABS: 15.0000 mg | ORAL_TABLET | Freq: Two times a day (BID) | ORAL | 2 refills | Status: DC

## 2021-02-04 NOTE — Progress Notes (Signed)
BH MD/PA/NP OP Progress Note Virtual Visit via Video Note  I connected with Lucas Knapp on 02/04/21 at 11:00 AM EST by a video enabled telemedicine application and verified that I am speaking with the correct person using two identifiers.  Location: Patient: Home Provider: Clinic   I discussed the limitations of evaluation and management by telemedicine and the availability of in person appointments. The patient expressed understanding and agreed to proceed.  I provided 30 minutes of non-face-to-face time during this encounter.    02/04/2021 11:26 AM Lucas Knapp  MRN:  [id number]  Chief Complaint: "Things are going well"  HPI: 35 year old male seen today for follow up psychiatric evaluation. He has a psychiatric history of paranoid schizophrenia, bipolar disorder, schizoaffective disorder, and cannabis use disorder. He is currently managed on Haldol 15 mg twice daily and cogentin 0.5 mg BID. Today patient reported that his medications are effective in managing his psychiatric conditions.   Today he is pleasant, cooperative, engaged in conversation, and maintained eye contact. He informed Clinical research associate that he is doing well on his current medication regimen.  He notes that he has very minimal anxiety and depression.  Provider conducted a GAD-7 and patient scored a 2.  Provider also conducted a PHQ-9 and patient scored a 0.  He notes that at times he has pain in his mid back however he notes that he is following up with his primary care doctor to establish medication regimen to treat his pain.   Today he denies symptoms of mania, SI/HI/VAH or paranoia.   Notes that he continues to work at [employer].  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  He notes that his father is an Personnel officer and needs help.  No medication changes made today. Patient is agreeable to continue all medications as prescribed.  No other concerns  noted at this time.   Visit Diagnosis:    ICD-10-CM   1. Paranoid schizophrenia (HCC)  F20.0 benztropine (COGENTIN) 0.5 MG tablet    haloperidol (HALDOL) 10 MG tablet    Past Psychiatric History: paranoid schizophrenia, bipolar disorder, schizoaffective disorder, and cannabis use disorder.  Past Medical History:  Past Medical History:  Diagnosis Date  . Back pain   . Bipolar 1 disorder (HCC)   . Hyperlipidemia   . Hypertension    pt has been prescribed HCTZ 12.5 mg daily. Pt was 140/80 on admission.   . Schizophrenia (HCC)    History reviewed. No pertinent surgical history.  Family Psychiatric History: Cousin ADHD, Twin Brother history of schizophrenia and schizoaffective bipolar type  Family History:  Family History  Problem Relation Age of Onset  . Hypertension Father   . Hyperlipidemia Father   . Hypertension Paternal Grandmother   . Hypertension Paternal Grandfather   . Hypertension Brother        identical twin  . Psychosis Brother        "mental issues"      Social History:  Social History   Socioeconomic History  . Marital status: Single    Spouse name: Not on file  . Number of children: Not on file  . Years of education: Not on file  . Highest education level: Not on file  Occupational History  . Not on file  Tobacco Use  . Smoking status: Current Every Day Smoker    Types: Cigarettes  . Smokeless tobacco: Never Used  . Tobacco comment: 10-15  Vaping Use  .  Vaping Use: Not on file  Substance and Sexual Activity  . Alcohol use: Yes    Alcohol/week: 0.0 standard drinks    Comment: occasionally --"I need to drink more"  . Drug use: Yes    Types: Marijuana    Comment: Pt endorsed used of marijuana  . Sexual activity: Not Currently  Other Topics Concern  . 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    Single   No children   Completed college (bachelors in  Statistician)   Enjoys swimming, playing pool, walking   Grew up [place].  Has identical twin and 2 other brothers in [place]   Social Determinants of Health   Financial Resource Strain: Not on file  Food Insecurity: Not on file  Transportation Needs: Not on file  Physical Activity: Not on file  Stress: Not on file  Social Connections: Not on file    Allergies:  Allergies  Allergen Reactions  . Geodon [Ziprasidone Hcl] Other (See Comments)    Reaction:  Made pts throat dry     Metabolic Disorder Labs: Lab Results  Component Value Date   HGBA1C 5.0 12/22/2018   MPG 96.8 12/22/2018   MPG 99.67 07/29/2018   Lab Results  Component Value Date   PROLACTIN 30.0 (H) 07/29/2018   Lab Results  Component Value Date   CHOL 167 12/22/2018   TRIG 90 12/22/2018   HDL 37 (L) 12/22/2018   CHOLHDL 4.5 12/22/2018   VLDL 18 12/22/2018   LDLCALC 112 (H) 12/22/2018   LDLCALC 150 (H) 09/29/2016   Lab Results  Component Value Date   TSH 1.609 12/22/2018   TSH 2.952 07/29/2018    Therapeutic Level Labs: No results found for: LITHIUM No results found for: VALPROATE No components found for:  CBMZ  Current Medications: Current Outpatient Medications  Medication Sig Dispense Refill  . amLODipine (NORVASC) 10 MG tablet Take 1 tablet (10 mg total) by mouth daily. 90 tablet 1  . amLODipine (NORVASC) 5 MG tablet TAKE ONE TABLET BY MOUTH DAILY 30 tablet 2  . benztropine (COGENTIN) 0.5 MG tablet Take 1 tablet (0.5 mg total) by mouth 2 (two) times daily. 60 tablet 2  . haloperidol (HALDOL) 10 MG tablet Take 1.5 tablets (15 mg total) by mouth 2 (two) times daily. (in the morning): For mood control 70 tablet 2  . loratadine (CLARITIN) 10 MG tablet Take 1 tablet (10 mg total) by mouth daily. (May buy from over the counter): For allergies 30 tablet 11   No current facility-administered medications for this visit.     Musculoskeletal: Strength & Muscle Tone: Unable to assess  due to telehealth visit Gait & Station: Unable to assess due to telehealth visit Patient leans: N/A  Psychiatric Specialty Exam: Review of Systems  There were no vitals taken for this visit.There is no height or weight on file to calculate BMI.  General Appearance: Well Groomed  Eye Contact:  Good  Speech:  Blocked and Normal Rate  Volume:  Normal  Mood:  Euthymic  Affect:  Appropriate and Congruent  Thought Process:  Coherent, Goal Directed and Linear  Orientation:  Full (Time, Place, and Person)  Thought Content: WDL and Logical   Suicidal Thoughts:  No  Homicidal Thoughts:  No  Memory:  Immediate;   Good Recent;   Good Remote;   Good  Judgement:  Good  Insight:  Good  Psychomotor Activity:  Normal  Concentration:  Concentration: Good and Attention Span: Good  Recall:  Good  Fund of Knowledge: Good  Language: Good  Akathisia:  No  Handed:  Right  AIMS (if indicated):Not done  Assets:  Communication Skills Desire for Improvement Financial Resources/Insurance Housing Social Support  ADL's:  Intact  Cognition: WNL  Sleep:  Good   Screenings: AIMS   Flowsheet Row [facility]) [facility]  AIMS Total Score 0 0    AUDIT   Flowsheet Row [facility]) [facility]  Alcohol Use Disorder Identification Test Final Score (AUDIT) 0 0    GAD-7   Flowsheet Row Video Visit from 02/04/2021 in [facility]  Total GAD-7 Score 2    PHQ2-9   Flowsheet Row Video Visit from 02/04/2021 in [facility] Office Visit from 06/28/2018 in [facility]  PHQ-2 Total Score 0 6  PHQ-9 Total Score 0 27     Flowsheet Row Video Visit from 02/04/2021 in [facility] ED from 12/23/2019 in [facility] Ad[facility]  C-SSRS RISK CATEGORY No Risk No Risk No Risk       Assessment and Plan: Patient reported that he is doing well on his current medication regimen. No medication changes made. Patient agreeable to continue all medications as prescribed.   1. Paranoid schizophrenia (HCC)  Continue- benztropine (COGENTIN) 0.5 MG tablet; Take 1 tablet (0.5 mg total) by mouth 2 (two) times daily.  Dispense: 60 tablet; Refill: 2 Continue- haloperidol (HALDOL) 10 MG tablet; Take 1.5 tablets (15 mg total) by mouth 2 (two) times daily. (in the morning): For mood control  Dispense: 70 tablet; Refill: 2   Follow up in 3 months   Shanna Cisco, NP 02/04/2021, 11:26 AM

## 2021-04-16 ENCOUNTER — Other Ambulatory Visit (HOSPITAL_COMMUNITY): Payer: Self-pay | Admitting: Psychiatry

## 2021-04-16 DIAGNOSIS — F200 Paranoid schizophrenia: Secondary | ICD-10-CM

## 2021-05-02 ENCOUNTER — Other Ambulatory Visit: Payer: Self-pay

## 2021-05-02 ENCOUNTER — Encounter (HOSPITAL_COMMUNITY): Payer: Self-pay | Admitting: Psychiatry

## 2021-05-02 ENCOUNTER — Telehealth (INDEPENDENT_AMBULATORY_CARE_PROVIDER_SITE_OTHER): Payer: Medicare HMO | Admitting: Psychiatry

## 2021-05-02 DIAGNOSIS — F200 Paranoid schizophrenia: Secondary | ICD-10-CM

## 2021-05-02 MED ORDER — HALOPERIDOL 10 MG PO TABS: ORAL_TABLET | ORAL | 2 refills | Status: DC

## 2021-05-02 MED ORDER — BENZTROPINE MESYLATE 0.5 MG PO TABS: 0.5000 mg | ORAL_TABLET | Freq: Two times a day (BID) | ORAL | 2 refills | Status: DC

## 2021-05-02 NOTE — Progress Notes (Signed)
BH MD/PA/NP OP Progress Note Virtual Visit via Telephone Note  I connected with Lucas Knapp on 05/02/21 at 11:00 AM EDT by telephone and verified that I am speaking with the correct person using two identifiers.  Location: Patient: home Provider: Clinic   I discussed the limitations, risks, security and privacy concerns of performing an evaluation and management service by telephone and the availability of in person appointments. I also discussed with the patient that there may be a patient responsible charge related to this service. The patient expressed understanding and agreed to proceed.   I provided 30 minutes of non-face-to-face time during this encounter.     05/02/2021 11:13 AM Lucas Knapp  MRN:  [id number]  Chief Complaint: "Things are good. Lucas Knapp been traveling alot"  HPI: 35 year old male seen today for follow up psychiatric evaluation. He has a psychiatric history of paranoid schizophrenia, bipolar disorder, schizoaffective disorder, and cannabis use disorder. He is currently managed on Haldol 15 mg twice daily and cogentin 0.5 mg BID. Today patient reported that his medications are effective in managing his psychiatric conditions.   Patient was unable to logon virtually so his assessment was done over the phone.  During exam he was pleasant, cooperative, and engaged in conversation.  He informed provider that he has been doing well and notes that he has been traveling a lot with his father.  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  Today he notes that he has very minimal anxiety and depression.  Provider conducted a GAD-7 and patient scored a 1, at his last visit he scored a 2.  Provider also conducted a PHQ-9 and patient scored a 0, at his last visit he scored a 0.  He continues to note that his mid back hurts and notes that he takes  Aleve to reduce his pain.  He also notes that he stretches.  Today he denies SI/HI/VAH, mania, or paranoia.    No medication changes made today. Patient is agreeable to continue all medications as prescribed.  No other concerns noted at this time.   Visit Diagnosis:    ICD-10-CM   1. Paranoid schizophrenia (HCC)  F20.0 benztropine (COGENTIN) 0.5 MG tablet    haloperidol (HALDOL) 10 MG tablet    Past Psychiatric History: paranoid schizophrenia, bipolar disorder, schizoaffective disorder, and cannabis use disorder.  Past Medical History:  Past Medical History:  Diagnosis Date  . Back pain   . Bipolar 1 disorder (HCC)   . Hyperlipidemia   . Hypertension    pt has been prescribed HCTZ 12.5 mg daily. Pt was 140/80 on admission.   . Schizophrenia (HCC)    History reviewed. No pertinent surgical history.  Family Psychiatric History: Cousin ADHD, Twin Brother history of schizophrenia and schizoaffective bipolar type  Family History:  Family History  Problem Relation Age of Onset  . Hypertension Father   . Hyperlipidemia Father   . Hypertension Paternal Grandmother   . Hypertension Paternal Grandfather   . Hypertension Brother        identical twin  . Psychosis Brother        "mental issues"      Social History:  Social History   Socioeconomic History  . Marital status: Single    Spouse name: Not on file  . Number of children: Not on file  . Years of education:  Not on file  . Highest education level: Not on file  Occupational History  . Not on file  Tobacco Use  . Smoking status: Current Every Day Smoker    Types: Cigarettes  . Smokeless tobacco: Never Used  . Tobacco comment: 10-15  Vaping Use  . Vaping Use: Not on file  Substance and Sexual Activity  . Alcohol use: Yes    Alcohol/week: 0.0 standard drinks    Comment: occasionally --"I need to drink more"  . Drug use: Yes    Types: Marijuana    Comment: Pt endorsed used of marijuana  . Sexual activity: Not  Currently  Other Topics Concern  . 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    Single   No children   Completed college (bachelors in Statistician)   Enjoys swimming, playing pool, walking   Grew up [place].  Has identical twin and 2 other brothers in [place]   Social Determinants of Health   Financial Resource Strain: Not on file  Food Insecurity: Not on file  Transportation Needs: Not on file  Physical Activity: Not on file  Stress: Not on file  Social Connections: Not on file    Allergies:  Allergies  Allergen Reactions  . Geodon [Ziprasidone Hcl] Other (See Comments)    Reaction:  Made pts throat dry     Metabolic Disorder Labs: Lab Results  Component Value Date   HGBA1C 5.0 12/22/2018   MPG 96.8 12/22/2018   MPG 99.67 07/29/2018   Lab Results  Component Value Date   PROLACTIN 30.0 (H) 07/29/2018   Lab Results  Component Value Date   CHOL 167 12/22/2018   TRIG 90 12/22/2018   HDL 37 (L) 12/22/2018   CHOLHDL 4.5 12/22/2018   VLDL 18 12/22/2018   LDLCALC 112 (H) 12/22/2018   LDLCALC 150 (H) 09/29/2016   Lab Results  Component Value Date   TSH 1.609 12/22/2018   TSH 2.952 07/29/2018    Therapeutic Level Labs: No results found for: LITHIUM No results found for: VALPROATE No components found for:  CBMZ  Current Medications: Current Outpatient Medications  Medication Sig Dispense Refill  . amLODipine (NORVASC) 10 MG tablet Take 1 tablet (10 mg total) by mouth daily. 90 tablet 1  . amLODipine (NORVASC) 5 MG tablet TAKE ONE TABLET BY MOUTH DAILY 30 tablet 2  . benztropine (COGENTIN) 0.5 MG tablet Take 1 tablet (0.5 mg total) by mouth 2 (two) times daily. 60 tablet 2  . haloperidol (HALDOL) 10 MG tablet TAKE 1.5 TABLETS BY MOUTH TWICE A DAY FOR MOOD CONTROL 70 tablet 2  . loratadine (CLARITIN) 10 MG tablet Take 1 tablet (10 mg total) by mouth daily.  (May buy from over the counter): For allergies 30 tablet 11   No current facility-administered medications for this visit.     Musculoskeletal: Strength & Muscle Tone: Unable to assess due to telephone visit Gait & Station: Unable to assess due to telephone visit Patient leans: N/A  Psychiatric Specialty Exam: Review of Systems  There were no vitals taken for this visit.There is no height or weight on file to calculate BMI.  General Appearance: Unable to assess due to telephone visit  Eye Contact:  Unable to assess due to telephone visit  Speech:  Blocked and Normal Rate  Volume:  Normal  Mood:  Euthymic  Affect:  Appropriate and Congruent  Thought Process:  Coherent, Goal Directed and Linear  Orientation:  Full (Time, Place, and Person)  Thought Content: WDL and Logical   Suicidal Thoughts:  No  Homicidal Thoughts:  No  Memory:  Immediate;   Good Recent;   Good Remote;   Good  Judgement:  Good  Insight:  Good  Psychomotor Activity:  Unable to assess due to telephone visit  Concentration:  Concentration: Good and Attention Span: Good  Recall:  Good  Fund of Knowledge: Good  Language: Good  Akathisia:  No  Handed:  Right  AIMS (if indicated):Not done  Assets:  Communication Skills Desire for Improvement Financial Resources/Insurance Housing Social Support  ADL's:  Intact  Cognition: WNL  Sleep:  Good   Screenings: AIMS   Flowsheet Row [facility]) [facility]  AIMS Total Score 0 0    AUDIT   Flowsheet Row [facility]) [facility] Ad[facility] ([facility]  Alcohol Use Disorder Identification Test Final Score (AUDIT) 0 0    GAD-7   Flowsheet Row Video Visit from 05/02/2021 in [facility] Video Visit from 02/04/2021 in [facility]  Total GAD-7 Score 1 2    PHQ2-9   Flowsheet Row Video Visit from 05/02/2021 in [facility] Video Visit from 02/04/2021 in [facility] Office Visit from 06/28/2018 in [facility]  PHQ-2 Total Score 0 0 6  PHQ-9 Total Score 0 0 27    Flowsheet Row Video Visit from 05/02/2021 in [facility] Video Visit from 02/04/2021 in [facility] ED from 12/23/2019 in [facility]  C-SSRS RISK CATEGORY No Risk No Risk No Risk       Assessment and Plan: Patient reported that he is doing well on his current medication regimen. No medication changes made. Patient agreeable to continue all medications as prescribed.   1. Paranoid schizophrenia (HCC)  Continue- benztropine (COGENTIN) 0.5 MG tablet; Take 1 tablet (0.5 mg total) by mouth 2 (two) times daily.  Dispense: 60 tablet; Refill: 2 Continue- haloperidol (HALDOL) 10 MG tablet; Take 1.5 tablets (15 mg total) by mouth 2 (two) times daily. (in the morning): For mood control  Dispense: 70 tablet; Refill: 2   Follow up in 3 months   Shanna Cisco, NP 05/02/2021, 11:13 AM

## 2021-08-02 ENCOUNTER — Encounter (HOSPITAL_COMMUNITY): Payer: Self-pay | Admitting: Psychiatry

## 2021-08-02 ENCOUNTER — Telehealth (INDEPENDENT_AMBULATORY_CARE_PROVIDER_SITE_OTHER): Payer: Medicare HMO | Admitting: Psychiatry

## 2021-08-02 DIAGNOSIS — F200 Paranoid schizophrenia: Secondary | ICD-10-CM | POA: Diagnosis not present

## 2021-08-02 MED ORDER — HALOPERIDOL 10 MG PO TABS: ORAL_TABLET | ORAL | 2 refills | Status: DC

## 2021-08-02 MED ORDER — BENZTROPINE MESYLATE 1 MG PO TABS: 1.0000 mg | ORAL_TABLET | Freq: Two times a day (BID) | ORAL | 2 refills | Status: DC

## 2021-08-02 NOTE — Progress Notes (Signed)
BH MD/PA/NP OP Progress Note Virtual Visit via Telephone Note  I connected with Lucas Knapp on 08/02/21 at 10:30 AM EDT by telephone and verified that I am speaking with the correct person using two identifiers.  Location: Patient: home Provider: Clinic   I discussed the limitations, risks, security and privacy concerns of performing an evaluation and management service by telephone and the availability of in person appointments. I also discussed with the patient that there may be a patient responsible charge related to this service. The patient expressed understanding and agreed to proceed.   I provided 30 minutes of non-face-to-face time during this encounter.     08/02/2021 11:04 AM Lucas Siaerrance Florio  MRN:  [id number]  Chief Complaint: "I feel like the medication is two strong"  HPI: 35 year old male seen today for follow up psychiatric evaluation. He has a psychiatric history of paranoid schizophrenia, bipolar disorder, schizoaffective disorder, and cannabis use disorder. He is currently managed on Haldol 15 mg twice daily and cogentin 0.5 mg BID. Today patient reported that his medications are  somewhat effective in managing his psychiatric conditions.    Patient was unable to logon virtually so his assessment was done over the phone.  During exam he was pleasant, cooperative, and engaged in conversation.  He informed provider that he feels that his haldol is to strong. He notes at times he does not take his day time dose because it is to sedating (noting he can sleep for 16 hours). He also notes that he feel that his jaw/throat tightens after taking his night time dose. He also notes that he feels that his speech changes after taking Haldol. Provider informed patient that it sounds like he is experiencing drug induced EPS and recommended reducing his haldol dose and following up in a month. Provider informed patient that if symptoms worsen with medication adjustments to [facility]  or the ED for further assistance. He endorsed understanding and agreed.Provider unable to do an [place] assessment due to telephone visit. Patient reports that he has cut his pills in half at times. He denies SI/HI/VAH mania or paranoia. He notes that after reducing his dose he had intrusive thoughts about generational curses. Patient notes he felt that for generations his family has suffered financial.  Patient informed provider that his anxiety and depression has increased since his last visit. Provider conducted a GAD 7 and patient scored a 6, at his last visit he scored a 1,  Provider also conducted a PHQ-9 and patient scored a 5, at his last visit he scored a 0.  He notes that his appetite has decreased and reports losing 10 lb in the last month.   Today he is agreeable to reducing Haldol 15 mg BID to 10 mg BID. He is also agreeable to increase Cogentin 0.5 mg BID to 1 mg BID to help reduce drug induced EPS. He will follow up in one month for further evaluation. Provider discussed patient coning in sooner [facility] or ED if symptoms worsen. He endorsed understanding and agreed.  No other concerns noted at this time.   Visit Diagnosis:    ICD-10-CM   1. Paranoid schizophrenia (HCC)  F20.0 benztropine (COGENTIN) 1 MG tablet    haloperidol (HALDOL) 10 MG tablet      Past Psychiatric History: paranoid schizophrenia, bipolar disorder, schizoaffective disorder, and cannabis use disorder.  Past Medical History:  Past Medical History:  Diagnosis Date   Back pain    Bipolar 1 disorder (HCC)  Hyperlipidemia    Hypertension    pt has been prescribed HCTZ 12.5 mg daily. Pt was 140/80 on admission.    Schizophrenia (HCC)    History reviewed. No pertinent surgical history.  Family Psychiatric History: Cousin ADHD, Twin Brother history of schizophrenia and schizoaffective bipolar type  Family History:  Family History  Problem Relation Age of Onset   Hypertension Father    Hyperlipidemia Father     Hypertension Paternal Grandmother    Hypertension Paternal Grandfather    Hypertension Brother        identical twin   Psychosis Brother        "mental issues"      Social History:  Social History   Socioeconomic History   Marital status: Single    Spouse name: Not on file   Number of children: Not on file   Years of education: Not on file   Highest education level: Not on file  Occupational History   Not on file  Tobacco Use   Smoking status: Every Day    Types: Cigarettes   Smokeless tobacco: Never   Tobacco comments:    10-15  Vaping Use   Vaping Use: Not on file  Substance and Sexual Activity   Alcohol use: Yes    Alcohol/week: 0.0 standard drinks    Comment: occasionally --"I need to drink more"   Drug use: Yes    Types: Marijuana    Comment: Pt endorsed used of marijuana   Sexual activity: Not Currently  Other Topics Concern   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    Single   No children   Completed college (bachelors in Statistician)   Enjoys swimming, playing pool, walking   Grew up [place].  Has identical twin and 2 other brothers in [place]   Social Determinants of Health   Financial Resource Strain: Not on file  Food Insecurity: Not on file  Transportation Needs: Not on file  Physical Activity: Not on file  Stress: Not on file  Social Connections: Not on file    Allergies:  Allergies  Allergen Reactions   Geodon [Ziprasidone Hcl] Other (See Comments)    Reaction:  Made pts throat dry     Metabolic Disorder Labs: Lab Results  Component Value Date   HGBA1C 5.0 12/22/2018   MPG 96.8 12/22/2018   MPG 99.67 07/29/2018   Lab Results  Component Value Date   PROLACTIN 30.0 (H) 07/29/2018   Lab Results  Component Value Date   CHOL 167 12/22/2018   TRIG 90 12/22/2018   HDL 37 (L) 12/22/2018   CHOLHDL 4.5 12/22/2018   VLDL 18  12/22/2018   LDLCALC 112 (H) 12/22/2018   LDLCALC 150 (H) 09/29/2016   Lab Results  Component Value Date   TSH 1.609 12/22/2018   TSH 2.952 07/29/2018    Therapeutic Level Labs: No results found for: LITHIUM No results found for: VALPROATE No components found for:  CBMZ  Current Medications: Current Outpatient Medications  Medication Sig Dispense Refill   amLODipine (NORVASC) 10 MG tablet Take 1 tablet (10 mg total) by mouth daily. 90 tablet 1   amLODipine (NORVASC) 5 MG tablet TAKE ONE TABLET BY MOUTH DAILY 30 tablet 2   benztropine (COGENTIN) 1 MG tablet Take 1 tablet (1 mg total) by mouth 2 (two) times daily. 60 tablet 2  haloperidol (HALDOL) 10 MG tablet TAKE 1.5 TABLETS BY MOUTH TWICE A DAY FOR MOOD CONTROL 60 tablet 2   loratadine (CLARITIN) 10 MG tablet Take 1 tablet (10 mg total) by mouth daily. (May buy from over the counter): For allergies 30 tablet 11   No current facility-administered medications for this visit.     Musculoskeletal: Strength & Muscle Tone:  Unable to assess due to telephone visit Gait & Station:  Unable to assess due to telephone visit Patient leans: N/A  Psychiatric Specialty Exam: Review of Systems  There were no vitals taken for this visit.There is no height or weight on file to calculate BMI.  General Appearance:  Unable to assess due to telephone visit  Eye Contact:   Unable to assess due to telephone visit  Speech:  Blocked and Normal Rate  Volume:  Normal  Mood:  Euthymic  Affect:  Appropriate and Congruent  Thought Process:  Coherent, Goal Directed and Linear  Orientation:  Full (Time, Place, and Person)  Thought Content: WDL and Logical   Suicidal Thoughts:  No  Homicidal Thoughts:  No  Memory:  Immediate;   Good Recent;   Good Remote;   Good  Judgement:  Good  Insight:  Good  Psychomotor Activity:   Unable to assess due to telephone visit  Concentration:  Concentration: Good and Attention Span: Good  Recall:  Good  Fund of  Knowledge: Good  Language: Good  Akathisia:  No  Handed:  Right  AIMS (if indicated):Not done  Assets:  Communication Skills Desire for Improvement Financial Resources/Insurance Housing Social Support  ADL's:  Intact  Cognition: WNL  Sleep:  Fair   Screenings: AIMS    Flowsheet Row [facility]) [facility]  AIMS Total Score 0 0      AUDIT    Flowsheet Row [facility]) [facility] ([facility]  Alcohol Use Disorder Identification Test Final Score (AUDIT) 0 0      GAD-7    Flowsheet Row Video Visit from 08/02/2021 in [facility] Video Visit from 05/02/2021 in [facility] Video Visit from 02/04/2021 in [facility]  Total GAD-7 Score 6 1 2       PHQ2-9    Flowsheet Row Video Visit from 08/02/2021 in [facility] Video Visit from 05/02/2021 in [facility] Video Visit from 02/04/2021 in [facility] Office Visit from 06/28/2018 in [facility]  PHQ-2 Total Score 0 0 0 6  PHQ-9 Total Score 5 0 0 27      Flowsheet Row Video Visit from 05/02/2021 in [facility] Video Visit from 02/04/2021 in [facility] ED from 12/23/2019 in [facility]  C-SSRS RISK CATEGORY No Risk No Risk No Risk        Assessment and Plan: Patient endorses hypersomnia, mild anxiety, and mild depression. He also describes symptoms of drug induced EPS (jaw/throat tightening and change is speech). Today he is agreeable to reducing Haldol 15 mg  BID to 10 mg BID. He is also agreeable to increase Cogentin 0.5 mg BID to 1 mg BID to help reduce drug induced EPS. He will follow up  in one month for further evaluation. Provider discussed patient coning in sooner to [facility] or ED if symptoms worsen. He endorsed understanding and agreed  1. Paranoid schizophrenia (HCC)  Continue- benztropine (COGENTIN) 0.5 MG tablet; Take 1 tablet (1 mg total) by mouth 2 (two) times daily.  Dispense: 60 tablet; Refill: 2 Continue- haloperidol (HALDOL) 10 MG tablet; Take 1 tablets (10 mg total) by mouth 2 (two) times daily. (in the morning): For mood control  Dispense: 70 tablet; Refill: 2   Follow up in 1 months   Shanna Cisco, NP 08/02/2021, 11:04 AM

## 2021-09-08 ENCOUNTER — Other Ambulatory Visit: Payer: Self-pay

## 2021-09-08 ENCOUNTER — Ambulatory Visit (HOSPITAL_COMMUNITY)
Admission: EM | Admit: 2021-09-08 | Discharge: 2021-09-08 | Disposition: A | Discharge status: Home / Self Care | Payer: Medicaid Other | Attending: Psychiatry | Admitting: Psychiatry

## 2021-09-08 DIAGNOSIS — F250 Schizoaffective disorder, bipolar type: Secondary | ICD-10-CM | POA: Insufficient documentation

## 2021-09-08 DIAGNOSIS — M549 Dorsalgia, unspecified: Secondary | ICD-10-CM | POA: Insufficient documentation

## 2021-09-08 DIAGNOSIS — G8929 Other chronic pain: Secondary | ICD-10-CM | POA: Insufficient documentation

## 2021-09-08 NOTE — Discharge Instructions (Signed)
Take all medications as prescribed. Keep all follow-up appointments as scheduled.  Do not consume alcohol or use illegal drugs while on prescription medications. Report any adverse effects from your medications to your primary care provider promptly.  In the event of recurrent symptoms or worsening symptoms, call 911, a crisis hotline, or go to the nearest emergency department for evaluation.   
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
## 2021-09-08 NOTE — Progress Notes (Signed)
   09/08/21 1450  [facility] Triage Screening (Walk-ins at [facility] only)  How Did You Hear About [place]? Self  What Is the Reason for Your Visit/Call Today? Patient present to [facility] complaining of back chronic back pain. Denied suicidal/homicidal ideations and denied auditory/visual hallucination. Report he feels his Haldol is having side effects which is causing him back pain. Report drinks alcohol denied additional substance usage. Patient has history of schizo-affective and Bipolar.  How Long Has This Been Causing You Problems? <Week  Have You Recently Had Any Thoughts About Hurting Yourself? No  Are You Planning to Commit Suicide/Harm Yourself At This time? No  Have you Recently Had Thoughts About Hurting Someone [name]? No  Are You Planning To Harm Someone At This Time? No  Are you currently experiencing any auditory, visual or other hallucinations? No  Have You Used Any Alcohol or Drugs in the Past 24 Hours? Yes  How long ago did you use Drugs or Alcohol? Drink alcohol last night, 2 beers  What Did You Use and How Much? 2 beers  Do you have any current medical co-morbidities that require immediate attention? No  Clinician description of patient physical appearance/behavior: Dressed appropriately for the weather. Alert, rate of speech within normal limits.  What Do You Feel Would Help You the Most Today? Medication(s) (follow up with psychiatrist)  If access to [facility] Urgent Care was not available, would you have sought care in the Emergency Department? No  Determination of Need Routine (7 days)  Options For Referral Medication Management (Follow up with psychiatrist on [facility])

## 2021-09-09 ENCOUNTER — Ambulatory Visit (INDEPENDENT_AMBULATORY_CARE_PROVIDER_SITE_OTHER): Payer: Medicare Other | Admitting: Psychiatry

## 2021-09-09 ENCOUNTER — Encounter (HOSPITAL_COMMUNITY): Payer: Self-pay | Admitting: Psychiatry

## 2021-09-09 DIAGNOSIS — F200 Paranoid schizophrenia: Secondary | ICD-10-CM | POA: Diagnosis not present

## 2021-09-09 MED ORDER — BENZTROPINE MESYLATE 1 MG PO TABS: 1.0000 mg | ORAL_TABLET | Freq: Two times a day (BID) | ORAL | 3 refills | Status: DC

## 2021-09-09 MED ORDER — OLANZAPINE 10 MG PO TABS: 10.0000 mg | ORAL_TABLET | Freq: Every day | ORAL | 3 refills | Status: DC

## 2021-09-09 MED ORDER — GABAPENTIN 100 MG PO CAPS: 100.0000 mg | ORAL_CAPSULE | Freq: Three times a day (TID) | ORAL | 3 refills | Status: DC
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
## 2021-12-03 DIAGNOSIS — R0602 Shortness of breath: Secondary | ICD-10-CM | POA: Diagnosis not present

## 2021-12-03 DIAGNOSIS — R059 Cough, unspecified: Secondary | ICD-10-CM | POA: Diagnosis not present

## 2021-12-11 ENCOUNTER — Encounter (HOSPITAL_COMMUNITY): Payer: Medicare Other | Admitting: Psychiatry

## 2022-04-15 ENCOUNTER — Ambulatory Visit (HOSPITAL_COMMUNITY)
Admission: EM | Admit: 2022-04-15 | Discharge: 2022-04-15 | Disposition: A | Discharge status: Home / Self Care | Payer: Medicaid Other | Attending: Registered Nurse | Admitting: Registered Nurse

## 2022-04-15 NOTE — BH Assessment (Signed)
Patient is a 36 year old male that presents this date voluntary to [facility] as a walk in requesting to see Toy Cookey to assist with medication questions. Patient has a history of a Schizoaffective Disorder and is observed to be very disorganized. Patient denies any S/I, H/I or AVH. Patient denies any current SA issues and states he "has many questions" in reference to his "chemistry." Patient was informed that Doyne Keel was not available as patient asked to speak to "another doctor." Patient is declining to be roomed and states he wants to "be out in the open air." Patient renders limited history and declines to interact with this writer stating "if your not a doctor I am not talking to you." Patient was directed back to the waiting area and as noted is refusing to be brought back to a room.     ?

## 2022-04-15 NOTE — ED Notes (Addendum)
Mr Lucas Knapp requested AMA discharge and was able to verbalize understanding that he accepts all risk involved. AMA form was signed and witnessed. ? ?

## 2022-04-17 ENCOUNTER — Encounter (HOSPITAL_COMMUNITY): Payer: Self-pay

## 2022-04-17 ENCOUNTER — Other Ambulatory Visit: Payer: Self-pay

## 2022-04-17 ENCOUNTER — Emergency Department (HOSPITAL_COMMUNITY)
Admission: EM | Admit: 2022-04-17 | Discharge: 2022-04-19 | Disposition: A | Discharge status: Other Acute Inpatient Hospital | Payer: Medicare HMO | Attending: Emergency Medicine | Admitting: Emergency Medicine

## 2022-04-17 DIAGNOSIS — Z20822 Contact with and (suspected) exposure to covid-19: Secondary | ICD-10-CM | POA: Insufficient documentation

## 2022-04-17 DIAGNOSIS — F29 Unspecified psychosis not due to a substance or known physiological condition: Secondary | ICD-10-CM | POA: Diagnosis not present

## 2022-04-17 DIAGNOSIS — R442 Other hallucinations: Secondary | ICD-10-CM | POA: Diagnosis not present

## 2022-04-17 DIAGNOSIS — I10 Essential (primary) hypertension: Secondary | ICD-10-CM | POA: Insufficient documentation

## 2022-04-17 DIAGNOSIS — R440 Auditory hallucinations: Secondary | ICD-10-CM | POA: Diagnosis present

## 2022-04-17 LAB — COMPREHENSIVE METABOLIC PANEL
ALT: 44 U/L (ref 0–44)
AST: 58 U/L — ABNORMAL HIGH (ref 15–41)
Albumin: 4.1 g/dL (ref 3.5–5.0)
Alkaline Phosphatase: 63 U/L (ref 38–126)
Anion gap: 8 (ref 5–15)
BUN: 13 mg/dL (ref 6–20)
CO2: 23 mmol/L (ref 22–32)
Calcium: 9 mg/dL (ref 8.9–10.3)
Chloride: 106 mmol/L (ref 98–111)
Creatinine, Ser: 0.96 mg/dL (ref 0.61–1.24)
GFR, Estimated: >60 mL/min (ref >60)
Glucose, Bld: 90 mg/dL (ref 70–99)
Potassium: 3.7 mmol/L (ref 3.5–5.1)
Sodium: 137 mmol/L (ref 135–145)
Total Bilirubin: 0.8 mg/dL (ref 0.3–1.2)
Total Protein: 7 g/dL (ref 6.5–8.1)

## 2022-04-17 LAB — CBC WITH DIFFERENTIAL/PLATELET
Abs Immature Granulocytes: 0.01 10*3/uL (ref 0.00–0.07)
Basophils Absolute: 0.1 10*3/uL (ref 0.0–0.1)
Basophils Relative: 1 %
Eosinophils Absolute: 0.1 10*3/uL (ref 0.0–0.5)
Eosinophils Relative: 2 %
HCT: 42.5 % (ref 39.0–52.0)
Hemoglobin: 14.5 g/dL (ref 13.0–17.0)
Immature Granulocytes: 0 %
Lymphocytes Relative: 50 %
Lymphs Abs: 3.1 10*3/uL (ref 0.7–4.0)
MCH: 31.2 pg (ref 26.0–34.0)
MCHC: 34.1 g/dL (ref 30.0–36.0)
MCV: 91.4 fL (ref 80.0–100.0)
Monocytes Absolute: 0.5 10*3/uL (ref 0.1–1.0)
Monocytes Relative: 8 %
Neutro Abs: 2.5 10*3/uL (ref 1.7–7.7)
Neutrophils Relative %: 39 %
Platelets: 320 10*3/uL (ref 150–400)
RBC: 4.65 MIL/uL (ref 4.22–5.81)
RDW: 12.8 % (ref 11.5–15.5)
WBC: 6.2 10*3/uL (ref 4.0–10.5)
nRBC: 0 % (ref 0.0–0.2)

## 2022-04-17 LAB — RESP PANEL BY RT-PCR (FLU A&B, COVID) ARPGX2
Influenza A by PCR: NEGATIVE
Influenza B by PCR: NEGATIVE
SARS Coronavirus 2 by RT PCR: NEGATIVE

## 2022-04-17 LAB — ETHANOL: Alcohol, Ethyl (B): <10 mg/dL (ref <10)

## 2022-04-17 LAB — SALICYLATE LEVEL: Salicylate Lvl: <7 mg/dL — ABNORMAL LOW (ref 7.0–30.0)

## 2022-04-17 LAB — ACETAMINOPHEN LEVEL: Acetaminophen (Tylenol), Serum: <10 ug/mL — ABNORMAL LOW (ref 10–30)

## 2022-04-17 NOTE — ED Notes (Signed)
Pt refuses to give urine sample at this time  
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
## 2022-04-17 NOTE — ED Triage Notes (Signed)
Pt BIB [facility]. Pt was having hallucinations and stating that his back hurts. Pt has IVC paperwork.   ?

## 2022-04-17 NOTE — ED Notes (Signed)
Pt IVC paperwork states "respondent is actively experiencing auditory and visual hallucinations as evidenced by him stopping conversation with petitioner to turn and engage in a different conversation nobody else present. Respondent displaying grossly disorganized thoughts and is profoundly tangential. Respondent frequently quenches and yells in pain stating a device was implanted in his back to cause him pain. Respondent is diagnosed with bipolar disorder and is not taking medication. Respondent may be under the influence of narcotics but denied the same." ?

## 2022-04-17 NOTE — BH Assessment (Addendum)
@  2216, notified patient's nurse that Clinician called the cart but no answer.  ? ?@2210 , called the TTS machine again. The machine rings on this Clinicians end, no answer/response.  ? ?@2207 , patient's nurse , RN) set machine up. Called machine x2, no response.  ? ?@2156 , requested patient's nurse to set up the TTS machine to complete patient's initial TTS assessment.  ?

## 2022-04-17 NOTE — ED Notes (Signed)
Pt refused covid swab at this time

## 2022-04-18 LAB — RAPID URINE DRUG SCREEN, HOSP PERFORMED
Amphetamines: NOT DETECTED
Barbiturates: NOT DETECTED
Benzodiazepines: NOT DETECTED
Cocaine: NOT DETECTED
Opiates: NOT DETECTED
Tetrahydrocannabinol: POSITIVE — AB

## 2022-04-18 LAB — LIPID PANEL
Cholesterol: 159 mg/dL (ref 0–200)
HDL: 50 mg/dL (ref >40)
LDL Cholesterol: 91 mg/dL (ref 0–99)
Total CHOL/HDL Ratio: 3.2 RATIO
Triglycerides: 91 mg/dL (ref <150)
VLDL: 18 mg/dL (ref 0–40)

## 2022-04-18 MED ORDER — OLANZAPINE 10 MG PO TBDP
10.0000 mg | ORAL_TABLET | Freq: Every day | ORAL | Status: DC
Start: 2022-04-18 — End: 2022-04-19
  Administered 2022-04-18 – 2022-04-19 (×2): 10 mg via ORAL
  Filled 2022-04-18 (×2): qty 1

## 2022-04-18 MED ORDER — MELATONIN 3 MG PO TABS
3.0000 mg | ORAL_TABLET | Freq: Every day | ORAL | Status: DC
  Administered 2022-04-18: 3 mg via ORAL
  Filled 2022-04-18: qty 1

## 2022-04-18 MED ORDER — OLANZAPINE 5 MG PO TBDP
5.0000 mg | ORAL_TABLET | Freq: Every day | ORAL | Status: DC
  Administered 2022-04-18: 5 mg via ORAL
  Filled 2022-04-18: qty 1
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
## 2022-04-18 NOTE — Progress Notes (Signed)
Patient has been denied by [facility] due to no appropriate beds available. Patient meets BH inpatient criteria per Nira Conn, NP. Patient has been faxed out to the following facilities:  ? ?[facility]  [address] [place] [place] [address] [phone]  ?[facility]  [address] [place] [place] [address] [phone]  ?[facility]  [address], [place] [address] [phone]  ?[facility]  [address], [place] [address] [phone]  ?[facility]  [address], [place] [address] [phone]  ?[facility]  [address] [place], [place] [address] [phone]  ?[facility]  [address] [place], [place] [address] [phone]  ?[facility]  [address] [place] [address] [phone]  ?[facility]  [address], [place] [address] [phone]  ?[facility]  [address] [place] [address] [phone]  ? ?Damita Dunnings, MSW, LCSW-A  ?3:07 PM 04/18/2022   ?

## 2022-04-18 NOTE — Progress Notes (Signed)
[facility] LCSW Progress Note ?  ?04/18/2022    3:34 PM ? ?[facility]  ? ?[id number]  ? ?Type of Contact and Topic:  Psychiatric Bed Placement  ? ?Pt accepted to [facility]    ? ?Patient meets inpatient criteria per Lindon Romp, NP ? ?The attending provider will be Myles Lipps, MD  ? ?Call report to [phone] or [phone] ? ?Tenny Craw, RN @ [facility] ED was notified ? ?Pt scheduled  to arrive at [place] 0900.  ? ? ?Mariea Clonts, MSW, LCSW-A  ?3:36 PM 04/18/2022   ?  ? ?  ?  ? ? ? ? ?  ?

## 2022-04-18 NOTE — ED Notes (Signed)
Pt awake and restless at this time. Appears to be having auditory hallucinations based on the conversations with self and the way he is looking around the room. Came to the door requesting melatonin and speaking about [place], but Clinical research associate unable to gather what he was talking about. Dr. Deretha Emory made aware and medication ordered  ?

## 2022-04-18 NOTE — ED Provider Notes (Signed)
Patient seen by me.  Patient requesting some additional Zyprexa.  Was given 10 mg earlier today.  But it was just a one-time dose.  Dissolvable tablet patient also requesting melatonin to help sleep.  Patient we will go ahead and order Zyprexa 5 mg at bedtime daily.  And then behavioral health can decide how they want to deal with the Zyprexa otherwise.  It was behavioral health that ordered the Zyprexa earlier today. ? ?Review shows that patient will be inpatient he is under involuntary commitment.  And they are looking for placement. ?  ?Fredia Sorrow, MD ?04/18/22 2141 ? ?

## 2022-04-19 DIAGNOSIS — F1220 Cannabis dependence, uncomplicated: Secondary | ICD-10-CM | POA: Diagnosis not present

## 2022-04-19 DIAGNOSIS — I10 Essential (primary) hypertension: Secondary | ICD-10-CM | POA: Diagnosis not present

## 2022-04-19 DIAGNOSIS — F29 Unspecified psychosis not due to a substance or known physiological condition: Secondary | ICD-10-CM | POA: Diagnosis not present

## 2022-04-19 DIAGNOSIS — F200 Paranoid schizophrenia: Secondary | ICD-10-CM | POA: Diagnosis not present

## 2022-04-19 DIAGNOSIS — Z91148 Patient's other noncompliance with medication regimen for other reason: Secondary | ICD-10-CM | POA: Diagnosis not present

## 2022-04-19 DIAGNOSIS — Z20822 Contact with and (suspected) exposure to covid-19: Secondary | ICD-10-CM | POA: Diagnosis not present

## 2022-04-19 MED ORDER — ALBUTEROL SULFATE HFA 108 (90 BASE) MCG/ACT IN AERS
2.0000 | INHALATION_SPRAY | Freq: Once | RESPIRATORY_TRACT | Status: AC
Start: 2022-04-19 — End: 2022-04-19
  Administered 2022-04-19: 2 via RESPIRATORY_TRACT
  Filled 2022-04-19: qty 6.7

## 2022-04-19 NOTE — ED Notes (Signed)
Patient DC d off unit to facility per provider. Patient alert, cooperative, no s/s of distress.. Patient ambulatory off unit. DC information and belongings given to sheriff for transport. Patient escorted and transported by sheriff. ?

## 2022-04-19 NOTE — ED Provider Notes (Addendum)
Emergency Medicine Observation Re-evaluation Note ? ?[facility] is a 36 y.o. male, seen on rounds today.  Pt initially presented to the ED for complaints of IVC and Hallucinations ?Currently, the patient is resting. ? ?Physical Exam  ?BP (!) 137/92   Pulse 86   Temp 97.9 ?F (36.6 ?C)   Resp 18   Ht 5\' 4"  (1.626 m)   Wt 83.9 kg   SpO2 98%   BMI 31.76 kg/m?  ?Physical Exam ?General: NAD ?Cardiac: well perfused ?Lungs: even and unlabored, mild expiratory wheezing present ?Psych: no agitation ? ?ED Course / MDM  ?EKG:EKG Interpretation ? ?Date/Time:  Saturday April 19 2022 09:25:07 EDT ?Ventricular Rate:  76 ?PR Interval:  148 ?QRS Duration: 90 ?QT Interval:  356 ?QTC Calculation: 400 ?R Axis:   60 ?Text Interpretation: Normal sinus rhythm with sinus arrhythmia Normal ECG When compared with ECG of 17-Apr-2022 20:07, PREVIOUS ECG IS PRESENT Confirmed by 19-Apr-2022 ([id number]) on 04/19/2022 9:44:35 AM ? ?I have reviewed the labs performed to date as well as medications administered while in observation.  Recent changes in the last 24 hours include patient with some agitation overnight. Medically cleared and evaluated by psych yesterday.  ? ?Patient medically cleared. ST elevations on EKG similar to prior EKG in January. Patient without symptoms of pericarditis at this time. No chest pain, no friction rub on physical exam. Medically clear for psychiatric admission. Some mild wheezing in a patient who endorses a hx of smoking and cannabis use. Will administer an Albuterol MDI. Repeat EKG without ST elevations or PR depressions. Remains medically cleared. ? ?Patient was accepted to [facility] by [facility], MD for today after 0900. ? ?Plan  ?Current plan is for inpatient admission to [facility]. ?[facility] is under involuntary commitment. ?  ? ?  ?[name], MD ?04/19/22 0813 ? ? ?  ?04/21/22, MD ?04/19/22 0945 ? ?

## 2022-05-08 ENCOUNTER — Telehealth (HOSPITAL_COMMUNITY): Payer: Medicare HMO | Admitting: Psychiatry

## 2022-07-02 ENCOUNTER — Encounter (HOSPITAL_COMMUNITY): Payer: Medicare HMO | Admitting: Psychiatry

## 2022-07-02 ENCOUNTER — Telehealth (HOSPITAL_COMMUNITY): Payer: Self-pay

## 2022-07-02 ENCOUNTER — Ambulatory Visit (HOSPITAL_COMMUNITY)
Admission: EM | Admit: 2022-07-02 | Discharge: 2022-07-03 | Disposition: A | Discharge status: Other Acute Inpatient Hospital | Payer: Medicare HMO | Attending: Registered Nurse | Admitting: Registered Nurse

## 2022-07-02 ENCOUNTER — Encounter (HOSPITAL_COMMUNITY): Payer: Self-pay | Admitting: Registered Nurse

## 2022-07-02 DIAGNOSIS — F1210 Cannabis abuse, uncomplicated: Secondary | ICD-10-CM | POA: Diagnosis not present

## 2022-07-02 DIAGNOSIS — F200 Paranoid schizophrenia: Secondary | ICD-10-CM | POA: Diagnosis not present

## 2022-07-02 DIAGNOSIS — F17210 Nicotine dependence, cigarettes, uncomplicated: Secondary | ICD-10-CM | POA: Diagnosis not present

## 2022-07-02 DIAGNOSIS — Z20822 Contact with and (suspected) exposure to covid-19: Secondary | ICD-10-CM | POA: Insufficient documentation

## 2022-07-02 DIAGNOSIS — Z91148 Patient's other noncompliance with medication regimen for other reason: Secondary | ICD-10-CM | POA: Diagnosis not present

## 2022-07-02 DIAGNOSIS — I10 Essential (primary) hypertension: Secondary | ICD-10-CM | POA: Diagnosis not present

## 2022-07-02 DIAGNOSIS — E785 Hyperlipidemia, unspecified: Secondary | ICD-10-CM | POA: Diagnosis not present

## 2022-07-02 DIAGNOSIS — F319 Bipolar disorder, unspecified: Secondary | ICD-10-CM

## 2022-07-02 DIAGNOSIS — F209 Schizophrenia, unspecified: Secondary | ICD-10-CM | POA: Insufficient documentation

## 2022-07-02 DIAGNOSIS — Z59819 Housing instability, housed unspecified: Secondary | ICD-10-CM

## 2022-07-02 DIAGNOSIS — F17200 Nicotine dependence, unspecified, uncomplicated: Secondary | ICD-10-CM | POA: Diagnosis present

## 2022-07-02 LAB — RESP PANEL BY RT-PCR (FLU A&B, COVID) ARPGX2
Influenza A by PCR: NEGATIVE
Influenza B by PCR: NEGATIVE
SARS Coronavirus 2 by RT PCR: NEGATIVE

## 2022-07-02 LAB — POC SARS CORONAVIRUS 2 AG: SARSCOV2ONAVIRUS 2 AG: NEGATIVE

## 2022-07-02 MED ORDER — TRAZODONE HCL 50 MG PO TABS: 50.0000 mg | ORAL_TABLET | Freq: Every evening | ORAL | Status: DC | PRN

## 2022-07-02 MED ORDER — ALUM & MAG HYDROXIDE-SIMETH 200-200-20 MG/5ML PO SUSP: 30.0000 mL | ORAL | Status: DC | PRN

## 2022-07-02 MED ORDER — LORAZEPAM 1 MG PO TABS
1.0000 mg | ORAL_TABLET | ORAL | Status: AC | PRN
  Administered 2022-07-02: 1 mg via ORAL
  Filled 2022-07-02: qty 1

## 2022-07-02 MED ORDER — HYDROXYZINE HCL 25 MG PO TABS
25.0000 mg | ORAL_TABLET | Freq: Three times a day (TID) | ORAL | Status: DC | PRN
  Administered 2022-07-03: 25 mg via ORAL
  Filled 2022-07-02: qty 1

## 2022-07-02 MED ORDER — OLANZAPINE 5 MG PO TBDP
5.0000 mg | ORAL_TABLET | Freq: Three times a day (TID) | ORAL | Status: DC | PRN
  Administered 2022-07-02 – 2022-07-03 (×2): 5 mg via ORAL
  Filled 2022-07-02 (×3): qty 1

## 2022-07-02 MED ORDER — ACETAMINOPHEN 325 MG PO TABS
650.0000 mg | ORAL_TABLET | Freq: Four times a day (QID) | ORAL | Status: DC | PRN
  Filled 2022-07-02: qty 2

## 2022-07-02 MED ORDER — OLANZAPINE 10 MG PO TBDP
10.0000 mg | ORAL_TABLET | Freq: Every day | ORAL | Status: DC
  Administered 2022-07-02 – 2022-07-03 (×2): 10 mg via ORAL
  Filled 2022-07-02 (×2): qty 1

## 2022-07-02 MED ORDER — MAGNESIUM HYDROXIDE 400 MG/5ML PO SUSP: 30.0000 mL | Freq: Every day | ORAL | Status: DC | PRN

## 2022-07-02 NOTE — Telephone Encounter (Signed)
Medication management - Met with patient, after he arrived to be seen in the [facility] outpatient but was several hours late.  Nolon Nations, Surveyor, quantity requested patient be worked in due to concerns for patient's noted disorganizational thoughts and reported appearance patient may have been attending to internal stimuli.  Met with Dr. Dwyane Dee with Nolon Nations who agreed to work patient in to be seen; however, patient had left the [facility] outpatient area by then.  Met with patient in the parking lot who agreed to return back in the building to be evaluated due to concerns and presentation.  Dr. Dwyane Dee arranged for patient to be assessed in the [facility] and waited with patient who agreed until they took him back for his assessment.

## 2022-07-02 NOTE — ED Notes (Addendum)
[facility] Marie RN on unit talking with pt and appears to have good rapport with [facility] has agreed to take medication for anxiety and agitation. Zyprexa 5 mg po and ativan 1 mg po. IVC papers re-faxed at 22:55

## 2022-07-02 NOTE — BH Assessment (Signed)
Comprehensive Clinical Assessment (CCA) Note  07/02/2022 Lucas Knapp [id number]  DISPOSITION: Per Shuvon Rankin, pt is recommended for continuous observation and re-assessment  The patient demonstrates the following risk factors for suicide: Chronic risk factors for suicide include: psychiatric disorder of schizophrenia and substance use disorder. Acute risk factors for suicide include: social withdrawal/isolation. Protective factors for this patient include: hope for the future. Considering these factors, the overall suicide risk at this point appears to be low. Patient is appropriate for outpatient follow up.  Flowsheet Row ED from 07/02/2022 in [facility] ED from 04/17/2022 in [facility] Clinical Support from 09/09/2021 in [facility]  C-SSRS RISK CATEGORY Error: Question 6 not populated No Risk Error: Q7 should not be populated when Q6 is No      Pt is a 36 yo male who presented voluntarily and unaccompanied. Pt was expressing a flight of ideas of a religious and political nature. Pt denied SI, HI, AVH and paranoia. Pt mentioned any accident in 2001 and medications he was prescribed but did not give further details. Pt reported regular cannabis use "when I can afford it." Pt is homeless but stated that someone he could not name is helping him "have shelter over my head." Soon after, pt denied any supports. Pt stated that he was prescribed medications by Dr. Doyne Keel but has not taken them consistently per his report. Pt was unable to answer many questions due to his disorganized thought process including Depression Scale questions.  Pt was calm, alert but seemed disoriented and was expressing what seemed to be a flight of ideas at times. He was able to answer many questions appropriately and at other times he answered questions with religious or politically fixated answers. Pt's speech and movement seems  within normal limits. Pt did report regular use of cannabis and an accident that greatly affected him but no details were given.   Chief Complaint:  Chief Complaint  Patient presents with   Schizophrenia   Visit Diagnosis:  Schizophrenia, paranoid type    CCA Screening, Triage and Referral (STR)  Patient Reported Information How did you hear about [place]? Self  What Is the Reason for Your Visit/Call Today? Pt is a 36 yo male who presented voluntarily and unaccompanied. Pt was expressing a flight of ideas of a religious and political nature. Pt denied SI, HI, AVH and paranoia. Pt mentioned any accident in 2001 and medications he was prescribed but did not give further details. Pt reported regular cannabis use "when I can afford it." Pt is homeless but stated that someone he could not name is helping him "have shelter over my head." Soon after, pt denied any supports. Pt stated that he was prescribed medications by Dr. Doyne Keel but has not taken them consistently per his report.  How Long Has This Been Causing You Problems? > than 6 months  What Do You Feel Would Help You the Most Today? Treatment for Depression or other mood problem; Medication(s)   Have You Recently Had Any Thoughts About Hurting Yourself? No  Are You Planning to Commit Suicide/Harm Yourself At This time? No   Have you Recently Had Thoughts About Hurting Someone Lucas Knapp? No  Are You Planning to Harm Someone at This Time? No  Explanation: No data recorded  Have You Used Any Alcohol or Drugs in the Past 24 Hours? No  How Long Ago Did You Use Drugs or Alcohol? No data recorded What Did You Use  and How Much? 2 beers   Do You Currently Have a Therapist/Psychiatrist? Yes  Name of Therapist/Psychiatrist: Pt stated that he sees Dr. Doyne Keel for his psychiatric medication management.   Have You Been Recently Discharged From Any Office Practice or Programs? No  Explanation of Discharge From Practice/Program: No data  recorded    CCA Screening Triage Referral Assessment Type of Contact: Face-to-Face  Telemedicine Service Delivery:   Is this Initial or Reassessment? Initial Assessment  Date Telepsych consult ordered in [facility]:  04/17/22  Time Telepsych consult ordered in [facility]:  [id number]  Location of Assessment: [facility] Assessment Services  Provider Location: [facility] Assessment Services   Collateral Involvement: none   Does Patient Have a Automotive engineer Guardian? No data recorded Name and Contact of Legal Guardian: No data recorded If Minor and Not Living with Parent(s), Who has Custody? NA  Is CPS involved or ever been involved? -- Rich Reining)  Is APS involved or ever been involved? -- Rich Reining)   Patient Determined To Be At Risk for Harm To Self or Others Based on Review of Patient Reported Information or Presenting Complaint? Yes, for Self-Harm  Method: No data recorded Availability of Means: No data recorded Intent: No data recorded Notification Required: No data recorded Additional Information for Danger to Others Potential: No data recorded Additional Comments for Danger to Others Potential: No data recorded Are There Guns or Other Weapons in Your Home? No data recorded Types of Guns/Weapons: No data recorded Are These Weapons Safely Secured?                            No data recorded Who Could Verify You Are Able To Have These Secured: No data recorded Do You Have any Outstanding Charges, Pending Court Dates, Parole/Probation? No data recorded Contacted To Inform of Risk of Harm To Self or Others: Law Enforcement    Does Patient Present under Involuntary Commitment? No  IVC Papers Initial File Date: 04/17/22   [place] of Residence: [place]   Patient Currently Receiving the Following Services: Medication Management   Determination of Need: Urgent (48 hours) (Per Shuvon Rankin, pt is recommended for continuous observation and re-assessment)   Options For Referral: [facility] Urgent  Care     CCA Biopsychosocial Patient Reported Schizophrenia/Schizoaffective Diagnosis in Past: Yes   Strengths: Unable to assess   Mental Health Symptoms Depression:   Change in energy/activity; Difficulty Concentrating; Irritability; Sleep (too much or little)   Duration of Depressive symptoms:    Mania:   Change in energy/activity; Increased Energy; Irritability; Racing thoughts   Anxiety:    Difficulty concentrating; Irritability; Restlessness; Sleep; Tension; Worrying   Psychosis:   Delusions; Hallucinations; Grossly disorganized speech   Duration of Psychotic symptoms:  Duration of Psychotic Symptoms: Greater than six months   Trauma:   None   Obsessions:   None   Compulsions:   None   Inattention:   N/A   Hyperactivity/Impulsivity:   N/A   Oppositional/Defiant Behaviors:   N/A   Emotional Irregularity:   None   Other Mood/Personality Symptoms:   NA    Mental Status Exam Appearance and self-care  Stature:   Average   Weight:   Overweight   Clothing:   Casual   Grooming:   Normal   Cosmetic use:   None   Posture/gait:   Normal   Motor activity:   Not Remarkable   Sensorium  Attention:   Confused  Concentration:   Scattered   Orientation:   Person; Place   Recall/memory:   -- (Memory seemed impaired)   Affect and Mood  Affect:   Flat   Mood:   Anxious; Irritable; Other (Comment) (Suspicious)   Relating  Eye contact:   Fleeting   Facial expression:   Anxious; Tense   Attitude toward examiner:   Suspicious; Irritable; Defensive; Guarded   Thought and Language  Speech flow:  Flight of Ideas; Paucity; Pressured   Thought content:   Delusions; Suspicious; Persecutions   Preoccupation:   None   Hallucinations:   Auditory ("hearing the voice of god")   Organization:  No data recorded  [employer] of Knowledge:   Average   Intelligence:   Average   Abstraction:   Concrete    Judgement:   Poor   Reality Testing:   Distorted   Insight:   Poor   Decision Making:   Confused   Social Functioning  Social Maturity:   Isolates   Social Judgement:   Normal   Stress  Stressors:   Housing; Office manager Ability:   Deficient supports   Skill Deficits:   None   Supports:   Support needed     Religion: Religion/Spirituality Are You A Religious Person?:  (Unable to assess due to disorganized thought process.)  Leisure/Recreation: Leisure / Recreation Do You Have Hobbies?:  (Unable to assess due to disorganized thought process.)  Exercise/Diet: Exercise/Diet Do You Exercise?:  (Unable to assess due to disorganized thought process.) Have You Gained or Lost A Significant Amount of Weight in the Past Six Months?:  (Unable to assess due to disorganized thought process.) Do You Follow a Special Diet?:  (Unable to assess due to disorganized thought process.) Do You Have Any Trouble Sleeping?: Yes   CCA Employment/Education Employment/Work Situation: Employment / Work Situation Employment Situation:  (Unable to assess due to disorganized thought process.) Patient's Job has Been Impacted by Current Illness:  (Unable to assess due to disorganized thought process.) Has Patient ever Been in the [employer]?:  (Unable to assess due to disorganized thought process.)  Education: Education Is Patient Currently Attending School?:  (uta) Last Grade Completed:  (Unable to assess due to disorganized thought process.) Did You Attend College?:  (Unable to assess due to disorganized thought process.) Did You Have An Individualized Education Program (IIEP):  (Unable to assess due to disorganized thought process.) Did You Have Any Difficulty At School?:  (Unable to assess due to disorganized thought process.)   CCA Family/Childhood History Family and Relationship History: Family history Marital status: Single Does patient have children?: No  Childhood  History:  Childhood History By whom was/is the patient raised?: Both parents Did patient suffer any verbal/emotional/physical/sexual abuse as a child?: No Has patient ever been sexually abused/assaulted/raped as an adolescent or adult?: No Witnessed domestic violence?: No Has patient been affected by domestic violence as an adult?: No  Child/Adolescent Assessment:     CCA Substance Use Alcohol/Drug Use: Alcohol / Drug Use Pain Medications: see MAR Prescriptions: see MAR Over the Counter: see MAR History of alcohol / drug use?: Yes (Pt's previous UDS indicates a history of cocaine use.) Longest period of sobriety (when/how long): Unknown Negative Consequences of Use:  (NA) Withdrawal Symptoms:  (NA) Substance #1 Name of Substance 1: cannabis 1 - Age of First Use: unknown 1 - Amount (size/oz): unknown 1 - Frequency: "a few times a week" 1 - Duration: ongoing 1 - Last Use /  Amount: unknown 1 - Method of Aquiring: unknown 1- Route of Use: smoke                       ASAM's:  Six Dimensions of Multidimensional Assessment  Dimension 1:  Acute Intoxication and/or Withdrawal Potential:      Dimension 2:  Biomedical Conditions and Complications:      Dimension 3:  Emotional, Behavioral, or Cognitive Conditions and Complications:     Dimension 4:  Readiness to Change:     Dimension 5:  Relapse, Continued use, or Continued Problem Potential:     Dimension 6:  Recovery/Living Environment:     ASAM Severity Score:    ASAM Recommended Level of Treatment:     Substance use Disorder (SUD)    Recommendations for Services/Supports/Treatments:    Discharge Disposition:    DSM5 Diagnoses: Patient Active Problem List   Diagnosis Date Noted   Paranoid schizophrenia (HCC) 12/21/2018   Preventative health care 09/29/2016   HTN (hypertension) 02/18/2016   Hyperlipidemia 02/18/2016   Fatigue 02/18/2016   Low back pain 02/18/2016   Abdominal pain 11/27/2014    Schizophrenia, paranoid type (HCC) 09/17/2012    Class: Acute   Cannabis abuse 09/17/2012     Referrals to Alternative Service(s): Referred to Alternative Service(s):   Place:   Date:   Time:    Referred to Alternative Service(s):   Place:   Date:   Time:    Referred to Alternative Service(s):   Place:   Date:   Time:    Referred to Alternative Service(s):   Place:   Date:   Time:     Carolanne Grumbling, Counselor  Corrie Dandy T. Jimmye Norman, MS, [facility], [facility] - [facility] Triage Specialist [facility]

## 2022-07-02 NOTE — ED Notes (Signed)
Pt is awake at this time. He is eating.  This staff person asked him if I could draw his blood.  Pt went on a long tangential rant about the why he feels he is here and how the doctor needs to see him.  Pt continues to be extremely disorganized. Staff will monitor for safety.

## 2022-07-02 NOTE — ED Notes (Signed)
Pt is awake and alert.  Speech clear and makes good eye contact when he speaks. Pt does have some disorganized thought but denies SI, HI or AVH. Pt stated " I will go here to get some help but the lava and situation needs to get better".  Pt was searched with no contraband found.  He refused blood work and uds at time of admission.  He did agree to take medication and stated " after I rest a little while we can do that".    Pt oriented to flex obs unit and after changing clothes went to sleep.  Staff will monitor for safety.
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
## 2022-07-02 NOTE — ED Notes (Signed)
Pt just arrived to the unit  

## 2022-07-02 NOTE — ED Notes (Signed)
Pt resting quietly. Breathing even and unlabored. Pt is in view of nurses station.  Staff will continue to monitor for safety.

## 2022-07-02 NOTE — ED Notes (Signed)
Verbally agitated speaking loudly repeatedly states why am I here how can you hold me here like I'm in jail. I informed him that he has been involuntarily committed to get his medications regulated. Lucas Knapp remains angry and upset and intermittantly having rambling conversation about nanobots and drug companies putting them in his medication so they could see what he is doing.
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
## 2022-07-02 NOTE — ED Notes (Signed)
Patient arrived on unit. Patient cooperative and calm. Patient safe on unit with continued monitoring.

## 2022-07-02 NOTE — ED Notes (Signed)
Lucas Knapp continues to refuse labs and medication and request that he be allowed to speak with the doctor because he needs to go to work in the morning and needs to be discharged. I informed him that I would speak with his provider and ask him to come talk with him. Lucas Knapp is very focused on getting to work on time tomorrow, provider Williams notified.

## 2022-07-03 ENCOUNTER — Other Ambulatory Visit: Payer: Self-pay

## 2022-07-03 ENCOUNTER — Encounter (HOSPITAL_COMMUNITY): Payer: Self-pay | Admitting: Student

## 2022-07-03 ENCOUNTER — Encounter (HOSPITAL_COMMUNITY): Payer: Self-pay | Admitting: Psychiatry

## 2022-07-03 ENCOUNTER — Inpatient Hospital Stay (HOSPITAL_COMMUNITY)
Admission: AD | Admit: 2022-07-03 | Discharge: 2022-07-10 | DRG: 885 | Disposition: A | Discharge status: Home / Self Care | Payer: Medicare HMO | Source: Intra-hospital | Attending: Psychiatry | Admitting: Psychiatry

## 2022-07-03 DIAGNOSIS — F17210 Nicotine dependence, cigarettes, uncomplicated: Secondary | ICD-10-CM | POA: Diagnosis present

## 2022-07-03 DIAGNOSIS — Z91148 Patient's other noncompliance with medication regimen for other reason: Secondary | ICD-10-CM | POA: Diagnosis not present

## 2022-07-03 DIAGNOSIS — F201 Disorganized schizophrenia: Secondary | ICD-10-CM | POA: Diagnosis not present

## 2022-07-03 DIAGNOSIS — Z79899 Other long term (current) drug therapy: Secondary | ICD-10-CM

## 2022-07-03 DIAGNOSIS — Z83438 Family history of other disorder of lipoprotein metabolism and other lipidemia: Secondary | ICD-10-CM | POA: Diagnosis not present

## 2022-07-03 DIAGNOSIS — Z59819 Housing instability, housed unspecified: Secondary | ICD-10-CM

## 2022-07-03 DIAGNOSIS — Z20822 Contact with and (suspected) exposure to covid-19: Secondary | ICD-10-CM | POA: Diagnosis not present

## 2022-07-03 DIAGNOSIS — Z8249 Family history of ischemic heart disease and other diseases of the circulatory system: Secondary | ICD-10-CM

## 2022-07-03 DIAGNOSIS — Z5900 Homelessness unspecified: Secondary | ICD-10-CM

## 2022-07-03 DIAGNOSIS — F17200 Nicotine dependence, unspecified, uncomplicated: Secondary | ICD-10-CM | POA: Diagnosis not present

## 2022-07-03 DIAGNOSIS — E785 Hyperlipidemia, unspecified: Secondary | ICD-10-CM | POA: Diagnosis not present

## 2022-07-03 DIAGNOSIS — F319 Bipolar disorder, unspecified: Secondary | ICD-10-CM

## 2022-07-03 DIAGNOSIS — F1210 Cannabis abuse, uncomplicated: Secondary | ICD-10-CM | POA: Diagnosis not present

## 2022-07-03 DIAGNOSIS — Z818 Family history of other mental and behavioral disorders: Secondary | ICD-10-CM | POA: Diagnosis not present

## 2022-07-03 DIAGNOSIS — I10 Essential (primary) hypertension: Secondary | ICD-10-CM | POA: Diagnosis not present

## 2022-07-03 DIAGNOSIS — F200 Paranoid schizophrenia: Secondary | ICD-10-CM | POA: Diagnosis not present

## 2022-07-03 DIAGNOSIS — F209 Schizophrenia, unspecified: Principal | ICD-10-CM | POA: Diagnosis present

## 2022-07-03 LAB — CBC WITH DIFFERENTIAL/PLATELET
Abs Immature Granulocytes: 0.01 10*3/uL (ref 0.00–0.07)
Basophils Absolute: 0.1 10*3/uL (ref 0.0–0.1)
Basophils Relative: 1 %
Eosinophils Absolute: 0.2 10*3/uL (ref 0.0–0.5)
Eosinophils Relative: 2 %
HCT: 46.4 % (ref 39.0–52.0)
Hemoglobin: 16.2 g/dL (ref 13.0–17.0)
Immature Granulocytes: 0 %
Lymphocytes Relative: 61 %
Lymphs Abs: 3.9 10*3/uL (ref 0.7–4.0)
MCH: 31 pg (ref 26.0–34.0)
MCHC: 34.9 g/dL (ref 30.0–36.0)
MCV: 88.9 fL (ref 80.0–100.0)
Monocytes Absolute: 0.4 10*3/uL (ref 0.1–1.0)
Monocytes Relative: 6 %
Neutro Abs: 1.9 10*3/uL (ref 1.7–7.7)
Neutrophils Relative %: 30 %
Platelets: 352 10*3/uL (ref 150–400)
RBC: 5.22 MIL/uL (ref 4.22–5.81)
RDW: 12.6 % (ref 11.5–15.5)
WBC: 6.4 10*3/uL (ref 4.0–10.5)
nRBC: 0 % (ref 0.0–0.2)

## 2022-07-03 LAB — URINALYSIS, ROUTINE W REFLEX MICROSCOPIC
Bilirubin Urine: NEGATIVE
Glucose, UA: NEGATIVE mg/dL
Hgb urine dipstick: NEGATIVE
Ketones, ur: NEGATIVE mg/dL
Leukocytes,Ua: NEGATIVE
Nitrite: NEGATIVE
Protein, ur: NEGATIVE mg/dL
Specific Gravity, Urine: 1.006 (ref 1.005–1.030)
pH: 6 (ref 5.0–8.0)

## 2022-07-03 LAB — MAGNESIUM: Magnesium: 2.1 mg/dL (ref 1.7–2.4)

## 2022-07-03 LAB — COMPREHENSIVE METABOLIC PANEL
ALT: 20 U/L (ref 0–44)
AST: 23 U/L (ref 15–41)
Albumin: 4 g/dL (ref 3.5–5.0)
Alkaline Phosphatase: 59 U/L (ref 38–126)
Anion gap: 8 (ref 5–15)
BUN: 11 mg/dL (ref 6–20)
CO2: 26 mmol/L (ref 22–32)
Calcium: 10 mg/dL (ref 8.9–10.3)
Chloride: 107 mmol/L (ref 98–111)
Creatinine, Ser: 0.99 mg/dL (ref 0.61–1.24)
GFR, Estimated: >60 mL/min (ref >60)
Glucose, Bld: 106 mg/dL — ABNORMAL HIGH (ref 70–99)
Potassium: 4.2 mmol/L (ref 3.5–5.1)
Sodium: 141 mmol/L (ref 135–145)
Total Bilirubin: 0.4 mg/dL (ref 0.3–1.2)
Total Protein: 7 g/dL (ref 6.5–8.1)

## 2022-07-03 LAB — LIPID PANEL
Cholesterol: 232 mg/dL — ABNORMAL HIGH (ref 0–200)
HDL: 38 mg/dL — ABNORMAL LOW (ref >40)
LDL Cholesterol: 141 mg/dL — ABNORMAL HIGH (ref 0–99)
Total CHOL/HDL Ratio: 6.1 RATIO
Triglycerides: 265 mg/dL — ABNORMAL HIGH (ref <150)
VLDL: 53 mg/dL — ABNORMAL HIGH (ref 0–40)

## 2022-07-03 LAB — POCT URINE DRUG SCREEN - MANUAL ENTRY (I-SCREEN)
POC Amphetamine UR: NOT DETECTED
POC Buprenorphine (BUP): NOT DETECTED
POC Cocaine UR: NOT DETECTED
POC Marijuana UR: NOT DETECTED
POC Methadone UR: NOT DETECTED
POC Methamphetamine UR: NOT DETECTED
POC Morphine: NOT DETECTED
POC Oxazepam (BZO): POSITIVE — AB
POC Oxycodone UR: NOT DETECTED
POC Secobarbital (BAR): NOT DETECTED

## 2022-07-03 LAB — HEMOGLOBIN A1C
Hgb A1c MFr Bld: 5.2 % (ref 4.8–5.6)
Mean Plasma Glucose: 102.54 mg/dL

## 2022-07-03 LAB — TSH: TSH: 1.299 u[IU]/mL (ref 0.350–4.500)

## 2022-07-03 LAB — ETHANOL: Alcohol, Ethyl (B): <10 mg/dL (ref <10)

## 2022-07-03 MED ORDER — TRAZODONE HCL 50 MG PO TABS: 50.0000 mg | ORAL_TABLET | Freq: Every evening | ORAL | Status: DC | PRN

## 2022-07-03 MED ORDER — ACETAMINOPHEN 325 MG PO TABS: 650.0000 mg | ORAL_TABLET | Freq: Four times a day (QID) | ORAL | Status: DC | PRN

## 2022-07-03 MED ORDER — OLANZAPINE 10 MG PO TABS
10.0000 mg | ORAL_TABLET | Freq: Every day | ORAL | Status: DC
  Filled 2022-07-03 (×3): qty 1

## 2022-07-03 MED ORDER — HYDROXYZINE HCL 25 MG PO TABS: 25.0000 mg | ORAL_TABLET | Freq: Three times a day (TID) | ORAL | Status: DC | PRN

## 2022-07-03 MED ORDER — ALUM & MAG HYDROXIDE-SIMETH 200-200-20 MG/5ML PO SUSP: 30.0000 mL | ORAL | Status: DC | PRN

## 2022-07-03 MED ORDER — OLANZAPINE 10 MG PO TABS: 10.0000 mg | ORAL_TABLET | Freq: Two times a day (BID) | ORAL | Status: DC

## 2022-07-03 MED ORDER — MAGNESIUM HYDROXIDE 400 MG/5ML PO SUSP: 30.0000 mL | Freq: Every day | ORAL | Status: DC | PRN

## 2022-07-03 MED ORDER — NAPROXEN SODIUM 550 MG PO TABS
550.0000 mg | ORAL_TABLET | Freq: Once | ORAL | Status: AC | PRN
  Administered 2022-07-04: 550 mg via ORAL
  Filled 2022-07-03: qty 1

## 2022-07-03 MED ORDER — DIVALPROEX SODIUM 500 MG PO DR TAB
500.0000 mg | DELAYED_RELEASE_TABLET | Freq: Every day | ORAL | Status: DC
  Filled 2022-07-03 (×3): qty 1

## 2022-07-03 NOTE — ED Notes (Addendum)
Continues to refuse blood work and urine. OOB attempting to call someone on phone gait is steady.

## 2022-07-03 NOTE — ED Notes (Signed)
Pt was given a sub, chips, and juice for lunch.  
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
## 2022-07-03 NOTE — ED Notes (Signed)
Pt is awake and alert.  He continues to have tangential pressured speech. He continues to take medications without much prompting.  Reports that he did not sleep very well and is asking to speak with the MD.  Staff will continue to monitor for safety.

## 2022-07-03 NOTE — Progress Notes (Signed)
   07/03/22 2115  Psych Admission Type (Psych Patients Only)  Admission Status Involuntary  Psychosocial Assessment  Patient Complaints Suspiciousness;Irritability  Eye Contact Brief  Facial Expression Anxious;Animated  Affect Preoccupied;Labile  Speech Tangential;Rapid;Pressured;Loud  Interaction Guarded;Defensive  Motor Activity Other (Comment) (wnl)  Appearance/Hygiene Unremarkable  Behavior Characteristics Resistant to care  Mood Labile  Thought Process  Coherency Disorganized  Content Blaming others;Religiosity;Paranoia;Preoccupation  Delusions Paranoid  Perception WDL  Hallucination None reported or observed  Judgment Impaired  Confusion None  Danger to Self  Current suicidal ideation? Denies (denies)  Danger to Others  Danger to Others None reported or observed

## 2022-07-03 NOTE — ED Notes (Signed)
Patient asleep.

## 2022-07-03 NOTE — Tx Team (Signed)
Initial Treatment Plan 07/03/2022 10:12 PM [name] MBT:[id number]    PATIENT STRESSORS: Medication change or noncompliance     PATIENT STRENGTHS: Average or above average intelligence    PATIENT IDENTIFIED PROBLEMS: Possible medication non-compliance  Psychosis  Delusional  Paranoia  (Pt doesn't want to work on anything)             DISCHARGE CRITERIA:  Adequate post-discharge living arrangements Improved stabilization in mood, thinking, and/or behavior Motivation to continue treatment in a less acute level of care Verbal commitment to aftercare and medication compliance  PRELIMINARY DISCHARGE PLAN: Outpatient therapy Return to previous living arrangement Return to previous work or school arrangements  PATIENT/FAMILY INVOLVEMENT: This treatment plan has been presented to and reviewed with the patient, Navin Dogan, and/or family member.  The patient and family have been given the opportunity to ask questions and make suggestions.  Victorino December, RN 07/03/2022, 10:12 PM

## 2022-07-03 NOTE — ED Notes (Signed)
[facility] called for transport to [facility] 

## 2022-07-03 NOTE — ED Notes (Signed)
Quinnlan is currently asleep no distress or disturbed sleep patterns noted

## 2022-07-03 NOTE — BH Assessment (Addendum)
LCSW Progress Note  LCSW provided several resources for Medicare recipients in need of outpatient therapy and psychiatric services.  EDP Princess Bruins, DO, has been notified.  Hansel Starling, MSW, LCSW [facility] [phone] or [phone]

## 2022-07-03 NOTE — ED Notes (Signed)
Pt is getting more agitated. he was given PRN medication apx 1 hr ago. He says he is supposed to be at work at [employer].  He has been told he is IVC'd but continues to ask to speak with a provider.  He was informed that providers are all in bed meeting and will speak with him shortly.

## 2022-07-03 NOTE — ED Notes (Signed)
Terrace in more awake , having rambling conversation with self saying "they tricked me to get me in here they aren't going to be able to trick me again". Continues to nap then wake up  for another few minutes of rambling conversation, but No verbal threats , no physical aggressive bx,and no threats to staff.

## 2022-07-03 NOTE — Progress Notes (Signed)
Inpatient Behavioral Health Placement   Pt meets inpatient criteria per Princess Bruins, DO. Referral was sent to the following facilities;   Destination Service Provider Address Phone Fax  [facility]  [address] [place] [address] [phone]  [facility]  [address] [place], [place] [address] [phone]  [facility]  [address] [place] [address] [phone]  [facility]  [address] [place], [place] [address] [phone]  [facility]  [address] [place] [address] [phone]  [facility]  [address] [place] [address] [phone]  [facility]  [address] [place] [address] [phone]  [facility]  [address], [place] [address] [phone]  [facility]  [address] [place] [address] [phone]  [facility]  [address] [place] [address] [phone]  [facility]  [address] [place] [address] [phone]  [facility]  [address] [place] [address] [phone]  [facility]  [address] [place] [address] [phone]    Situation ongoing,  CSW will follow up.   Maryjean Ka, MSW, [place] 07/03/2022  @ 3:48 PM

## 2022-07-03 NOTE — ED Notes (Signed)
Attempted to call report to [facility] staff unable to take due to nurses still being in report.
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
## 2022-07-03 NOTE — ED Notes (Signed)
Pt is aware of place and date.  He stated " I just came here to get my medications strait and you people kept me".  " I have to go to work. You have no idea how this sets me back (financially) to be here".   Pt took prn medication from RN. Awaiting provider's assessment for dispo.

## 2022-07-03 NOTE — ED Notes (Signed)
Report given to Garnet RN@[facility]  adult

## 2022-07-03 NOTE — Progress Notes (Signed)
Pt is being reviewed at [facility]. CSW and [facility] will follow.   Care Team notified: Perry Mount, RN, Bedelia Person, RN, [facility] , Wynelle Beckmann, Do, Regan Lemming, RN.  Maryjean Ka, MSW, [facility] 07/03/2022 2:51 PM
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
## 2022-07-03 NOTE — ED Provider Notes (Signed)
Behavioral Health Progress Note  Date and Time: 07/03/2022 11:51 AM Name: Lucas Siaerrance Bear MRN:  [id number]  Subjective:   Lucas Knapp is a 36 y.o. male, with PMH Schizophrenia, paranoid type (HCC) v Schizoaffective; Cannabis abuse; Tobacco use disorder; and Housing insecurity, who [facility] (07/[facility]/20[facility]) as a walk-in from home initially for [facility] with Shanna CiscoBrittney E Parsons, NP. However was referred downstairs for symptoms of mania in the setting of medication non-adherence.  After initial evaluation, the patient was requesting to go home, and was IVC'd (07/02/2022).   Patient is unable to give a clear and coherent history 2/2 mania.  See psych exam below. Patient stated, "I am good now, I want to go home.  I just came upstairs to get my medications refilled, but they lied and said that I was late when I was on time.  You can check the cameras.  I really need to get to work and I have not been in 2 days.  I do not know why you guys keep asking me about seeing things and hearing things, what I see and hear always there.  God speaks me through the intercom, and sometimes I see his face in the clouds."  In response to SI/HI, patient stated "the Bible tells us about it, we do not know our time, who reminded to say."  Despite reframing the question multiple times, patient was unable to give a clear answer to SI/HI, and continued to report hyper religious delusions about it.  Psych ROS: Sleep and appetite: Improved after restarting home Zyprexa yesterday.  Mania: increased energy despite decreased need for sleep (<2hrs/night) grandiose delusions, elated, distractible, grandiosity, flight of ideas, racing thoughts, pressured speech, and hyper-verbal Psychosis: AH (from God, unclear if command in nature), delusions (grandiose), and ideas of reference (from clouds of God thought)   ASSESSMENT/PLAN: Acute mania and psychosis 2/2  schizoaffective disorder Patient has not been adherent to home Zyprexa 5 mg every morning +10 mg qPM.  After restarting Zyprexa, patient's sleep and pressured speech improved some, although still difficult to interrupt.  Thought process still disorganized.  Continues to endorse symptoms of psychosis per above. Increased home Zyprexa to 10 mg twice daily We will consider starting a mood stabilizer, patient reported he was on Depakote in the past Reorder psych labs, patient continues to refuse Appreciate CSW for assistance with dispo, patient requires inpatient psych hospitalization - pending bed availability Continued IVC (by provider), renew on 07/09/2022  Diagnosis:  Final diagnoses:  Paranoid schizophrenia (HCC)  Cannabis abuse  Tobacco use disorder  Housing insecurity    Total Time spent with patient: 45 minutes  Past Psychiatric History: See H&P Past Medical History:  Past Medical History:  Diagnosis Date   Back pain    Bipolar 1 disorder (HCC)    Hyperlipidemia    Hypertension    pt has been prescribed HCTZ 12.5 mg daily. Pt was 140/80 on admission.    Schizophrenia (HCC)    History reviewed. No pertinent surgical history. Family History:  Family History  Problem Relation Age of Onset   Hypertension Father    Hyperlipidemia Father    Hypertension Paternal Grandmother    Hypertension Paternal Grandfather    Hypertension Brother        identical twin   Psychosis Brother        "mental issues"     Family Psychiatric  History: See H&P Social History:  Social History   Substance  and Sexual Activity  Alcohol Use Yes   Alcohol/week: 0.0 standard drinks of alcohol   Comment: occasionally --"I need to drink more"     Social History   Substance and Sexual Activity  Drug Use Yes   Types: Marijuana   Comment: Pt endorsed used of marijuana    Social History   Socioeconomic History   Marital status: Single    Spouse name: Not on file   Number of children: Not on  file   Years of education: Not on file   Highest education level: Not on file  Occupational History   Not on file  Tobacco Use   Smoking status: Every Day    Types: Cigarettes   Smokeless tobacco: Never   Tobacco comments:    10-15  Vaping Use   Vaping Use: Not on file  Substance and Sexual Activity   Alcohol use: Yes    Alcohol/week: 0.0 standard drinks of alcohol    Comment: occasionally --"I need to drink more"   Drug use: Yes    Types: Marijuana    Comment: Pt endorsed used of marijuana   Sexual activity: Not Currently  Other Topics Concern   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    Single   No children   Completed college (bachelors in Statistician)   Enjoys swimming, playing pool, walking   Grew up [place].  Has identical twin and 2 other brothers in [place]      06/2022: Homeless   Social Determinants of Health   Financial Resource Strain: Not on file  Food Insecurity: Not on file  Transportation Needs: Not on file  Physical Activity: Not on file  Stress: Not on file  Social Connections: Not on file   SDOH:  SDOH Screenings   Alcohol Screen: Low Risk  (12/21/2018)   Alcohol Screen    Last Alcohol Screening Score (AUDIT): 0  Depression (PHQ2-9): Low Risk  (09/09/2021)   Depression (PHQ2-9)    PHQ-2 Score: 3  Recent Concern: Depression (PHQ2-9) - Medium Risk (08/02/2021)   Depression (PHQ2-9)    PHQ-2 Score: 5  Financial Resource Strain: Not on file  Food Insecurity: Not on file  Housing: Not on file  Physical Activity: Not on file  Social Connections: Not on file  Stress: Not on file  Tobacco Use: High Risk (07/03/2022)   Patient History    Smoking Tobacco Use: Every Day    Smokeless Tobacco Use: Never    Passive Exposure: Not on file  Transportation Needs: Not on file   Additional Social History:    Pain Medications: see  MAR Prescriptions: see MAR Over the Counter: see MAR History of alcohol / drug use?: Yes (Pt's previous UDS indicates a history of cocaine use.) Longest period of sobriety (when/how long): Unknown Negative Consequences of Use:  (NA) Withdrawal Symptoms:  (NA) Name of Substance 1: cannabis 1 - Age of First Use: unknown 1 - Amount (size/oz): unknown 1 - Frequency: "a few times a week" 1 - Duration: ongoing 1 - Last Use / Amount: unknown 1 - Method of Aquiring: unknown 1- Route of Use: smoke                  Sleep: Fair  Appetite:  Fair  Current Medications:  Current Facility-Administered Medications  Medication Dose Route Frequency Provider Last Rate Last  Admin   acetaminophen (TYLENOL) tablet 650 mg  650 mg Oral Q6H PRN Rankin, Shuvon B, NP       alum & mag hydroxide-simeth (MAALOX/MYLANTA) 200-200-20 MG/5ML suspension 30 mL  30 mL Oral Q4H PRN Rankin, Shuvon B, NP       hydrOXYzine (ATARAX) tablet 25 mg  25 mg Oral TID PRN Rankin, Shuvon B, NP   25 mg at 07/03/22 0743   magnesium hydroxide (MILK OF MAGNESIA) suspension 30 mL  30 mL Oral Daily PRN Rankin, Shuvon B, NP       OLANZapine zydis (ZYPREXA) disintegrating tablet 10 mg  10 mg Oral Daily Rankin, Shuvon B, NP   10 mg at 07/03/22 0950   OLANZapine zydis (ZYPREXA) disintegrating tablet 5 mg  5 mg Oral Q8H PRN Sindy Guadeloupe, NP   5 mg at 07/03/22 0743   traZODone (DESYREL) tablet 50 mg  50 mg Oral QHS PRN Rankin, Shuvon B, NP       Current Outpatient Medications  Medication Sig Dispense Refill   divalproex (DEPAKOTE) 500 MG DR tablet Take 500 mg by mouth daily.     OLANZapine (ZYPREXA) 10 MG tablet Take 10 mg by mouth at bedtime.      Labs  Lab Results:  Admission on 07/02/2022  Component Date Value Ref Range Status   SARS Coronavirus 2 by RT PCR 07/02/2022 NEGATIVE  NEGATIVE Final   Comment: (NOTE) SARS-CoV-2 target nucleic acids are NOT DETECTED.  The SARS-CoV-2 RNA is generally detectable in upper  respiratory specimens during the acute phase of infection. The lowest concentration of SARS-CoV-2 viral copies this assay can detect is 138 copies/mL. A negative result does not preclude SARS-Cov-2 infection and should not be used as the sole basis for treatment or other patient management decisions. A negative result may occur with  improper specimen collection/handling, submission of specimen other than nasopharyngeal swab, presence of viral mutation(s) within the areas targeted by this assay, and inadequate number of viral copies(<138 copies/mL). A negative result must be combined with clinical observations, patient history, and epidemiological information. The expected result is Negative.  Fact Sheet for Patients:  BloggerCourse.com  Fact Sheet for Healthcare Providers:  SeriousBroker.it  This test is no                          t yet approved or cleared by the [place] FDA and  has been authorized for detection and/or diagnosis of SARS-CoV-2 by FDA under an Emergency Use Authorization (EUA). This EUA will remain  in effect (meaning this test can be used) for the duration of the COVID-19 declaration under Section 564(b)(1) of the Act, 21 U.S.C.section 360bbb-3(b)(1), unless the authorization is terminated  or revoked sooner.       Influenza A by PCR 07/02/2022 NEGATIVE  NEGATIVE Final   Influenza B by PCR 07/02/2022 NEGATIVE  NEGATIVE Final   Comment: (NOTE) The Xpert Xpress SARS-CoV-2/FLU/RSV plus assay is intended as an aid in the diagnosis of influenza from Nasopharyngeal swab specimens and should not be used as a sole basis for treatment. Nasal washings and aspirates are unacceptable for Xpert Xpress SARS-CoV-2/FLU/RSV testing.  Fact Sheet for Patients: BloggerCourse.com  Fact Sheet for Healthcare Providers: SeriousBroker.it  This test is not yet approved or  cleared by the [place] FDA and has been authorized for detection and/or diagnosis of SARS-CoV-2 by FDA under an Emergency Use Authorization (EUA). This EUA will remain in effect (meaning this test  can be used) for the duration of the COVID-19 declaration under Section 564(b)(1) of the Act, 21 U.S.C. section 360bbb-3(b)(1), unless the authorization is terminated or revoked.  Performed at [facility], [address], [place], [place] [address]    POC Amphetamine UR 07/03/2022 None Detected  NONE DETECTED (Cut Off Level 1000 ng/mL) Final   POC Secobarbital (BAR) 07/03/2022 None Detected  NONE DETECTED (Cut Off Level 300 ng/mL) Final   POC Buprenorphine (BUP) 07/03/2022 None Detected  NONE DETECTED (Cut Off Level 10 ng/mL) Final   POC Oxazepam (BZO) 07/03/2022 Positive (A)  NONE DETECTED (Cut Off Level 300 ng/mL) Final   was given prn at[facility] 7/13 at [facility]   POC Cocaine UR 07/03/2022 None Detected  NONE DETECTED (Cut Off Level 300 ng/mL) Final   POC Methamphetamine UR 07/03/2022 None Detected  NONE DETECTED (Cut Off Level 1000 ng/mL) Final   POC Morphine 07/03/2022 None Detected  NONE DETECTED (Cut Off Level 300 ng/mL) Final   POC Methadone UR 07/03/2022 None Detected  NONE DETECTED (Cut Off Level 300 ng/mL) Final   POC Oxycodone UR 07/03/2022 None Detected  NONE DETECTED (Cut Off Level 100 ng/mL) Final   POC Marijuana UR 07/03/2022 None Detected  NONE DETECTED (Cut Off Level 50 ng/mL) Final   SARSCOV2ONAVIRUS 2 AG 07/02/2022 NEGATIVE  NEGATIVE Final   Comment: (NOTE) SARS-CoV-2 antigen NOT DETECTED.   Negative results are presumptive.  Negative results do not preclude SARS-CoV-2 infection and should not be used as the sole basis for treatment or other patient management decisions, including infection  control decisions, particularly in the presence of clinical signs and  symptoms consistent with COVID-19, or in those who have been in contact with the virus.  Negative results  must be combined with clinical observations, patient history, and epidemiological information. The expected result is Negative.  Fact Sheet for Patients: https://www.jennings-kim.com/  Fact Sheet for Healthcare Providers: https://alexander-rogers.biz/  This test is not yet approved or cleared by the [place] FDA and  has been authorized for detection and/or diagnosis of SARS-CoV-2 by FDA under an Emergency Use Authorization (EUA).  This EUA will remain in effect (meaning this test can be used) for the duration of  the COV                          ID-19 declaration under Section 564(b)(1) of the Act, 21 U.S.C. section 360bbb-3(b)(1), unless the authorization is terminated or revoked sooner.    Admission on 04/17/2022, Discharged on 04/19/2022  Component Date Value Ref Range Status   WBC 04/17/2022 6.2  4.0 - 10.5 K/uL Final   RBC 04/17/2022 4.65  4.22 - 5.81 MIL/uL Final   Hemoglobin 04/17/2022 14.5  13.0 - 17.0 g/dL Final   HCT 10/93/2355 42.5  39.0 - 52.0 % Final   MCV 04/17/2022 91.4  80.0 - 100.0 fL Final   MCH 04/17/2022 31.2  26.0 - 34.0 pg Final   MCHC 04/17/2022 34.1  30.0 - 36.0 g/dL Final   RDW 73/22/0254 12.8  11.5 - 15.5 % Final   Platelets 04/17/2022 320  150 - 400 K/uL Final   nRBC 04/17/2022 0.0  0.0 - 0.2 % Final   Neutrophils Relative % 04/17/2022 39  % Final   Neutro Abs 04/17/2022 2.5  1.7 - 7.7 K/uL Final   Lymphocytes Relative 04/17/2022 50  % Final   Lymphs Abs 04/17/2022 3.1  0.7 - 4.0 K/uL Final   Monocytes  Relative 04/17/2022 8  % Final   Monocytes Absolute 04/17/2022 0.5  0.1 - 1.0 K/uL Final   Eosinophils Relative 04/17/2022 2  % Final   Eosinophils Absolute 04/17/2022 0.1  0.0 - 0.5 K/uL Final   Basophils Relative 04/17/2022 1  % Final   Basophils Absolute 04/17/2022 0.1  0.0 - 0.1 K/uL Final   Immature Granulocytes 04/17/2022 0  % Final   Abs Immature Granulocytes 04/17/2022 0.01  0.00 - 0.07 K/uL Final   Performed at  [facility], [address], [place], [place] [address]   Sodium 04/17/2022 137  135 - 145 mmol/L Final   Potassium 04/17/2022 3.7  3.5 - 5.1 mmol/L Final   Chloride 04/17/2022 106  98 - 111 mmol/L Final   CO2 04/17/2022 23  22 - 32 mmol/L Final   Glucose, Bld 04/17/2022 90  70 - 99 mg/dL Final   Glucose reference range applies only to samples taken after fasting for at least 8 hours.   BUN 04/17/2022 13  6 - 20 mg/dL Final   Creatinine, Ser 04/17/2022 0.96  0.61 - 1.24 mg/dL Final   Calcium 02/63/7858 9.0  8.9 - 10.3 mg/dL Final   Total Protein 85/01/7740 7.0  6.5 - 8.1 g/dL Final   Albumin 28/78/6767 4.1  3.5 - 5.0 g/dL Final   AST 20/94/7096 58 (H)  15 - 41 U/L Final   ALT 04/17/2022 44  0 - 44 U/L Final   Alkaline Phosphatase 04/17/2022 63  38 - 126 U/L Final   Total Bilirubin 04/17/2022 0.8  0.3 - 1.2 mg/dL Final   GFR, Estimated 04/17/2022 >60  >60 mL/min Final   Comment: (NOTE) Calculated using the CKD-EPI Creatinine Equation (2021)    Anion gap 04/17/2022 8  5 - 15 Final   Performed at [facility], [address], [place], [place] [address]   Salicylate Lvl 04/17/2022 <7.0 (L)  7.0 - 30.0 mg/dL Final   Performed at [facility], [address] [place], [place] [address]   Acetaminophen (Tylenol), Serum 04/17/2022 <10 (L)  10 - 30 ug/mL Final   Comment: (NOTE) Therapeutic concentrations vary significantly. A range of 10-30 ug/mL  may be an effective concentration for many patients. However, some  are best treated at concentrations outside of this range. Acetaminophen concentrations >150 ug/mL at 4 hours after ingestion  and >50 ug/mL at 12 hours after ingestion are often associated with  toxic reactions.  Performed at [facility], [address], [place], [place] [address]    Alcohol, Ethyl (B) 04/17/2022 <10  <10 mg/dL Final   Comment: (NOTE) Lowest detectable limit for serum alcohol is 10  mg/dL.  For medical purposes only. Performed at [facility], [address] [place], [place] [address]    SARS Coronavirus 2 by RT PCR 04/17/2022 NEGATIVE  NEGATIVE Final   Comment: (NOTE) SARS-CoV-2 target nucleic acids are NOT DETECTED.  The SARS-CoV-2 RNA is generally detectable in upper respiratory specimens during the acute phase of infection. The lowest concentration of SARS-CoV-2 viral copies this assay can detect is 138 copies/mL. A negative result does not preclude SARS-Cov-2 infection and should not be used as the sole basis for treatment or other patient management decisions. A negative result may occur with  improper specimen collection/handling, submission of specimen other than nasopharyngeal swab, presence of viral mutation(s) within the areas targeted by this assay, and inadequate number of viral copies(<138 copies/mL). A negative result  must be combined with clinical observations, patient history, and epidemiological information. The expected result is Negative.  Fact Sheet for Patients:  BloggerCourse.com  Fact Sheet for Healthcare Providers:  SeriousBroker.it  This test is no                          t yet approved or cleared by the [place] FDA and  has been authorized for detection and/or diagnosis of SARS-CoV-2 by FDA under an Emergency Use Authorization (EUA). This EUA will remain  in effect (meaning this test can be used) for the duration of the COVID-19 declaration under Section 564(b)(1) of the Act, 21 U.S.C.section 360bbb-3(b)(1), unless the authorization is terminated  or revoked sooner.       Influenza A by PCR 04/17/2022 NEGATIVE  NEGATIVE Final   Influenza B by PCR 04/17/2022 NEGATIVE  NEGATIVE Final   Comment: (NOTE) The Xpert Xpress SARS-CoV-2/FLU/RSV plus assay is intended as an aid in the diagnosis of influenza from Nasopharyngeal swab specimens and should not be  used as a sole basis for treatment. Nasal washings and aspirates are unacceptable for Xpert Xpress SARS-CoV-2/FLU/RSV testing.  Fact Sheet for Patients: BloggerCourse.com  Fact Sheet for Healthcare Providers: SeriousBroker.it  This test is not yet approved or cleared by the [place] FDA and has been authorized for detection and/or diagnosis of SARS-CoV-2 by FDA under an Emergency Use Authorization (EUA). This EUA will remain in effect (meaning this test can be used) for the duration of the COVID-19 declaration under Section 564(b)(1) of the Act, 21 U.S.C. section 360bbb-3(b)(1), unless the authorization is terminated or revoked.  Performed at [facility], [address], [place], [place] [address]    Opiates 04/18/2022 NONE DETECTED  NONE DETECTED Final   Cocaine 04/18/2022 NONE DETECTED  NONE DETECTED Final   Benzodiazepines 04/18/2022 NONE DETECTED  NONE DETECTED Final   Amphetamines 04/18/2022 NONE DETECTED  NONE DETECTED Final   Tetrahydrocannabinol 04/18/2022 POSITIVE (A)  NONE DETECTED Final   Barbiturates 04/18/2022 NONE DETECTED  NONE DETECTED Final   Comment: (NOTE) DRUG SCREEN FOR MEDICAL PURPOSES ONLY.  IF CONFIRMATION IS NEEDED FOR ANY PURPOSE, NOTIFY LAB WITHIN 5 DAYS.  LOWEST DETECTABLE LIMITS FOR URINE DRUG SCREEN Drug Class                     Cutoff (ng/mL) Amphetamine and metabolites    1000 Barbiturate and metabolites    200 Benzodiazepine                 200 Tricyclics and metabolites     300 Opiates and metabolites        300 Cocaine and metabolites        300 THC                            50 Performed at [facility], [address], [place], [place] [address]    Cholesterol 04/17/2022 159  0 - 200 mg/dL Final   Triglycerides 09/81/1914 91  <150 mg/dL Final   HDL 78/29/5621 50  >40 mg/dL Final   Total CHOL/HDL Ratio 04/17/2022 3.2  RATIO Final   VLDL  04/17/2022 18  0 - 40 mg/dL Final   LDL Cholesterol 04/17/2022 91  0 - 99 mg/dL Final   Comment:        Total Cholesterol/HDL:CHD Risk Coronary Heart Disease Risk Table  Men   Women  1/2 Average Risk   3.4   3.3  Average Risk       5.0   4.4  2 X Average Risk   9.6   7.1  3 X Average Risk  23.4   11.0        Use the calculated Patient Ratio above and the CHD Risk Table to determine the patient's CHD Risk.        ATP III CLASSIFICATION (LDL):  <100     mg/dL   Optimal  983-382  mg/dL   Near or Above                    Optimal  130-159  mg/dL   Borderline  505-397  mg/dL   High  >673     mg/dL   Very High Performed at [facility], [address], [place], [place] [address]     Blood Alcohol level:  Lab Results  Component Value Date   ETH <10 04/17/2022   ETH <10 12/23/2019    Metabolic Disorder Labs: Lab Results  Component Value Date   HGBA1C 5.0 12/22/2018   MPG 96.8 12/22/2018   MPG 99.67 07/29/2018   Lab Results  Component Value Date   PROLACTIN 30.0 (H) 07/29/2018   Lab Results  Component Value Date   CHOL 159 04/17/2022   TRIG 91 04/17/2022   HDL 50 04/17/2022   CHOLHDL 3.2 04/17/2022   VLDL 18 04/17/2022   LDLCALC 91 04/17/2022   LDLCALC 112 (H) 12/22/2018    Therapeutic Lab Levels: No results found for: "LITHIUM" No results found for: "VALPROATE" Lab Results  Component Value Date   CBMZ <0.5 (L) 10/09/2012   CBMZ 2.2 (L) 09/21/2012    Physical Findings   AIMS    Flowsheet Row Clinical Support from 09/09/2021 in [facility]) [facility]) [facility]  AIMS Total Score 0 0 0      AUDIT    Flowsheet Row [facility]) [facility] (Dis[facility]  Alcohol Use Disorder Identification Test Final Score (AUDIT) 0 0      GAD-7    Flowsheet Row Clinical Support from 09/09/2021 in [facility] Video Visit from 08/02/2021 in [facility] Video Visit from 05/02/2021 in [facility] Video Visit from 02/04/2021 in [facility]  Total GAD-7 Score 7 6 1 2       PHQ2-9    Flowsheet Row Clinical Support from 09/09/2021 in [facility] Video Visit from 08/02/2021 in [facility] Video Visit from 05/02/2021 in [facility] Video Visit from 02/04/2021 in [facility] Office Visit from 06/28/2018 in [facility]  PHQ-2 Total Score 0 0 0 0 6  PHQ-9 Total Score 3 5 0 0 27      Flowsheet Row ED from 07/02/2022 in [facility] ED from 04/17/2022 in [facility] Clinical Support from 09/09/2021 in [facility]  C-SSRS RISK CATEGORY Error: Question 6 not populated No Risk Error: Q7 should not be populated when Q6 is No  Musculoskeletal  Strength & Muscle Tone: within normal limits Gait & Station: normal Patient leans: N/A  Psychiatric Specialty Exam  Presentation  General Appearance: Fairly Groomed (Initially seen wrapped in blankets, resting in bed comfortable)   Eye Contact:Fair (Minimal blinking. Was not shifting or darting.)   Speech:Clear and Coherent; Pressured (Fast speed.  Interruptible, although difficult.  Hyperverbal)   Speech Volume:Increased   Handedness:Right    Mood and Affect  Mood:-- (" All right")   Affect:Flat; Other (comment) (Appeared elated)    Thought Process  Thought Processes:Irrevelant;  Disorganized   Descriptions of Associations:Loose   Orientation:Other (comment) (Patient declined to answer)   Thought Content:Delusions; Illogical; Paranoid Ideation; Perseveration; Rumination; Scattered (Grandiose delusions and hyperreligious and paranoid, perseverates on needing to go to work and an accident that happened in the early 2000.  Ruminates on the day prior to admission.  Paranoid that people are watching him through cameras.)   Diagnosis of Schizophrenia or Schizoaffective disorder in past: Yes     Hallucinations:Hallucinations: Auditory; Visual Description of Auditory Hallucinations: God speaking to him over the intercom Description of Visual Hallucinations: God's face in the clouds, especially the grocery store   Ideas of Reference:Delusions; Other (comment) (Ideas of reference from clouds of God's thoughts)   Suicidal Thoughts:Suicidal Thoughts: -- (Suspect SI given patient will not answer question directly)   Homicidal Thoughts:Homicidal Thoughts: -- (Suspect HI given patient will not answer question directly)    Sensorium  Memory:Immediate Good   Judgment:Impaired   Insight:Lacking (Unable to reality test, believe that God talking to him is real)    Art therapist  Concentration:Fair   Attention Span:Fair   Recall:Good   Fund of Knowledge:Fair   Language:Good    Psychomotor Activity  Psychomotor Activity:Psychomotor Activity: Normal   Assets  Assets:Communication Skills; Desire for Improvement    Sleep  Sleep:Sleep: Fair   Nutritional Assessment (For OBS and FBC admissions only) Has the patient had a weight loss or gain of 10 pounds or more in the last 3 months?: No Has the patient had a decrease in food intake/or appetite?: No Does the patient have dental problems?: No Does the patient have eating habits or behaviors that may be indicators of an eating disorder including binging or inducing vomiting?: No Has the  patient recently lost weight without trying?: 0 Has the patient been eating poorly because of a decreased appetite?: 0 Malnutrition Screening Tool Score: 0    Physical Exam  Physical Exam Vitals and nursing note reviewed.  Constitutional:      General: He is not in acute distress.    Appearance: He is not ill-appearing or diaphoretic.  HENT:     Head: Normocephalic and atraumatic.  Pulmonary:     Effort: Pulmonary effort is normal. No respiratory distress.  Neurological:     General: No focal deficit present.     Mental Status: He is alert.    Review of Systems  Constitutional:  Negative for fever.  Respiratory:  Negative for shortness of breath.   Cardiovascular:  Negative for chest pain.  Gastrointestinal:  Negative for nausea and vomiting.  Neurological:  Negative for dizziness and headaches.   Blood pressure 121/76, pulse 77, temperature 98.8 F (37.1 C), temperature source Oral, resp. rate 18, SpO2 98 %. There is no height or weight on file to calculate BMI.  Treatment Plan Summary: Daily contact with patient to assess and evaluate symptoms and progress in treatment and Medication management  Princess Bruins, DO 07/03/2022 11:51 AM

## 2022-07-03 NOTE — Discharge Instructions (Addendum)
To see which pharmacy near you is the CHEAPEST for certain medications, please use GoodRx. It is free website and has a free phone app.      Also consider looking at [facility] $4.00 or [facility] $7.00 prescription list. Both are free to view if googled [facility]" and "[facility] $7 prescription". These are set prices, no insurance required.   For issues with sleep, please use this free app for insomnia called CBT-I. Let your doctors and therapists know so they can help with extra tips and tricks or for guidance and accountability. NO ADDS on the app.     For therapy outside the hospital, please ask for these specific types of therapy: CBT   Please make regular appointments with an outpatient psychiatrist and other doctors once you leave the hospital.   Suicide hotline: 988 Emergency: 911   Good afternoon!  Based on what you have shared with [place] and what has been observed while you were in observation, it would be beneficial for you to continue with seeing Toy Cookey, NP, PMHNP at the [facility] on [address] [place], [place], [address].  Be sure to make an appointment with your psychiatric provider no later than 7 days from being discharged.  Other resources for outpatient therapy are provided below to get you started on finding a therapist who will meet your needs.   In case of an urgent emergency, you have the option of contacting the Mobile Crisis Unit with [facility] at [phone].   Outpatient Therapy and Psychiatry for Medicare Recipients  [facility] [address] [place], [place], [address] [phone] phone  [facility] [address], [address] [place], [place], [address] [phone] phone  [facility] [address] [place], [place], [address] [phone] phone  [facility] [address] [place], [place],  [address] [phone] phone [phone] fax  [facility] [place] [address], [place], [address] [phone] phone  [facility] [address] [place], [place], [address] [phone] phone [phone] fax  [facility] [address] [place], [place], [address] [phone] phone  [facility] Kussmaul [address] [place], [place], [address] [phone] phone  [facility] [address], [place], [address] [phone] phone [phone] fax

## 2022-07-04 ENCOUNTER — Encounter (HOSPITAL_COMMUNITY): Payer: Self-pay

## 2022-07-04 DIAGNOSIS — F201 Disorganized schizophrenia: Secondary | ICD-10-CM

## 2022-07-04 MED ORDER — LORAZEPAM 2 MG/ML IJ SOLN: 2.0000 mg | Freq: Four times a day (QID) | INTRAMUSCULAR | Status: DC | PRN

## 2022-07-04 MED ORDER — LORAZEPAM 2 MG/ML IJ SOLN
1.0000 mg | Freq: Two times a day (BID) | INTRAMUSCULAR | Status: DC
  Filled 2022-07-04: qty 1

## 2022-07-04 MED ORDER — NAPROXEN 250 MG PO TABS
250.0000 mg | ORAL_TABLET | Freq: Four times a day (QID) | ORAL | Status: DC | PRN
  Administered 2022-07-04 – 2022-07-09 (×5): 250 mg via ORAL
  Filled 2022-07-04 (×5): qty 1

## 2022-07-04 MED ORDER — DIPHENHYDRAMINE HCL 25 MG PO CAPS: 50.0000 mg | ORAL_CAPSULE | Freq: Four times a day (QID) | ORAL | Status: DC | PRN

## 2022-07-04 MED ORDER — BENZTROPINE MESYLATE 1 MG/ML IJ SOLN
1.0000 mg | Freq: Two times a day (BID) | INTRAMUSCULAR | Status: DC
  Filled 2022-07-04 (×7): qty 1
  Filled 2022-07-04: qty 2
  Filled 2022-07-04 (×5): qty 1

## 2022-07-04 MED ORDER — BENZTROPINE MESYLATE 1 MG PO TABS
1.0000 mg | ORAL_TABLET | Freq: Two times a day (BID) | ORAL | Status: DC
  Administered 2022-07-04 – 2022-07-08 (×8): 1 mg via ORAL
  Filled 2022-07-04 (×14): qty 1

## 2022-07-04 MED ORDER — NICOTINE 21 MG/24HR TD PT24
21.0000 mg | MEDICATED_PATCH | Freq: Every day | TRANSDERMAL | Status: DC
Start: 2022-07-04 — End: 2022-07-06
  Filled 2022-07-04 (×4): qty 1

## 2022-07-04 MED ORDER — LORAZEPAM 1 MG PO TABS
1.0000 mg | ORAL_TABLET | Freq: Two times a day (BID) | ORAL | Status: DC
  Administered 2022-07-04 – 2022-07-08 (×8): 1 mg via ORAL
  Filled 2022-07-04 (×8): qty 1

## 2022-07-04 MED ORDER — LORAZEPAM 1 MG PO TABS: 2.0000 mg | ORAL_TABLET | Freq: Four times a day (QID) | ORAL | Status: DC | PRN

## 2022-07-04 MED ORDER — HALOPERIDOL 5 MG PO TABS: 5.0000 mg | ORAL_TABLET | Freq: Three times a day (TID) | ORAL | Status: DC | PRN

## 2022-07-04 MED ORDER — HALOPERIDOL LACTATE 5 MG/ML IJ SOLN: 5.0000 mg | Freq: Three times a day (TID) | INTRAMUSCULAR | Status: DC | PRN

## 2022-07-04 MED ORDER — DIPHENHYDRAMINE HCL 50 MG/ML IJ SOLN: 50.0000 mg | Freq: Four times a day (QID) | INTRAMUSCULAR | Status: DC | PRN

## 2022-07-04 MED ORDER — HALOPERIDOL LACTATE 5 MG/ML IJ SOLN
5.0000 mg | Freq: Two times a day (BID) | INTRAMUSCULAR | Status: DC
  Filled 2022-07-04 (×13): qty 1

## 2022-07-04 MED ORDER — HALOPERIDOL 5 MG PO TABS
5.0000 mg | ORAL_TABLET | Freq: Two times a day (BID) | ORAL | Status: DC
  Administered 2022-07-04 – 2022-07-08 (×8): 5 mg via ORAL
  Filled 2022-07-04 (×14): qty 1

## 2022-07-04 NOTE — Group Note (Signed)
LCSW Group Therapy Note   Group Date: 07/04/2022 Start Time: 1100 End Time: 1200   Type of Therapy and Topic:  Group Therapy: Coping Skills  Participation Level:  Active  Due to the acuity and complex discharge plans, group was not held. Patient was provided therapeutic worksheets and asked to meet with CSW as needed.  Maddix Kliewer M Delane Stalling, LCSWA 07/04/2022  2:02 PM    

## 2022-07-04 NOTE — Progress Notes (Addendum)
Patient presents with a labile mood, tangential and rapid speech. He refused his am medication stating they cause him pain. MD made aware and force medication ordered. Patient was later given an option to take PO or Injection, in which he opted for PO. Staff watched patient swallowed medication.Within 5 minutes of taking medication, patient approached the medication window stating "I want pain medicine, the medication you all gave me in making me to have pain." Naproxen 250 mg administered to patient at 1643. Patient is now placed on unit restriction due paranoia, tangential and rapid speech. Patient mentioned in his speech; " You'll want to bewitch me, and put me in a coffin."  "Making me feel like a cursed generation."  Patient tolerated medication well with no side effect noted. Staff will continue to monitor and support patient.  07/04/22 0800  Psych Admission Type (Psych Patients Only)  Admission Status Involuntary  Psychosocial Assessment  Patient Complaints Suspiciousness;Anger;Agitation;Anxiety  Eye Contact Avertive  Facial Expression Anxious;Animated  Affect Preoccupied  Speech Tangential;Rapid  Interaction Guarded;Defensive  Motor Activity Other (Comment) (wnl)  Appearance/Hygiene Unremarkable  Behavior Characteristics Resistant to care  Mood Labile  Aggressive Behavior  Effect No apparent injury  Thought Process  Coherency Tangential;Disorganized  Content Blaming others;Paranoia;Preoccupation  Delusions Paranoid  Perception WDL  Hallucination None reported or observed  Judgment Impaired  Confusion None  Danger to Self  Current suicidal ideation? Denies  Agreement Not to Harm Self Yes  Description of Agreement verbal  Danger to Others  Danger to Others None reported or observed

## 2022-07-04 NOTE — Progress Notes (Signed)
   07/04/22 0534  Sleep  Number of Hours 3.75

## 2022-07-04 NOTE — Progress Notes (Signed)
  Reason for the Medication: The patient, without the benefit of the specific treatment measure, is incapable of participating in any available treatment plan that will give the patient a realistic opportunity of improving the patient's condition.   Consideration of Side Effects: Consideration of the side effects related to the medication plan has been given.   Rationale for Medication Administration: Patient has shown improvement in past hospitalizations with medication management.  He decompensates after discharge when he becomes medication compliant.    -----   At the present time, patient has no capacity to understand the need for treatment, refusing to take psychotropic medication, because of poor insight and judgment.  Patient states that they have no problem, no mental illness and refuses to consider taking medication, recommended by the psychiatrist Dr Lucianne Muss.  When evaluated patient with rambling speech disorganized thought process, paranoid and delusional, flight of ideas noted notes he needs to regarding the sedation and case study "I want to get the case is starting and meet with people who rates the medicine chemist and physics" he is hard to redirect, refusing to comply with medications in the hospital, poor insight and judgment regarding severity of his mental illness and need for medication treatment.  Diagnosis: schizophrenia   Treatment plan/Opinion: -patient is mentally ill, as needs hospitalization -patient continues to display active symptoms of paranoia and delusions [mental illness] -patient lacks the capacity to consent to treatment with medication despite history of improvement and see medication. -patient is unable to rationally discuss medication options, risks versus benefit due to their symptoms. -treatment with medication and medication changes would be in their best interest -psychotropics are effective treatment for symptoms of psychosis   Based on my evaluation,  the following medications are recommend and indicated for treatment of the patient's psychiatric diagnosis and symptoms: Scheduled Zyprexa and Depakote for mood and psychosis, as needed Haldol/Ativan/Benadryl for psychotic agitation.

## 2022-07-04 NOTE — Progress Notes (Signed)
Pt was encouraged but didn't attend orientation/goals group. ?

## 2022-07-04 NOTE — Group Note (Signed)
Recreation Therapy Group Note   Group Topic:Other  Group Date: 07/04/2022 Start Time: 1000 End Time: 1040 Facilitators: [name], LRT,CTRS Location: 500 [address] Dayroom   Goal Area(s) Addresses:  Patient will successfully identify positive attributes about themselves.  Patient will identify healthy ways to increase self-expression Patient will acknowledge benefit(s) of expressing self in a positive manner.   Group Description:  TOTIKA.  This game consists of blocks of different colors (orange, blue, red and green).  It is also played just like [place]  Patients will pull a block from the tower and stack it on top of the tower.  Whatever color is picked, LRT will ask patient a question that corresponds with that particular color.  Patients will continue to play the game as instructed until the tower falls down.  Once the tower falls, it will be rebuilt and patients will play another round.   Affect/Mood: N/A   Participation Level: Did not attend    Clinical Observations/Individualized Feedback:     Plan: Continue to engage patient in RT group sessions 2-3x/week.   [name], LRT,CTRS 07/04/2022 1:38 PM

## 2022-07-04 NOTE — BH IP Treatment Plan (Signed)
Interdisciplinary Treatment and Diagnostic Plan Update  07/04/2022 Time of Session: 1000 Lucas Knapp MRN: [id number]  Principal Diagnosis: Schizophrenia [facility])  Secondary Diagnoses: Principal Problem:   Schizophrenia (HCC)   Current Medications:  Current Facility-Administered Medications  Medication Dose Route Frequency Provider Last Rate Last Admin   alum & mag hydroxide-simeth (MAALOX/MYLANTA) 200-200-20 MG/5ML suspension 30 mL  30 mL Oral Q4H PRN Sindy Guadeloupe, NP       diphenhydrAMINE (BENADRYL) capsule 50 mg  50 mg Oral Q6H PRN Carrion-Carrero, Margely, MD       Or   diphenhydrAMINE (BENADRYL) injection 50 mg  50 mg Intramuscular Q6H PRN Carrion-Carrero, Margely, MD       divalproex (DEPAKOTE) DR tablet 500 mg  500 mg Oral Daily Sindy Guadeloupe, NP       haloperidol (HALDOL) tablet 5 mg  5 mg Oral Q8H PRN Carrion-Carrero, Margely, MD       Or   haloperidol lactate (HALDOL) injection 5 mg  5 mg Intramuscular Q8H PRN Carrion-Carrero, Margely, MD       LORazepam (ATIVAN) tablet 2 mg  2 mg Oral Q6H PRN Carrion-Carrero, Margely, MD       Or   LORazepam (ATIVAN) injection 2 mg  2 mg Intramuscular Q6H PRN Carrion-Carrero, Margely, MD       magnesium hydroxide (MILK OF MAGNESIA) suspension 30 mL  30 mL Oral Daily PRN Sindy Guadeloupe, NP       naproxen (NAPROSYN) tablet 250 mg  250 mg Oral Q6H PRN Carlyn Reichert, MD       OLANZapine (ZYPREXA) tablet 10 mg  10 mg Oral Daily Sindy Guadeloupe, NP       traZODone (DESYREL) tablet 50 mg  50 mg Oral QHS PRN Sindy Guadeloupe, NP       PTA Medications: Medications Prior to Admission  Medication Sig Dispense Refill Last Dose   divalproex (DEPAKOTE) 500 MG DR tablet Take 500 mg by mouth daily.      OLANZapine (ZYPREXA) 10 MG tablet Take 1 tablet (10 mg total) by mouth in the morning and at bedtime.       Patient Stressors: Medication change or noncompliance    Patient Strengths: Average or above average intelligence   Treatment Modalities:  Medication Management, Group therapy, Case management,  1 to 1 session with clinician, Psychoeducation, Recreational therapy.   Physician Treatment Plan for Primary Diagnosis: Schizophrenia (HCC) Long Term Goal(s):     Short Term Goals:    Medication Management: Evaluate patient's response, side effects, and tolerance of medication regimen.  Therapeutic Interventions: 1 to 1 sessions, Unit Group sessions and Medication administration.  Evaluation of Outcomes: Not Progressing  Physician Treatment Plan for Secondary Diagnosis: Principal Problem:   Schizophrenia (HCC)  Long Term Goal(s):     Short Term Goals:       Medication Management: Evaluate patient's response, side effects, and tolerance of medication regimen.  Therapeutic Interventions: 1 to 1 sessions, Unit Group sessions and Medication administration.  Evaluation of Outcomes: Not Progressing   RN Treatment Plan for Primary Diagnosis: Schizophrenia (HCC) Long Term Goal(s): Knowledge of disease and therapeutic regimen to maintain health will improve  Short Term Goals: Ability to remain free from injury will improve, Ability to verbalize frustration and anger appropriately will improve, Ability to demonstrate self-control, Ability to participate in decision making will improve, Ability to verbalize feelings will improve, Ability to disclose and discuss suicidal ideas, Ability to identify and develop effective coping behaviors will improve, and Compliance with prescribed  medications will improve  Medication Management: RN will administer medications as ordered by provider, will assess and evaluate patient's response and provide education to patient for prescribed medication. RN will report any adverse and/or side effects to prescribing provider.  Therapeutic Interventions: 1 on 1 counseling sessions, Psychoeducation, Medication administration, Evaluate responses to treatment, Monitor vital signs and CBGs as ordered,  Perform/monitor CIWA, COWS, AIMS and Fall Risk screenings as ordered, Perform wound care treatments as ordered.  Evaluation of Outcomes: Not Progressing   LCSW Treatment Plan for Primary Diagnosis: Schizophrenia (HCC) Long Term Goal(s): Safe transition to appropriate next level of care at discharge, Engage patient in therapeutic group addressing interpersonal concerns.  Short Term Goals: Engage patient in aftercare planning with referrals and resources, Increase social support, Increase ability to appropriately verbalize feelings, Increase emotional regulation, Facilitate acceptance of mental health diagnosis and concerns, Facilitate patient progression through stages of change regarding substance use diagnoses and concerns, Identify triggers associated with mental health/substance abuse issues, and Increase skills for wellness and recovery  Therapeutic Interventions: Assess for all discharge needs, 1 to 1 time with Social worker, Explore available resources and support systems, Assess for adequacy in community support network, Educate family and significant other(s) on suicide prevention, Complete Psychosocial Assessment, Interpersonal group therapy.  Evaluation of Outcomes: Adequate for Discharge   Progress in Treatment: Attending groups: No. Participating in groups: No. Taking medication as prescribed: Yes. Toleration medication: Yes. Family/Significant other contact made: No, will contact:  CSW will obtain consent to reach out to collateral.  Patient understands diagnosis: Yes. Discussing patient identified problems/goals with staff: Yes. Medical problems stabilized or resolved: Yes. Denies suicidal/homicidal ideation: No. Issues/concerns per patient self-inventory: Yes. Other: none  New problem(s) identified: No, Describe:  none  New Short Term/Long Term Goal(s): Patient to work towards elimination of symptoms of psychosis, medication management for mood stabilization; development  of comprehensive mental wellness plan.  Patient Goals: Patient states "not being treated like a clone . Marland Kitchen Start investigating this place . . . I am taking a pill I do not know. They are drilling into my leg."    Discharge Plan or Barriers:  No psychosocial barriers identified at this time, patient to return to place of residence when appropriate for discharge.    Reason for Continuation of Hospitalization: Other; describe psychosis  Estimated Length of Stay: 1-7 days    Last 3 [place] Suicide Severity Risk Score: Flowsheet Row [facility] (Current[facility]2022 [facility] from [facility]17/20[facility]  C-SSRS RISK CATEGORY No Risk Error: Question 6 not populated No Risk       Last PHQ 2/9 Scores:    09/09/2021   11:21 AM 08/02/2021   10:43 AM 05/02/2021   11:08 AM  Depression screen PHQ 2/9  Decreased Interest 0 0 0  Down, Depressed, Hopeless 0 0 0  PHQ - 2 Score 0 0 0  Altered sleeping 0 3 0  Tired, decreased energy 0 1 0  Change in appetite 0 0 0  Feeling bad or failure about yourself  0 1 0  Trouble concentrating 1 0 0  Moving slowly or fidgety/restless 0 0 0  Suicidal thoughts 2 0 0  PHQ-9 Score 3 5 0  Difficult doing work/chores Somewhat difficult  Not difficult at all    Scribe for Treatment Team: Almedia Balls 07/04/2022 3:19 PM

## 2022-07-04 NOTE — H&P (Signed)
Psychiatric Admission Assessment Adult  Patient Identification: Lucas Knapp MRN:  [id number] Date of Evaluation:  07/04/2022 Chief Complaint:  Schizophrenia (HCC) [F20.9] Principal Diagnosis: Schizophrenia (HCC) Diagnosis:  Principal Problem:   Schizophrenia (HCC)   Lucas Knapp is a 36 year old male with a past psychiatric history of paranoid schizophrenia who presented voluntarily on 7/12 to the [facility] behavioral health urgent care to "get back on my meds".  At that time he was noted to be psychotic and was recommended for inpatient placement.  The patient was transferred to the [facility] behavioral health hospital for further management.  He has now been placed under involuntary commitment.  Forced medications have been started.  Current Outpatient (Home) Medication List: Zyprexa 10 mg QHS PRN medication prior to evaluation: none  ED course: uneventful, patient was compliant and able to be verbally de-escalated when agitated Collateral Information:  Called patient's brother, with his consent, Lucas Knapp [phone]. Per his reports he talks with the patient approximately once a week. Last talked with him one week ago. His parents who live in [place] (3 hrs away), call every day and check in to encourage him to take his medicine.   Called patient's mother, Lucas Knapp, at [phone]. States that he has been "off the wall recently" . He gets SSI but works part time to make ends meet. He recently lost this job and that has made upset. He has a twin brother with schizophrenia. Asked what made him stop taking Haldol. They are unsure. No legal charges pending. No history of homicide or hurting others.  Explained plan for resumption of Haldol and forced meds.  She expressed understanding and agreement.  POA/Legal Guardian: none per chart review and per family  HPI:  Per chart review, the patient was last admitted to the [facility] behavioral health hospital in 2019.  At  that time the patient carried a diagnosis of paranoid schizophrenia.  He was started on Haldol which was titrated to a dose of 10 mg in the morning and 15 mg at night.  For a long period of time, the patient saw [place] at the [facility] on an outpatient basis.  The patient complained about his Haldol and the provider switched him to Zyprexa 10 mg nightly.  There is no specification for why the patient did not like his Haldol.  3 months later the [facility] was [facility].  They report that the patient stabilized on Abilify 10 mg.  It does not appear that he received an LAI.  On interview and assessment today on the unit, the patient exhibits severe psychosis, with grandiose delusions and disorganized speech.  The patient reports there is "a Public relations account executive who came outside and got me" and that is how he came to be at the hospital.  He reports auditory hallucinations and states something about the government giving him pills and how these subsequently went into his skin.  When asked about suicidal thoughts, the patient reports "you got to go back to the beginning" and mentions something about "sustained life".  Unable to perform psychiatric review of symptoms due to psychosis.  Past Psychiatric Hx: Patient has a previous diagnosis of paranoid schizophrenia.  Per chart review, the patient was last admitted to the [facility] behavioral health hospital in 2019.  At that time the patient carried a diagnosis of paranoid schizophrenia.  He was started on Haldol which was titrated to a dose of 10 mg in the morning  and 15 mg at night.  For a long period of time, the patient saw [facility] an [facility] basis.  The patient complained about his Haldol and the provider switched him to Zyprexa 10 mg nightly.  There is no specification for why the patient did not like his Haldol.  3 months  later the [facility].  They report that the patient stabilized on Abilify 10 mg.  It does not appear that he received an LAI.  Outpatient provider has noted the patient has failed Invega.  Substance Abuse Hx: Unable to assess given patient's psychosis  Past Medical History: Hypertension hyperlipidemia noted in the medical record.  No home medicines.  Family History: Twin brother with a history of schizophrenia   Social History: Patient lives in [place] in an apartment.  He receives SSI for mental health reasons but works part-time to make ends meet.  Parents and brother are supportive but live 3 hours away.   Total Time spent with patient: 45 minutes  Past Psychiatric History: as above  Is the patient at risk to self? Yes.    Has the patient been a risk to self in the past 6 months? Yes.    Has the patient been a risk to self within the distant past? Yes.    Is the patient a risk to others? Yes.    Has the patient been a risk to others in the past 6 months? Yes.    Has the patient been a risk to others within the distant past? Yes.     Prior Inpatient Therapy:   Prior Outpatient Therapy:    Alcohol Screening: Patient refused Alcohol Screening Tool: Yes (pt refused to answer questions.) Substance Abuse History in the last 12 months:  Yes.   Consequences of Substance Abuse: Negative Previous Psychotropic Medications: Yes  Psychological Evaluations: No  Past Medical History:  Past Medical History:  Diagnosis Date   Back pain    Bipolar 1 disorder (HCC)    Hyperlipidemia    Hypertension    pt has been prescribed HCTZ 12.5 mg daily. Pt was 140/80 on admission.    Schizophrenia (HCC)    History reviewed. No pertinent surgical history. Family History:  Family History  Problem Relation Age of Onset   Hypertension Father    Hyperlipidemia Father    Hypertension Paternal Grandmother    Hypertension Paternal Grandfather     Hypertension Brother        identical twin   Psychosis Brother        "mental issues"     Family Psychiatric  History: as above Tobacco Screening:   Social History:  Social History   Substance and Sexual Activity  Alcohol Use Yes   Comment: Pt refused to answer questions     Social History   Substance and Sexual Activity  Drug Use Yes   Comment: Pt refused to comment on drug use    Additional Social History:      Allergies:   Allergies  Allergen Reactions   Geodon [Ziprasidone Hcl] Other (See Comments)    Dry throat   Lab Results:  Results for orders placed or performed during the hospital encounter of 07/02/22 (from the past 48 hour(s))  Urinalysis, Routine w reflex microscopic Urine, Clean Catch     Status: Abnormal   Collection Time: 07/03/22 11:38 AM  Result Value Ref Range   Color, Urine STRAW (  A) YELLOW   APPearance CLEAR CLEAR   Specific Gravity, Urine 1.006 1.005 - 1.030   pH 6.0 5.0 - 8.0   Glucose, UA NEGATIVE NEGATIVE mg/dL   Hgb urine dipstick NEGATIVE NEGATIVE   Bilirubin Urine NEGATIVE NEGATIVE   Ketones, ur NEGATIVE NEGATIVE mg/dL   Protein, ur NEGATIVE NEGATIVE mg/dL   Nitrite NEGATIVE NEGATIVE   Leukocytes,Ua NEGATIVE NEGATIVE    Comment: Performed at [facility], [address], [place], [place] [address]  POCT Urine Drug Screen - (I-Screen)     Status: Abnormal   Collection Time: 07/03/22 11:38 AM  Result Value Ref Range   POC Amphetamine UR None Detected NONE DETECTED (Cut Off Level 1000 ng/mL)   POC Secobarbital (BAR) None Detected NONE DETECTED (Cut Off Level 300 ng/mL)   POC Buprenorphine (BUP) None Detected NONE DETECTED (Cut Off Level 10 ng/mL)   POC Oxazepam (BZO) Positive (A) NONE DETECTED (Cut Off Level 300 ng/mL)    Comment: was given prn ativan 7/13 at [facility]   POC Cocaine UR None Detected NONE DETECTED (Cut Off Level 300 ng/mL)   POC Methamphetamine UR None Detected NONE DETECTED (Cut Off Level 1000 ng/mL)   POC Morphine  None Detected NONE DETECTED (Cut Off Level 300 ng/mL)   POC Methadone UR None Detected NONE DETECTED (Cut Off Level 300 ng/mL)   POC Oxycodone UR None Detected NONE DETECTED (Cut Off Level 100 ng/mL)   POC Marijuana UR None Detected NONE DETECTED (Cut Off Level 50 ng/mL)  CBC with Differential/Platelet     Status: None   Collection Time: 07/03/22  2:21 PM  Result Value Ref Range   WBC 6.4 4.0 - 10.5 K/uL   RBC 5.22 4.22 - 5.81 MIL/uL   Hemoglobin 16.2 13.0 - 17.0 g/dL   HCT 60.4 54.0 - 98.1 %   MCV 88.9 80.0 - 100.0 fL   MCH 31.0 26.0 - 34.0 pg   MCHC 34.9 30.0 - 36.0 g/dL   RDW 19.1 47.8 - 29.5 %   Platelets 352 150 - 400 K/uL   nRBC 0.0 0.0 - 0.2 %   Neutrophils Relative % 30 %   Neutro Abs 1.9 1.7 - 7.7 K/uL   Lymphocytes Relative 61 %   Lymphs Abs 3.9 0.7 - 4.0 K/uL   Monocytes Relative 6 %   Monocytes Absolute 0.4 0.1 - 1.0 K/uL   Eosinophils Relative 2 %   Eosinophils Absolute 0.2 0.0 - 0.5 K/uL   Basophils Relative 1 %   Basophils Absolute 0.1 0.0 - 0.1 K/uL   Immature Granulocytes 0 %   Abs Immature Granulocytes 0.01 0.00 - 0.07 K/uL    Comment: Performed at [facility], [address], [place], [place] [address]  Comprehensive metabolic panel     Status: Abnormal   Collection Time: 07/03/22  2:21 PM  Result Value Ref Range   Sodium 141 135 - 145 mmol/L   Potassium 4.2 3.5 - 5.1 mmol/L   Chloride 107 98 - 111 mmol/L   CO2 26 22 - 32 mmol/L   Glucose, Bld 106 (H) 70 - 99 mg/dL    Comment: Glucose reference range applies only to samples taken after fasting for at least 8 hours.   BUN 11 6 - 20 mg/dL   Creatinine, Ser 8.65 0.61 - 1.24 mg/dL   Calcium 78.4 8.9 - 69.6 mg/dL   Total Protein 7.0 6.5 - 8.1 g/dL   Albumin 4.0 3.5 - 5.0 g/dL  AST 23 15 - 41 U/L   ALT 20 0 - 44 U/L   Alkaline Phosphatase 59 38 - 126 U/L   Total Bilirubin 0.4 0.3 - 1.2 mg/dL   GFR, Estimated >16 >10 mL/min    Comment: (NOTE) Calculated using the CKD-EPI Creatinine Equation  (2021)    Anion gap 8 5 - 15    Comment: Performed at [facility], [address] [place], [place] [address]  Hemoglobin A1c     Status: None   Collection Time: 07/03/22  2:21 PM  Result Value Ref Range   Hgb A1c MFr Bld 5.2 4.8 - 5.6 %    Comment: (NOTE) Pre diabetes:          5.7%-6.4%  Diabetes:              >6.4%  Glycemic control for   <7.0% adults with diabetes    Mean Plasma Glucose 102.54 mg/dL    Comment: Performed at [facility], [address], [place], [place] [address]  Magnesium     Status: None   Collection Time: 07/03/22  2:21 PM  Result Value Ref Range   Magnesium 2.1 1.7 - 2.4 mg/dL    Comment: Performed at [facility], [address] [place], [place] [address]  Ethanol     Status: None   Collection Time: 07/03/22  2:21 PM  Result Value Ref Range   Alcohol, Ethyl (B) <10 <10 mg/dL    Comment: (NOTE) Lowest detectable limit for serum alcohol is 10 mg/dL.  For medical purposes only. Performed at [facility], [address] [place], [place] [address]   Lipid panel     Status: Abnormal   Collection Time: 07/03/22  2:21 PM  Result Value Ref Range   Cholesterol 232 (H) 0 - 200 mg/dL   Triglycerides 213 (H) <150 mg/dL   HDL 38 (L) >08 mg/dL   Total CHOL/HDL Ratio 6.1 RATIO   VLDL 53 (H) 0 - 40 mg/dL   LDL Cholesterol 657 (H) 0 - 99 mg/dL    Comment:        Total Cholesterol/HDL:CHD Risk Coronary Heart Disease Risk Table                     Men   Women  1/2 Average Risk   3.4   3.3  Average Risk       5.0   4.4  2 X Average Risk   9.6   7.1  3 X Average Risk  23.4   11.0        Use the calculated Patient Ratio above and the CHD Risk Table to determine the patient's CHD Risk.        ATP III CLASSIFICATION (LDL):  <100     mg/dL   Optimal  846-962  mg/dL   Near or Above                    Optimal  130-159  mg/dL   Borderline  952-841  mg/dL   High  >324     mg/dL   Very High Performed at [facility],  [address], [place], [place] [address]   TSH     Status: None   Collection Time: 07/03/22  2:21 PM  Result Value Ref Range   TSH 1.299 0.350 - 4.500 uIU/mL    Comment: Performed by a 3rd Generation assay with a  functional sensitivity of <=0.01 uIU/mL. Performed at [facility], [address], [place], [place] [address]     Blood Alcohol level:  Lab Results  Component Value Date   ETH <10 07/03/2022   ETH <10 04/17/2022    Metabolic Disorder Labs:  Lab Results  Component Value Date   HGBA1C 5.2 07/03/2022   MPG 102.54 07/03/2022   MPG 96.8 12/22/2018   Lab Results  Component Value Date   PROLACTIN 30.0 (H) 07/29/2018   Lab Results  Component Value Date   CHOL 232 (H) 07/03/2022   TRIG 265 (H) 07/03/2022   HDL 38 (L) 07/03/2022   CHOLHDL 6.1 07/03/2022   VLDL 53 (H) 07/03/2022   LDLCALC 141 (H) 07/03/2022   LDLCALC 91 04/17/2022    Current Medications: Current Facility-Administered Medications  Medication Dose Route Frequency Provider Last Rate Last Admin   alum & mag hydroxide-simeth (MAALOX/MYLANTA) 200-200-20 MG/5ML suspension 30 mL  30 mL Oral Q4H PRN Sindy Guadeloupe, NP       benztropine (COGENTIN) tablet 1 mg  1 mg Oral BID Carlyn Reichert, MD   1 mg at 07/04/22 1636   Or   benztropine mesylate (COGENTIN) injection 1 mg  1 mg Intramuscular BID Carlyn Reichert, MD       diphenhydrAMINE (BENADRYL) capsule 50 mg  50 mg Oral Q6H PRN Carrion-Carrero, Margely, MD       Or   diphenhydrAMINE (BENADRYL) injection 50 mg  50 mg Intramuscular Q6H PRN Carrion-Carrero, Margely, MD       haloperidol (HALDOL) tablet 5 mg  5 mg Oral Q8H PRN Carrion-Carrero, Margely, MD       Or   haloperidol lactate (HALDOL) injection 5 mg  5 mg Intramuscular Q8H PRN Carrion-Carrero, Margely, MD       haloperidol (HALDOL) tablet 5 mg  5 mg Oral BID Carlyn Reichert, MD   5 mg at 07/04/22 1638   Or   haloperidol lactate (HALDOL) injection 5 mg  5 mg Intramuscular BID Carlyn Reichert, MD        LORazepam (ATIVAN) tablet 1 mg  1 mg Oral BID Carlyn Reichert, MD   1 mg at 07/04/22 1636   Or   LORazepam (ATIVAN) injection 1 mg  1 mg Intramuscular BID Carlyn Reichert, MD       LORazepam (ATIVAN) tablet 2 mg  2 mg Oral Q6H PRN Carrion-Carrero, Margely, MD       Or   LORazepam (ATIVAN) injection 2 mg  2 mg Intramuscular Q6H PRN Carrion-Carrero, Margely, MD       magnesium hydroxide (MILK OF MAGNESIA) suspension 30 mL  30 mL Oral Daily PRN Sindy Guadeloupe, NP       naproxen (NAPROSYN) tablet 250 mg  250 mg Oral Q6H PRN Carlyn Reichert, MD   250 mg at 07/04/22 1643   nicotine (NICODERM CQ - dosed in mg/24 hours) patch 21 mg  21 mg Transdermal Daily Carlyn Reichert, MD       traZODone (DESYREL) tablet 50 mg  50 mg Oral QHS PRN Sindy Guadeloupe, NP       PTA Medications: Medications Prior to Admission  Medication Sig Dispense Refill Last Dose   divalproex (DEPAKOTE) 500 MG DR tablet Take 500 mg by mouth daily.      OLANZapine (ZYPREXA) 10 MG tablet Take 1 tablet (10 mg total) by mouth in the morning and at bedtime.       Musculoskeletal: Strength & Muscle Tone: within normal limits Gait &  Station: normal Patient leans: N/A     Psychiatric Specialty Exam:  Presentation  General Appearance: Fairly Groomed (Initially seen wrapped in blankets, resting in bed comfortable)  Eye Contact:Fair (Minimal blinking. Was not shifting or darting.)  Speech: clear and coherent, somewhat pressured Speech Volume:Increased  Handedness:Right   Mood and Affect  Mood: "fine" Affect: constricted   Thought Process  Thought Processes:Irrevelant; Disorganized  Duration of Psychotic Symptoms: Greater than six months  Past Diagnosis of Schizophrenia or Psychoactive disorder: Yes  Descriptions of Associations:Loose  Orientation:Other (comment) (Patient declined to answer)  Thought Content: delusions, illogical Hallucinations:Hallucinations: Auditory; Visual Description of Auditory  Hallucinations: God speaking to him over the intercom Description of Visual Hallucinations: God's face in the clouds, especially the grocery store  Ideas of Reference: none expressed Suicidal Thoughts: unable to elicit, patient's answers were tnagential Homicidal Thoughts: as above  Sensorium  Memory:Immediate Good  Judgment:Impaired  Insight: poor  Executive Functions  Concentration:Fair  Attention Span:Fair  Recall:Good  Fund of Knowledge:Fair  Language:Good   Psychomotor Activity  Psychomotor Activity:Psychomotor Activity: Normal   Assets  Assets:Communication Skills; Desire for Improvement   Sleep  Sleep:Sleep: Fair   Physical Exam Constitutional:      Appearance: the patient is not toxic-appearing.  Pulmonary:     Effort: Pulmonary effort is normal.  Neurological:     General: No focal deficit present.     Mental Status: the patient is alert and oriented to person, place, and time.   Review of Systems  Respiratory:  Negative for shortness of breath.   Cardiovascular:  Negative for chest pain.  Gastrointestinal:  Negative for abdominal pain, constipation, diarrhea, nausea and vomiting.  Neurological:  Negative for headaches.   Blood pressure (!) 146/114, pulse 79, temperature 98.2 F (36.8 C), temperature source Oral, height 5\' 4"  (1.626 m), weight 89.4 kg, SpO2 94 %. Body mass index is 33.81 kg/m.  Treatment Plan Summary: Daily contact with patient to assess and evaluate symptoms and progress in treatment and Medication management  Physician Treatment Plan for Primary Diagnosis: Schizophrenia (HCC) Long Term Goal(s): Improvement in symptoms so as ready for discharge  Short Term Goals: Ability to verbalize feelings will improve  Physician Treatment Plan for Secondary Diagnosis: Principal Problem:   Schizophrenia (HCC)  Long Term Goal(s): Improvement in symptoms so as ready for discharge  Short Term Goals: Ability to maintain clinical  measurements within normal limits will improve   ASSESSMENT:  Diagnoses / Active Problems: Paranoid Schizophrenia  (UDS negative for illegal drugs on admission)   PLAN: Safety and Monitoring:  -- Involuntary admission to inpatient psychiatric unit for safety, stabilization and treatment  -- Daily contact with patient to assess and evaluate symptoms and progress in treatment  -- Patient's case to be discussed in multi-disciplinary team meeting  -- Observation Level : q15 minute checks  -- Vital signs:  q12 hours  -- Precautions: suicide, elopement, and assault  2. Psychiatric Diagnoses and Treatment:   --*Haldol 5 mg BID with Cogentin 1 mg with Ativan 1 mg PO/IM*   --*This is a forced medication and cannot be refused*  --PRN Haldol 5 mg with Benadryl 50 mg with Ativan 2 mg q6hrs PO/IM  --Consider possible switch to Abilify once patient improves given his preference for this medication  -- Metabolic profile and EKG monitoring obtained while on an atypical antipsychotic (BMI: 34 Lipid Panel: LDL 141 (nonfasting) HbgA1c: 5.2 QTc: pending)   -- Encouraged patient to participate in unit milieu and in scheduled group  therapies   --No roommate x5 days  -- Short Term Goals: Ability to verbalize feelings will improve  -- Long Term Goals: Improvement in symptoms so as ready for discharge    3. Medical Issues Being Addressed:   Tobacco Use Disorder  -- Nicotine patch 21mg /24 hours ordered  -- Smoking cessation encouraged  No need for CIWA or COWS, presently but will CTM  4. Discharge Planning:   -- Social work and case management to assist with discharge planning and identification of hospital follow-up needs prior to discharge  -- Estimated LOS:  7-10 days  -- Discharge Concerns: Need to establish a safety plan; Medication compliance and effectiveness  -- Discharge Goals: Return home with outpatient referrals for mental health follow-up including medication  management/psychotherapy   I certify that inpatient services furnished can reasonably be expected to improve the patient's condition.    06-09-1986, MD 7/14/20235:46 PM

## 2022-07-04 NOTE — Progress Notes (Signed)
[facility] LCSW Progress Note   07/04/2022    4:32 PM  Lucas Knapp   [id number]   Type of Contact and Topic:    CSW attempted to complete PSA with patient. However, patient was unable/unwilling to participate due to disorganized thought/behaviors. Patient is not a good  historian at this time. Unable to engage in effective communication.  CSW to make additional attempts to complete PSA at a later time.    Signed:  Corky Crafts, MSW, LCSWA, LCAS 07/04/2022 4:32 PM

## 2022-07-04 NOTE — Progress Notes (Signed)
   07/04/22 1945  Psych Admission Type (Psych Patients Only)  Admission Status Involuntary  Psychosocial Assessment  Patient Complaints Anxiety;Isolation  Eye Contact Fair  Facial Expression Anxious  Affect Preoccupied  Speech Slow  Interaction Cautious;Guarded;Defensive  Motor Activity Slow  Appearance/Hygiene Unremarkable  Behavior Characteristics Resistant to care  Mood Labile  Aggressive Behavior  Effect No apparent injury  Thought Process  Coherency Tangential;Disorganized  Content Blaming others  Delusions Paranoid  Perception WDL  Hallucination None reported or observed  Judgment Impaired  Confusion None  Danger to Self  Current suicidal ideation? Denies  Danger to Others  Danger to Others None reported or observed

## 2022-07-04 NOTE — [facility] Group Notes (Signed)
Wrap up group   Pt attended and contributed to group. 

## 2022-07-04 NOTE — B[facility] Suicide Risk Assessment (Signed)
[facility] Admission Suicide Risk Assessment   Nursing information obtained from:  Patient Demographic factors:  Male, Low socioeconomic status, Living alone Current Mental Status:  NA Loss Factors:  NA Historical Factors:  Impulsivity Risk Reduction Factors:  Positive therapeutic relationship, Employed  Total Time spent with patient: 45 minutes Principal Problem: Schizophrenia (HCC) Diagnosis:  Principal Problem:   Schizophrenia (HCC)  Subjective Data:  Lucas Knapp is a 36 year old male with a past psychiatric history of paranoid schizophrenia who presented voluntarily on 7/12 to the [facility] behavioral health urgent care to "get back on my meds".  At that time he was noted to be psychotic and was recommended for inpatient placement.  The patient was transferred to the [facility] behavioral health hospital for further management.  He has now been placed under involuntary commitment.  Forced medications have been started.   Current Outpatient (Home) Medication List: Zyprexa 10 mg QHS PRN medication prior to evaluation: none   ED course: uneventful, patient was compliant and able to be verbally de-escalated when agitated Collateral Information:  Called patient's brother, with his consent, Durell Lofaso [phone]. Per his reports he talks with the patient approximately once a week. Last talked with him one week ago. His parents who live in [place] (3 hrs away), call every day and check in to encourage him to take his medicine.    Called patient's mother, Theordore Cisnero, at [phone]. States that he has been "off the wall recently" . He gets SSI but works part time to make ends meet. He recently lost this job and that has made upset. He has a twin brother with schizophrenia. Asked what made him stop taking Haldol. They are unsure. No legal charges pending. No history of homicide or hurting others.  Explained plan for resumption of Haldol and forced meds.  She expressed  understanding and agreement.   POA/Legal Guardian: none per chart review and per family   HPI:  Per chart review, the patient was last admitted to the [facility] behavioral health hospital in 2019.  At that time the patient carried a diagnosis of paranoid schizophrenia.  He was started on Haldol which was titrated to a dose of 10 mg in the morning and 15 mg at night.  For a long period of time, the patient saw [name] at the [facility] on an outpatient basis.  The patient complained about his Haldol and the provider switched him to Zyprexa 10 mg nightly.  There is no specification for why the patient did not like his Haldol.  3 months later the [facility].  They report that the patient stabilized on Abilify 10 mg.  It does not appear that he received an LAI.   On interview and assessment today on the unit, the patient exhibits severe psychosis, with grandiose delusions and disorganized speech.  The patient reports there is "a Public relations account executive who came outside and got me" and that is how he came to be at the hospital.  He reports auditory hallucinations and states something about the government giving him pills and how these subsequently went into his skin.  When asked about suicidal thoughts, the patient reports "you got to go back to the beginning" and mentions something about "sustained life".   Unable to perform psychiatric review of symptoms due to psychosis.   Past Psychiatric Hx: Patient has a previous diagnosis of paranoid schizophrenia.  Per chart review, the patient was last admitted  to the [facility] behavioral health hospital in 2019.  At that time the patient carried a diagnosis of paranoid schizophrenia.  He was started on Haldol which was titrated to a dose of 10 mg in the morning and 15 mg at night.  For a long period of time, the patient saw [name] at the [facility]  on an outpatient basis.  The patient complained about his Haldol and the provider switched him to Zyprexa 10 mg nightly.  There is no specification for why the patient did not like his Haldol.  3 months later the [facility].  They report that the patient stabilized on Abilify 10 mg.  It does not appear that he received an LAI.  Outpatient provider has noted the patient has failed Invega.   Substance Abuse Hx: Unable to assess given patient's psychosis   Past Medical History: Hypertension hyperlipidemia noted in the medical record.  No home medicines.   Family History: Twin brother with a history of schizophrenia     Social History: Patient lives in [place] in an apartment.  He receives SSI for mental health reasons but works part-time to make ends meet.  Parents and brother are supportive but live 3 hours away.  Continued Clinical Symptoms:    The "Alcohol Use Disorders Identification Test", Guidelines for Use in Primary Care, Second Edition.  World Science writer [facility]). Score between 0-7:  no or low risk or alcohol related problems. Score between 8-15:  moderate risk of alcohol related problems. Score between 16-19:  high risk of alcohol related problems. Score 20 or above:  warrants further diagnostic evaluation for alcohol dependence and treatment.   CLINICAL FACTORS:   Schizophrenia:   Paranoid or undifferentiated type   Musculoskeletal: Strength & Muscle Tone: within normal limits Gait & Station: normal Patient leans: N/A         Psychiatric Specialty Exam:   Presentation  General Appearance: Fairly Groomed (Initially seen wrapped in blankets, resting in bed comfortable)   Eye Contact:Fair (Minimal blinking. Was not shifting or darting.)   Speech: clear and coherent, somewhat pressured Speech Volume:Increased   Handedness:Right     Mood and Affect  Mood: "fine" Affect: constricted     Thought  Process  Thought Processes:Irrevelant; Disorganized   Duration of Psychotic Symptoms: Greater than six months   Past Diagnosis of Schizophrenia or Psychoactive disorder: Yes   Descriptions of Associations:Loose   Orientation:Other (comment) (Patient declined to answer)   Thought Content: delusions, illogical Hallucinations:Hallucinations: Auditory; Visual Description of Auditory Hallucinations: God speaking to him over the intercom Description of Visual Hallucinations: God's face in the clouds, especially the grocery store   Ideas of Reference: none expressed Suicidal Thoughts: unable to elicit, patient's answers were tnagential Homicidal Thoughts: as above   Sensorium  Memory:Immediate Good   Judgment:Impaired   Insight: poor   Executive Functions  Concentration:Fair   Attention Span:Fair   Recall:Good   Fund of Knowledge:Fair   Language:Good     Psychomotor Activity  Psychomotor Activity:Psychomotor Activity: Normal     Assets  Assets:Communication Skills; Desire for Improvement     Sleep  Sleep:Sleep: Fair  Physical Exam Constitutional:      Appearance: the patient is not toxic-appearing.  Pulmonary:     Effort: Pulmonary effort is normal.  Neurological:     General: No focal deficit present.     Mental Status: the patient is alert and oriented to person, place,  and time.   Review of Systems  Respiratory:  Negative for shortness of breath.   Cardiovascular:  Negative for chest pain.  Gastrointestinal:  Negative for abdominal pain, constipation, diarrhea, nausea and vomiting.  Neurological:  Negative for headaches.   Blood pressure (!) 146/114, pulse 79, temperature 98.2 F (36.8 C), temperature source Oral, height 5\' 4"  (1.626 m), weight 89.4 kg, SpO2 94 %. Body mass index is 33.81 kg/m.   COGNITIVE FEATURES THAT CONTRIBUTE TO RISK:  Loss of executive function    SUICIDE RISK:  Mild: Patient with no identifiable suicidal ideation or history  of previous attempts.  Presents with loss of executive function 2/2 schizophrenia psychosis.  Patient has some social support.  PLAN OF CARE:  PLAN: Safety and Monitoring:             -- Involuntary admission to inpatient psychiatric unit for safety, stabilization and treatment             -- Daily contact with patient to assess and evaluate symptoms and progress in treatment             -- Patient's case to be discussed in multi-disciplinary team meeting             -- Observation Level : q15 minute checks             -- Vital signs:  q12 hours             -- Precautions: suicide, elopement, and assault   2. Psychiatric Diagnoses and Treatment:              --*Haldol 5 mg BID with Cogentin 1 mg with Ativan 1 mg PO/IM*                         --*This is a forced medication and cannot be refused*             --PRN Haldol 5 mg with Benadryl 50 mg with Ativan 2 mg q6hrs PO/IM  --Consider possible switch to Abilify once patient improves given his preference for this medication             -- Metabolic profile and EKG monitoring obtained while on an atypical antipsychotic (BMI: 34 Lipid Panel: LDL 141 (nonfasting) HbgA1c: 5.2 QTc: pending)              -- Encouraged patient to participate in unit milieu and in scheduled group therapies              --No roommate x5 days             -- Short Term Goals: Ability to verbalize feelings will improve             -- Long Term Goals: Improvement in symptoms so as ready for discharge                3. Medical Issues Being Addressed:              Tobacco Use Disorder             -- Nicotine patch 21mg /24 hours ordered             -- Smoking cessation encouraged             No need for CIWA or COWS, presently but will CTM   4. Discharge Planning:              --  Social work and case management to assist with discharge planning and identification of hospital follow-up needs prior to discharge             -- Estimated LOS:  7-10 days             --  Discharge Concerns: Need to establish a safety plan; Medication compliance and effectiveness             -- Discharge Goals: Return home with outpatient referrals for mental health follow-up including medication management/psychotherapy  I certify that inpatient services furnished can reasonably be expected to improve the patient's condition.   Carlyn Reichert, MD 07/04/2022, 5:48 PM

## 2022-07-05 ENCOUNTER — Encounter (HOSPITAL_COMMUNITY): Payer: Self-pay | Admitting: Psychiatry

## 2022-07-05 DIAGNOSIS — F200 Paranoid schizophrenia: Secondary | ICD-10-CM | POA: Diagnosis not present

## 2022-07-05 MED ORDER — AMLODIPINE BESYLATE 5 MG PO TABS
5.0000 mg | ORAL_TABLET | Freq: Every day | ORAL | Status: DC
  Administered 2022-07-05 – 2022-07-10 (×6): 5 mg via ORAL
  Filled 2022-07-05 (×8): qty 1

## 2022-07-05 NOTE — [facility] Suicide Risk Assessment (Signed)
[facility] INPATIENT:  Family/Significant Other Suicide Prevention Education  Suicide Prevention Education:  Patient Refusal for Family/Significant Other Suicide Prevention Education: The patient Lucas Knapp has refused to provide written consent for family/significant other to be provided Family/Significant Other Suicide Prevention Education during admission and/or prior to discharge.  Physician notified.  CSW completed SPE with patient. Discussed potential triggers leading to suicidal ideation in addition to coping skills one might use in order to delay and distract self from self harming behaviors. CSW encouraged patient to utilize emergency services if they felt unable to maintain their safety. SPE flyer provided to patient at this time.   Corky Crafts 07/05/2022, 2:11 PM

## 2022-07-05 NOTE — Progress Notes (Signed)
Pt is A&OX4, calm, flat, isolative, minimal, reluctantly cooperative, denies suicidal ideations, denies homicidal ideations, denies auditory hallucinations and denies visual hallucinations. Pt verbally agrees to approach staff if these become apparent and before harming self or others. Pt denies experiencing nightmares. Mood and affect are congruent. Pt appetite is ok. No complaints of anxiety, distress, pain and/or discomfort at this time. Pt's memory appears to be grossly intact, and Pt hasn't displayed any injurious behaviors. Pt is medication compliant. There's no evidence of suicidal intent. Psychomotor activity was WNL. No s/s of Parkinson, Dystonia, Akathisia and/or Tardive Dyskinesia noted.  Pt remains on unit restriction.

## 2022-07-05 NOTE — Progress Notes (Signed)
[facility] MD Progress Note  07/05/2022 12:50 PM Lucas Knapp  MRN:  [id number] Principal Problem: Schizophrenia (HCC) Diagnosis: Principal Problem:   Schizophrenia (HCC)   Reason for Admission: Lucas Knapp is a 36 y.o. male with history of paranoid schizophrenia who presented to [facility] for psychotic symptom relapse secondary to medication noncompliance.  Patient has since been IVC'd due to refusal to initiate psychotropic medications.  Subjective:  Seen and assessed at bedside.  Resting comfortably in bed.  Reports mood is "good".  Reports having perseverative thoughts regarding slavery that persisted through the night as well as in his dreams per his report.  He also states that he is having difficulty differentiating his dreams from reality at times but does endorse that he is able to recognize that he is not having his "normal thoughts".  Patient reports keeping to himself for this reason as well as he feels that interacting with other people agitate him.  Patient reports haloperidol was giving him a small rash on his right forearm.  Patient also reports that he does not like Haldol because it gives him "bad thoughts".  Denies SI/HI/AVH today.  Denies any problems with eating or sleeping.  Discussed medication adjustments and that we would discuss changing either tomorrow or Monday based on how he was doing for which patient was amenable to this.  Discussed possibly doing LAI for which patient is hesitant about given that he had previously tried 3 different LAIs.  Patient is willing to discuss further his medications he will be taking as an outpatient.  Patient is also requesting a letter that he will be able to send to bone company regarding car payments as he has not been able to work during this hospitalization.  Objective:  Chart Review Past 24 hours of patient's chart was reviewed.  Patient is taking haloperidol and Ativan orally. Per RN notes, no documented behavioral issues and is  attending group. Was noted to have elevated BP 147/111 this AM. Patient slept, 7 hours  Total Time spent with patient: 30 minutes  Past Medical History:  Past Medical History:  Diagnosis Date   Back pain    Bipolar 1 disorder (HCC)    Hyperlipidemia    Hypertension    pt has been prescribed HCTZ 12.5 mg daily. Pt was 140/80 on admission.    Schizophrenia (HCC)     History reviewed. No pertinent surgical history. Family History:  Family History  Problem Relation Age of Onset   Hypertension Father    Hyperlipidemia Father    Hypertension Paternal Grandmother    Hypertension Paternal Grandfather    Hypertension Brother        identical twin   Psychosis Brother        "mental issues"      Social History:  Social History   Substance and Sexual Activity  Alcohol Use Yes   Comment: Pt refused to answer questions     Social History   Substance and Sexual Activity  Drug Use Yes   Comment: Pt refused to comment on drug use    Social History   Socioeconomic History   Marital status: Single    Spouse name: Not on file   Number of children: Not on file   Years of education: Not on file   Highest education level: Not on file  Occupational History   Not on file  Tobacco Use   Smoking status: Every Day    Types: Cigarettes   Smokeless tobacco: Never   Tobacco  comments:    10-15  Vaping Use   Vaping Use: Not on file  Substance and Sexual Activity   Alcohol use: Yes    Comment: Pt refused to answer questions   Drug use: Yes    Comment: Pt refused to comment on drug use   Sexual activity: Not Currently  Other Topics Concern   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    Single   No children   Completed college (bachelors in Statistician)   Enjoys swimming, playing pool, walking   Grew up [place].  Has identical twin and 2 other brothers in [place]       06/2022: Homeless      07/03/22: Pt says he lives alone and works but doesn't specify his job. Pt denies social support. Has psychiatric services with Gretchen Short, NP.   Social Determinants of Health   Financial Resource Strain: Not on file  Food Insecurity: Not on file  Transportation Needs: Not on file  Physical Activity: Not on file  Stress: Not on file  Social Connections: Not on file   Additional Social History:                         Current Medications: Current Facility-Administered Medications  Medication Dose Route Frequency Provider Last Rate Last Admin   alum & mag hydroxide-simeth (MAALOX/MYLANTA) 200-200-20 MG/5ML suspension 30 mL  30 mL Oral Q4H PRN Sindy Guadeloupe, NP       amLODipine (NORVASC) tablet 5 mg  5 mg Oral Daily Park Pope, MD   5 mg at 07/05/22 0916   benztropine (COGENTIN) tablet 1 mg  1 mg Oral BID Carlyn Reichert, MD   1 mg at 07/05/22 2353   Or   benztropine mesylate (COGENTIN) injection 1 mg  1 mg Intramuscular BID Carlyn Reichert, MD       diphenhydrAMINE (BENADRYL) capsule 50 mg  50 mg Oral Q6H PRN Carrion-Carrero, Margely, MD       Or   diphenhydrAMINE (BENADRYL) injection 50 mg  50 mg Intramuscular Q6H PRN Carrion-Carrero, Margely, MD       haloperidol (HALDOL) tablet 5 mg  5 mg Oral Q8H PRN Carrion-Carrero, Margely, MD       Or   haloperidol lactate (HALDOL) injection 5 mg  5 mg Intramuscular Q8H PRN Carrion-Carrero, Margely, MD       haloperidol (HALDOL) tablet 5 mg  5 mg Oral BID Carlyn Reichert, MD   5 mg at 07/05/22 6144   Or   haloperidol lactate (HALDOL) injection 5 mg  5 mg Intramuscular BID Carlyn Reichert, MD       LORazepam (ATIVAN) tablet 1 mg  1 mg Oral BID Carlyn Reichert, MD   1 mg at 07/05/22 3154   Or   LORazepam (ATIVAN) injection 1 mg  1 mg Intramuscular BID Carlyn Reichert, MD       LORazepam (ATIVAN) tablet 2 mg  2 mg Oral Q6H PRN Carrion-Carrero, Margely, MD       Or   LORazepam (ATIVAN) injection 2 mg  2 mg  Intramuscular Q6H PRN Carrion-Carrero, Margely, MD       magnesium hydroxide (MILK OF MAGNESIA) suspension 30 mL  30 mL Oral Daily PRN Sindy Guadeloupe, NP       naproxen (NAPROSYN) tablet 250 mg  250 mg  Oral Q6H PRN Carlyn Reichert, MD   250 mg at 07/04/22 1643   nicotine (NICODERM CQ - dosed in mg/24 hours) patch 21 mg  21 mg Transdermal Daily Carlyn Reichert, MD       traZODone (DESYREL) tablet 50 mg  50 mg Oral QHS PRN Sindy Guadeloupe, NP        Lab Results:  No results found for this or any previous visit (from the past 24 hour(s)).  Blood Alcohol level:  Lab Results  Component Value Date   ETH <10 07/03/2022   ETH <10 04/17/2022    Metabolic Disorder Labs: Lab Results  Component Value Date   HGBA1C 5.2 07/03/2022   MPG 102.54 07/03/2022   MPG 96.8 12/22/2018   Lab Results  Component Value Date   PROLACTIN 30.0 (H) 07/29/2018   Lab Results  Component Value Date   CHOL 232 (H) 07/03/2022   TRIG 265 (H) 07/03/2022   HDL 38 (L) 07/03/2022   CHOLHDL 6.1 07/03/2022   VLDL 53 (H) 07/03/2022   LDLCALC 141 (H) 07/03/2022   LDLCALC 91 04/17/2022    Physical Findings: AIMS: n/a (will test tomorrow)  Musculoskeletal: Strength & Muscle Tone: within normal limits Gait & Station: normal  Psychiatric Specialty Exam:  Presentation  Knapp Appearance: Fairly Groomed   Eye Contact:Fair   Speech:Clear and Coherent; Normal Rate   Speech Volume:Normal   Handedness:Right    Mood and Affect  Mood:Euthymic   Affect:Flat    Thought Process  Thought Processes:Disorganized   Descriptions of Associations:Loose   Orientation:Full (Time, Place and Person)   Thought Content:Perseveration; Rumination; Scattered   History of Schizophrenia/Schizoaffective disorder:Yes   Duration of Psychotic Symptoms:Greater than six months   Hallucinations:Hallucinations: None  Ideas of Reference:None   Suicidal Thoughts:Suicidal Thoughts: No  Homicidal  Thoughts:Homicidal Thoughts: No   Sensorium  Memory:Immediate Good; Recent Good; Remote Good   Judgment:Fair   Insight:Fair    Executive Functions  Concentration:Fair   Attention Span:Fair   Recall:Good   Fund of Knowledge:Fair   Language:Good    Psychomotor Activity  Psychomotor Activity:Psychomotor Activity: Normal  Assets  Assets:Communication Skills; Desire for Improvement    Sleep  Sleep:Sleep: Good Number of Hours of Sleep: 7   Physical Exam: Review of Systems  Respiratory:  Negative for shortness of breath.   Cardiovascular:  Negative for chest pain.  Gastrointestinal:  Negative for abdominal pain, constipation, diarrhea, heartburn, nausea and vomiting.  Neurological:  Negative for headaches.   Blood pressure (!) 147/111, pulse 80, temperature 98.1 F (36.7 C), temperature source Oral, height 5\' 4"  (1.626 m), weight 89.4 kg, SpO2 96 %. Body mass index is 33.81 kg/m.   ASSESSMENT AND PLAN Lucas Knapp is a 36 y.o. male with history of paranoid schizophrenia who presented to [facility] for psychotic symptom relapse secondary to medication noncompliance.  Patient has since been IVC'd due to refusal to initiate psychotropic medications. This is hospitalization day 2.  PLAN Safety and Monitoring: Involuntary admission to inpatient psychiatric unit for safety, stabilization and treatment Daily contact with patient to assess and evaluate symptoms and progress in treatment Patient's case to be discussed in multi-disciplinary team meeting Observation Level : q15 minute checks Vital signs: q12 hours Precautions: suicide, elopement, and assault   Psychiatric Problems Paranoid Schizophrenia  -Forced medication protocol -Continue Haldol 5 mg PO/IM BID for psychosis -Continue Cogentin 1 mg PO/IM twice daily for EPS prophylaxis -Continue Ativan 1 mg PO/IM for psychosis -We will consider transitioning to either Abilify or Zyprexa  tomorrow if patient  continues to attribute reported side effects with Haldol -ECG pending -No roommate order  Medical Problems Tobacco Use Disorder -NRT with patch  PRNs Tylenol 650 mg for mild pain Maalox/Mylanta 30 mL for indigestion Hydroxyzine 25 mg tid for anxiety Milk of Magnesia 30 mL for constipation Trazodone 50 mg for sleep   4. Discharge Planning: Social work and case management to assist with discharge planning and identification of hospital follow-up needs prior to discharge Estimated LOS: 7-10 days Discharge Concerns: Need to establish a safety plan; Medication compliance and effectiveness Discharge Goals: Return home with outpatient referrals for mental health follow-up including medication management/psychotherapy     Park Pope, MD 07/05/2022, 12:50 PM

## 2022-07-05 NOTE — Group Note (Signed)
LCSW Group Therapy Note   07/05/2022 4:31 PM  Type of Therapy and Topic:  Group Therapy:   Emotions and Triggers    Participation Level:  Did Not Attend  Description of Group: Participants were asked to participate in an assignment that involved exploring more about oneself. Patients were asked to identify things that typically triggered an emotional response and think about the physical symptoms they experience when feeling this way. Pt's were encouraged to identify the thoughts that they have when feeling this way and discuss ways to cope with it.  The focus was on the emotion of "anger" during this group.  Patients found they could relate to each other's experiences.  Therapeutic Goals:   Patient will state the definition of an emotion and identify two pleasant and two unpleasant emotions they have experienced. Patient will describe the relationship between thoughts, emotions and triggers.  Patient will state the definition of a trigger and identify three triggers experienced recently   Summary of Patient Progress:   The patient was invited to group, did not attend.     Therapeutic Modalities: Cognitive Behavioral Therapy Motivational Interviewing

## 2022-07-05 NOTE — Progress Notes (Signed)
   07/05/22 0615  Sleep  Number of Hours 7

## 2022-07-05 NOTE — [facility] Group Notes (Signed)
Patient did not attend goals group. He was given his self inventory sheet in his room.

## 2022-07-05 NOTE — Progress Notes (Signed)
Pt has bruise and erythema present on left wrist. Writer inquired and Pt stated it was from his watch. Writer requested Pt loosen his watch on his right wrist to prevent reoccurrence of bruise. Writer asked Pt several times to remove or loosen the watch.

## 2022-07-05 NOTE — Progress Notes (Signed)
Pt stated he was taken off Haldol by prior provider, pt stated he is willing to take Seroquel , pt stated the Haldol increased his racing thoughts. Pt has HTN and needs to be on Bp medication
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
## 2022-07-06 DIAGNOSIS — F200 Paranoid schizophrenia: Secondary | ICD-10-CM

## 2022-07-06 NOTE — Progress Notes (Addendum)
[facility] MD Progress Note  07/06/2022 10:55 AM Lucas Knapp  MRN:  [id number] Principal Problem: Schizophrenia (HCC) Diagnosis: Principal Problem:   Schizophrenia (HCC)   Reason for Admission: Lucas Knapp is a 36 y.o. male with history of paranoid schizophrenia who presented to [facility] for psychotic symptom relapse secondary to medication noncompliance.  Patient has since been IVC'd due to refusal to initiate psychotropic medications.  Subjective:  Seen and assessed at bedside.  Resting comfortably in bed.  Reports mood is "good".  During initial part of assessment, with patient was appropriate and logical regarding responses to questions; however, patient became more tangential with long ramblings regarding God and how doctors were trying to "play god". He also reported desire to become a "lab rat" for the pharmaceutical companies that made antipsychotics.  Discussed medication management and patient verbalized understanding that the haloperidol was helping him rather than harming him but continued to perseverate on the fact that he has a small rash on his right forearm although he does report that it has since improved.  Patient was amenable to transitioning to Prolixin/fluphenazine or Zyprexa.  Discussed we would continue him at this time with haloperidol as it appears to be helping him within this acute phase of psychosis.  Patient verbalized understanding.  We discussed LAI and patient reports that he is not interested in it at this time because he feels that he is not able to "controlled medication and how long it is in me".  Patient requesting a letter regarding his his hospitalization.  He reports that he since he was able to work he cannot pay his bills.  Discussed that we would give him a letter at discharge with this information but will defer to social worker regarding this.  Objective:  Chart Review Past 24 hours of patient's chart was reviewed.  Patient is taking haloperidol and  Ativan orally. Per RN notes, no documented behavioral issues and is attending group. Was noted to have elevated BP 130/97 this AM. Patient slept, 7 hours  Total Time spent with patient: 30 minutes  Past Medical History:  Past Medical History:  Diagnosis Date   Back pain    Bipolar 1 disorder (HCC)    Hyperlipidemia    Hypertension    pt has been prescribed HCTZ 12.5 mg daily. Pt was 140/80 on admission.    Schizophrenia (HCC)     History reviewed. No pertinent surgical history. Family History:  Family History  Problem Relation Age of Onset   Hypertension Father    Hyperlipidemia Father    Hypertension Paternal Grandmother    Hypertension Paternal Grandfather    Hypertension Brother        identical twin   Psychosis Brother        "mental issues"      Social History:  Social History   Substance and Sexual Activity  Alcohol Use Yes   Alcohol/week: 4.0 standard drinks of alcohol   Types: 4 Cans of beer per week   Comment: occasional alcohol use roughly 2x weekly, 1-2 drinks per event     Social History   Substance and Sexual Activity  Drug Use Yes   Types: Marijuana   Comment: Pt refused to comment on drug use    Social History   Socioeconomic History   Marital status: Single    Spouse name: Not on file   Number of children: Not on file   Years of education: Not on file   Highest education level: Not on file  Occupational History   Not on file  Tobacco Use   Smoking status: Every Day    Types: Cigarettes   Smokeless tobacco: Never   Tobacco comments:    10-15  Vaping Use   Vaping Use: Not on file  Substance and Sexual Activity   Alcohol use: Yes    Alcohol/week: 4.0 standard drinks of alcohol    Types: 4 Cans of beer per week    Comment: occasional alcohol use roughly 2x weekly, 1-2 drinks per event   Drug use: Yes    Types: Marijuana    Comment: Pt refused to comment on drug use   Sexual activity: Not Currently  Other Topics Concern   Not on  file  Social History Narrative   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 Also works for the [employer] one day a week.    Single   No children   Completed college (bachelors in Pharmacist, community)   Enjoys swimming, playing pool, walking   Grew up [place].  Has identical twin and 2 other brothers in [place]      06/2022: Homeless      07/03/22: Pt says he lives alone and works but doesn't specify his job. Pt denies social support. Has psychiatric services with Burt Ek, NP.   Social Determinants of Health   Financial Resource Strain: Not on file  Food Insecurity: Not on file  Transportation Needs: Not on file  Physical Activity: Not on file  Stress: Not on file  Social Connections: Not on file   Additional Social History:                         Current Medications: Current Facility-Administered Medications  Medication Dose Route Frequency Provider Last Rate Last Admin   alum & mag hydroxide-simeth (MAALOX/MYLANTA) 200-200-20 MG/5ML suspension 30 mL  30 mL Oral Q4H PRN Evette Georges, NP       amLODipine (NORVASC) tablet 5 mg  5 mg Oral Daily France Ravens, MD   5 mg at 07/06/22 [id number]   benztropine (COGENTIN) tablet 1 mg  1 mg Oral BID Corky Sox, MD   1 mg at 07/06/22 [id number]   Or   benztropine mesylate (COGENTIN) injection 1 mg  1 mg Intramuscular BID Corky Sox, MD       diphenhydrAMINE (BENADRYL) capsule 50 mg  50 mg Oral Q6H PRN Carrion-Carrero, Margely, MD       Or   diphenhydrAMINE (BENADRYL) injection 50 mg  50 mg Intramuscular Q6H PRN Carrion-Carrero, Margely, MD       haloperidol (HALDOL) tablet 5 mg  5 mg Oral Q8H PRN Carrion-Carrero, Margely, MD       Or   haloperidol lactate (HALDOL) injection 5 mg  5 mg Intramuscular Q8H PRN Carrion-Carrero, Margely, MD       haloperidol (HALDOL) tablet 5 mg  5 mg Oral BID Corky Sox, MD   5 mg at 07/06/22 [id number]   Or   haloperidol lactate (HALDOL) injection 5 mg  5 mg Intramuscular BID  Corky Sox, MD       LORazepam (ATIVAN) tablet 1 mg  1 mg Oral BID Corky Sox, MD   1 mg at 07/06/22 0825   Or   LORazepam (ATIVAN) injection 1 mg  1 mg Intramuscular BID Corky Sox, MD       LORazepam (ATIVAN) tablet 2 mg  2 mg Oral Q6H PRN Christene Slates, MD  Or   LORazepam (ATIVAN) injection 2 mg  2 mg Intramuscular Q6H PRN Carrion-Carrero, Margely, MD       magnesium hydroxide (MILK OF MAGNESIA) suspension 30 mL  30 mL Oral Daily PRN Evette Georges, NP       naproxen (NAPROSYN) tablet 250 mg  250 mg Oral Q6H PRN Corky Sox, MD   250 mg at 07/04/22 1643   nicotine (NICODERM CQ - dosed in mg/24 hours) patch 21 mg  21 mg Transdermal Daily Corky Sox, MD       traZODone (DESYREL) tablet 50 mg  50 mg Oral QHS PRN Evette Georges, NP        Lab Results:  No results found for this or any previous visit (from the past 24 hour(s)).  Blood Alcohol level:  Lab Results  Component Value Date   ETH <10 07/03/2022   ETH <10 [id number]    Metabolic Disorder Labs: Lab Results  Component Value Date   HGBA1C 5.2 07/03/2022   MPG 102.54 07/03/2022   MPG 96.8 12/22/2018   Lab Results  Component Value Date   PROLACTIN 30.0 (H) 07/29/2018   Lab Results  Component Value Date   CHOL 232 (H) 07/03/2022   TRIG 265 (H) 07/03/2022   HDL 38 (L) 07/03/2022   CHOLHDL 6.1 07/03/2022   VLDL 53 (H) 07/03/2022   LDLCALC 141 (H) 07/03/2022   LDLCALC 91 04/17/2022    Physical Findings: AIMS: n/a (will test tomorrow)  Musculoskeletal: Strength & Muscle Tone: within normal limits Gait & Station: normal  Psychiatric Specialty Exam:  Presentation  General Appearance: Casual; Appropriate for Environment   Eye Contact:Fair   Speech:Clear and Coherent; Normal Rate   Speech Volume:Normal   Handedness:Right    Mood and Affect  Mood:Euthymic   Affect:Flat    Thought Process  Thought Processes:Disorganized   Descriptions of  Associations:Tangential   Orientation:Full (Time, Place and Person)   Thought Content:Perseveration; Rumination; Scattered; Tangential   History of Schizophrenia/Schizoaffective disorder:Yes   Duration of Psychotic Symptoms:Greater than six months   Hallucinations:Hallucinations: None  Ideas of Reference:None   Suicidal Thoughts:Suicidal Thoughts: No  Homicidal Thoughts:Homicidal Thoughts: No   Sensorium  Memory:Immediate Good; Recent Good; Remote Good   Judgment:Fair   Insight:Fair    Executive Functions  Concentration:Fair   Attention Span:Fair   [place]   Language:Good    Psychomotor Activity  Psychomotor Activity:Psychomotor Activity: Normal  Assets  Assets:Communication Skills; Desire for Improvement    Sleep  Sleep:Sleep: Good Number of Hours of Sleep: 7   Physical Exam: Review of Systems  Respiratory:  Negative for shortness of breath.   Cardiovascular:  Negative for chest pain.  Gastrointestinal:  Negative for abdominal pain, constipation, diarrhea, heartburn, nausea and vomiting.  Neurological:  Negative for headaches.   Blood pressure (!) 130/97, pulse 99, temperature 99 F (37.2 C), temperature source Oral, height 5\' 4"  (1.626 m), weight 89.4 kg, SpO2 97 %. Body mass index is 33.81 kg/m.   ASSESSMENT AND PLAN Lucas Knapp is a 36 y.o. male with history of paranoid schizophrenia who presented to [facility] for psychotic symptom relapse secondary to medication noncompliance.  Patient has since been IVC'd due to refusal to initiate psychotropic medications. This is hospitalization day 3.  PLAN Safety and Monitoring: Involuntary admission to inpatient psychiatric unit for safety, stabilization and treatment Daily contact with patient to assess and evaluate symptoms and progress in treatment Patient's case to be discussed in multi-disciplinary team meeting Observation Level :  q15 minute checks Vital  signs: q12 hours Precautions: suicide, elopement, and assault   Psychiatric Problems Paranoid Schizophrenia  -Forced medication protocol -Continue Haldol 5 mg PO/IM BID for psychosis -Continue Cogentin 1 mg PO/IM twice daily for EPS prophylaxis -Continue Ativan 1 mg PO/IM for psychosis -We will consider transitioning to either Abilify or Zyprexa tomorrow if patient continues to attribute reported side effects with Haldol -ECG pending -No roommate order  Medical Problems Tobacco Use Disorder -NRT with patch  PRNs Tylenol 650 mg for mild pain Maalox/Mylanta 30 mL for indigestion Hydroxyzine 25 mg tid for anxiety Milk of Magnesia 30 mL for constipation Trazodone 50 mg for sleep   4. Discharge Planning: Social work and case management to assist with discharge planning and identification of hospital follow-up needs prior to discharge Estimated LOS: 7-10 days Discharge Concerns: Need to establish a safety plan; Medication compliance and effectiveness Discharge Goals: Return home with outpatient referrals for mental health follow-up including medication management/psychotherapy     Park Pope, MD 07/06/2022, 10:55 AM

## 2022-07-06 NOTE — Progress Notes (Signed)
Patient alert, oriented and able to make needs known. Patient notably agitated on the unit when making phone calls today, regarding medications. Pt denies SI/HI/AVH. Pt compliant with medications. Denies any pain. Pt is isolative and flat. No s/s of EPS. Will continue q7min checks and provide verbal encouragement as needed.

## 2022-07-06 NOTE — Progress Notes (Signed)
Adult Psychoeducational Group Note  Date:  07/06/2022 Time:  9:30 PM  Group Topic/Focus:  Wrap-Up Group:   The focus of this group is to help patients review their daily goal of treatment and discuss progress on daily workbooks.  Participation Level:  Did Not Attend  Participation Quality:   N/A  Affect:   N/A  Cognitive:   N/A  Insight: N/A  Engagement in Group:   N/A  Modes of Intervention:   N/A  Additional Comments:   Pt did not attend Wrap-Up group.  Edmund Hilda Laker Thompson 07/06/2022, 9:30 PM

## 2022-07-06 NOTE — Plan of Care (Signed)
  Problem: Education: Goal: Knowledge of [facility] Education information/materials will improve Outcome: Progressing Goal: Emotional status will improve Outcome: Progressing Goal: Mental status will improve Outcome: Progressing Goal: Verbalization of understanding the information provided will improve Outcome: Progressing   Problem: Activity: Goal: Interest or engagement in activities will improve Outcome: Progressing Goal: Sleeping patterns will improve Outcome: Progressing   Problem: Coping: Goal: Ability to verbalize frustrations and anger appropriately will improve Outcome: Progressing Goal: Ability to demonstrate self-control will improve Outcome: Progressing   Problem: Health Behavior/Discharge Planning: Goal: Identification of resources available to assist in meeting health care needs will improve Outcome: Progressing Goal: Compliance with treatment plan for underlying cause of condition will improve Outcome: Progressing   Problem: Physical Regulation: Goal: Ability to maintain clinical measurements within normal limits will improve Outcome: Progressing   Problem: Safety: Goal: Periods of time without injury will increase Outcome: Progressing   Problem: Activity: Goal: Will verbalize the importance of balancing activity with adequate rest periods Outcome: Progressing   Problem: Education: Goal: Will be free of psychotic symptoms Outcome: Progressing Goal: Knowledge of the prescribed therapeutic regimen will improve Outcome: Progressing   Problem: Coping: Goal: Coping ability will improve Outcome: Progressing Goal: Will verbalize feelings Outcome: Progressing   Problem: Health Behavior/Discharge Planning: Goal: Compliance with prescribed medication regimen will improve Outcome: Progressing   Problem: Nutritional: Goal: Ability to achieve adequate nutritional intake will improve Outcome: Progressing   Problem: Role Relationship: Goal:  Ability to communicate needs accurately will improve Outcome: Progressing Goal: Ability to interact with others will improve Outcome: Progressing   Problem: Safety: Goal: Ability to redirect hostility and anger into socially appropriate behaviors will improve Outcome: Progressing Goal: Ability to remain free from injury will improve Outcome: Progressing   Problem: Self-Care: Goal: Ability to participate in self-care as condition permits will improve Outcome: Progressing   Problem: Self-Concept: Goal: Will verbalize positive feelings about self Outcome: Progressing   

## 2022-07-06 NOTE — Group Note (Signed)
[facility] LCSW Group Therapy Note  Date/Time:  07/06/2022  11:00AM-12:00PM  Type of Therapy and Topic:  Group Therapy:  Music and Mood  Participation Level:  Did Not Attend   Description of Group: In this process group, members listened to a variety of genres of music and identified that different types of music evoke different responses.  Patients were encouraged to identify music that was soothing for them and music that was energizing for them.  Patients discussed how this knowledge can help with wellness and recovery in various ways including managing depression and anxiety as well as encouraging healthy sleep habits.    Therapeutic Goals: Patients will explore the impact of different varieties of music on mood Patients will verbalize the thoughts they have when listening to different types of music Patients will identify music that is soothing to them as well as music that is energizing to them Patients will discuss how to use this knowledge to assist in maintaining wellness and recovery Patients will explore the use of music as a coping skill  Summary of Patient Progress:  Patient was invited to group, did not attend.   Therapeutic Modalities: Solution Focused Brief Therapy Activity   Alazar Cherian Grossman-Orr, LCSW   

## 2022-07-07 NOTE — Progress Notes (Addendum)
Pt denies SI/HI/AVH and verbally agrees to approach staff if these become apparent or before harming themselves/others. Rates depression 0/10. Rates anxiety 3/10. Rates pain 0/10. Pt has been in his room for most of the day. Pt has been going for meals but not going to groups. Pt has not been wearing his watch as tight. Scheduled medications administered to pt, per MD orders. RN provided support and encouragement to pt. Q15 min safety checks implemented and continued. Pt safe on the unit. RN will continue to monitor and intervene as needed.   07/07/22 0828  Psych Admission Type (Psych Patients Only)  Admission Status Involuntary  Psychosocial Assessment  Patient Complaints Anxiety  Eye Contact Fair  Facial Expression Flat  Affect Preoccupied  Speech Tangential  Interaction Isolative;Guarded  Motor Activity Other (Comment) (WDL)  Appearance/Hygiene In hospital gown  Behavior Characteristics Cooperative;Appropriate to situation;Guarded  Mood Anxious;Preoccupied;Pleasant  Thought Process  Coherency Circumstantial  Content Paranoia  Delusions Paranoid  Perception WDL  Hallucination None reported or observed  Judgment Impaired  Confusion None  Danger to Self  Current suicidal ideation? Denies  Danger to Others  Danger to Others None reported or observed

## 2022-07-07 NOTE — Progress Notes (Signed)
   07/06/22 2010  Psych Admission Type (Psych Patients Only)  Admission Status Involuntary  Psychosocial Assessment  Patient Complaints None  Eye Contact Avoids  Facial Expression Flat  Affect Preoccupied  Speech Tangential  Interaction Isolative  Motor Activity Other (Comment) (pt lying in bed resting)  Appearance/Hygiene In hospital gown  Behavior Characteristics Appropriate to situation  Mood Preoccupied  Thought Process  Coherency Circumstantial  Content UTA  Delusions UTA  Perception UTA  Hallucination UTA  Judgment Poor  Confusion UTA  Danger to Self  Current suicidal ideation? Denies

## 2022-07-07 NOTE — Progress Notes (Signed)
   07/07/22 2045  Psych Admission Type (Psych Patients Only)  Admission Status Involuntary  Psychosocial Assessment  Patient Complaints Anxiety  Eye Contact Fair  Facial Expression Anxious  Affect Preoccupied  Speech Slow;Logical/coherent  Interaction Guarded  Motor Activity Slow  Appearance/Hygiene In hospital gown  Behavior Characteristics Cooperative  Mood Preoccupied  Aggressive Behavior  Effect No apparent injury  Thought Process  Coherency Circumstantial  Content Preoccupation;Paranoia  Delusions Paranoid  Perception WDL  Hallucination None reported or observed  Judgment Impaired  Confusion None  Danger to Self  Current suicidal ideation? Denies  Danger to Others  Danger to Others None reported or observed

## 2022-07-07 NOTE — Progress Notes (Signed)
[facility] MD Progress Note  07/07/2022 6:13 PM Lucas Knapp  MRN:  [id number] Principal Problem: Schizophrenia (HCC) Diagnosis: Principal Problem:   Schizophrenia (HCC)    Lucas Knapp is a 36 y.o. male with history of paranoid schizophrenia who presented to [facility] for psychotic symptom relapse secondary to medication noncompliance.  Patient has since been IVC'd due to refusal to initiate psychotropic medications.   PRN medication given within the last 24 hours: -Naproxen (1X)  Per Nursing: Patient slept 9.25 hours.  No documented behavioral issues and inconsistently attended group meetings.  Blood pressure was documented to be 127/66 this morning.  HPI On of assessment today, patient was evaluated at bedside.  He described his mood as "worried".  During initial part of assessment, patient was appropriate and logical regarding responses to questions; however, patient progressively became tangential and perseverative, rambling about how he needed to be discharged in order to work and "pay bills".  He also notes that he speaks to God frequently and that God speaks back regarding government conspiracies and "people that want me in the ground dead".  He denies hearing from God today.  Patient exhibited delusional thought processes.  Patient denies suicidal ideation, homicidal ideation and audiovisual hallucinations.  Patient reports improvements in symptoms compared to yesterday, but continues to report that the haloperidol is giving him a rash.  A constellation of 4 small red dots were noted on his right forearm, he denied pruritus. Patient was amenable to continuing currently prescribed medication regiment.  He denies any other side effects.     Total Time spent with patient: 30 minutes  Past Medical History:  Past Medical History:  Diagnosis Date   Back pain    Bipolar 1 disorder (HCC)    Hyperlipidemia    Hypertension    pt has been prescribed HCTZ 12.5 mg daily. Pt was 140/80 on  admission.    Schizophrenia (HCC)     History reviewed. No pertinent surgical history. Family History:  Family History  Problem Relation Age of Onset   Hypertension Father    Hyperlipidemia Father    Hypertension Paternal Grandmother    Hypertension Paternal Grandfather    Hypertension Brother        identical twin   Psychosis Brother        "mental issues"      Social History:  Social History   Substance and Sexual Activity  Alcohol Use Yes   Alcohol/week: 4.0 standard drinks of alcohol   Types: 4 Cans of beer per week   Comment: occasional alcohol use roughly 2x weekly, 1-2 drinks per event     Social History   Substance and Sexual Activity  Drug Use Yes   Types: Marijuana   Comment: Pt refused to comment on drug use    Social History   Socioeconomic History   Marital status: Single    Spouse name: Not on file   Number of children: Not on file   Years of education: Not on file   Highest education level: Not on file  Occupational History   Not on file  Tobacco Use   Smoking status: Every Day    Types: Cigarettes   Smokeless tobacco: Never   Tobacco comments:    10-15  Vaping Use   Vaping Use: Not on file  Substance and Sexual Activity   Alcohol use: Yes    Alcohol/week: 4.0 standard drinks of alcohol    Types: 4 Cans of beer per week    Comment: occasional  alcohol use roughly 2x weekly, 1-2 drinks per event   Drug use: Yes    Types: Marijuana    Comment: Pt refused to comment on drug use   Sexual activity: Not Currently  Other Topics Concern   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    Single   No children   Completed college (bachelors in Statistician)   Enjoys swimming, playing pool, walking   Grew up [place].  Has identical twin and 2 other brothers in [place]      06/2022: Homeless      07/03/22: Pt says he lives alone and works but  doesn't specify his job. Pt denies social support. Has psychiatric services with Gretchen Short, NP.   Social Determinants of Health   Financial Resource Strain: Not on file  Food Insecurity: Not on file  Transportation Needs: Not on file  Physical Activity: Not on file  Stress: Not on file  Social Connections: Not on file   Additional Social History:                         Current Medications: Current Facility-Administered Medications  Medication Dose Route Frequency Provider Last Rate Last Admin   alum & mag hydroxide-simeth (MAALOX/MYLANTA) 200-200-20 MG/5ML suspension 30 mL  30 mL Oral Q4H PRN Sindy Guadeloupe, NP       amLODipine (NORVASC) tablet 5 mg  5 mg Oral Daily Park Pope, MD   5 mg at 07/07/22 1610   benztropine (COGENTIN) tablet 1 mg  1 mg Oral BID Carlyn Reichert, MD   1 mg at 07/07/22 1642   Or   benztropine mesylate (COGENTIN) injection 1 mg  1 mg Intramuscular BID Carlyn Reichert, MD       diphenhydrAMINE (BENADRYL) capsule 50 mg  50 mg Oral Q6H PRN Carrion-Carrero, Sahas Sluka, MD       Or   diphenhydrAMINE (BENADRYL) injection 50 mg  50 mg Intramuscular Q6H PRN Carrion-Carrero, Nicklos Gaxiola, MD       haloperidol (HALDOL) tablet 5 mg  5 mg Oral Q8H PRN Carrion-Carrero, Adeline Petitfrere, MD       Or   haloperidol lactate (HALDOL) injection 5 mg  5 mg Intramuscular Q8H PRN Carrion-Carrero, Shaasia Odle, MD       haloperidol (HALDOL) tablet 5 mg  5 mg Oral BID Carlyn Reichert, MD   5 mg at 07/07/22 1641   Or   haloperidol lactate (HALDOL) injection 5 mg  5 mg Intramuscular BID Carlyn Reichert, MD       LORazepam (ATIVAN) tablet 1 mg  1 mg Oral BID Carlyn Reichert, MD   1 mg at 07/07/22 1642   Or   LORazepam (ATIVAN) injection 1 mg  1 mg Intramuscular BID Carlyn Reichert, MD       LORazepam (ATIVAN) tablet 2 mg  2 mg Oral Q6H PRN Carrion-Carrero, Cleophus Mendonsa, MD       Or   LORazepam (ATIVAN) injection 2 mg  2 mg Intramuscular Q6H PRN Carrion-Carrero, Yasmene Salomone, MD       magnesium  hydroxide (MILK OF MAGNESIA) suspension 30 mL  30 mL Oral Daily PRN Sindy Guadeloupe, NP       naproxen (NAPROSYN) tablet 250 mg  250 mg Oral Q6H PRN Carlyn Reichert, MD   250 mg at 07/06/22 1725   traZODone (DESYREL) tablet 50 mg  50 mg Oral QHS PRN Sindy Guadeloupe, NP        Lab Results:  No results found for this or any previous visit (from the past 24 hour(s)).  Blood Alcohol level:  Lab Results  Component Value Date   ETH <10 07/03/2022   ETH <10 04/17/2022    Metabolic Disorder Labs: Lab Results  Component Value Date   HGBA1C 5.2 07/03/2022   MPG 102.54 07/03/2022   MPG 96.8 12/22/2018   Lab Results  Component Value Date   PROLACTIN 30.0 (H) 07/29/2018   Lab Results  Component Value Date   CHOL 232 (H) 07/03/2022   TRIG 265 (H) 07/03/2022   HDL 38 (L) 07/03/2022   CHOLHDL 6.1 07/03/2022   VLDL 53 (H) 07/03/2022   LDLCALC 141 (H) 07/03/2022   LDLCALC 91 04/17/2022    Physical Findings: AIMS: n/a (will test tomorrow)  Musculoskeletal: Strength & Muscle Tone: within normal limits Gait & Station: normal  Psychiatric Specialty Exam:  Presentation  General Appearance: Appropriate for Environment   Eye Contact:Good   Speech:Clear and Coherent; Normal Rate   Speech Volume:Normal   Handedness:Right    Mood and Affect  Mood:Euthymic   Affect:Flat    Thought Process  Thought Processes:Coherent; Disorganized   Descriptions of Associations:Tangential   Orientation:Full (Time, Place and Person)   Thought Content:Perseveration; Scattered   History of Schizophrenia/Schizoaffective disorder:Yes   Duration of Psychotic Symptoms:Greater than six months   Hallucinations:Hallucinations: None  Ideas of Reference:None   Suicidal Thoughts:Suicidal Thoughts: No  Homicidal Thoughts:Homicidal Thoughts: No   Sensorium  Memory:Immediate Fair; Recent Fair   Judgment:Fair   Insight:Fair    Executive Functions   Concentration:Poor   Attention Span:Poor   Recall:Fair   Fund of Knowledge:Fair   Language:Good    Psychomotor Activity  Psychomotor Activity:Psychomotor Activity: Normal  Assets  Assets:Communication Skills; Desire for Improvement    Sleep  Sleep:Sleep: Fair   Physical Exam: Review of Systems  Respiratory:  Negative for shortness of breath.   Cardiovascular:  Negative for chest pain.  Gastrointestinal:  Negative for abdominal pain, constipation, diarrhea, heartburn, nausea and vomiting.  Neurological:  Negative for headaches.   Blood pressure (!) 140/99, pulse 68, temperature 98 F (36.7 C), temperature source Oral, resp. rate 16, height 5\' 4"  (1.626 m), weight 89.4 kg, SpO2 100 %. Body mass index is 33.81 kg/m.   ASSESSMENT AND PLAN Barnett Elzey is a 36 y.o. male with history of paranoid schizophrenia who presented to [facility] for psychotic symptom relapse secondary to medication noncompliance.  Patient has since been IVC'd due to refusal to initiate psychotropic medications. This is hospitalization day 4.  PLAN Safety and Monitoring: Involuntary admission to inpatient psychiatric unit for safety, stabilization and treatment Daily contact with patient to assess and evaluate symptoms and progress in treatment Patient's case to be discussed in multi-disciplinary team meeting Observation Level : q15 minute checks Vital signs: q12 hours Precautions: suicide, elopement, and assault   Psychiatric Problems Paranoid Schizophrenia  -Forced medication protocol -Continue Haldol 5 mg PO/IM BID for psychosis -Continue Cogentin 1 mg PO/IM twice daily for EPS prophylaxis -Continue Ativan 1 mg PO/IM for psychosis -We will consider transitioning to either Abilify or Zyprexa tomorrow if patient continues to attribute reported side effects with Haldol -ECG pending -No roommate order  Medical Problems Tobacco Use Disorder -NRT with patch  PRNs Tylenol 650 mg for  mild pain Maalox/Mylanta 30 mL for indigestion Hydroxyzine 25 mg tid for anxiety Milk of Magnesia 30 mL  for constipation Trazodone 50 mg for sleep   4. Discharge Planning: Social work and case management to assist with discharge planning and identification of hospital follow-up needs prior to discharge Estimated LOS: 7-10 days Discharge Concerns: Need to establish a safety plan; Medication compliance and effectiveness Discharge Goals: Return home with outpatient referrals for mental health follow-up including medication management/psychotherapy     Lorri Frederick, MD 07/07/2022, 6:13 PM

## 2022-07-07 NOTE — Progress Notes (Signed)
Pt was encouraged but didn't attend orientation/goals group. ?

## 2022-07-07 NOTE — Group Note (Signed)
LCSW Group Therapy Note  Group Date: 07/07/2022 Start Time: 1300 End Time: 1400   Type of Therapy and Topic:  Group Therapy - How To Cope with Nervousness about Discharge   Participation Level:  Did Not Attend   Description of Group This process group involved identification of patients' feelings about discharge. Some of them are scheduled to be discharged soon, while others are new admissions, but each of them was asked to share thoughts and feelings surrounding discharge from the hospital. One common theme was that they are excited at the prospect of going home, while another was that many of them are apprehensive about sharing why they were hospitalized. Patients were given the opportunity to discuss these feelings with their peers in preparation for discharge.  Therapeutic Goals  Patient will identify their overall feelings about pending discharge. Patient will think about how they might proactively address issues that they believe will once again arise once they get home (i.e. with parents). Patients will participate in discussion about having hope for change.   Summary of Patient Progress:    Patient did not attend group despite encouraged participation.    Therapeutic Modalities Cognitive Behavioral Therapy   Almedia Balls 07/07/2022  4:26 PM

## 2022-07-07 NOTE — Progress Notes (Signed)
Adult Psychoeducational Group Note  Date:  07/07/2022 Time:  8:29 PM  Group Topic/Focus:  Wrap-Up Group:   The focus of this group is to help patients review their daily goal of treatment and discuss progress on daily workbooks.  Participation Level:  Did Not Attend  Participation Quality:   Did Not ATtend  Affect:   Did Not Attend  Cognitive:   Did Not Attend  Insight: None  Engagement in Group:   Did Not Attend  Modes of Intervention:   Did Not Attend  Additional Comments:  Pt was encouraged to attend wrap up group but did not attend.  Felipa Furnace 07/07/2022, 8:29 PM

## 2022-07-07 NOTE — Group Note (Signed)
Recreation Therapy Group Note   Group Topic:Coping Skills  Group Date: 07/07/2022 Start Time: 1005 End Time: 1100 Facilitators: [name], LRT,CTRS Location: [address] Dayroom   Goal Area(s) Addresses: Patient will define what a coping skill is. Patient will successfully identify positive coping skills they can use post d/c.  Patient will acknowledge benefit(s) of using learned coping skills post d/c.  Group Description:  Coping A to Z. Patient asked to identify what a coping skill is and when they use them. Patients with Clinical research associate discussed healthy versus unhealthy coping skills. Next patients were given a blank worksheet titled "Coping Skills A-Z". Patients were instructed to come up with at least one positive coping skill per letter of the alphabet, addressing a specific challenge (ex: stress, anger, anxiety, depression, grief, doubt, isolation, self-harm/suicidal thoughts, substance use). Patients were given 20 minutes to brainstorm before ideas were presented to the large group. Patients and LRT debriefed on the importance of coping skill selection based on situation and back-up plans when a skill tried is not effective.    Affect/Mood: N/A   Participation Level: Did not attend    Clinical Observations/Individualized Feedback:     Plan: Continue to engage patient in RT group sessions 2-3x/week.   [name], LRT,CTRS 07/07/2022 1:42 PM

## 2022-07-08 DIAGNOSIS — F209 Schizophrenia, unspecified: Secondary | ICD-10-CM

## 2022-07-08 MED ORDER — HALOPERIDOL 5 MG PO TABS
10.0000 mg | ORAL_TABLET | Freq: Every day | ORAL | Status: DC
  Administered 2022-07-08 – 2022-07-09 (×2): 10 mg via ORAL
  Filled 2022-07-08 (×3): qty 2

## 2022-07-08 MED ORDER — HALOPERIDOL LACTATE 5 MG/ML IJ SOLN: 10.0000 mg | Freq: Two times a day (BID) | INTRAMUSCULAR | Status: DC

## 2022-07-08 MED ORDER — HALOPERIDOL 5 MG PO TABS
5.0000 mg | ORAL_TABLET | Freq: Every day | ORAL | Status: DC
  Administered 2022-07-09 – 2022-07-10 (×2): 5 mg via ORAL
  Filled 2022-07-08 (×3): qty 1

## 2022-07-08 MED ORDER — HALOPERIDOL LACTATE 5 MG/ML IJ SOLN
10.0000 mg | Freq: Every day | INTRAMUSCULAR | Status: DC
  Filled 2022-07-08 (×3): qty 2

## 2022-07-08 MED ORDER — LORAZEPAM 0.5 MG PO TABS
0.5000 mg | ORAL_TABLET | Freq: Two times a day (BID) | ORAL | Status: DC
  Administered 2022-07-08 – 2022-07-09 (×2): 0.5 mg via ORAL
  Filled 2022-07-08 (×2): qty 1

## 2022-07-08 MED ORDER — HALOPERIDOL 5 MG PO TABS: 10.0000 mg | ORAL_TABLET | Freq: Two times a day (BID) | ORAL | Status: DC

## 2022-07-08 MED ORDER — BENZTROPINE MESYLATE 1 MG PO TABS
1.0000 mg | ORAL_TABLET | Freq: Two times a day (BID) | ORAL | Status: DC
  Administered 2022-07-08 – 2022-07-10 (×4): 1 mg via ORAL
  Filled 2022-07-08 (×6): qty 1

## 2022-07-08 MED ORDER — HALOPERIDOL LACTATE 5 MG/ML IJ SOLN
5.0000 mg | Freq: Every day | INTRAMUSCULAR | Status: DC
  Filled 2022-07-08 (×3): qty 1

## 2022-07-08 MED ORDER — BENZTROPINE MESYLATE 1 MG/ML IJ SOLN
1.0000 mg | Freq: Two times a day (BID) | INTRAMUSCULAR | Status: DC
  Filled 2022-07-08 (×6): qty 1

## 2022-07-08 MED ORDER — LORAZEPAM 2 MG/ML IJ SOLN: 0.5000 mg | Freq: Two times a day (BID) | INTRAMUSCULAR | Status: DC

## 2022-07-08 NOTE — Progress Notes (Signed)
Adult Psychoeducational Group Note  Date:  07/08/2022 Time:  9:15 PM  Group Topic/Focus:  Wrap-Up Group:   The focus of this group is to help patients review their daily goal of treatment and discuss progress on daily workbooks.  Participation Level:  Active  Participation Quality:  Appropriate  Affect:  Appropriate  Cognitive:  Appropriate  Insight: Appropriate  Engagement in Group:  Engaged  Modes of Intervention:  Discussion  Additional Comments:   Pt rated his day 4/10. Pt reported no progress toward his goal of being discharged tomorrow. Pt briefly shared concerns about his medical team. Patients rights and self-advocacy was briefly discussed with the group. Pt reportedly plans to work on talking and asking questions when he lack understanding about his medical treatment.   Lucas Knapp 07/08/2022, 9:15 PM

## 2022-07-08 NOTE — Progress Notes (Signed)
Pt was encouraged but didn't attend orientation/goals group. ?

## 2022-07-08 NOTE — Progress Notes (Signed)
   07/08/22 1100  Psych Admission Type (Psych Patients Only)  Admission Status Involuntary  Psychosocial Assessment  Patient Complaints Anxiety  Eye Contact Fair  Facial Expression Anxious  Affect Preoccupied  Speech Logical/coherent  Interaction Guarded  Motor Activity Slow  Appearance/Hygiene In hospital gown  Behavior Characteristics Appropriate to situation  Mood Preoccupied  Thought Process  Coherency Circumstantial  Content Paranoia  Delusions Paranoid  Perception WDL  Hallucination None reported or observed  Judgment Impaired  Confusion None  Danger to Self  Current suicidal ideation? Denies  Danger to Others  Danger to Others None reported or observed

## 2022-07-08 NOTE — Progress Notes (Signed)
Pt continued to express his concerns with taking Haldol, pt continued to express his desire to take another Antipsychotic. Pt appears to not be committed to taking the Haldol on D/C , but stated he would be willing to follow up on another medication. Pt encouraged to discuss it with the doctor to get an explanation .    07/08/22 2200  Psych Admission Type (Psych Patients Only)  Admission Status Involuntary  Psychosocial Assessment  Patient Complaints Anxiety  Eye Contact Fair  Facial Expression Anxious  Affect Preoccupied  Speech Slow;Logical/coherent  Interaction Guarded  Motor Activity Slow  Appearance/Hygiene In hospital gown  Behavior Characteristics Cooperative;Appropriate to situation  Mood Suspicious;Pleasant  Aggressive Behavior  Effect No apparent injury  Thought Process  Coherency Circumstantial  Content Preoccupation;Paranoia  Delusions Paranoid  Perception WDL  Hallucination None reported or observed  Judgment Impaired  Confusion None  Danger to Self  Current suicidal ideation? Denies  Danger to Others  Danger to Others None reported or observed

## 2022-07-08 NOTE — Group Note (Signed)
Recreation Therapy Group Note   Group Topic:Health and Wellness  Group Date: 07/08/2022 Start Time: 1000 End Time: 1040 Facilitators: [name], LRT,CTRS Location: [address] Dayroom   Goal Area(s) Addresses:  Patient will define components of whole wellness. Patient will verbalize benefit of whole wellness.   Group Description:  Exercise.  LRT and patients talked about the components of wellness and why they are important for the wellbeing of each individual.  LRT explained the group was going to do at least 30 minutes of exercise and they would take turns leading the group in an exercise of their choosing.  Water was provided for the patients, which they were encouraged to drink if they needed it.  Patients were also encouraged to take breaks if they were getting tired or overwhelmed.   Affect/Mood: Appropriate   Participation Level: Engaged   Participation Quality: Independent   Behavior: Appropriate   Speech/Thought Process: Focused   Insight: Good   Judgement: Good   Modes of Intervention: Music and Exercise   Patient Response to Interventions:  Engaged   Education Outcome:  Acknowledges education and In group clarification offered    Clinical Observations/Individualized Feedback: Pt participated in the exercises.  Pt was bright and attentive.  Pt appeared attentive during discussion.     Plan: Continue to engage patient in RT group sessions 2-3x/week.   [name], LRT,CTRS 07/08/2022 11:56 AM

## 2022-07-08 NOTE — Progress Notes (Addendum)
[facility] MD Progress Note  07/08/2022 3:06 PM Lucas Knapp  MRN:  [id number] Principal Problem: Schizophrenia (HCC) Diagnosis: Principal Problem:   Schizophrenia (HCC)    Lucas Knapp is a 36 y.o. male with history of paranoid schizophrenia who presented to [facility] for psychotic symptom relapse secondary to medication noncompliance.  Patient has since been IVC'd and started on forced medications due to refusal to initiate psychotropic medications.  Yesterday, the psychiatry team made following recommendations: No changes  Interval History: PRN Medications administered within the last 24 hours: naproxen x1 Per nursing staff: patient has appeared guarded and paranoid   Per Patient:  On assessment today, the patient reports a mood that is "good". He reports that "I am thinking more clearly and my thoughts are not running together". He reports good sleep, appetite, and energy. EPS exam is notable for slight rigidity in the L wrist. The patient notes no subjective rigidity and is amenable to an increase in Haldol tonight. He does state "please don't turn me into a zombie like state". The interview is notable for a lack of delusional content; the patient is linear and logical and talks about his concern for "bills piling up" at home.   Patient denies side effects to current scheduled psychiatric medications.   Patient denies other somatic complaints.   Past Medical History:  Past Medical History:  Diagnosis Date   Back pain    Bipolar 1 disorder (HCC)    Hyperlipidemia    Hypertension    pt has been prescribed HCTZ 12.5 mg daily. Pt was 140/80 on admission.    Schizophrenia (HCC)     History reviewed. No pertinent surgical history. Family History:  Family History  Problem Relation Age of Onset   Hypertension Father    Hyperlipidemia Father    Hypertension Paternal Grandmother    Hypertension Paternal Grandfather    Hypertension Brother        identical twin   Psychosis Brother         "mental issues"      Social History:  Social History   Substance and Sexual Activity  Alcohol Use Yes   Alcohol/week: 4.0 standard drinks of alcohol   Types: 4 Cans of beer per week   Comment: occasional alcohol use roughly 2x weekly, 1-2 drinks per event     Social History   Substance and Sexual Activity  Drug Use Yes   Types: Marijuana   Comment: Pt refused to comment on drug use    Social History   Socioeconomic History   Marital status: Single    Spouse name: Not on file   Number of children: Not on file   Years of education: Not on file   Highest education level: Not on file  Occupational History   Not on file  Tobacco Use   Smoking status: Every Day    Types: Cigarettes   Smokeless tobacco: Never   Tobacco comments:    10-15  Vaping Use   Vaping Use: Not on file  Substance and Sexual Activity   Alcohol use: Yes    Alcohol/week: 4.0 standard drinks of alcohol    Types: 4 Cans of beer per week    Comment: occasional alcohol use roughly 2x weekly, 1-2 drinks per event   Drug use: Yes    Types: Marijuana    Comment: Pt refused to comment on drug use   Sexual activity: Not Currently  Other Topics Concern   Not on file  Social History Narrative  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 Also works for the [employer] one day a week.    Single   No children   Completed college (bachelors in Statistician)   Enjoys swimming, playing pool, walking   Grew up [place].  Has identical twin and 2 other brothers in [place]      06/2022: Homeless      07/03/22: Pt says he lives alone and works but doesn't specify his job. Pt denies social support. Has psychiatric services with Gretchen Short, NP.   Social Determinants of Health   Financial Resource Strain: Not on file  Food Insecurity: Not on file  Transportation Needs: Not on file  Physical Activity: Not on file  Stress: Not on file  Social Connections: Not on file   Additional  Social History:                         Current Medications: Current Facility-Administered Medications  Medication Dose Route Frequency Provider Last Rate Last Admin   alum & mag hydroxide-simeth (MAALOX/MYLANTA) 200-200-20 MG/5ML suspension 30 mL  30 mL Oral Q4H PRN Sindy Guadeloupe, NP       amLODipine (NORVASC) tablet 5 mg  5 mg Oral Daily Park Pope, MD   5 mg at 07/08/22 0826   benztropine (COGENTIN) tablet 1 mg  1 mg Oral BID Carlyn Reichert, MD       Or   benztropine mesylate (COGENTIN) injection 1 mg  1 mg Intramuscular BID Carlyn Reichert, MD       diphenhydrAMINE (BENADRYL) capsule 50 mg  50 mg Oral Q6H PRN Carrion-Carrero, Margely, MD       Or   diphenhydrAMINE (BENADRYL) injection 50 mg  50 mg Intramuscular Q6H PRN Carrion-Carrero, Margely, MD       haloperidol (HALDOL) tablet 10 mg  10 mg Oral QHS Carlyn Reichert, MD       Or   haloperidol lactate (HALDOL) injection 10 mg  10 mg Intramuscular QHS Carlyn Reichert, MD       haloperidol (HALDOL) tablet 5 mg  5 mg Oral Q8H PRN Carrion-Carrero, Margely, MD       Or   haloperidol lactate (HALDOL) injection 5 mg  5 mg Intramuscular Q8H PRN Carrion-Carrero, Karle Starch, MD       [START ON 07/09/2022] haloperidol (HALDOL) tablet 5 mg  5 mg Oral Daily Carlyn Reichert, MD       Or   Melene Muller ON 07/09/2022] haloperidol lactate (HALDOL) injection 5 mg  5 mg Intramuscular Daily Carlyn Reichert, MD       LORazepam (ATIVAN) tablet 0.5 mg  0.5 mg Oral BID Carlyn Reichert, MD       Or   LORazepam (ATIVAN) injection 0.5 mg  0.5 mg Intramuscular BID Carlyn Reichert, MD       LORazepam (ATIVAN) tablet 2 mg  2 mg Oral Q6H PRN Carrion-Carrero, Margely, MD       Or   LORazepam (ATIVAN) injection 2 mg  2 mg Intramuscular Q6H PRN Carrion-Carrero, Margely, MD       magnesium hydroxide (MILK OF MAGNESIA) suspension 30 mL  30 mL Oral Daily PRN Sindy Guadeloupe, NP       naproxen (NAPROSYN) tablet 250 mg  250 mg Oral Q6H PRN Carlyn Reichert, MD   250 mg at  07/07/22 1814   traZODone (DESYREL) tablet 50 mg  50 mg Oral QHS PRN Sindy Guadeloupe, NP  Lab Results:  No results found for this or any previous visit (from the past 24 hour(s)).  Blood Alcohol level:  Lab Results  Component Value Date   ETH <10 07/03/2022   ETH <10 04/17/2022    Metabolic Disorder Labs: Lab Results  Component Value Date   HGBA1C 5.2 07/03/2022   MPG 102.54 07/03/2022   MPG 96.8 12/22/2018   Lab Results  Component Value Date   PROLACTIN 30.0 (H) 07/29/2018   Lab Results  Component Value Date   CHOL 232 (H) 07/03/2022   TRIG 265 (H) 07/03/2022   HDL 38 (L) 07/03/2022   CHOLHDL 6.1 07/03/2022   VLDL 53 (H) 07/03/2022   LDLCALC 141 (H) 07/03/2022   LDLCALC 91 04/17/2022    Physical Findings: EPS exam: slight rigidity in the left wrist  Musculoskeletal: Strength & Muscle Tone: within normal limits Gait & Station: normal  Psychiatric Specialty Exam:  Presentation  General Appearance: Appropriate for Environment   Eye Contact:Good   Speech:Clear and Coherent; Normal Rate   Speech Volume:Normal   Handedness:Right    Mood and Affect  Mood:Euthymic   Affect:Flat    Thought Process  Thought Processes: coherent  Descriptions of Associations: intact  Orientation:Full (Time, Place and Person)   Thought Content: Patient denied SI/HI/AVH, delusions, paranoia, first rank symptoms. Patient is not grossly responding to internal/external stimuli on exam and did not make delusional statements.   History of Schizophrenia/Schizoaffective disorder:Yes   Duration of Psychotic Symptoms:Greater than six months   Hallucinations:Hallucinations: None  Ideas of Reference:None   Suicidal Thoughts:Suicidal Thoughts: No  Homicidal Thoughts:Homicidal Thoughts: No   Sensorium  Memory:Immediate Fair; Recent Fair   Judgment:Fair   Insight:Fair    Executive Functions  Concentration:Poor   Attention  Span:Poor   Recall:Fair   Fund of Knowledge:Fair   Language:Good    Psychomotor Activity  Psychomotor Activity:Psychomotor Activity: Normal  Assets  Assets:Communication Skills; Desire for Improvement    Sleep  Sleep:Sleep: Fair Number of Hours of Sleep: 9.25  Physical Exam Constitutional:      Appearance: the patient is not toxic-appearing.  Pulmonary:     Effort: Pulmonary effort is normal.  Neurological:     General: No focal deficit present.     Mental Status: the patient is alert and oriented to person, place, and time.   Review of Systems  Respiratory:  Negative for shortness of breath.   Cardiovascular:  Negative for chest pain.  Gastrointestinal:  Negative for abdominal pain, constipation, diarrhea, nausea and vomiting.  Neurological:  Negative for headaches.    Blood pressure (!) 135/94, pulse 63, temperature 98.1 F (36.7 C), temperature source Oral, resp. rate 16, height 5\' 4"  (1.626 m), weight 89.4 kg, SpO2 100 %. Body mass index is 33.81 kg/m.   ASSESSMENT AND PLAN Lucas Knapp is a 36 y.o. male with history of paranoid schizophrenia who presented to [facility] for psychotic symptom relapse secondary to medication noncompliance.  Patient has since been IVC'd and started on forced medications due to refusal to initiate psychotropic medications. This is hospitalization day 5.  PLAN Safety and Monitoring: Involuntary admission to inpatient psychiatric unit for safety, stabilization and treatment Daily contact with patient to assess and evaluate symptoms and progress in treatment Patient's case to be discussed in multi-disciplinary team meeting Observation Level : q15 minute checks Vital signs: q12 hours Precautions: suicide, elopement, and assault   Psychiatric Problems Paranoid Schizophrenia  -Forced medication protocol -Increase Haldol to 5 mg AM PO/IM and 10 mg PO/IM  QHS  -Will offer LAI -Continue Cogentin 1 mg PO/IM twice daily for EPS  prophylaxis -Decrease Ativan to 0.5 mg PO/IM BID for agitation  Medical Problems Tobacco Use Disorder -NRT with patch  PRNs Tylenol 650 mg for mild pain Maalox/Mylanta 30 mL for indigestion Hydroxyzine 25 mg tid for anxiety Milk of Magnesia 30 mL for constipation Trazodone 50 mg for sleep   4. Discharge Planning: Social work and case management to assist with discharge planning and identification of hospital follow-up needs prior to discharge Estimated LOS: 7-10 days Discharge Concerns: Need to establish a safety plan; Medication compliance and effectiveness Discharge Goals: Return home (to apartment) with outpatient referrals for mental health follow-up including medication management/psychotherapy     Carlyn Reichert, MD 07/08/2022, 3:06 PM

## 2022-07-08 NOTE — Progress Notes (Signed)
   07/08/22 0530  Sleep  Number of Hours 7.5

## 2022-07-09 ENCOUNTER — Encounter (HOSPITAL_COMMUNITY): Payer: Self-pay

## 2022-07-09 MED ORDER — LORAZEPAM 2 MG/ML IJ SOLN: 0.2500 mg | Freq: Two times a day (BID) | INTRAMUSCULAR | Status: DC

## 2022-07-09 MED ORDER — LORAZEPAM 0.5 MG PO TABS
0.2500 mg | ORAL_TABLET | Freq: Two times a day (BID) | ORAL | Status: DC
  Administered 2022-07-09 – 2022-07-10 (×2): 0.25 mg via ORAL
  Filled 2022-07-09 (×2): qty 1

## 2022-07-09 NOTE — Progress Notes (Signed)
   07/09/22 0900  Psych Admission Type (Psych Patients Only)  Admission Status Involuntary  Psychosocial Assessment  Patient Complaints Anxiety  Eye Contact Fair  Facial Expression Anxious  Affect Preoccupied  Speech Logical/coherent  Interaction Cautious  Motor Activity Slow  Appearance/Hygiene In hospital gown  Behavior Characteristics Cooperative;Appropriate to situation;Calm  Mood Suspicious;Pleasant  Aggressive Behavior  Effect No apparent injury  Thought Process  Coherency Circumstantial  Content Preoccupation  Delusions Paranoid  Perception WDL  Hallucination None reported or observed  Judgment Impaired  Confusion None  Danger to Self  Current suicidal ideation? Denies  Danger to Others  Danger to Others None reported or observed

## 2022-07-09 NOTE — Progress Notes (Signed)
Adult Psychoeducational Group Note  Date:  07/09/2022 Time:  10:08 PM  Group Topic/Focus:  Wrap-Up Group:   The focus of this group is to help patients review their daily goal of treatment and discuss progress on daily workbooks.  Participation Level:  Active  Participation Quality:  Appropriate  Affect:  Appropriate  Cognitive:  Appropriate  Insight: Appropriate  Engagement in Group:  Developing/Improving  Modes of Intervention:  Discussion  Additional Comments:  Pt stated his goal for today was to focus on his treatment plan. Pt stated he accomplished his goal today. Pt stated he talked with his doctor and social worker about his care today. Pt rated his overall day a 9 out of 10. Pt stated he was able to contact. Pt stated he felt better about himself today. Pt stated he was able to attend all meals. Pt stated he took all medications provided today. Pt stated he attend all groups held today. Pt stated his appetite was pretty good today. Pt rated sleep last night was pretty good. Pt stated the goal tonight was to get some rest. Pt stated he had some physical pain tonight. Pt stated he had some moderate pain in his lower back. Pt rated the moderate pain in his lower back a 5 on the pain level scale. Pt nurse was update on the situation. Pt deny visual hallucinations and auditory issues tonight. Pt denies thoughts of harming himself or others. Pt stated he would alert staff if anything changed  Lucas Knapp 07/09/2022, 10:08 PM

## 2022-07-09 NOTE — Progress Notes (Signed)
Pt stated he was having an issue with the Haldol making him sleepy, pt stated that will affect him going to work. Pt encouraged to talk to the doctor to see if the medications could be changed to HS to help him sleep and possibly cut down on daytime drowsiness.

## 2022-07-09 NOTE — Progress Notes (Addendum)
[facility] MD Progress Note  07/09/2022 5:19 PM Lucas Knapp  MRN:  [id number] Principal Problem: Schizophrenia (HCC) Diagnosis: Principal Problem:   Schizophrenia (HCC)    Lucas Knapp is a 36 y.o. male with history of paranoid schizophrenia who presented to [facility] for psychotic symptom relapse secondary to medication noncompliance.  Patient has since been IVC'd and started on forced medications due to refusal to initiate psychotropic medications.  Yesterday, the psychiatry team made following recommendations: Increase Haldol to 5 mg AM / 10 mg QHS for psychosis  Interval History: PRN Medications administered within the last 24 hours: naproxen x1 Per nursing staff: patient engaged well in group   Per Patient:  On assessment today, the patient reports a mood that is " good".  The patient is asked to recount what brought him into the psychiatric hospital.  The patient has able to give a coherent account, albeit one that diminishes the psychotic symptoms he had.  He reports appetite and sleep that are "fine".  He is offered an LAI of Haldol, which the patient refuses.  He states that he has tried [employer] multiple times that he does not like them.  He is asked again why he discontinued Haldol previously, and is unable to state why.  He is able to answer commonsense questions, such as what he would do in the case of an emergency, and how he makes his car payments.  His thought process are greatly improved from admission.  He denies suicidal thoughts and homicidal thoughts.  He denies auditory visual hallucinations and denies ideas of reference or first rank symptoms.He expressed desire for d/c tomorrow so he can return to work.  Patient denies side effects to current scheduled psychiatric medications.   Patient denies other somatic complaints.   Past Medical History:  Past Medical History:  Diagnosis Date   Back pain    Bipolar 1 disorder (HCC)    Hyperlipidemia    Hypertension    pt has been  prescribed HCTZ 12.5 mg daily. Pt was 140/80 on admission.    Schizophrenia (HCC)     History reviewed. No pertinent surgical history. Family History:  Family History  Problem Relation Age of Onset   Hypertension Father    Hyperlipidemia Father    Hypertension Paternal Grandmother    Hypertension Paternal Grandfather    Hypertension Brother        identical twin   Psychosis Brother        "mental issues"      Social History:  Social History   Substance and Sexual Activity  Alcohol Use Yes   Alcohol/week: 4.0 standard drinks of alcohol   Types: 4 Cans of beer per week   Comment: occasional alcohol use roughly 2x weekly, 1-2 drinks per event     Social History   Substance and Sexual Activity  Drug Use Yes   Types: Marijuana   Comment: Pt refused to comment on drug use    Social History   Socioeconomic History   Marital status: Single    Spouse name: Not on file   Number of children: Not on file   Years of education: Not on file   Highest education level: Not on file  Occupational History   Not on file  Tobacco Use   Smoking status: Every Day    Types: Cigarettes   Smokeless tobacco: Never   Tobacco comments:    10-15  Vaping Use   Vaping Use: Not on file  Substance and Sexual Activity  Alcohol use: Yes    Alcohol/week: 4.0 standard drinks of alcohol    Types: 4 Cans of beer per week    Comment: occasional alcohol use roughly 2x weekly, 1-2 drinks per event   Drug use: Yes    Types: Marijuana    Comment: Pt refused to comment on drug use   Sexual activity: Not Currently  Other Topics Concern   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    Single   No children   Completed college (bachelors in Statistician)   Enjoys swimming, playing pool, walking   Grew up [place].  Has identical twin and 2 other brothers in [place]      06/2022: Homeless       07/03/22: Pt says he lives alone and works but doesn't specify his job. Pt denies social support. Has psychiatric services with Gretchen Short, NP.   Social Determinants of Health   Financial Resource Strain: Not on file  Food Insecurity: Not on file  Transportation Needs: Not on file  Physical Activity: Not on file  Stress: Not on file  Social Connections: Not on file   Additional Social History:                         Current Medications: Current Facility-Administered Medications  Medication Dose Route Frequency Provider Last Rate Last Admin   alum & mag hydroxide-simeth (MAALOX/MYLANTA) 200-200-20 MG/5ML suspension 30 mL  30 mL Oral Q4H PRN Sindy Guadeloupe, NP       amLODipine (NORVASC) tablet 5 mg  5 mg Oral Daily Park Pope, MD   5 mg at 07/09/22 0805   benztropine (COGENTIN) tablet 1 mg  1 mg Oral BID Carlyn Reichert, MD   1 mg at 07/09/22 0805   Or   benztropine mesylate (COGENTIN) injection 1 mg  1 mg Intramuscular BID Carlyn Reichert, MD       diphenhydrAMINE (BENADRYL) capsule 50 mg  50 mg Oral Q6H PRN Carrion-Carrero, Margely, MD       Or   diphenhydrAMINE (BENADRYL) injection 50 mg  50 mg Intramuscular Q6H PRN Carrion-Carrero, Margely, MD       haloperidol (HALDOL) tablet 10 mg  10 mg Oral QHS Carlyn Reichert, MD   10 mg at 07/08/22 2037   Or   haloperidol lactate (HALDOL) injection 10 mg  10 mg Intramuscular QHS Carlyn Reichert, MD       haloperidol (HALDOL) tablet 5 mg  5 mg Oral Q8H PRN Carrion-Carrero, Margely, MD       Or   haloperidol lactate (HALDOL) injection 5 mg  5 mg Intramuscular Q8H PRN Carrion-Carrero, Margely, MD       haloperidol (HALDOL) tablet 5 mg  5 mg Oral Daily Carlyn Reichert, MD   5 mg at 07/09/22 1610   Or   haloperidol lactate (HALDOL) injection 5 mg  5 mg Intramuscular Daily Carlyn Reichert, MD       LORazepam (ATIVAN) tablet 0.25 mg  0.25 mg Oral BID Carlyn Reichert, MD       Or   LORazepam (ATIVAN) injection 0.25 mg  0.25 mg  Intramuscular BID Carlyn Reichert, MD       LORazepam (ATIVAN) tablet 2 mg  2 mg Oral Q6H PRN Lorri Frederick, MD       Or   LORazepam (ATIVAN)  injection 2 mg  2 mg Intramuscular Q6H PRN Carrion-Carrero, Margely, MD       magnesium hydroxide (MILK OF MAGNESIA) suspension 30 mL  30 mL Oral Daily PRN Sindy Guadeloupe, NP       naproxen (NAPROSYN) tablet 250 mg  250 mg Oral Q6H PRN Carlyn Reichert, MD   250 mg at 07/09/22 0809   traZODone (DESYREL) tablet 50 mg  50 mg Oral QHS PRN Sindy Guadeloupe, NP        Lab Results:  No results found for this or any previous visit (from the past 24 hour(s)).  Blood Alcohol level:  Lab Results  Component Value Date   ETH <10 07/03/2022   ETH <10 04/17/2022    Metabolic Disorder Labs: Lab Results  Component Value Date   HGBA1C 5.2 07/03/2022   MPG 102.54 07/03/2022   MPG 96.8 12/22/2018   Lab Results  Component Value Date   PROLACTIN 30.0 (H) 07/29/2018   Lab Results  Component Value Date   CHOL 232 (H) 07/03/2022   TRIG 265 (H) 07/03/2022   HDL 38 (L) 07/03/2022   CHOLHDL 6.1 07/03/2022   VLDL 53 (H) 07/03/2022   LDLCALC 141 (H) 07/03/2022   LDLCALC 91 04/17/2022    Physical Findings: Musculoskeletal: Strength & Muscle Tone: within normal limits Gait & Station: normal  Psychiatric Specialty Exam:  Presentation  General Appearance: Appropriate for Environment   Eye Contact:Good   Speech:Clear and Coherent; Normal Rate   Speech Volume:Normal   Handedness:Right    Mood and Affect  Mood:Euthymic   Affect: constricted   Thought Process  Thought Processes: coherent  Descriptions of Associations: intact  Orientation:Full (Time, Place and Person)   Thought Content: Patient denied SI/HI/AVH, delusions, paranoia, first rank symptoms. Patient is not grossly responding to internal/external stimuli on exam and did not make delusional statements.   History of Schizophrenia/Schizoaffective  disorder:Yes   Duration of Psychotic Symptoms:Greater than six months   Hallucinations: denies  Ideas of Reference:None   Suicidal Thoughts: denies  Homicidal Thoughts: denies   Sensorium  Memory:Immediate Fair; Recent Fair   Judgment:Fair   Insight:Fair    Executive Functions  Concentration:Fair   Attention Span:Fair   Recall:Fair   Fund of Knowledge:Fair   Language:Good    Psychomotor Activity  Psychomotor Activity: normal - no stiffness, no tremor, no akathisias, no cogwheeling AIMS 0  Assets  Assets:Communication Skills; Desire for Improvement    Sleep  6.5 hours  Physical Exam Constitutional:      Appearance: the patient is not toxic-appearing.  Pulmonary:     Effort: Pulmonary effort is normal.  Neurological:     General: No focal deficit present.     Mental Status: the patient is alert and oriented to person, place, and time.   Review of Systems  Respiratory:  Negative for shortness of breath.   Cardiovascular:  Negative for chest pain.  Gastrointestinal:  Negative for abdominal pain, constipation, diarrhea, nausea and vomiting.  Neurological:  Negative for headaches.    Blood pressure 108/84, pulse 70, temperature 98.6 F (37 C), temperature source Oral, resp. rate 18, height 5\' 4"  (1.626 m), weight 89.4 kg, SpO2 98 %. Body mass index is 33.81 kg/m.   ASSESSMENT AND PLAN Aldine Grainger is a 36 y.o. male with history of paranoid schizophrenia who presented to [facility] for psychotic symptom relapse secondary to medication noncompliance.  Patient has since been IVC'd and started on forced medications due to refusal to initiate psychotropic medications. This is  hospitalization day 6.  PLAN Safety and Monitoring: Involuntary admission to inpatient psychiatric unit for safety, stabilization and treatment Daily contact with patient to assess and evaluate symptoms and progress in treatment Patient's case to be discussed in  multi-disciplinary team meeting Observation Level : q15 minute checks Vital signs: q12 hours Precautions: suicide, elopement, and assault   Psychiatric Problems Paranoid Schizophrenia  -Forced medication protocol -Continue Haldol 5 mg AM PO and 10 mg POQHS (discussed consolidation to qhs dosing which he agrees to consider if he has daytime sedation after discharge)  -Patient declined LAI  - QTC , A1c 5.2, cholesterol 232 with LDL 141 -Continue Cogentin 1 mg PO/IM twice daily for EPS prophylaxis -Decrease Ativan to 0.25 mg PO BID for agitation with plans to taper off prior to discharge  Medical Problems Tobacco Use Disorder -NRT with patch  Sinus [place]on Foley 59bpm with RSR" in V1 and early repolarization - currently asymptomatic - will need PCP f/u after discharge (RSR pattern appears present in old EKGs when compared to previous tracings)  Hyperlipidemia - elevated cholesterol and triglycerides on non-fasting lab - will need f/u with PCP for monitoring while on antipsychotic after discharge  PRNs Tylenol 650 mg for mild pain Maalox/Mylanta 30 mL for indigestion Hydroxyzine 25 mg tid for anxiety Milk of Magnesia 30 mL for constipation Trazodone 50 mg for sleep   4. Discharge Planning: Social work and case management to assist with discharge planning and identification of hospital follow-up needs prior to discharge Estimated LOS: 7-10 days Discharge Concerns: Need to establish a safety plan; Medication compliance and effectiveness Discharge Goals: Return home (to apartment) with outpatient referrals for mental health follow-up including medication management/psychotherapy Discharge 7/20     Carlyn Reichert, MD 07/09/2022, 5:19 PM

## 2022-07-09 NOTE — Progress Notes (Signed)
Pt was encouraged but didn't attend orientation/goals group. ?

## 2022-07-09 NOTE — Progress Notes (Signed)
   07/09/22 0515  Sleep  Number of Hours 6.5

## 2022-07-09 NOTE — Progress Notes (Signed)
   07/09/22 2200  Psych Admission Type (Psych Patients Only)  Admission Status Involuntary  Psychosocial Assessment  Patient Complaints Anxiety  Eye Contact Fair  Facial Expression Anxious  Affect Preoccupied  Speech Slow;Logical/coherent  Interaction Guarded  Motor Activity Slow  Appearance/Hygiene In hospital gown  Behavior Characteristics Cooperative  Mood Suspicious  Aggressive Behavior  Effect No apparent injury  Thought Process  Coherency Circumstantial  Content Preoccupation;Paranoia  Delusions Paranoid  Perception WDL  Hallucination None reported or observed  Judgment Impaired  Confusion None  Danger to Self  Current suicidal ideation? Denies  Danger to Others  Danger to Others None reported or observed

## 2022-07-09 NOTE — Group Note (Signed)
Recreation Therapy Group Note   Group Topic:Leisure Education  Group Date: 07/09/2022 Start Time: 1005 End Time: 1050 Facilitators: [name], LRT,CTRS Location: [address] Dayroom   Goal Area(s) Addresses:  Patient will successfully identify positive leisure and recreation activities.  Patient will acknowledge benefits of participation in healthy leisure activities post discharge.  Patient will actively work with peers toward a shared goal.   Group Description: Pictionary. In groups of 5-7, patients took turns trying to guess the picture being drawn on the board by their teammate.  If the team guessed the correct answer, they won a point.  If the team guessed wrong, the other team got a chance to steal the point. After several rounds of game play, the team with the most points were declared winners. Post-activity discussion reviewed benefits of positive recreation outlets: reducing stress, improving coping mechanisms, increasing self-esteem, and building larger support systems.   Affect/Mood: Appropriate   Participation Level: Engaged   Participation Quality: Independent   Behavior: Appropriate   Speech/Thought Process: Focused   Insight: Good   Judgement: Good   Modes of Intervention: Competitive Play   Patient Response to Interventions:  Engaged   Education Outcome:  Acknowledges education and In group clarification offered    Clinical Observations/Individualized Feedback: Pt was bright and engaged.  Pt had good interaction with peers.  Pt needed some prompting to take a turn at drawing.  Once pt got going, his was more engaging.  Pt expressed his favorite thing to do for leisure was smoke cigarettes.  Pt also expressed people don't take enough time for leisure because they are focused on bills, work, etc.     Plan: Continue to engage patient in RT group sessions 2-3x/week.   [name], LRT,CTRS  07/09/2022 2:13 PM

## 2022-07-09 NOTE — Group Note (Signed)
[facility] LCSW Group Therapy Note  Date/Time: 07/09/2022 @ 1pm  Type of Therapy and Topic:  Group Therapy:  Strengths and Qualities  Participation Level:  Active  Description of Group:    In this group patients will be asked to explore and define the terms strength ans qualities.  Patients will be guided to discuss their thoughts, feelings, and behaviors as to where strengths and qualities originate. Participants will then list some of their strengths and qualities related to each subject topic. This group will be process-oriented, with patients participating in exploration of their own experiences as well as giving and receiving support and challenge from other group members.  Therapeutic Goals: Patient will identify specific strengths related to their personal life. Patient will identify feelings, thoughts, and beliefs about strengths and qualities. Patient will identify ways their strengths have been used. . Patient will identify situations where they have helped others or made someone else happy. .  Summary of Patient Progress Patient participated in group on today. Patient was appropriate, attentive, and sharing in his experiences. Patient was able to discuss some of his feelings and behaviors related to the obstacles he has faced. Patient also shared things he feels he is good at and what he loves about his appearance. Patient interacted positively with staff and peers, and was receptive to feedback provided by staff.     Therapeutic Modalities:   Cognitive Behavioral Therapy Solution Focused Therapy Motivational Interviewing Brief Therapy   Malya Cirillo, LCSW, LCAS Clincal Social Worker  [facility]

## 2022-07-09 NOTE — BH IP Treatment Plan (Signed)
Interdisciplinary Treatment and Diagnostic Plan Update  07/09/2022 Time of Session: 10:40am  Lucas Knapp MRN: [id number]  Principal Diagnosis: Schizophrenia [facility])  Secondary Diagnoses: Principal Problem:   Schizophrenia (HCC)   Current Medications:  Current Facility-Administered Medications  Medication Dose Route Frequency Provider Last Rate Last Admin   alum & mag hydroxide-simeth (MAALOX/MYLANTA) 200-200-20 MG/5ML suspension 30 mL  30 mL Oral Q4H PRN Sindy Guadeloupe, NP       amLODipine (NORVASC) tablet 5 mg  5 mg Oral Daily Park Pope, MD   5 mg at 07/09/22 0805   benztropine (COGENTIN) tablet 1 mg  1 mg Oral BID Carlyn Reichert, MD   1 mg at 07/09/22 0805   Or   benztropine mesylate (COGENTIN) injection 1 mg  1 mg Intramuscular BID Carlyn Reichert, MD       diphenhydrAMINE (BENADRYL) capsule 50 mg  50 mg Oral Q6H PRN Carrion-Carrero, Margely, MD       Or   diphenhydrAMINE (BENADRYL) injection 50 mg  50 mg Intramuscular Q6H PRN Carrion-Carrero, Margely, MD       haloperidol (HALDOL) tablet 10 mg  10 mg Oral QHS Carlyn Reichert, MD   10 mg at 07/08/22 2037   Or   haloperidol lactate (HALDOL) injection 10 mg  10 mg Intramuscular QHS Carlyn Reichert, MD       haloperidol (HALDOL) tablet 5 mg  5 mg Oral Q8H PRN Carrion-Carrero, Margely, MD       Or   haloperidol lactate (HALDOL) injection 5 mg  5 mg Intramuscular Q8H PRN Carrion-Carrero, Margely, MD       haloperidol (HALDOL) tablet 5 mg  5 mg Oral Daily Carlyn Reichert, MD   5 mg at 07/09/22 1749   Or   haloperidol lactate (HALDOL) injection 5 mg  5 mg Intramuscular Daily Carlyn Reichert, MD       LORazepam (ATIVAN) tablet 0.5 mg  0.5 mg Oral BID Carlyn Reichert, MD   0.5 mg at 07/09/22 4496   Or   LORazepam (ATIVAN) injection 0.5 mg  0.5 mg Intramuscular BID Carlyn Reichert, MD       LORazepam (ATIVAN) tablet 2 mg  2 mg Oral Q6H PRN Carrion-Carrero, Margely, MD       Or   LORazepam (ATIVAN) injection 2 mg  2 mg Intramuscular  Q6H PRN Carrion-Carrero, Margely, MD       magnesium hydroxide (MILK OF MAGNESIA) suspension 30 mL  30 mL Oral Daily PRN Sindy Guadeloupe, NP       naproxen (NAPROSYN) tablet 250 mg  250 mg Oral Q6H PRN Carlyn Reichert, MD   250 mg at 07/09/22 0809   traZODone (DESYREL) tablet 50 mg  50 mg Oral QHS PRN Sindy Guadeloupe, NP       PTA Medications: Medications Prior to Admission  Medication Sig Dispense Refill Last Dose   divalproex (DEPAKOTE) 500 MG DR tablet Take 500 mg by mouth daily.      OLANZapine (ZYPREXA) 10 MG tablet Take 1 tablet (10 mg total) by mouth in the morning and at bedtime.       Patient Stressors: Medication change or noncompliance    Patient Strengths: Average or above average intelligence   Treatment Modalities: Medication Management, Group therapy, Case management,  1 to 1 session with clinician, Psychoeducation, Recreational therapy.   Physician Treatment Plan for Primary Diagnosis: Schizophrenia [facility]) Long Term Goal(s): Improvement in symptoms so as ready for discharge   Short Term Goals: Ability to verbalize feelings will improve Ability  to maintain clinical measurements within normal limits will improve  Medication Management: Evaluate patient's response, side effects, and tolerance of medication regimen.  Therapeutic Interventions: 1 to 1 sessions, Unit Group sessions and Medication administration.  Evaluation of Outcomes: Progressing  Physician Treatment Plan for Secondary Diagnosis: Principal Problem:   Schizophrenia (HCC)  Long Term Goal(s): Improvement in symptoms so as ready for discharge   Short Term Goals: Ability to verbalize feelings will improve Ability to maintain clinical measurements within normal limits will improve     Medication Management: Evaluate patient's response, side effects, and tolerance of medication regimen.  Therapeutic Interventions: 1 to 1 sessions, Unit Group sessions and Medication administration.  Evaluation of Outcomes:  Progressing   RN Treatment Plan for Primary Diagnosis: Schizophrenia (HCC) Long Term Goal(s): Knowledge of disease and therapeutic regimen to maintain health will improve  Short Term Goals: Ability to remain free from injury will improve, Ability to participate in decision making will improve, Ability to verbalize feelings will improve, Ability to disclose and discuss suicidal ideas, and Ability to identify and develop effective coping behaviors will improve  Medication Management: RN will administer medications as ordered by provider, will assess and evaluate patient's response and provide education to patient for prescribed medication. RN will report any adverse and/or side effects to prescribing provider.  Therapeutic Interventions: 1 on 1 counseling sessions, Psychoeducation, Medication administration, Evaluate responses to treatment, Monitor vital signs and CBGs as ordered, Perform/monitor CIWA, COWS, AIMS and Fall Risk screenings as ordered, Perform wound care treatments as ordered.  Evaluation of Outcomes: Progressing   LCSW Treatment Plan for Primary Diagnosis: Schizophrenia (HCC) Long Term Goal(s): Safe transition to appropriate next level of care at discharge, Engage patient in therapeutic group addressing interpersonal concerns.  Short Term Goals: Engage patient in aftercare planning with referrals and resources, Increase social support, Increase emotional regulation, Facilitate acceptance of mental health diagnosis and concerns, Identify triggers associated with mental health/substance abuse issues, and Increase skills for wellness and recovery  Therapeutic Interventions: Assess for all discharge needs, 1 to 1 time with Social worker, Explore available resources and support systems, Assess for adequacy in community support network, Educate family and significant other(s) on suicide prevention, Complete Psychosocial Assessment, Interpersonal group therapy.  Evaluation of Outcomes:  Progressing   Progress in Treatment: Attending groups: No. Participating in groups: No. Taking medication as prescribed: Yes. Toleration medication: Yes. Family/Significant other contact made: No, will contact:  CSW will obtain consent to reach out to collateral.  Patient understands diagnosis: Yes. Discussing patient identified problems/goals with staff: Yes. Medical problems stabilized or resolved: Yes. Denies suicidal/homicidal ideation: No. Issues/concerns per patient self-inventory: Yes. Other: none   New problem(s) identified: No, Describe:  none   New Short Term/Long Term Goal(s): Patient to work towards elimination of symptoms of psychosis, medication management for mood stabilization; development of comprehensive mental wellness plan.   Patient Goals: Patient states "not being treated like a clone . Marland Kitchen Start investigating this place . . . I am taking a pill I do not know. They are drilling into my leg."        Discharge Plan or Barriers:  No psychosocial barriers identified at this time, patient to return to place of residence when appropriate for discharge.     Reason for Continuation of Hospitalization: Other; describe psychosis   Estimated Length of Stay: 1-7 days     Last 3 [place] Suicide Severity Risk Score: Flowsheet Row [facility] ED from 07/02/2022 in [facility] ED from [facility]17/2022 in [facility]  C-SSRS RISK CATEGORY No Risk Error: Question 6 not populated No Risk       Last PHQ 2/9 Scores:    09/09/2021   11:21 AM 08/02/2021   10:43 AM 05/02/2021   11:08 AM  Depression screen PHQ 2/9  Decreased Interest 0 0 0  Down, Depressed, Hopeless 0 0 0  PHQ - 2 Score 0 0 0  Altered sleeping 0 3 0  Tired, decreased energy 0 1 0  Change in appetite 0 0 0  Feeling bad or failure about yourself  0 1 0  Trouble concentrating 1 0 0   Moving slowly or fidgety/restless 0 0 0  Suicidal thoughts 2 0 0  PHQ-9 Score 3 5 0  Difficult doing work/chores Somewhat difficult  Not difficult at all    Scribe for Treatment Team: Aram Beecham, Theresia Majors 07/09/2022 10:55 AM

## 2022-07-09 NOTE — [facility] Suicide Risk Assessment (Signed)
[facility] INPATIENT:  Family/Significant Other Suicide Prevention Education  Suicide Prevention Education:  Education Completed; Tonye Becket, brother, [phone]   (name of family member/significant other) has been identified by the patient as the family member/significant other with whom the patient will be residing, and identified as the person(s) who will aid the patient in the event of a mental health crisis (suicidal ideations/suicide attempt).  With written consent from the patient, the family member/significant other has been provided the following suicide prevention education, prior to the and/or following the discharge of the patient.  Brother has no additional safety concerns.  Reports that a lot of times he becomes med noncompliant due to complaints of sleepiness.  No guns or weapons and understands doctors are thinking about discharge soon.   The suicide prevention education provided includes the following: Suicide risk factors Suicide prevention and interventions National Suicide Hotline telephone number [facility] assessment telephone number [facility] Emergency Assistance 911 [facility] and/or Residential Mobile Crisis Unit telephone number  Request made of family/significant other to: Remove weapons (e.g., guns, rifles, knives), all items previously/currently identified as safety concern.   Remove drugs/medications (over-the-counter, prescriptions, illicit drugs), all items previously/currently identified as a safety concern.  The family member/significant other verbalizes understanding of the suicide prevention education information provided.  The family member/significant other agrees to remove the items of safety concern listed above.  Javione Gunawan E Vianna Venezia 07/09/2022, 3:27 PM

## 2022-07-10 MED ORDER — HALOPERIDOL 5 MG PO TABS: 5.0000 mg | ORAL_TABLET | Freq: Every day | ORAL | 0 refills | Status: DC

## 2022-07-10 MED ORDER — AMLODIPINE BESYLATE 5 MG PO TABS: 5.0000 mg | ORAL_TABLET | Freq: Every day | ORAL | 0 refills | Status: DC

## 2022-07-10 MED ORDER — HALOPERIDOL 10 MG PO TABS: 10.0000 mg | ORAL_TABLET | Freq: Every day | ORAL | 0 refills | Status: DC

## 2022-07-10 MED ORDER — BENZTROPINE MESYLATE 1 MG PO TABS
1.0000 mg | ORAL_TABLET | Freq: Two times a day (BID) | ORAL | 0 refills | Status: DC
Start: 2022-07-10 — End: 2022-07-16

## 2022-07-10 NOTE — Progress Notes (Signed)
D: Pt A & O X 3. Denies SI, HI, AVH and pain at this time. D/C home as ordered. Picked up in front of facility by Environmental manager. A: D/C instructions reviewed with pt including electronic prescriptions and follow up appointments; compliance encouraged. All belongings from locker 49 given to pt at time of departure. Scheduled medications given with verbal education and effects monitored. Safety checks maintained without incident till time of d/c.  R: Pt receptive to care. Compliant with medications when offered. Denies adverse drug reactions when assessed. Verbalized understanding related to d/c instructions. Signed belonging sheet in agreement with items received from locker. Ambulatory with a steady gait. Appears to be in no physical distress at time of departure.

## 2022-07-10 NOTE — Group Note (Signed)
Recreation Therapy Group Note   Group Topic:Communication  Group Date: 07/10/2022 Start Time: 1000 End Time: 1040 Facilitators: [name], LRT,CTRS Location: 500 Hall Dayroom   Goal Area(s) Addresses:  Patient will effectively work with peer towards shared goal.  Patient will identify skills used to make activity successful.  Patient will identify how skills used during activity can be applied to reach post d/c goals.    Group Description: [employer]. In teams of 5-6, patients were given 12 craft pipe cleaners. Using the materials provided, patients were instructed to compete again the opposing team(s) to build the tallest free-standing structure from floor level. The activity was timed; difficulty increased by Clinical research associate as Production designer, theatre/television/film continued.  Systematically resources were removed with additional directions for example, placing one arm behind their back, working in silence, and shape stipulations. LRT facilitated post-activity discussion reviewing team processes and necessary communication skills involved in completion. Patients were encouraged to reflect how the skills utilized, or not utilized, in this activity can be incorporated to positively impact support systems post discharge.   Affect/Mood: Appropriate   Participation Level: Engaged   Participation Quality: Independent   Behavior: Appropriate   Speech/Thought Process: Focused   Insight: Moderate   Judgement: Moderate   Modes of Intervention: STEM Activity   Patient Response to Interventions:  Engaged   Education Outcome:  Acknowledges education and In group clarification offered    Clinical Observations/Individualized Feedback: Pt was engaged and seemed to be into the activity.  Pt expressed it was hard because he was "puzzled on how to do it".  Pt explained he would have given more thought to the forming of the foundation to make the activity easier.  In dealing with support system,  "everything matters".  When peer spoke on not leading with emotions, pt felt emotions play a role and shouldn't be forgotten because they matter as well.  When given examples of making decisions out of anger or caring for people to the point it's taking advantage of, pt expressed being able to understand that perspective.      Plan: Continue to engage patient in RT group sessions 2-3x/week.   [name], Antonietta Jewel 07/10/2022 12:23 PM

## 2022-07-10 NOTE — [facility] Group Notes (Signed)
[facility] Group Notes:  (Nursing/MHT/Case Management/Adjunct)  Date:  07/10/2022  Time:  9:50 AM  Type of Therapy:   Orientation/Goals group  Participation Level:  Active  Participation Quality:  Appropriate  Affect:  Appropriate  Cognitive:  Appropriate  Insight:  Appropriate  Engagement in Group:  Engaged and Improving  Modes of Intervention:  Discussion, Education, Orientation, and Support  Summary of Progress/Problems: Pt goal for today is to find out his discharge plan so that he can go home.   Lucas Knapp Page 07/10/2022, 9:50 AM

## 2022-07-10 NOTE — Discharge Summary (Signed)
Physician Discharge Summary Note  Patient:  Lucas Knapp is an 36 y.o., male MRN:  [id number] DOB:  01/28/1986 Patient phone:  [phone] (home)  Patient address:   [address] [place] [id number],  Total Time Spent in Direct Patient Care on Day of Discharge: I personally spent 30 minutes on the unit in direct patient care. The direct patient care time included face-to-face time with the patient, reviewing the patient's chart, communicating with other professionals, and coordinating care. Greater than 50% of this time was spent in counseling or coordinating care with the patient regarding goals of hospitalization, psycho-education, and discharge planning needs.   Date of Admission:  07/03/2022 Date of Discharge: 07/10/2022  Reason for Admission:     Lucas Knapp is a 36 year old male with a past psychiatric history of paranoid schizophrenia who [facility] to "get back on my meds".  At that time he was noted to be psychotic and was recommended for inpatient placement.  The patient [facility] for further management.  He has now been placed under involuntary commitment.  Forced medications have been started.   Current Outpatient (Home) Medication List: Zyprexa 10 mg QHS PRN medication prior to evaluation: none   ED course: uneventful, patient was compliant and able to be verbally de-escalated when agitated Collateral Information:  Called patient's brother, with his consent, [name] [phone]. Per his reports he talks with the patient approximately once a week. Last talked with him one week ago. His parents who live in [place] (3 hrs away), call every day and check in to encourage him to take his medicine.    Called patient's mother, [name], at [phone]. States that he has been "off the wall recently" . He gets SSI but works part  time to make ends meet. He recently lost this job and that has made upset. He has a twin brother with schizophrenia. Asked what made him stop taking Haldol. They are unsure. No legal charges pending. No history of homicide or hurting others.  Explained plan for resumption of Haldol and forced meds.  She expressed understanding and agreement.   POA/Legal Guardian: none per chart review and per family   HPI:  Per chart review, the patient was last [facility].  At that time the patient carried a diagnosis of paranoid schizophrenia.  He was started on Haldol which was titrated to a dose of 10 mg in the morning and 15 mg at night.  For a long period of time, the patient [name] [facility] an outpatient basis.  The patient complained about his Haldol and the provider switched him to Zyprexa 10 mg nightly.  There is no specification for why the patient did not like his Haldol.  3 months later [facility].  They report that the patient stabilized on Abilify 10 mg.  It does not appear that he received an LAI.   On interview and assessment today on the unit, the patient exhibits severe psychosis, with grandiose delusions and disorganized speech.  The patient reports there is "a Public relations account executiveHannibal Lecter nurse who came outside and got me" and that is how he came to be at the hospital.  He reports auditory hallucinations and states something about the government giving him pills and how these  subsequently went into his skin.  When asked about suicidal thoughts, the patient reports "you got to go back to the beginning" and mentions something about "sustained life".   Unable to perform psychiatric review of symptoms due to psychosis.   Past Psychiatric Hx: Patient has a previous diagnosis of paranoid schizophrenia.  Per chart review, the patient was last admitted to the [facility] in 2019.  At that time the patient carried a diagnosis of paranoid schizophrenia.  He was started on Haldol which was titrated to a dose of 10 mg in the morning and 15 mg at night.  For a long period of time, the patient saw [name] at the [facility] on an outpatient basis.  The patient complained about his Haldol and the provider switched him to Zyprexa 10 mg nightly.  There is no specification for why the patient did not like his Haldol.  3 months later the [facility].  They report that the patient stabilized on Abilify 10 mg.  It does not appear that he received an LAI.  Outpatient provider has noted the patient has failed Invega.   Substance Abuse Hx: Unable to assess given patient's psychosis   Past Medical History: Hypertension hyperlipidemia noted in the medical record.  No home medicines.   Family History: Twin brother with a history of schizophrenia     Social History: Patient lives in [place] in an apartment.  He receives SSI for mental health reasons but works part-time to make ends meet.  Parents and brother are supportive but live 3 hours away  Principal Problem: Schizophrenia [facility]) Discharge Diagnoses: Principal Problem:   Schizophrenia [facility])   Past Psychiatric History: as above  Past Medical History:  Past Medical History:  Diagnosis Date   Back pain    Bipolar 1 disorder (HCC)    Hyperlipidemia    Hypertension    pt has been prescribed HCTZ 12.5 mg daily. Pt was 140/80 on admission.    Schizophrenia (HCC)    History reviewed. No pertinent surgical history. Family History:  Family History  Problem Relation Age of Onset   Hypertension Father    Hyperlipidemia Father    Hypertension Paternal Grandmother    Hypertension Paternal Grandfather    Hypertension Brother        identical twin   Psychosis Brother        "mental issues"     Family  Psychiatric  History: as above Social History:  Social History   Substance and Sexual Activity  Alcohol Use Yes   Alcohol/week: 4.0 standard drinks of alcohol   Types: 4 Cans of beer per week   Comment: occasional alcohol use roughly 2x weekly, 1-2 drinks per event     Social History   Substance and Sexual Activity  Drug Use Yes   Types: Marijuana   Comment: Pt refused to comment on drug use    Social History   Socioeconomic History   Marital status: Single    Spouse name: Not on file   Number of children: Not on file   Years of education: Not on file   Highest education level: Not on file  Occupational History   Not on file  Tobacco Use   Smoking status: Every Day    Types: Cigarettes   Smokeless tobacco: Never   Tobacco comments:    10-15  Vaping Use   Vaping Use: Not on file  Substance and Sexual Activity   Alcohol use: Yes    Alcohol/week: 4.0 standard drinks of alcohol    Types: 4 Cans of beer per week    Comment: occasional alcohol use roughly 2x weekly, 1-2 drinks per event   Drug use: Yes    Types: Marijuana    Comment: Pt refused to comment on drug use   Sexual activity: Not Currently  Other Topics Concern   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    Single   No children   Completed college (bachelors in Statistician)   Enjoys swimming, playing pool, walking   Grew up [place].  Has identical twin and 2 other brothers in [place]      06/2022: Homeless      07/03/22: Pt says he lives alone and works but doesn't specify his job. Pt denies social support. Has psychiatric services with Gretchen Short, NP.   Social Determinants of Health   Financial Resource Strain: Not on file  Food Insecurity: Not on file  Transportation Needs: Not on file  Physical Activity: Not on file  Stress: Not on file  Social Connections: Not on file    Hospital  Course:   HOSPITAL COURSE:   During the patient's hospitalization, patient had extensive initial psychiatric evaluation, and follow-up psychiatric evaluations every day.   Psychiatric diagnoses provided upon initial assessment:  Paranoid Schizophrenia   Patient's psychiatric medications were adjusted on admission:  Patient reported noncompliance with home Zyprexa 10 mg QHS He was started on a forced medication protocol for psychosis: Haldol 5 mg BID with Cogentin 1 mg BID and Ativan 1 mg BID   During the hospitalization, other adjustments were made to the patient's psychiatric medication regimen:  Haldol was increased to 5 mg AM / 10 mg QHS for psychosis Ativan was tapered off Cogentin was continued   Patient's care was discussed during the interdisciplinary team meeting every day during the hospitalization.   The patient denied having side effects to prescribed psychiatric medication.   Gradually, patient started adjusting to milieu. The patient was evaluated each day by a clinical provider to ascertain response to treatment. Improvement was noted by the patient's report of decreasing symptoms, improved sleep and appetite, affect, medication tolerance, behavior, and participation in unit programming.  Patient was asked each day to complete a self inventory noting mood, mental status, pain, new symptoms, anxiety and concerns.     Symptoms were reported as significantly decreased or resolved completely by discharge.    On day of discharge, the patient reports that their mood is stable. The patient denied having suicidal thoughts for more than 48 hours prior to discharge.  Patient denies having homicidal thoughts.  Patient denies having auditory hallucinations.  Patient denies any visual hallucinations or other symptoms of psychosis. The patient was motivated to continue taking medication with a goal of continued improvement in mental health.    The patient reports their target psychiatric  symptoms of psychosis responded well to the psychiatric medications, and the patient reports overall benefit other psychiatric hospitalization. Supportive psychotherapy was provided to the patient. The patient also participated in regular group therapy while hospitalized. Coping skills, problem solving as well as relaxation therapies were also part of the unit programming.   Labs were reviewed with the patient, and abnormal results were discussed with the patient.   The  patient is able to verbalize their individual safety plan to this provider.   # It is recommended to the patient to continue psychiatric medications as prescribed, after discharge from the hospital.     # It is recommended to the patient to follow up with your outpatient psychiatric provider and PCP.   # It was discussed with the patient, the impact of alcohol, drugs, tobacco have been there overall psychiatric and medical wellbeing, and total abstinence from substance use was recommended the patient.ed.   # Prescriptions provided or sent directly to preferred pharmacy at discharge. Patient agreeable to plan. Given opportunity to ask questions. Appears to feel comfortable with discharge.    # In the event of worsening symptoms, the patient is instructed to call the crisis hotline, 911 and or go to the nearest ED for appropriate evaluation and treatment of symptoms. To follow-up with primary care provider for other medical issues, concerns and or health care needs   # Patient was discharged home with a plan to follow up as noted below.     Total Time Spent in Direct Patient Care on Day of Discharge: I personally spent 30 minutes on the unit in direct patient care. The direct patient care time included face-to-face time with the patient, reviewing the patient's chart, communicating with other professionals, and coordinating care. Greater than 50% of this time was spent in counseling or coordinating care with the patient regarding  goals of hospitalization, psycho-education, and discharge planning needs.         Physical Findings: Musculoskeletal: Strength & Muscle Tone: within normal limits Gait & Station: normal   -AIMS: 0  -No orofacial movements noted -No cogwheeling or rigidity, no tremor or bradykinesia     Psychiatric Specialty Exam:   Presentation  General Appearance: Appropriate for Environment     Eye Contact:Good     Speech:Clear and Coherent; Normal Rate     Speech Volume:Normal     Handedness:Right       Mood and Affect  Mood:Euthymic     Affect: somewhat constricted, improved     Thought Process  Thought Processes: coherent   Descriptions of Associations: intact   Orientation:Full (Time, Place and Person)     Thought Content: Patient denied SI/HI/AVH, delusions, paranoia, first rank symptoms. Patient is not grossly responding to internal/external stimuli on exam and did not make delusional statements.     History of Schizophrenia/Schizoaffective disorder:Yes     Duration of Psychotic Symptoms:Greater than six months     Hallucinations: denies   Ideas of Reference:None     Suicidal Thoughts: denies   Homicidal Thoughts: denies     Sensorium  Memory:Immediate Fair; Recent Fair     Judgment:Fair     Insight:Fair       Executive Functions  Concentration:Fair     Attention Span:Fair     Recall:Fair     Fund of Knowledge:Fair     Language:Good       Psychomotor Activity  Psychomotor Activity: normal - no stiffness, no tremor, no akathisias, no cogwheeling, AIMS 0   Assets  Assets:Communication Skills; Desire for Improvement       Sleep  good   Physical Exam Constitutional:      Appearance: the patient is not toxic-appearing.  Pulmonary:     Effort: Pulmonary effort is normal.  Neurological:     General: No focal deficit present.     Mental Status: the patient is alert and oriented to person, place, and time.  Review of  Systems  Respiratory:  Negative for shortness of breath.   Cardiovascular:  Negative for chest pain.  Gastrointestinal:  Negative for abdominal pain, constipation, diarrhea, nausea and vomiting.  Neurological:  Negative for headaches.  Blood pressure 138/90, pulse 71, temperature 98.6 F (37 C), temperature source Oral, resp. rate 18, height 5\' 4"  (1.626 m), weight 89.4 kg, SpO2 100 %. Body mass index is 33.81 kg/m.   Social History   Tobacco Use  Smoking Status Every Day   Types: Cigarettes  Smokeless Tobacco Never  Tobacco Comments   10-15   Tobacco Cessation:  A prescription for an FDA-approved tobacco cessation medication provided at discharge   Blood Alcohol level:  Lab Results  Component Value Date   ETH <10 07/03/2022   ETH <10 04/17/2022    Metabolic Disorder Labs:  Lab Results  Component Value Date   HGBA1C 5.2 07/03/2022   MPG 102.54 07/03/2022   MPG 96.8 12/22/2018   Lab Results  Component Value Date   PROLACTIN 30.0 (H) 07/29/2018   Lab Results  Component Value Date   CHOL 232 (H) 07/03/2022   TRIG 265 (H) 07/03/2022   HDL 38 (L) 07/03/2022   CHOLHDL 6.1 07/03/2022   VLDL 53 (H) 07/03/2022   LDLCALC 141 (H) 07/03/2022   LDLCALC 91 04/17/2022    See Psychiatric Specialty Exam and Suicide Risk Assessment completed by Attending Physician prior to discharge.  Discharge destination:  Home  Is patient on multiple antipsychotic therapies at discharge:  No   Has Patient had three or more failed trials of antipsychotic monotherapy by history:  No  Recommended Plan for Multiple Antipsychotic Therapies: NA  Discharge Instructions     Diet - low sodium heart healthy   Complete by: As directed    Increase activity slowly   Complete by: As directed       Allergies as of 07/10/2022       Reactions   Geodon [ziprasidone Hcl] Other (See Comments)   Dry throat        Medication List     STOP taking these medications    divalproex 500 MG DR  tablet Commonly known as: DEPAKOTE   OLANZapine 10 MG tablet Commonly known as: ZYPREXA       TAKE these medications      Indication  amLODipine 5 MG tablet Commonly known as: NORVASC Take 1 tablet (5 mg total) by mouth daily. Start taking on: July 11, 2022  Indication: High Blood Pressure Disorder   benztropine 1 MG tablet Commonly known as: COGENTIN Take 1 tablet (1 mg total) by mouth 2 (two) times daily.  Indication: Extrapyramidal Reaction caused by Medications   haloperidol 10 MG tablet Commonly known as: HALDOL Take 1 tablet (10 mg total) by mouth at bedtime.  Indication: Psychosis   haloperidol 5 MG tablet Commonly known as: HALDOL Take 1 tablet (5 mg total) by mouth daily. Start taking on: July 11, 2022  Indication: Psychosis        Follow-up Information     [facility]. Go to.   Specialty: Behavioral Health Why: Appointment scheduled for Wednesday, 7/26 at 10 AM, virtually Contact information: [address] [place] [phone]        [facility] Follow up.   Why: Please call to make an appointment for primary care services Contact information: [phone] [address] [place] [phone]  Follow-up recommendations:   Activity: as tolerated   Diet: heart healthy   Other: -Follow-up with your outpatient psychiatric provider -instructions on appointment date, time, and address (location) are provided to you in discharge paperwork.   -Take your psychiatric medications as prescribed at discharge - instructions are provided to you in the discharge paperwork   -Follow-up with outpatient primary care doctor and other specialists -for management of chronic medical disease, including: hypertension.   -Testing: Follow-up with outpatient provider for abnormal lab results:  Elevated LDL (nonfasting test).    -Recommend abstinence from alcohol, tobacco, and other illicit drug use at discharge.    -If your psychiatric symptoms recur, worsen, or if you have side effects to your psychiatric medications, call your outpatient psychiatric provider, 911, [phone] or go to the nearest emergency department.  Comments:   none  Signed: Carlyn Reichert, MD 07/10/2022, 5:27 PM

## 2022-07-10 NOTE — Progress Notes (Signed)
  [facility] Adult Case Management Discharge Plan :  Will you be returning to the same living situation after discharge:  Yes,  back to apartment At discharge, do you have transportation home?: No. Patient will need to pick up his car at [facility]. CSW will provide safe transport Do you have the ability to pay for your medications: Yes,  insurance, Medicare.   Release of information consent forms completed and in the chart;  Patient's signature needed at discharge.  Patient to Follow up at:  Follow-up Information     [facility]. Go to.   Specialty: Behavioral Health Why: Please go to this provider for an assessment to obtain therapy and medication management services, on Monday or Wednesday, arrive by 7:30 am.  Services are provided on a first come, first served basis. Contact information: [address] [place] [address] [phone]                Next level of care provider has access to [facility] Link:yes  Safety Planning and Suicide Prevention discussed: Yes,  brother, Tonye Becket     Has patient been referred to the Quitline?: Patient refused referral  Patient has been referred for addiction treatment: N/A, Occasional alcohol use and marijuana use but does not qualify for addiction treatment at this time.   Termaine Roupp E Shenise Wolgamott, LCSW 07/10/2022, 9:59 AM

## 2022-07-10 NOTE — BHH Suicide Risk Assessment (Signed)
[facility] Discharge Suicide Risk Assessment   Principal Problem: Schizophrenia [facility]) Discharge Diagnoses: Principal Problem:   Schizophrenia (HCC)   Lucas Knapp is a 36 y.o. male with history of paranoid schizophrenia who presented to [facility] for psychotic symptom relapse secondary to medication noncompliance.  Patient has since been IVC'd and started on forced medications due to refusal to initiate psychotropic medications.  HOSPITAL COURSE:  During the patient's hospitalization, patient had extensive initial psychiatric evaluation, and follow-up psychiatric evaluations every day.  Psychiatric diagnoses provided upon initial assessment:  Paranoid Schizophrenia  Patient's psychiatric medications were adjusted on admission:  Patient reported noncompliance with home Zyprexa 10 mg QHS He was started on a forced medication protocol for psychosis: Haldol 5 mg BID with Cogentin 1 mg BID and Ativan 1 mg BID  During the hospitalization, other adjustments were made to the patient's psychiatric medication regimen:  Haldol was increased to 5 mg AM / 10 mg QHS for psychosis Ativan was tapered off Cogentin was continued  Patient's care was discussed during the interdisciplinary team meeting every day during the hospitalization.  The patient denied having side effects to prescribed psychiatric medication.  Gradually, patient started adjusting to milieu. The patient was evaluated each day by a clinical provider to ascertain response to treatment. Improvement was noted by the patient's report of decreasing symptoms, improved sleep and appetite, affect, medication tolerance, behavior, and participation in unit programming.  Patient was asked each day to complete a self inventory noting mood, mental status, pain, new symptoms, anxiety and concerns.    Symptoms were reported as significantly decreased or resolved completely by discharge.   On day of discharge, the patient reports that their mood is  stable. The patient denied having suicidal thoughts for more than 48 hours prior to discharge.  Patient denies having homicidal thoughts.  Patient denies having auditory hallucinations.  Patient denies any visual hallucinations or other symptoms of psychosis. The patient was motivated to continue taking medication with a goal of continued improvement in mental health.   The patient reports their target psychiatric symptoms of psychosis responded well to the psychiatric medications, and the patient reports overall benefit other psychiatric hospitalization. Supportive psychotherapy was provided to the patient. The patient also participated in regular group therapy while hospitalized. Coping skills, problem solving as well as relaxation therapies were also part of the unit programming.  Labs were reviewed with the patient, and abnormal results were discussed with the patient.  The patient is able to verbalize their individual safety plan to this provider.  # It is recommended to the patient to continue psychiatric medications as prescribed, after discharge from the hospital.    # It is recommended to the patient to follow up with your outpatient psychiatric provider and PCP.  # It was discussed with the patient, the impact of alcohol, drugs, tobacco have been there overall psychiatric and medical wellbeing, and total abstinence from substance use was recommended the patient.ed.  # Prescriptions provided or sent directly to preferred pharmacy at discharge. Patient agreeable to plan. Given opportunity to ask questions. Appears to feel comfortable with discharge.    # In the event of worsening symptoms, the patient is instructed to call the crisis hotline, 911 and or go to the nearest ED for appropriate evaluation and treatment of symptoms. To follow-up with primary care provider for other medical issues, concerns and or health care needs  # Patient was discharged home with a plan to follow up as noted  below.   Total  Time Spent in Direct Patient Care on Day of Discharge: I personally spent 30 minutes on the unit in direct patient care. The direct patient care time included face-to-face time with the patient, reviewing the patient's chart, communicating with other professionals, and coordinating care. Greater than 50% of this time was spent in counseling or coordinating care with the patient regarding goals of hospitalization, psycho-education, and discharge planning needs.     Physical Findings: Musculoskeletal: Strength & Muscle Tone: within normal limits Gait & Station: normal  -AIMS: 0  -No orofacial movements noted -No cogwheeling or rigidity, no tremor or bradykinesia    Psychiatric Specialty Exam:   Presentation  General Appearance: Appropriate for Environment     Eye Contact:Good     Speech:Clear and Coherent; Normal Rate     Speech Volume:Normal     Handedness:Right       Mood and Affect  Mood:Euthymic     Affect: somewhat constricted, improved     Thought Process  Thought Processes: coherent   Descriptions of Associations: intact   Orientation:Full (Time, Place and Person)     Thought Content: Patient denied SI/HI/AVH, delusions, paranoia, first rank symptoms. Patient is not grossly responding to internal/external stimuli on exam and did not make delusional statements.     History of Schizophrenia/Schizoaffective disorder:Yes     Duration of Psychotic Symptoms:Greater than six months     Hallucinations: denies   Ideas of Reference:None     Suicidal Thoughts: denies   Homicidal Thoughts: denies     Sensorium  Memory:Immediate Fair; Recent Fair     Judgment:Fair     Insight:Fair       Executive Functions  Concentration:Fair     Attention Span:Fair     Recall:Fair     Fund of Knowledge:Fair     Language:Good       Psychomotor Activity  Psychomotor Activity: normal - no stiffness, no tremor, no akathisias, no  cogwheeling, AIMS 0   Assets  Assets:Communication Skills; Desire for Improvement       Sleep  good   Physical Exam Constitutional:      Appearance: the patient is not toxic-appearing.  Pulmonary:     Effort: Pulmonary effort is normal.  Neurological:     General: No focal deficit present.     Mental Status: the patient is alert and oriented to person, place, and time.    Review of Systems  Respiratory:  Negative for shortness of breath.   Cardiovascular:  Negative for chest pain.  Gastrointestinal:  Negative for abdominal pain, constipation, diarrhea, nausea and vomiting.  Neurological:  Negative for headaches.  Blood pressure 138/90, pulse 71, temperature 98.6 F (37 C), temperature source Oral, resp. rate 18, height 5\' 4"  (1.626 m), weight 89.4 kg, SpO2 100 %. Body mass index is 33.81 kg/m.  Mental Status Per Nursing Assessment::   On Admission:  NA  Demographic Factors:  Male  Loss Factors: NA  Historical Factors: NA  Risk Reduction Factors:   Positive social support, Positive therapeutic relationship, and Positive coping skills or problem solving skills  Continued Clinical Symptoms:  Schizophrenia, chronic  Cognitive Features That Contribute To Risk:  None    Suicide Risk:  Mild: Patient presented with psychosis and loss of executive function.  There was no identifiable suicidal ideation and is none presently.  The patient is demonstrating good behavioral and emotional regulation.   Follow-up Information     [facility]. Go to.   Specialty: Behavioral Health  Why: Appointment scheduled for Wednesday, 7/26 at 10 AM, virtually Contact information: [place] [id number] [place] Follow up.   Why: Please call to make an appointment for primary care services Contact information: [place]  [phone]                Plan Of Care/Follow-up recommendations:  Activity: as tolerated  Diet: heart healthy  Other: -Follow-up with your outpatient psychiatric provider -instructions on appointment date, time, and address (location) are provided to you in discharge paperwork.  -Take your psychiatric medications as prescribed at discharge - instructions are provided to you in the discharge paperwork  -Follow-up with outpatient primary care doctor and other specialists -for management of chronic medical disease, including: hypertension.  -Testing: Follow-up with outpatient provider for abnormal lab results:  Elevated LDL (nonfasting test).  -Recommend abstinence from alcohol, tobacco, and other illicit drug use at discharge.   -If your psychiatric symptoms recur, worsen, or if you have side effects to your psychiatric medications, call your outpatient psychiatric provider, 911, [phone] or go to the nearest emergency department.    Corky Sox, MD 07/10/2022, 11:15 AM

## 2022-07-10 NOTE — Care Management Important Message (Signed)
Important Message  Patient Details  Name: Lucas Knapp MRN: [id number] Date of Birth: Jun 11, 1986   Medicare Important Message Given:  Yes  Patient informed of right to appeal discharge, provided phone number to [facility]. Patient expressed no interest in appealing discharge at this time. CSW will continue to monitor situation.    Sara Selvidge E Hayden Kihara, LCSW 07/10/2022, 10:05 AM

## 2022-07-10 NOTE — Progress Notes (Signed)
   07/10/22 0515  Sleep  Number of Hours 7    

## 2022-07-11 ENCOUNTER — Telehealth (HOSPITAL_COMMUNITY): Payer: Medicare HMO | Admitting: Student in an Organized Health Care Education/Training Program

## 2022-07-16 ENCOUNTER — Encounter (HOSPITAL_COMMUNITY): Payer: Self-pay | Admitting: Student in an Organized Health Care Education/Training Program

## 2022-07-16 ENCOUNTER — Telehealth (INDEPENDENT_AMBULATORY_CARE_PROVIDER_SITE_OTHER): Payer: Medicare HMO | Admitting: Student in an Organized Health Care Education/Training Program

## 2022-07-16 DIAGNOSIS — F200 Paranoid schizophrenia: Secondary | ICD-10-CM

## 2022-07-16 MED ORDER — HALOPERIDOL 5 MG PO TABS: 5.0000 mg | ORAL_TABLET | Freq: Every day | ORAL | 2 refills | Status: DC

## 2022-07-16 MED ORDER — BENZTROPINE MESYLATE 1 MG PO TABS: 1.0000 mg | ORAL_TABLET | Freq: Two times a day (BID) | ORAL | 2 refills | Status: DC

## 2022-07-16 MED ORDER — HALOPERIDOL 10 MG PO TABS: 10.0000 mg | ORAL_TABLET | Freq: Every day | ORAL | 2 refills | Status: DC

## 2022-07-16 NOTE — Progress Notes (Signed)
BH MD/PA/NP OP Progress Note  07/16/2022 11:49 AM Lucas Knapp  MRN:  [id number]  Chief Complaint: No chief complaint on file.  HPI:  Virtual Visit via Telephone Note  I connected with Lucas Knapp on 07/16/22 at 10:00 AM EDT by telephone and verified that I am speaking with the correct person using two identifiers.  Location: Patient: Home Provider: Office   I discussed the limitations, risks, security and privacy concerns of performing an evaluation and management service by telephone and the availability of in person appointments. I also discussed with the patient that there may be a patient responsible charge related to this service. The patient expressed understanding and agreed to proceed.   History of Present Illness: 36 year old male seen today for follow up psychiatric evaluation. He has a psychiatric history of paranoid schizophrenia, bipolar disorder, schizoaffective disorder, and cannabis use disorder.  Per EMR patient was recently hospitalized at [facility] and discharged on 07/10/2022 with Haldol 5 mg in the morning and 10 mg at night as well as Cogentin 1 mg twice daily.  Patient is discharging diagnoses was paranoid schizophrenia.  On assessment today patient is a bit difficult to understand him however he is able to clarify what he says when provider ask for repeat statements.  Provider did ask if patient that will be possible if he could come in person for his next appointment, patient reports that he thinks that this would be fine.  Patient reports that not only was he at [facility] in 06/2022 he was also at [facility] in late May.  Patient reports that while at [facility] he had been placed on Depakote and Zyprexa however he feels the Depakote gave him severe headaches as it has been in the past.  Patient reports that since his discharge from [facility] he has not been having any auditory visual hallucinations.  Patient reports that he can recall that he was preoccupied with with  thoughts of being enslaved but he is not bothered by these thoughts so much anymore.  Patient denies SI, HI and AVH.  Objectively patient seems a bit preoccupied with "my creator" although he is easily able to answer questions and redirect himself.  Patient thought content still a bit bizarre with maybe some delusions of grandeur.  Patient briefly referenced that he wanted to write the [facility] Board of examiners about his back pain because he believes it is from the pills he takes for his mental health.  Patient was easily redirectable with patient and provider discussing that instead of writing a letter he should consider seeing a PCP.  Patient was also recommended he could try Voltaren gel as he does believe the pain is or muscular in nature.  Patient endorses he would be open to seeing a PCP and would appreciate having their information provided.  Patient reports that since his hospitalization he is living alone and recently moved into the the place he is currently staying.  Patient reports he is still unpacking.  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  Patient reports that his appetite is stable and he does not feel that he is having adverse side effects from Haldol or Cogentin at this time currently.  Patient does endorse that he uses delta 8 on occasion and CBG for "my back pain" patient was educated to no longer continued use of these products as they may worsen his schizophrenia.  Patient reports  he is smoking cigarettes with equivalent 1 pack/day and is not currently interested in stopping.  Patient reports he is not drinking daily and endorses rather at some more "on occasion, every now and then."    I discussed the assessment and treatment plan with the patient. The patient was provided an opportunity to ask questions and all were answered. The patient agreed with the plan and demonstrated an understanding of the instructions.   The  patient was advised to call back or seek an in-person evaluation if the symptoms worsen or if the condition fails to improve as anticipated.  I provided 25 minutes of non-face-to-face time during this encounter.  PGY-3 Bobbye Morton, MD   Visit Diagnosis:    ICD-10-CM   1. Paranoid schizophrenia (HCC)  F20.0 benztropine (COGENTIN) 1 MG tablet    haloperidol (HALDOL) 10 MG tablet    haloperidol (HALDOL) 5 MG tablet      Past Psychiatric History:  paranoid schizophrenia, bipolar disorder, schizoaffective disorder, and cannabis use disorder.    Past Medical History:  Past Medical History:  Diagnosis Date   Back pain    Bipolar 1 disorder (HCC)    Hyperlipidemia    Hypertension    pt has been prescribed HCTZ 12.5 mg daily. Pt was 140/80 on admission.    Schizophrenia (HCC)    No past surgical history on file.  Family Psychiatric History: Cousin ADHD, Twin Brother history of schizophrenia and schizoaffective bipolar type  Family History:  Family History  Problem Relation Age of Onset   Hypertension Father    Hyperlipidemia Father    Hypertension Paternal Grandmother    Hypertension Paternal Grandfather    Hypertension Brother        identical twin   Psychosis Brother        "mental issues"      Social History:  Social History   Socioeconomic History   Marital status: Single    Spouse name: Not on file   Number of children: Not on file   Years of education: Not on file   Highest education level: Not on file  Occupational History   Not on file  Tobacco Use   Smoking status: Every Day    Types: Cigarettes   Smokeless tobacco: Never   Tobacco comments:    10-15  Vaping Use   Vaping Use: Not on file  Substance and Sexual Activity   Alcohol use: Yes    Alcohol/week: 4.0 standard drinks of alcohol    Types: 4 Cans of beer per week    Comment: occasional alcohol use roughly 2x weekly, 1-2 drinks per event   Drug use: Yes    Types: Marijuana    Comment: Pt  refused to comment on drug use   Sexual activity: Not Currently  Other Topics Concern   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    Single   No children   Completed college (bachelors in Statistician)   Enjoys swimming, playing pool, walking   Grew up [place].  Has identical twin and 2 other brothers in [place]      06/2022: Homeless      07/03/22: Pt says he lives alone and works but doesn't specify his job. Pt denies social support. Has psychiatric services with Gretchen Short, NP.   Social Determinants of Corporate investment banker  Strain: Not on file  Food Insecurity: Not on file  Transportation Needs: Not on file  Physical Activity: Not on file  Stress: Not on file  Social Connections: Not on file    Allergies:  Allergies  Allergen Reactions   Geodon [Ziprasidone Hcl] Other (See Comments)    Dry throat    Metabolic Disorder Labs: Lab Results  Component Value Date   HGBA1C 5.2 07/03/2022   MPG 102.54 07/03/2022   MPG 96.8 12/22/2018   Lab Results  Component Value Date   PROLACTIN 30.0 (H) 07/29/2018   Lab Results  Component Value Date   CHOL 232 (H) 07/03/2022   TRIG 265 (H) 07/03/2022   HDL 38 (L) 07/03/2022   CHOLHDL 6.1 07/03/2022   VLDL 53 (H) 07/03/2022   LDLCALC 141 (H) 07/03/2022   LDLCALC 91 04/17/2022   Lab Results  Component Value Date   TSH 1.299 07/03/2022   TSH 1.609 12/22/2018    Therapeutic Level Labs: No results found for: "LITHIUM" No results found for: "VALPROATE" Lab Results  Component Value Date   CBMZ <0.5 (L) 10/09/2012   CBMZ 2.2 (L) 09/21/2012    Current Medications: Current Outpatient Medications  Medication Sig Dispense Refill   amLODipine (NORVASC) 5 MG tablet Take 1 tablet (5 mg total) by mouth daily. 30 tablet 0   benztropine (COGENTIN) 1 MG tablet Take 1 tablet (1 mg total) by mouth 2 (two) times daily. 60  tablet 2   haloperidol (HALDOL) 10 MG tablet Take 1 tablet (10 mg total) by mouth at bedtime. 30 tablet 2   haloperidol (HALDOL) 5 MG tablet Take 1 tablet (5 mg total) by mouth daily. 30 tablet 2   No current facility-administered medications for this visit.     Musculoskeletal: Defer  Psychiatric Specialty Exam: Review of Systems  Psychiatric/Behavioral:  Negative for hallucinations, sleep disturbance and suicidal ideas.     There were no vitals taken for this visit.There is no height or weight on file to calculate BMI.  General Appearance: Defer  Eye Contact:  Defer  Speech:  Slurred and with some stammering on occassion  Volume:  Normal  Mood:  Euthymic  Affect:  NA  Thought Process:  Goal Directed  Orientation:  Full (Time, Place, and Person)  Thought Content: Ideas of Reference:   Delusions   Suicidal Thoughts:  No  Homicidal Thoughts:  No  Memory:  Immediate;   Fair  Judgement:  Fair  Insight:  Shallow  Psychomotor Activity:  NA  Concentration:  Concentration: Fair  Recall:  [employer] of Knowledge: Fair  Language: Fair  Akathisia:  NA    AIMS (if indicated): not done  Assets:  Desire for Improvement Housing Resilience  ADL's:  Intact  Cognition: ?  Sleep:  Fair   Screenings: AIMS    Flowsheet Row [facility] Clinical Support from 09/09/2021 in [facility] Admission (Discharged) [facility] Ad[facility] ([facility]) [facility]  AIMS Total Score 0 0 0 0      AUDIT    Flowsheet Row [facility]) [facility] Ad[facility] (Dis[facility]  Alcohol Use Disorder Identification Test Final Score (AUDIT) 0 0      GAD-7    Flowsheet Row  Clinical Support from 09/09/2021 in [place]  [facility] Video Visit from 08/02/2021 in [facility] Video Visit from 05/02/2021 in [facility] Video Visit from 02/04/2021 in [facility]  Total GAD-7 Score 7 6 1 2       PHQ2-9    Flowsheet Row Clinical Support from 09/09/2021 in [facility] Video Visit from 08/02/2021 in [facility] Video Visit from 05/02/2021 in [facility] Video Visit from 02/04/2021 in [facility] Office Visit from 06/28/2018 in [facility]  PHQ-2 Total Score 0 0 0 0 6  PHQ-9 Total Score 3 5 0 0 27      Flowsheet Row Ad[facility]) [facility] from 07/02/2022 in [facility] ED from [facility]/17/2022 in [facility]  C-SSRS RISK CATEGORY No Risk Error: Question 6 not populated No Risk        Assessment and Plan: Knoxx Boeding is a 36 yo patient w/ PPH of paranoid schizophrenia with 2 recent psychiatric hospitalizations.  Patient appears to be responding well to his most recent medications prescribed at discharge.  Patient continues to have a bit of bizarre thought content however again he is able to redirect himself and follow along with conversation fairly well.  Patient to be reassessed sooner rather than later; unfortunately there do not appear to be any openings within the next 2 weeks.  Patient did endorse to be willing to come in person as this appears to be better for patient success.  Paranoid schizophrenia - Continue Haldol 5 mg a.m. and 10 mg nightly - Continue Cogentin 1 mg twice a day -Follow-up in approximately 3-4 weeks  Collaboration of Care: Collaboration of Care:   Patient/Guardian was  advised Release of Information must be obtained prior to any record release in order to collaborate their care with an outside provider. Patient/Guardian was advised if they have not already done so to contact the registration department to sign all necessary forms in order for 30 to release information regarding their care.   Consent: Patient/Guardian gives verbal consent for treatment and assignment of benefits for services provided during this visit. Patient/Guardian expressed understanding and agreed to proceed.   PGY-3 [place], MD 07/16/2022, 11:49 AM

## 2022-07-25 ENCOUNTER — Encounter (HOSPITAL_COMMUNITY): Payer: Medicare HMO | Admitting: Student in an Organized Health Care Education/Training Program

## 2022-09-02 ENCOUNTER — Encounter (HOSPITAL_COMMUNITY): Payer: Medicare HMO | Admitting: Student in an Organized Health Care Education/Training Program

## 2022-09-02 ENCOUNTER — Encounter (HOSPITAL_COMMUNITY): Payer: Self-pay

## 2023-03-10 ENCOUNTER — Ambulatory Visit (INDEPENDENT_AMBULATORY_CARE_PROVIDER_SITE_OTHER): Payer: Medicare HMO | Admitting: Psychiatry

## 2023-03-10 ENCOUNTER — Encounter (HOSPITAL_COMMUNITY): Payer: Self-pay | Admitting: Psychiatry

## 2023-03-10 DIAGNOSIS — F200 Paranoid schizophrenia: Secondary | ICD-10-CM

## 2023-03-10 MED ORDER — BENZTROPINE MESYLATE 1 MG PO TABS: 1.0000 mg | ORAL_TABLET | Freq: Two times a day (BID) | ORAL | 2 refills | Status: DC

## 2023-03-10 MED ORDER — HALOPERIDOL 10 MG PO TABS: 10.0000 mg | ORAL_TABLET | Freq: Every day | ORAL | 2 refills | Status: DC

## 2023-03-10 MED ORDER — HALOPERIDOL 5 MG PO TABS: 5.0000 mg | ORAL_TABLET | Freq: Every day | ORAL | 2 refills | Status: DC

## 2023-03-10 NOTE — Progress Notes (Signed)
BH MD/PA/NP OP Progress Note      03/10/2023 11:31 AM Akari Konkel  MRN:  [id number]  Chief Complaint: "I need my medications but they mess with my thoughts"  HPI: 37 year old male seen today for follow up psychiatric evaluation. He has a psychiatric history of paranoid schizophrenia, bipolar disorder, schizoaffective disorder, and cannabis use disorder. He is currently managed on Haldol 5 mg daily, Haldol 10 mg nightly, and cogentin 1 mg BID.  He notes that his medications are effective in managing his psychiatric conditions.    Today he is well-groomed, pleasant, cooperative, and somewhat engaged in conversation. Patient informed Probation officer that he needs his medications but notes that they mess with his thoughts. Patient somewhat delusional. He notes that his thoughts connect him to spiritual things known as the watches who live under the Earth. He reports at times the watchers take his thoughts away. He also reports that he feels connected to all creation. Patient denies VAH or mania. At times patient notes that he is paranoid.   Patient notes that his anxiety and depression are minimal.  Today provider conducted a GAD-7 and patient scored a 7. Patient notes that he at times he is anxious about his new male he wants to pursue. He reports that he want to be have a relationship with her but notes that she does not feel the same.  Provider also conducted a PHQ-9 and patient scored a 3. He endorses adequate sleep and appetite.  He denies SI/HI.   Patient reports that he has back pain daily.  He notes that he was in a research study and they gave him a TENS machine which she reports is somewhat effective in managing his pain.  Patient reports that he is doing well on his current medication regimen and notes that he is able to cope with what he calls the watches. No medication changes made today. Patient agreeable to continue medications as prescribed.   Visit Diagnosis:    ICD-10-CM   1.  Paranoid schizophrenia ([place])  F20.0 benztropine (COGENTIN) 1 MG tablet    haloperidol (HALDOL) 5 MG tablet    haloperidol (HALDOL) 10 MG tablet       Past Psychiatric History: paranoid schizophrenia, bipolar disorder, schizoaffective disorder, and cannabis use disorder.  Past Medical History:  Past Medical History:  Diagnosis Date   Back pain    Bipolar 1 disorder ([place])    Hyperlipidemia    Hypertension    pt has been prescribed HCTZ 12.5 mg daily. Pt was 140/80 on admission.    Schizophrenia ([place])    History reviewed. No pertinent surgical history.  Family Psychiatric History: Cousin ADHD, Twin Brother history of schizophrenia and schizoaffective bipolar type  Family History:  Family History  Problem Relation Age of Onset   Hypertension Father    Hyperlipidemia Father    Hypertension Paternal Grandmother    Hypertension Paternal Grandfather    Hypertension Brother        identical twin   Psychosis Brother        "mental issues"      Social History:  Social History   Socioeconomic History   Marital status: Single    Spouse name: Not on file   Number of children: Not on file   Years of education: Not on file   Highest education level: Not on file  Occupational History   Not on file  Tobacco Use   Smoking status: Every Day    Types: Cigarettes  Smokeless tobacco: Never   Tobacco comments:    10-15  Vaping Use   Vaping Use: Not on file  Substance and Sexual Activity   Alcohol use: Yes    Alcohol/week: 4.0 standard drinks of alcohol    Types: 4 Cans of beer per week    Comment: occasional alcohol use roughly 2x weekly, 1-2 drinks per event   Drug use: Yes    Types: Marijuana    Comment: Pt refused to comment on drug use   Sexual activity: Not Currently  Other Topics Concern   Not on file  Social History Narrative   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 Also works for the [employer] one day a week.    Single   No children   Completed college  (bachelors in Pharmacist, community)   Enjoys swimming, playing pool, walking   Grew up [place].  Has identical twin and 2 other brothers in [place]      06/2022: Homeless      07/03/22: Pt says he lives alone and works but doesn't specify his job. Pt denies social support. Has psychiatric services with Burt Ek, NP.   Social Determinants of Health   Financial Resource Strain: Not on file  Food Insecurity: Not on file  Transportation Needs: Not on file  Physical Activity: Not on file  Stress: Not on file  Social Connections: Not on file    Allergies:  Allergies  Allergen Reactions   Geodon [Ziprasidone Hcl] Other (See Comments)    Dry throat    Metabolic Disorder Labs: Lab Results  Component Value Date   HGBA1C 5.2 07/03/2022   MPG 102.54 07/03/2022   MPG 96.8 12/22/2018   Lab Results  Component Value Date   PROLACTIN 30.0 (H) 07/29/2018   Lab Results  Component Value Date   CHOL 232 (H) 07/03/2022   TRIG 265 (H) 07/03/2022   HDL 38 (L) 07/03/2022   CHOLHDL 6.1 07/03/2022   VLDL 53 (H) 07/03/2022   LDLCALC 141 (H) 07/03/2022   LDLCALC 91 04/17/2022   Lab Results  Component Value Date   TSH 1.299 07/03/2022   TSH 1.609 12/22/2018    Therapeutic Level Labs: No results found for: "LITHIUM" No results found for: "VALPROATE" Lab Results  Component Value Date   CBMZ <0.5 (L) 10/09/2012   CBMZ 2.2 (L) 09/21/2012    Current Medications: Current Outpatient Medications  Medication Sig Dispense Refill   amLODipine (NORVASC) 5 MG tablet Take 1 tablet (5 mg total) by mouth daily. 30 tablet 0   benztropine (COGENTIN) 1 MG tablet Take 1 tablet (1 mg total) by mouth 2 (two) times daily. 60 tablet 2   haloperidol (HALDOL) 10 MG tablet Take 1 tablet (10 mg total) by mouth at bedtime. 30 tablet 2   haloperidol (HALDOL) 5 MG tablet Take 1 tablet (5 mg total) by mouth daily. 30 tablet 2   No current facility-administered medications for this visit.      Musculoskeletal: Strength & Muscle Tone: within normal limits Gait & Station: normal Patient leans: N/A  Psychiatric Specialty Exam: Review of Systems  Blood pressure (!) 151/119, pulse 99, height 5\' 4"  (1.626 m), weight 182 lb (82.6 kg), SpO2 98 %.Body mass index is 31.24 kg/m.  General Appearance: Well Groomed  Eye Contact:  Good  Speech:  Blocked and Normal Rate  Volume:  Normal  Mood:  Euthymic  Affect:  Appropriate and Congruent  Thought Process:  Coherent, Goal  Directed and Linear  Orientation:  Full (Time, Place, and Person)  Thought Content: Delusions and Paranoid Ideation   Suicidal Thoughts:  No  Homicidal Thoughts:  No  Memory:  Immediate;   Good Recent;   Good Remote;   Good  Judgement:  Good  Insight:  Good  Psychomotor Activity:  Normal  Concentration:  Concentration: Good and Attention Span: Good  Recall:  Good  Fund of Knowledge: Good  Language: Good  Akathisia:  No  Handed:  Right  AIMS (if indicated):done, no abnormal findings  Assets:  Communication Skills Desire for Improvement Financial Resources/Insurance Housing Social Support  ADL's:  Intact  Cognition: WNL  Sleep:  Good   Screenings: AIMS    Flowsheet Row Admission (Discharged) from 07/03/2022 in [place] from 09/09/2021 in [facility] Admission (Discharged) from 12/21/2018 in [place] [facility] Admission (Discharged) from 07/27/2018 in [place] [facility]  AIMS Total Score 0 0 0 0      AUDIT    Flowsheet Row Admission (Discharged) from 12/21/2018 in [place] [facility] Admission (Discharged) from 07/27/2018 in [place] [facility]  Alcohol Use Disorder Identification Test Final Score (AUDIT) 0 0      GAD-7    Flowsheet Row Clinical Support from 09/09/2021 [facility]  Video Visit from 08/02/2021 in [facility] Video Visit from 05/02/2021 in [facility] Video Visit from 02/04/2021 in [facility]  Total GAD-7 Score 7 6 1 2       PHQ2-9    Flowsheet Row Clinical Support from 03/10/2023 in [facility] from 09/09/2021 in [facility] Video Visit from 08/02/2021 in [facility] Video Visit from 05/02/2021 in [facility] Video Visit from 02/04/2021 in [facility]  PHQ-2 Total Score 0 0 0 0 0  PHQ-9 Total Score 3 3 5  0 0      Flowsheet Row Admission (Discharged) from 07/03/2022 in [facility] [place] [facility] ED from 07/02/2022 in [facility] ED from 04/17/2022 in [facility] Emergency Department at [place] No Risk Error: Question 6 not populated No Risk        Assessment and Plan: Patient endorses paranoia and delusional thinking. Patient reports that he is doing well on his current medication regimen and notes that he is able to cope with what he calls the watches. No medication changes made today. Patient agreeable to continue medications as prescribed 1. Paranoid schizophrenia ([place])  Continue- benztropine (COGENTIN) 1 MG tablet; Take 1 tablet (1 mg total) by mouth 2 (two) times daily.  Dispense: 60 tablet; Refill: 2 Continue- haloperidol (HALDOL) 5 MG tablet; Take 1 tablet (5 mg total) by mouth daily.  Dispense: 30 tablet; Refill: 2 Continue- haloperidol (HALDOL) 10 MG tablet; Take 1 tablet (10 mg total) by mouth at bedtime.  Dispense: 30 tablet; Refill: 2    Follow up in 3 months   Salley Slaughter, NP 03/10/2023, 11:31 AM

## 2023-03-16 DIAGNOSIS — Z0000 Encounter for general adult medical examination without abnormal findings: Secondary | ICD-10-CM | POA: Diagnosis not present

## 2023-03-16 DIAGNOSIS — Z1159 Encounter for screening for other viral diseases: Secondary | ICD-10-CM | POA: Diagnosis not present

## 2023-03-16 DIAGNOSIS — R634 Abnormal weight loss: Secondary | ICD-10-CM | POA: Diagnosis not present

## 2023-03-16 DIAGNOSIS — R0602 Shortness of breath: Secondary | ICD-10-CM | POA: Diagnosis not present

## 2023-03-16 DIAGNOSIS — E039 Hypothyroidism, unspecified: Secondary | ICD-10-CM | POA: Diagnosis not present

## 2023-03-16 DIAGNOSIS — Z7251 High risk heterosexual behavior: Secondary | ICD-10-CM | POA: Diagnosis not present

## 2023-03-16 DIAGNOSIS — E559 Vitamin D deficiency, unspecified: Secondary | ICD-10-CM | POA: Diagnosis not present

## 2023-03-16 DIAGNOSIS — Z131 Encounter for screening for diabetes mellitus: Secondary | ICD-10-CM | POA: Diagnosis not present

## 2023-03-27 DIAGNOSIS — A749 Chlamydial infection, unspecified: Secondary | ICD-10-CM | POA: Diagnosis not present

## 2023-04-09 DIAGNOSIS — I10 Essential (primary) hypertension: Secondary | ICD-10-CM | POA: Diagnosis not present

## 2023-04-09 DIAGNOSIS — Z0000 Encounter for general adult medical examination without abnormal findings: Secondary | ICD-10-CM | POA: Diagnosis not present

## 2023-04-09 DIAGNOSIS — E7800 Pure hypercholesterolemia, unspecified: Secondary | ICD-10-CM | POA: Diagnosis not present

## 2023-04-09 DIAGNOSIS — E559 Vitamin D deficiency, unspecified: Secondary | ICD-10-CM | POA: Diagnosis not present

## 2023-04-09 DIAGNOSIS — Z6831 Body mass index (BMI) 31.0-31.9, adult: Secondary | ICD-10-CM | POA: Diagnosis not present

## 2023-04-09 DIAGNOSIS — Z7251 High risk heterosexual behavior: Secondary | ICD-10-CM | POA: Diagnosis not present

## 2023-04-09 DIAGNOSIS — A749 Chlamydial infection, unspecified: Secondary | ICD-10-CM | POA: Diagnosis not present

## 2023-04-09 DIAGNOSIS — E6609 Other obesity due to excess calories: Secondary | ICD-10-CM | POA: Diagnosis not present

## 2023-05-20 ENCOUNTER — Telehealth (INDEPENDENT_AMBULATORY_CARE_PROVIDER_SITE_OTHER): Payer: Medicare HMO | Admitting: Psychiatry

## 2023-05-20 ENCOUNTER — Encounter (HOSPITAL_COMMUNITY): Payer: Self-pay | Admitting: Psychiatry

## 2023-05-20 DIAGNOSIS — F200 Paranoid schizophrenia: Secondary | ICD-10-CM

## 2023-05-20 MED ORDER — BENZTROPINE MESYLATE 1 MG PO TABS: 1.0000 mg | ORAL_TABLET | Freq: Two times a day (BID) | ORAL | 2 refills | Status: DC

## 2023-05-20 MED ORDER — HALOPERIDOL 10 MG PO TABS: 10.0000 mg | ORAL_TABLET | Freq: Every day | ORAL | 2 refills | Status: DC

## 2023-05-20 MED ORDER — HALOPERIDOL 5 MG PO TABS: 5.0000 mg | ORAL_TABLET | Freq: Every day | ORAL | 2 refills | Status: DC

## 2023-05-20 NOTE — Progress Notes (Signed)
BH MD/PA/NP OP Progress Note      05/20/2023 1:41 PM Lucas Knapp  MRN:  [id number]  Chief Complaint: "I think the  medications are working"  HPI: 37 year old male seen today for follow up psychiatric evaluation. He has a psychiatric history of paranoid schizophrenia, bipolar disorder, schizoaffective disorder, and cannabis use disorder. He is currently managed on Haldol 5 mg daily, Haldol 10 mg nightly, and cogentin 1 mg BID.  He notes that his medications are effective in managing his psychiatric conditions.    Today he is well-groomed, pleasant, cooperative, and somewhat engaged in conversation. He informed Clinical research associate that he likes his medications. Patient does not present with delusions today. He informed Clinical research associate that he no longer believes in the watchers. He also denies VAH, mania, or paranoia. Patient informed Clinical research associate that he needs his medications but notes that they mess with his thoughts.   Patient notes that his anxiety and depression are minimal.  Today provider conducted a GAD-7 and patient scored a 2, at her last visit she scored a 7.  Provider also conducted a PHQ-9 and patient scored a 2, at his last visit he scored 3. He endorses adequate sleep and appetite.  He denies SI/HI/VAH, mania or paranoia.   Patient reports that he is doing well on his current medication regimen.  No medication changes made today. Patient agreeable to continue medications as prescribed.  Patient has not had labs in over 6 months. Provider ordered cbc, lft, lipid panel, prolactin level, cmp, and HGB A1c.  Visit Diagnosis:    ICD-10-CM   1. Paranoid schizophrenia ([facility])  F20.0 benztropine (COGENTIN) 1 MG tablet    haloperidol (HALDOL) 5 MG tablet    haloperidol (HALDOL) 10 MG tablet    Thyroid Panel With TSH    Lipid Profile    Hepatic function panel    CBC w/Diff/Platelet    Comprehensive Metabolic Panel (CMET)    Prolactin    HgB A1c       Past Psychiatric History: paranoid schizophrenia,  bipolar disorder, schizoaffective disorder, and cannabis use disorder.  Past Medical History:  Past Medical History:  Diagnosis Date   Back pain    Bipolar 1 disorder ([facility])    Hyperlipidemia    Hypertension    pt has been prescribed HCTZ 12.5 mg daily. Pt was 140/80 on admission.    Schizophrenia ([facility])    No past surgical history on file.  Family Psychiatric History: Cousin ADHD, Twin Brother history of schizophrenia and schizoaffective bipolar type  Family History:  Family History  Problem Relation Age of Onset   Hypertension Father    Hyperlipidemia Father    Hypertension Paternal Grandmother    Hypertension Paternal Grandfather    Hypertension Brother        identical twin   Psychosis Brother        "mental issues"      Social History:  Social History   Socioeconomic History   Marital status: Single    Spouse name: Not on file   Number of children: Not on file   Years of education: Not on file   Highest education level: Not on file  Occupational History   Not on file  Tobacco Use   Smoking status: Every Day    Types: Cigarettes   Smokeless tobacco: Never   Tobacco comments:    10-15  Vaping Use   Vaping Use: Not on file  Substance and Sexual Activity   Alcohol use: Yes  Alcohol/week: 4.0 standard drinks of alcohol    Types: 4 Cans of beer per week    Comment: occasional alcohol use roughly 2x weekly, 1-2 drinks per event   Drug use: Yes    Types: Marijuana    Comment: Pt refused to comment on drug use   Sexual activity: Not Currently  Other Topics Concern   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    Single   No children   Completed college (bachelors in Statistician)   Enjoys swimming, playing pool, walking   Grew up [place].  Has identical twin and 2 other brothers in [place]      06/2022: Homeless      07/03/22: Pt says he lives alone and  works but doesn't specify his job. Pt denies social support. Has psychiatric services with Gretchen Short, NP.   Social Determinants of Health   Financial Resource Strain: Not on file  Food Insecurity: Not on file  Transportation Needs: Not on file  Physical Activity: Not on file  Stress: Not on file  Social Connections: Not on file    Allergies:  Allergies  Allergen Reactions   Geodon [Ziprasidone Hcl] Other (See Comments)    Dry throat    Metabolic Disorder Labs: Lab Results  Component Value Date   HGBA1C 5.2 07/03/2022   MPG 102.54 07/03/2022   MPG 96.8 12/22/2018   Lab Results  Component Value Date   PROLACTIN 30.0 (H) 07/29/2018   Lab Results  Component Value Date   CHOL 232 (H) 07/03/2022   TRIG 265 (H) 07/03/2022   HDL 38 (L) 07/03/2022   CHOLHDL 6.1 07/03/2022   VLDL 53 (H) 07/03/2022   LDLCALC 141 (H) 07/03/2022   LDLCALC 91 04/17/2022   Lab Results  Component Value Date   TSH 1.299 07/03/2022   TSH 1.609 12/22/2018    Therapeutic Level Labs: No results found for: "LITHIUM" No results found for: "VALPROATE" Lab Results  Component Value Date   CBMZ <0.5 (L) 10/09/2012   CBMZ 2.2 (L) 09/21/2012    Current Medications: Current Outpatient Medications  Medication Sig Dispense Refill   amLODipine (NORVASC) 5 MG tablet Take 1 tablet (5 mg total) by mouth daily. 30 tablet 0   benztropine (COGENTIN) 1 MG tablet Take 1 tablet (1 mg total) by mouth 2 (two) times daily. 60 tablet 2   haloperidol (HALDOL) 10 MG tablet Take 1 tablet (10 mg total) by mouth at bedtime. 30 tablet 2   haloperidol (HALDOL) 5 MG tablet Take 1 tablet (5 mg total) by mouth daily. 30 tablet 2   No current facility-administered medications for this visit.     Musculoskeletal: Strength & Muscle Tone: within normal limits, telehealth visit Gait & Station: normal, telehealth visit Patient leans: N/A  Psychiatric Specialty Exam: Review of Systems  There were no vitals taken  for this visit.There is no height or weight on file to calculate BMI.  General Appearance: Well Groomed  Eye Contact:  Good  Speech:  Blocked and Normal Rate  Volume:  Normal  Mood:  Euthymic  Affect:  Appropriate and Congruent  Thought Process:  Coherent, Goal Directed and Linear  Orientation:  Full (Time, Place, and Person)  Thought Content: WDL and Logical   Suicidal Thoughts:  No  Homicidal Thoughts:  No  Memory:  Immediate;   Good Recent;  Good Remote;   Good  Judgement:  Good  Insight:  Good  Psychomotor Activity:  Normal  Concentration:  Concentration: Good and Attention Span: Good  Recall:  Good  Fund of Knowledge: Good  Language: Good  Akathisia:  No  Handed:  Right  AIMS (if indicated):done, no abnormal findings  Assets:  Communication Skills Desire for Improvement Financial Resources/Insurance Housing Social Support  ADL's:  Intact  Cognition: WNL  Sleep:  Good   Screenings: AIMS    Flowsheet Row Clinical Support from 03/10/2023 in [facility] Clinical Support from 09/09/2021 in [facility] Ad[facility] (Discharged[facility] ([facility]) [facility]  AIMS Total Score 0 0 0 0 0      AUDIT    Flowsheet Row [facility]) [facility] Ad[facility] (Dis[facility]  Alcohol Use Disorder Identification Test Final Score (AUDIT) 0 0      GAD-7    Flowsheet Row Video Visit from 05/20/2023 in [facility] Clinical Support from 09/09/2021 in [facility] Video Visit from 08/02/2021 in [facility] Video Visit from  05/02/2021 in [facility] Video Visit from 02/04/2021 in [facility]  Total GAD-7 Score 2 7 6 1 2       PHQ2-9    Flowsheet Row Video Visit from 05/20/2023 in [facility] Clinical Support from 03/10/2023 in [facility] Clinical Support from 09/09/2021 in [facility] Video Visit from 08/02/2021 in [facility] Video Visit from 05/02/2021 in [facility]  PHQ-2 Total Score 1 0 0 0 0  PHQ-9 Total Score 2 3 3 5  0      Flowsheet Row Ad[facility]) [facility] from 07/02/2022 in [facility] ED from 04/17/2022 in [facility] Emergency Department at [facility]  C-SSRS RISK CATEGORY No Risk Error: Question 6 not populated No Risk        Assessment and Plan: Patient notes that he is doing well on his currently medication regimen. No medication changes made today. Patient agreeable to continue medications as prescribed. Patient has not had labs in over 6 months. Provider ordered cbc, lft, lipid panel, prolactin level, cmp, and HGB A1c.   1. Paranoid schizophrenia ([facility])  Continue- benztropine (COGENTIN) 1 MG tablet; Take 1 tablet (1 mg total) by mouth 2 (two) times daily.  Dispense: 60 tablet; Refill: 2 Continue- haloperidol (HALDOL) 5 MG tablet; Take 1 tablet (5 mg total) by mouth daily.  Dispense: 30 tablet; Refill: 2 Continue- haloperidol (HALDOL) 10 MG tablet; Take 1 tablet (10 mg total) by mouth at bedtime.  Dispense: 30 tablet; Refill: 2    Follow up in 3 months   Shanna Cisco, NP 05/20/2023, 1:41 PM

## 2023-06-19 DIAGNOSIS — E7800 Pure hypercholesterolemia, unspecified: Secondary | ICD-10-CM | POA: Diagnosis not present

## 2023-06-19 DIAGNOSIS — M6740 Ganglion, unspecified site: Secondary | ICD-10-CM | POA: Diagnosis not present

## 2023-06-19 DIAGNOSIS — E6609 Other obesity due to excess calories: Secondary | ICD-10-CM | POA: Diagnosis not present

## 2023-06-19 DIAGNOSIS — Z79899 Other long term (current) drug therapy: Secondary | ICD-10-CM | POA: Diagnosis not present

## 2023-06-19 DIAGNOSIS — L299 Pruritus, unspecified: Secondary | ICD-10-CM | POA: Diagnosis not present

## 2023-06-19 DIAGNOSIS — E559 Vitamin D deficiency, unspecified: Secondary | ICD-10-CM | POA: Diagnosis not present

## 2023-06-19 DIAGNOSIS — Z6832 Body mass index (BMI) 32.0-32.9, adult: Secondary | ICD-10-CM | POA: Diagnosis not present

## 2023-06-19 DIAGNOSIS — Z131 Encounter for screening for diabetes mellitus: Secondary | ICD-10-CM | POA: Diagnosis not present

## 2023-06-19 DIAGNOSIS — R5383 Other fatigue: Secondary | ICD-10-CM | POA: Diagnosis not present

## 2023-08-10 ENCOUNTER — Other Ambulatory Visit (HOSPITAL_COMMUNITY): Payer: Medicare HMO

## 2023-08-12 ENCOUNTER — Telehealth (HOSPITAL_COMMUNITY): Payer: Medicare HMO | Admitting: Psychiatry

## 2023-08-13 ENCOUNTER — Telehealth (HOSPITAL_COMMUNITY): Payer: Medicare HMO | Admitting: Psychiatry

## 2023-08-17 ENCOUNTER — Other Ambulatory Visit (HOSPITAL_COMMUNITY): Payer: Medicare HMO

## 2023-08-18 ENCOUNTER — Other Ambulatory Visit: Payer: Self-pay | Admitting: Psychiatry

## 2023-08-18 DIAGNOSIS — F200 Paranoid schizophrenia: Secondary | ICD-10-CM | POA: Diagnosis not present

## 2023-08-19 LAB — SPECIMEN STATUS REPORT

## 2023-08-19 LAB — CBC WITH DIFFERENTIAL/PLATELET
Basophils Absolute: 0.1 10*3/uL (ref 0.0–0.2)
Basos: 1 %
EOS (ABSOLUTE): 0 10*3/uL (ref 0.0–0.4)
Eos: 0 %
Hematocrit: 50.8 % (ref 37.5–51.0)
Hemoglobin: 17.1 g/dL (ref 13.0–17.7)
Immature Grans (Abs): 0 10*3/uL (ref 0.0–0.1)
Immature Granulocytes: 0 %
Lymphocytes Absolute: 2.2 10*3/uL (ref 0.7–3.1)
Lymphs: 32 %
MCH: 32 pg (ref 26.6–33.0)
MCHC: 33.7 g/dL (ref 31.5–35.7)
MCV: 95 fL (ref 79–97)
Monocytes Absolute: 0.3 10*3/uL (ref 0.1–0.9)
Monocytes: 5 %
Neutrophils Absolute: 4.2 10*3/uL (ref 1.4–7.0)
Neutrophils: 62 %
Platelets: 386 10*3/uL (ref 150–450)
RBC: 5.35 x10E6/uL (ref 4.14–5.80)
RDW: 11.9 % (ref 11.6–15.4)
WBC: 6.8 10*3/uL (ref 3.4–10.8)

## 2023-08-19 LAB — LIPID PANEL
Chol/HDL Ratio: 4.2 ratio (ref 0.0–5.0)
Cholesterol, Total: 211 mg/dL — ABNORMAL HIGH (ref 100–199)
HDL: 50 mg/dL (ref >39)
LDL Chol Calc (NIH): 147 mg/dL — ABNORMAL HIGH (ref 0–99)
Triglycerides: 79 mg/dL (ref 0–149)
VLDL Cholesterol Cal: 14 mg/dL (ref 5–40)

## 2023-08-19 LAB — COMPREHENSIVE METABOLIC PANEL
ALT: 15 IU/L (ref 0–44)
AST: 23 IU/L (ref 0–40)
Albumin: 4.4 g/dL (ref 4.1–5.1)
Alkaline Phosphatase: 95 IU/L (ref 44–121)
BUN/Creatinine Ratio: 9 (ref 9–20)
BUN: 8 mg/dL (ref 6–20)
Bilirubin Total: 0.3 mg/dL (ref 0.0–1.2)
CO2: 21 mmol/L (ref 20–29)
Calcium: 9.9 mg/dL (ref 8.7–10.2)
Chloride: 102 mmol/L (ref 96–106)
Creatinine, Ser: 0.94 mg/dL (ref 0.76–1.27)
Globulin, Total: 2.6 g/dL (ref 1.5–4.5)
Glucose: 96 mg/dL (ref 70–99)
Potassium: 4.3 mmol/L (ref 3.5–5.2)
Sodium: 140 mmol/L (ref 134–144)
Total Protein: 7 g/dL (ref 6.0–8.5)
eGFR: 106 mL/min/{1.73_m2} (ref >59)

## 2023-08-19 LAB — PROLACTIN: Prolactin: 4.7 ng/mL (ref 3.9–22.7)

## 2023-08-19 LAB — HEPATIC FUNCTION PANEL: Bilirubin, Direct: <0.1 mg/dL (ref 0.00–0.40)

## 2023-08-19 LAB — THYROID PANEL WITH TSH
Free Thyroxine Index: 1.9 (ref 1.2–4.9)
T3 Uptake Ratio: 25 % (ref 24–39)
T4, Total: 7.7 ug/dL (ref 4.5–12.0)
TSH: 2.2 u[IU]/mL (ref 0.450–4.500)

## 2023-08-19 LAB — HEMOGLOBIN A1C
Est. average glucose Bld gHb Est-mCnc: 108 mg/dL
Hgb A1c MFr Bld: 5.4 % (ref 4.8–5.6)

## 2023-08-20 NOTE — Progress Notes (Signed)
Provider called to discuss his recently labs. Patient cholesterol is slightly elevated. Provider recommend diet and exercise. If it does not improve he can follow up with his PCP for further intervention.

## 2023-08-25 ENCOUNTER — Encounter (HOSPITAL_COMMUNITY): Payer: Self-pay | Admitting: Psychiatry

## 2023-08-25 ENCOUNTER — Encounter (HOSPITAL_COMMUNITY): Payer: Self-pay

## 2023-08-25 ENCOUNTER — Telehealth (HOSPITAL_COMMUNITY): Payer: Medicare HMO | Admitting: Psychiatry

## 2023-08-25 ENCOUNTER — Telehealth (INDEPENDENT_AMBULATORY_CARE_PROVIDER_SITE_OTHER): Payer: Medicare HMO | Admitting: Psychiatry

## 2023-08-25 DIAGNOSIS — F200 Paranoid schizophrenia: Secondary | ICD-10-CM

## 2023-08-25 MED ORDER — BENZTROPINE MESYLATE 1 MG PO TABS: 1.0000 mg | ORAL_TABLET | Freq: Two times a day (BID) | ORAL | 2 refills | Status: DC

## 2023-08-25 MED ORDER — HALOPERIDOL 5 MG PO TABS
5.0000 mg | ORAL_TABLET | Freq: Two times a day (BID) | ORAL | 3 refills | Status: DC
Start: 2023-08-25 — End: 2023-11-05

## 2023-08-25 NOTE — Progress Notes (Signed)
BH MD/PA/NP OP Progress Note Virtual Visit via Video Note  I connected with Lucas Knapp on 08/25/23 at  2:30 PM EDT by a video enabled telemedicine application and verified that I am speaking with the correct person using two identifiers.  Location: Patient: Work Provider: Clinic   I discussed the limitations of evaluation and management by telemedicine and the availability of in person appointments. The patient expressed understanding and agreed to proceed.  I provided 30 minutes of non-face-to-face time during this encounter.       08/25/2023 3:00 PM Lucas Knapp  MRN:  [id number]  Chief Complaint: "Haldol makes me tired and I have only been taking half the dose"  HPI: 37 year old male seen today for follow up psychiatric evaluation. He has a psychiatric history of paranoid schizophrenia, bipolar disorder, schizoaffective disorder, and cannabis use disorder. He is currently managed on Haldol 5 mg daily, Haldol 10 mg nightly, and cogentin 1 mg BID.  He notes that his medications are somewhat effective in managing his psychiatric conditions.    Today he is well-groomed, pleasant, cooperative, and somewhat engaged in conversation. He informed Clinical research associate that he has only been taking half of his 10 mg tablet of Haldol at night.  He notes that it makes him feel tired.  Patient informed Clinical research associate that he has been working in [employer] and wants to be successful in his job.  He reduced his Haldol so that he would be less tired and fatigued at work.  Since his last visit he informed writer that his anxiety and depression continues to be well-managed.  Provider conducted a GAD-7 and he scored a 3, at his last visit he scored a 2.  Provider also conducted PHQ-9 and he scored a 0, at his last visit he scored 2.  He endorses adequate sleep and appetite.  Today he denies SI/HI/VH, mania, paranoia.    Patient informed writer that at times he feels that his tongue is swollen after taking Cogentin.  He  however finds Cogentin effective and does not wish to discontinue it.  Provider recommended patient come into the clinic at his next appointment.  He endorsed understanding and agreed.  Today Haldol reduced from 10 mg nightly to 5 mg twice daily.  He will continue all other medications as prescribed.  No other concerns noted at this time.    Visit Diagnosis:    ICD-10-CM   1. Paranoid schizophrenia (HCC)  F20.0 benztropine (COGENTIN) 1 MG tablet    haloperidol (HALDOL) 5 MG tablet        Past Psychiatric History: paranoid schizophrenia, bipolar disorder, schizoaffective disorder, and cannabis use disorder.  Past Medical History:  Past Medical History:  Diagnosis Date   Back pain    Bipolar 1 disorder (HCC)    Hyperlipidemia    Hypertension    pt has been prescribed HCTZ 12.5 mg daily. Pt was 140/80 on admission.    Schizophrenia (HCC)    History reviewed. No pertinent surgical history.  Family Psychiatric History: Cousin ADHD, Twin Brother history of schizophrenia and schizoaffective bipolar type  Family History:  Family History  Problem Relation Age of Onset   Hypertension Father    Hyperlipidemia Father    Hypertension Paternal Grandmother    Hypertension Paternal Grandfather    Hypertension Brother        identical twin   Psychosis Brother        "mental issues"      Social History:  Social History  Socioeconomic History   Marital status: Single    Spouse name: Not on file   Number of children: Not on file   Years of education: Not on file   Highest education level: Not on file  Occupational History   Not on file  Tobacco Use   Smoking status: Every Day    Types: Cigarettes   Smokeless tobacco: Never   Tobacco comments:    10-15  Vaping Use   Vaping status: Not on file  Substance and Sexual Activity   Alcohol use: Yes    Alcohol/week: 4.0 standard drinks of alcohol    Types: 4 Cans of beer per week    Comment: occasional alcohol use roughly 2x weekly,  1-2 drinks per event   Drug use: Yes    Types: Marijuana    Comment: Pt refused to comment on drug use   Sexual activity: Not Currently  Other Topics Concern   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    Single   No children   Completed college (bachelors in Statistician)   Enjoys swimming, playing pool, walking   Grew up [place].  Has identical twin and 2 other brothers in [place]      06/2022: Homeless      07/03/22: Pt says he lives alone and works but doesn't specify his job. Pt denies social support. Has psychiatric services with Gretchen Short, NP.   Social Determinants of Health   Financial Resource Strain: Not on file  Food Insecurity: Not on file  Transportation Needs: Not on file  Physical Activity: Not on file  Stress: Not on file  Social Connections: Not on file    Allergies:  Allergies  Allergen Reactions   Geodon [Ziprasidone Hcl] Other (See Comments)    Dry throat    Metabolic Disorder Labs: Lab Results  Component Value Date   HGBA1C 5.4 08/18/2023   MPG 102.54 07/03/2022   MPG 96.8 12/22/2018   Lab Results  Component Value Date   PROLACTIN 4.7 08/18/2023   PROLACTIN 30.0 (H) 07/29/2018   Lab Results  Component Value Date   CHOL 211 (H) 08/18/2023   TRIG 79 08/18/2023   HDL 50 08/18/2023   CHOLHDL 4.2 08/18/2023   VLDL 53 (H) 07/03/2022   LDLCALC 147 (H) 08/18/2023   LDLCALC 141 (H) 07/03/2022   Lab Results  Component Value Date   TSH 2.200 08/18/2023   TSH 1.299 07/03/2022    Therapeutic Level Labs: No results found for: "LITHIUM" No results found for: "VALPROATE" Lab Results  Component Value Date   CBMZ <0.5 (L) 10/09/2012   CBMZ 2.2 (L) 09/21/2012    Current Medications: Current Outpatient Medications  Medication Sig Dispense Refill   amLODipine (NORVASC) 5 MG tablet Take 1 tablet (5 mg total) by mouth daily. 30 tablet  0   benztropine (COGENTIN) 1 MG tablet Take 1 tablet (1 mg total) by mouth 2 (two) times daily. 60 tablet 2   haloperidol (HALDOL) 5 MG tablet Take 1 tablet (5 mg total) by mouth 2 (two) times daily. 60 tablet 3   No current facility-administered medications for this visit.     Musculoskeletal: Strength & Muscle Tone: within normal limits, telehealth visit Gait & Station: normal, telehealth visit Patient leans: N/A  Psychiatric Specialty Exam: Review of Systems  There were no vitals taken for this  visit.There is no height or weight on file to calculate BMI.  General Appearance: Well Groomed  Eye Contact:  Good  Speech:  Blocked and Normal Rate  Volume:  Normal  Mood:  Euthymic  Affect:  Appropriate and Congruent  Thought Process:  Coherent, Goal Directed and Linear  Orientation:  Full (Time, Place, and Person)  Thought Content: WDL and Logical   Suicidal Thoughts:  No  Homicidal Thoughts:  No  Memory:  Immediate;   Good Recent;   Good Remote;   Good  Judgement:  Good  Insight:  Good  Psychomotor Activity:  Normal  Concentration:  Concentration: Good and Attention Span: Good  Recall:  Good  Fund of Knowledge: Good  Language: Good  Akathisia:  No  Handed:  Right  AIMS (if indicated):not done  Assets:  Communication Skills Desire for Improvement Financial Resources/Insurance Housing Social Support  ADL's:  Intact  Cognition: WNL  Sleep:  Good   Screenings: AIMS    Flowsheet Row Clinical Support from 03/10/2023 in [facility]) [facility] Clinical Support from 09/09/2021 in [facility] Admission (Discharged) from [facility]) [facility]  AIMS Total Score 0 0 0 0 0      AUDIT    Flowsheet Row Admission (Discharged)  [facility] (Dis[facility]) [facility]  Alcohol Use Disorder Identification Test Final Score (AUDIT) 0 0      GAD-7    Flowsheet Row Video Visit from 08/25/2023 in [facility] Video Visit from 05/20/2023 in [facility] Clinical Support from 09/09/2021 in [facility] Video Visit from 08/02/2021 in [facility] Video Visit from 05/02/2021 in [facility]  Total GAD-7 Score 3 2 7 6 1       PHQ2-9    Flowsheet Row Video Visit from 08/25/2023 in [facility] Video Visit from 05/20/2023 in [facility] Clinical Support from 03/10/2023 in [facility] Clinical Support from 09/09/2021 in [facility] Video Visit from 08/02/2021 in [facility]  PHQ-2 Total Score 0 1 0 0 0  PHQ-9 Total Score 0 2 3 3 5       Flowsheet Row Admission (Discharged) [facility] from 07/02/2022 in [facility] ED from 04/17/2022 in [facility] Emergency Department at [facility]  C-SSRS RISK CATEGORY No Risk Error: Question 6 not populated No Risk        Assessment and Plan: Patient notes that Haldol makes him tired and reports that he has reduced his nighttime dose.  He also informed Clinical research associate that Cogentin makes his tongue feels swollen.He however finds Cogentin effective and does not wish to discontinue it.  Provider recommended patient come into the clinic at his next appointment.  He endorsed understanding and agreed.  Today Haldol reduced from 10 mg nightly to 5 mg twice daily.  He will continue all other medications as prescribed.  No other concerns noted  at this time.    1. Paranoid schizophrenia (HCC)  Continue- benztropine (COGENTIN) 1 MG tablet; Take 1 tablet (1 mg total) by mouth 2 (  two) times daily.  Dispense: 60 tablet; Refill: 2 Reduced- haloperidol (HALDOL) 5 MG tablet; Take 1 tablet (5 mg total) by mouth 2 (two) times daily.  Dispense: 60 tablet; Refill: 3  Follow up in 2 months   Shanna Cisco, NP 08/25/2023, 3:00 PM

## 2023-11-05 ENCOUNTER — Ambulatory Visit (INDEPENDENT_AMBULATORY_CARE_PROVIDER_SITE_OTHER): Payer: Medicare HMO | Admitting: Psychiatry

## 2023-11-05 ENCOUNTER — Encounter (HOSPITAL_COMMUNITY): Payer: Self-pay | Admitting: Psychiatry

## 2023-11-05 VITALS — BP 139/97 | HR 77 | Temp 98.2°F | Ht 63.0 in | Wt 182.2 lb

## 2023-11-05 DIAGNOSIS — F200 Paranoid schizophrenia: Secondary | ICD-10-CM | POA: Diagnosis not present

## 2023-11-05 MED ORDER — QUETIAPINE FUMARATE 50 MG PO TABS: 50.0000 mg | ORAL_TABLET | Freq: Every day | ORAL | 3 refills | Status: DC

## 2023-11-05 MED ORDER — BENZTROPINE MESYLATE 1 MG PO TABS: 1.0000 mg | ORAL_TABLET | Freq: Two times a day (BID) | ORAL | 3 refills | Status: DC

## 2023-11-05 NOTE — Progress Notes (Signed)
BH MD/PA/NP OP Progress Note        11/05/2023 12:09 PM Lucas Knapp  MRN:  [id number]  Chief Complaint: "My tongue is swelling with haldol"  HPI: 37 year old male seen today for follow up psychiatric evaluation. He has a psychiatric history of paranoid schizophrenia, bipolar disorder, schizoaffective disorder, and cannabis use disorder. He is currently managed on Haldol 5 mg  twice daily,  and cogentin 1 mg BID.  He notes that his medications are somewhat effective in managing his psychiatric conditions.    Today he is well-groomed, pleasant, cooperative, and somewhat engaged in conversation.  Despite reducing Haldol to 5 mg twice daily he informed writer that his tongue continues to swell.  Patient reports that Cogentin makes it somewhat better.  Patient jaw slightly twisted today.  His tongue does appear thickened and swollen.  Provider recommended discontinuing Haldol.  Patient informed writer that in the past he trialed Abilify and Risperdal without success.  He did note that Seroquel was effective.  Provider conducted an aims assessment and patient scored a 4.  Since his last visit he informed writer that his anxiety depression continues to be well-managed.  Provider conducted a GAD-7 and patient scored a 2, at his last visit he scored a 3.  Provider also conducted PHQ-9 and patient scored a 3, at his last visit he scored a 0.  He endorses adequate sleep and appetite.  Today he denies SI/HI/AVH, mania, or paranoia.    Patient informed Clinical research associate that he is trying to become closer to God.  He notes that he is praying more and desires to have an internal baptism.  Patient does not seem to be hyperreligious at this time.   Today Haldol 5 mg twice daily discontinued.  Patient will start Seroquel 50 mg nightly.  Cogentin increased from to 1 mg twice daily to 2 mg twice daily Potential side effects of medication and risks vs benefits of treatment vs non-treatment were explained and discussed.  All questions were answered. Patient allowed Clinical research associate to speak with his mother Rhunette Croft- [phone]) about medication changes.  Provider informed patient's mother to reach out to her if side effects occur or if his mental health declines.  She endorsed understanding and agreed.  Patient did sign a release of information form.  Visit Diagnosis:    ICD-10-CM   1. Schizophrenia, paranoid type (HCC)  F20.0 benztropine (COGENTIN) 1 MG tablet    QUEtiapine (SEROQUEL) 50 MG tablet         Past Psychiatric History: paranoid schizophrenia, bipolar disorder, schizoaffective disorder, and cannabis use disorder.  Past Medical History:  Past Medical History:  Diagnosis Date   Back pain    Bipolar 1 disorder (HCC)    Hyperlipidemia    Hypertension    pt has been prescribed HCTZ 12.5 mg daily. Pt was 140/80 on admission.    Schizophrenia (HCC)    No past surgical history on file.  Family Psychiatric History: Cousin ADHD, Twin Brother history of schizophrenia and schizoaffective bipolar type  Family History:  Family History  Problem Relation Age of Onset   Hypertension Father    Hyperlipidemia Father    Hypertension Paternal Grandmother    Hypertension Paternal Grandfather    Hypertension Brother        identical twin   Psychosis Brother        "mental issues"      Social History:  Social History   Socioeconomic History   Marital status: Single  Spouse name: Not on file   Number of children: Not on file   Years of education: Not on file   Highest education level: Not on file  Occupational History   Not on file  Tobacco Use   Smoking status: Every Day    Types: Cigarettes   Smokeless tobacco: Never   Tobacco comments:    10-15  Vaping Use   Vaping status: Not on file  Substance and Sexual Activity   Alcohol use: Yes    Alcohol/week: 4.0 standard drinks of alcohol    Types: 4 Cans of beer per week    Comment: occasional alcohol use roughly 2x weekly, 1-2 drinks per  event   Drug use: Yes    Types: Marijuana    Comment: Pt refused to comment on drug use   Sexual activity: Not Currently  Other Topics Concern   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    Single   No children   Completed college (bachelors in Statistician)   Enjoys swimming, playing pool, walking   Grew up [place].  Has identical twin and 2 other brothers in [place]      06/2022: Homeless      07/03/22: Pt says he lives alone and works but doesn't specify his job. Pt denies social support. Has psychiatric services with Gretchen Short, NP.   Social Determinants of Health   Financial Resource Strain: Not on file  Food Insecurity: Not on file  Transportation Needs: Not on file  Physical Activity: Not on file  Stress: Not on file  Social Connections: Not on file    Allergies:  Allergies  Allergen Reactions   Geodon [Ziprasidone Hcl] Other (See Comments)    Dry throat    Metabolic Disorder Labs: Lab Results  Component Value Date   HGBA1C 5.4 08/18/2023   MPG 102.54 07/03/2022   MPG 96.8 12/22/2018   Lab Results  Component Value Date   PROLACTIN 4.7 08/18/2023   PROLACTIN 30.0 (H) 07/29/2018   Lab Results  Component Value Date   CHOL 211 (H) 08/18/2023   TRIG 79 08/18/2023   HDL 50 08/18/2023   CHOLHDL 4.2 08/18/2023   VLDL 53 (H) 07/03/2022   LDLCALC 147 (H) 08/18/2023   LDLCALC 141 (H) 07/03/2022   Lab Results  Component Value Date   TSH 2.200 08/18/2023   TSH 1.299 07/03/2022    Therapeutic Level Labs: No results found for: "LITHIUM" No results found for: "VALPROATE" Lab Results  Component Value Date   CBMZ <0.5 (L) 10/09/2012   CBMZ 2.2 (L) 09/21/2012    Current Medications: Current Outpatient Medications  Medication Sig Dispense Refill   QUEtiapine (SEROQUEL) 50 MG tablet Take 1 tablet (50 mg total) by mouth at bedtime. 30 tablet 3    amLODipine (NORVASC) 5 MG tablet Take 1 tablet (5 mg total) by mouth daily. 30 tablet 0   benztropine (COGENTIN) 1 MG tablet Take 1 tablet (1 mg total) by mouth 2 (two) times daily. 60 tablet 3   No current facility-administered medications for this visit.     Musculoskeletal: Strength & Muscle Tone: within normal limits Gait & Station: normal,  Patient leans: N/A  Psychiatric Specialty Exam: Review of Systems  There were no vitals taken for this visit.There is no height or weight on file to calculate BMI.  General Appearance: Well  Groomed  Eye Contact:  Good  Speech:  Blocked and Normal Rate  Volume:  Normal  Mood:  Euthymic  Affect:  Appropriate and Congruent  Thought Process:  Coherent, Goal Directed and Linear  Orientation:  Full (Time, Place, and Person)  Thought Content: WDL and Logical   Suicidal Thoughts:  No  Homicidal Thoughts:  No  Memory:  Immediate;   Good Recent;   Good Remote;   Good  Judgement:  Good  Insight:  Good  Psychomotor Activity:  Normal  Concentration:  Concentration: Good and Attention Span: Good  Recall:  Good  Fund of Knowledge: Good  Language: Good  Akathisia:  No  Handed:  Right  AIMS (if indicated): done, 4  Assets:  Communication Skills Desire for Improvement Financial Resources/Insurance Housing Social Support  ADL's:  Intact  Cognition: WNL  Sleep:  Good   Screenings: AIMS    Flowsheet Row Clinical Support from 11/05/2023 in [facility] Clinical Support from 03/10/2023 in [facility] Admission (Discharged[facility] Clinical Support from 09[facility]09/2021 in [facility] Admission (Discharged) from [facility]  AIMS Total Score 4 0 0 0 0      AUDIT    Flowsheet Row Admission (Discharged) [facility] Ad[facility] (Discharged) [facility]  Alcohol Use Disorder Identification Test Final Score (AUDIT) 0 0      GAD-7    Flowsheet Row Clinical Support from 11/05/2023 in [facility] Video Visit from 08/25/2023 in [facility] Video Visit from 05/20/2023 in [facility] Clinical Support from 09/09/2021 in [facility] Video Visit from 08/02/2021 in [facility]  Total GAD-7 Score 2 3 2 7 6       PHQ2-9    Flowsheet Row Video Visit from 08/25/2023 in [facility] Video Visit from 05/20/2023 in [facility] Clinical Support from 03/10/2023 in [facility] Clinical Support from 09/09/2021 in [facility] Video Visit from 08/02/2021 in [facility]  PHQ-2 Total Score 0 1 0 0 0  PHQ-9 Total Score 0 2 3 3 5       Flowsheet Row Admission (Discharged) [facility] from 07/02/2022 in [facility] ED from 04/17/2022 in [facility] Emergency Department at [facility]  C-SSRS RISK CATEGORY No Risk Error: Question 6 not populated No Risk        Assessment and Plan: Patient notes that Haldol makes his tongue swell despite it being reduced.  At times he notes that it makes it difficult for him to talk. Today Haldol 5 mg twice daily discontinued.  Patient will start Seroquel 50 mg nightly.  Cogentin increased from to 1 mg twice daily to 2 mg twice daily.  Patient allowed Clinical research associate to speak with his mother Rhunette Croft- [phone]) about medication changes.  Provider informed patient's mother to reach out to her if side effects occur or if his mental health declines.  She endorsed understanding and agreed.   Patient did sign a release of information form.  1. Schizophrenia, paranoid type (HCC)  Increased- benztropine (COGENTIN) 1 MG tablet; Take 1 tablet (1 mg total) by mouth  2 (two) times daily.  Dispense: 60 tablet; Refill: 3 Start- QUEtiapine (SEROQUEL) 50 MG tablet; Take 1 tablet (50 mg total) by mouth at bedtime.  Dispense: 30 tablet; Refill: 3   Follow up in 1 months   Shanna Cisco, NP 11/05/2023, 12:09 PM

## 2023-12-01 ENCOUNTER — Telehealth (HOSPITAL_COMMUNITY): Payer: Self-pay | Admitting: Psychiatry

## 2023-12-01 NOTE — Telephone Encounter (Signed)
Patient's mother notes that he is irritable, distracted, and paranoid.  She notes that he yells at the television and is currently going through a manic state.  She reports that Seroquel is ineffective at this time.  Patient gave Clinical research associate permission to speak with his mother at his last visit as his Haldol was discontinued due to having swollen tongue and abnormal muscle movements.  He was placed on Seroquel 50 mg and he requested that provider reach out to his mother and his mother reach out to providers if symptoms exacerbate.  Provider recommended increasing Seroquel however patient and mother was not agreeable.  Patient's mother notes that they are currently 200 miles away from [place].  Provider recommended that his mother take him to the emergency room for evaluation.  She endorsed understanding and agreed.  Provider instructed patient's mother to call 911 if she has difficulty getting him to the hospital.  She endorsed understanding and agreed.  Patient's mother also informed Clinical research associate that she believes the patient is living with her currently and notes that his father is sick which she believes exacerbated his stress.  No other concerns at this time.

## 2023-12-02 ENCOUNTER — Telehealth (HOSPITAL_COMMUNITY): Payer: Self-pay | Admitting: Psychiatry

## 2023-12-03 ENCOUNTER — Encounter (HOSPITAL_COMMUNITY): Payer: Medicare HMO | Admitting: Psychiatry

## 2023-12-03 NOTE — Telephone Encounter (Addendum)
Patient unable to come to his appointment today as he is in [place] visiting his parent. Patients mother continues to note that he is psychotic, not sleeping, and speaking irrationally. She notes that they want to travel over 200 miles to bring him back to [facility]. Provider recommended that he be taken to a facility in [place]. Patient mother hesitant. Provider informed patients mother that 911 would be called in [place] and request a wellness check. She endorsed understanding and agreed. Provider asked 911 dispatchers to do a wellness check and transport him to the nearest ED if he was deemed to be a danger. They endorsed understanding and agreed.  Provider was contacted by [address] [place] police department and was informed that currently the patient is not violent and notes that she feels that he will be safe to travel to [place] tomorrow with his parents.  No other concerns noted at this time.

## 2023-12-04 ENCOUNTER — Ambulatory Visit (HOSPITAL_COMMUNITY)
Admission: EM | Admit: 2023-12-04 | Discharge: 2023-12-06 | Disposition: A | Discharge status: Other Acute Inpatient Hospital | Payer: Medicare HMO | Attending: Psychiatry | Admitting: Psychiatry

## 2023-12-04 DIAGNOSIS — K644 Residual hemorrhoidal skin tags: Secondary | ICD-10-CM | POA: Insufficient documentation

## 2023-12-04 DIAGNOSIS — F250 Schizoaffective disorder, bipolar type: Secondary | ICD-10-CM

## 2023-12-04 DIAGNOSIS — Z79899 Other long term (current) drug therapy: Secondary | ICD-10-CM | POA: Insufficient documentation

## 2023-12-04 DIAGNOSIS — F1290 Cannabis use, unspecified, uncomplicated: Secondary | ICD-10-CM | POA: Diagnosis present

## 2023-12-04 DIAGNOSIS — F23 Brief psychotic disorder: Secondary | ICD-10-CM

## 2023-12-04 DIAGNOSIS — F200 Paranoid schizophrenia: Secondary | ICD-10-CM | POA: Insufficient documentation

## 2023-12-04 DIAGNOSIS — F1210 Cannabis abuse, uncomplicated: Secondary | ICD-10-CM | POA: Diagnosis present

## 2023-12-04 LAB — LIPID PANEL
Cholesterol: 219 mg/dL — ABNORMAL HIGH (ref 0–200)
HDL: 50 mg/dL (ref >40)
LDL Cholesterol: 142 mg/dL — ABNORMAL HIGH (ref 0–99)
Total CHOL/HDL Ratio: 4.4 {ratio}
Triglycerides: 134 mg/dL (ref <150)
VLDL: 27 mg/dL (ref 0–40)

## 2023-12-04 LAB — COMPREHENSIVE METABOLIC PANEL
ALT: 38 U/L (ref 0–44)
AST: 52 U/L — ABNORMAL HIGH (ref 15–41)
Albumin: 4.4 g/dL (ref 3.5–5.0)
Alkaline Phosphatase: 67 U/L (ref 38–126)
Anion gap: 10 (ref 5–15)
BUN: 9 mg/dL (ref 6–20)
CO2: 28 mmol/L (ref 22–32)
Calcium: 9.8 mg/dL (ref 8.9–10.3)
Chloride: 101 mmol/L (ref 98–111)
Creatinine, Ser: 1.03 mg/dL (ref 0.61–1.24)
GFR, Estimated: >60 mL/min (ref >60)
Glucose, Bld: 107 mg/dL — ABNORMAL HIGH (ref 70–99)
Potassium: 5 mmol/L (ref 3.5–5.1)
Sodium: 139 mmol/L (ref 135–145)
Total Bilirubin: 0.8 mg/dL (ref <1.2)
Total Protein: 7.3 g/dL (ref 6.5–8.1)

## 2023-12-04 LAB — POCT URINE DRUG SCREEN - MANUAL ENTRY (I-SCREEN)
POC Amphetamine UR: NOT DETECTED
POC Buprenorphine (BUP): NOT DETECTED
POC Cocaine UR: NOT DETECTED
POC Marijuana UR: POSITIVE — AB
POC Methadone UR: NOT DETECTED
POC Methamphetamine UR: NOT DETECTED
POC Morphine: NOT DETECTED
POC Oxazepam (BZO): NOT DETECTED
POC Oxycodone UR: NOT DETECTED
POC Secobarbital (BAR): NOT DETECTED

## 2023-12-04 LAB — CBC WITH DIFFERENTIAL/PLATELET
Abs Immature Granulocytes: 0.01 10*3/uL (ref 0.00–0.07)
Basophils Absolute: 0.1 10*3/uL (ref 0.0–0.1)
Basophils Relative: 1 %
Eosinophils Absolute: 0.1 10*3/uL (ref 0.0–0.5)
Eosinophils Relative: 2 %
HCT: 44.1 % (ref 39.0–52.0)
Hemoglobin: 15 g/dL (ref 13.0–17.0)
Immature Granulocytes: 0 %
Lymphocytes Relative: 46 %
Lymphs Abs: 3.5 10*3/uL (ref 0.7–4.0)
MCH: 31.1 pg (ref 26.0–34.0)
MCHC: 34 g/dL (ref 30.0–36.0)
MCV: 91.3 fL (ref 80.0–100.0)
Monocytes Absolute: 0.7 10*3/uL (ref 0.1–1.0)
Monocytes Relative: 9 %
Neutro Abs: 3.1 10*3/uL (ref 1.7–7.7)
Neutrophils Relative %: 42 %
Platelets: 383 10*3/uL (ref 150–400)
RBC: 4.83 MIL/uL (ref 4.22–5.81)
RDW: 12 % (ref 11.5–15.5)
WBC: 7.4 10*3/uL (ref 4.0–10.5)
nRBC: 0 % (ref 0.0–0.2)

## 2023-12-04 LAB — URINALYSIS, ROUTINE W REFLEX MICROSCOPIC
Bilirubin Urine: NEGATIVE
Glucose, UA: NEGATIVE mg/dL
Hgb urine dipstick: NEGATIVE
Ketones, ur: NEGATIVE mg/dL
Leukocytes,Ua: NEGATIVE
Nitrite: NEGATIVE
Protein, ur: NEGATIVE mg/dL
Specific Gravity, Urine: 1.014 (ref 1.005–1.030)
pH: 7 (ref 5.0–8.0)

## 2023-12-04 LAB — HEMOGLOBIN A1C
Hgb A1c MFr Bld: 5 % (ref 4.8–5.6)
Mean Plasma Glucose: 96.8 mg/dL

## 2023-12-04 LAB — TSH: TSH: 1.496 u[IU]/mL (ref 0.350–4.500)

## 2023-12-04 LAB — ETHANOL: Alcohol, Ethyl (B): <10 mg/dL (ref <10)

## 2023-12-04 LAB — CK: Total CK: 1507 U/L — ABNORMAL HIGH (ref 49–397)

## 2023-12-04 MED ORDER — OLANZAPINE 5 MG PO TBDP
5.0000 mg | ORAL_TABLET | Freq: Two times a day (BID) | ORAL | Status: DC
  Administered 2023-12-04: 5 mg via ORAL
  Filled 2023-12-04: qty 1

## 2023-12-04 MED ORDER — HYDROXYZINE HCL 25 MG PO TABS
25.0000 mg | ORAL_TABLET | Freq: Three times a day (TID) | ORAL | Status: DC | PRN
  Administered 2023-12-04: 25 mg via ORAL
  Filled 2023-12-04 (×2): qty 1

## 2023-12-04 MED ORDER — HALOPERIDOL 5 MG PO TABS: 5.0000 mg | ORAL_TABLET | Freq: Four times a day (QID) | ORAL | Status: DC | PRN

## 2023-12-04 MED ORDER — OLANZAPINE 5 MG PO TBDP
5.0000 mg | ORAL_TABLET | Freq: Every day | ORAL | Status: DC
  Administered 2023-12-05 – 2023-12-06 (×2): 5 mg via ORAL
  Filled 2023-12-04 (×2): qty 1

## 2023-12-04 MED ORDER — OLANZAPINE 10 MG IM SOLR: 5.0000 mg | Freq: Four times a day (QID) | INTRAMUSCULAR | Status: DC | PRN

## 2023-12-04 MED ORDER — HALOPERIDOL LACTATE 5 MG/ML IJ SOLN: 5.0000 mg | Freq: Four times a day (QID) | INTRAMUSCULAR | Status: DC | PRN

## 2023-12-04 MED ORDER — TRAZODONE HCL 100 MG PO TABS
100.0000 mg | ORAL_TABLET | Freq: Every evening | ORAL | Status: DC | PRN
  Administered 2023-12-05: 100 mg via ORAL
  Filled 2023-12-04 (×2): qty 1

## 2023-12-04 MED ORDER — ACETAMINOPHEN 325 MG PO TABS: 650.0000 mg | ORAL_TABLET | Freq: Four times a day (QID) | ORAL | Status: DC | PRN

## 2023-12-04 MED ORDER — LORAZEPAM 2 MG/ML IJ SOLN: 1.0000 mg | Freq: Four times a day (QID) | INTRAMUSCULAR | Status: DC | PRN

## 2023-12-04 MED ORDER — DIPHENHYDRAMINE HCL 50 MG PO CAPS
50.0000 mg | ORAL_CAPSULE | Freq: Four times a day (QID) | ORAL | Status: DC | PRN
  Administered 2023-12-04 – 2023-12-06 (×3): 50 mg via ORAL
  Filled 2023-12-04 (×3): qty 1

## 2023-12-04 MED ORDER — HALOPERIDOL 5 MG PO TABS
5.0000 mg | ORAL_TABLET | Freq: Two times a day (BID) | ORAL | Status: DC
  Filled 2023-12-04: qty 1

## 2023-12-04 MED ORDER — MAGNESIUM HYDROXIDE 400 MG/5ML PO SUSP: 30.0000 mL | Freq: Every day | ORAL | Status: DC | PRN

## 2023-12-04 MED ORDER — DIPHENHYDRAMINE HCL 50 MG/ML IJ SOLN: 50.0000 mg | Freq: Four times a day (QID) | INTRAMUSCULAR | Status: DC | PRN

## 2023-12-04 MED ORDER — LORAZEPAM 1 MG PO TABS
1.0000 mg | ORAL_TABLET | Freq: Four times a day (QID) | ORAL | Status: DC | PRN
  Administered 2023-12-04 – 2023-12-06 (×3): 1 mg via ORAL
  Filled 2023-12-04 (×3): qty 1

## 2023-12-04 MED ORDER — ALUM & MAG HYDROXIDE-SIMETH 200-200-20 MG/5ML PO SUSP
30.0000 mL | ORAL | Status: DC | PRN
Start: 2023-12-04 — End: 2023-12-06

## 2023-12-04 MED ORDER — OLANZAPINE 15 MG PO TBDP
15.0000 mg | ORAL_TABLET | Freq: Every day | ORAL | Status: DC
  Administered 2023-12-04 – 2023-12-05 (×2): 15 mg via ORAL
  Filled 2023-12-04 (×2): qty 1

## 2023-12-04 NOTE — ED Notes (Addendum)
Patient pulled distress button while in the bathroom. This nurse and Idalia Needle, LPN went to check on patient, this nurse noticed toilet full of blood. Provider Ysidro Evert, NP notified.  Shalon came on the unit and assessed the the blood in the toilet. Shalon asked the patient if he has hemorrhoids, the patient confirmed that he did. NP advised that's where the blood is possibly coming from and for [place] to continue to monitor.

## 2023-12-04 NOTE — ED Notes (Signed)
Patient speech is tangential and pressured. Pt is disorganized. He is cooperative.Pt placed in Flex

## 2023-12-04 NOTE — ED Notes (Signed)
Patient alert, difficult to speak with hyper religious quoting bible verses and yelling.  Pt is yelling pacing the unit.  Prn medication provided to assist in de-escilating pt.  Pt took medications without issue.  Scheduled medications administered to patient, per MD orders. Support and encouragement provided.  Routine safety checks conducted every hour.  Patient informed to notify staff with problems or concerns. No adverse drug reactions noted.   Patient remains safe at this time.

## 2023-12-04 NOTE — ED Notes (Signed)
  The patient is pacing in the flex unit, responding to internal stimuli. No acute distress noted. Environment is secured. Will continue to monitor for safety.

## 2023-12-04 NOTE — ED Notes (Signed)
The patient is lying in bed, responding to internal stimuli. No acute distress noted. Environment is secured. Will continue to monitor for safety.

## 2023-12-04 NOTE — Progress Notes (Signed)
   12/04/23 1248  [facility] Triage Screening (Walk-ins at [facility] only)  How Did You Hear About [place]? Self  What Is the Reason for Your Visit/Call Today? Patient is a 37 year old male that presents this date as a voluntary walk in to [facility] with his mother Lucas Knapp [phone]. Mother provides collateral as patient is observed to be a poor historian and is very tangential. Patient is difficult to redirect and speaks at length in reference to multiple topics unrelated to this writer's questions. Patient per mother, has been visiting her in [place] for the last 2 weeks and as of the last 3 days has been displaying bizarre behaviors. Patient per mother has been responding to internal stimuli and she is concerned for his safety. Patient denies any S/I, H/I or VH although states he constantly, "hears sirens in his head." Patient has a hsitory significant for Schizophrenia and receives OP services from [facility].  How Long Has This Been Causing You Problems? <Week  Have You Recently Had Any Thoughts About Hurting Yourself? No  Are You Planning to Commit Suicide/Harm Yourself At This time? No  Have you Recently Had Thoughts About Hurting Someone [name]? No  Are You Planning To Harm Someone At This Time? No  Physical Abuse Denies  Verbal Abuse Denies  Sexual Abuse Denies  Exploitation of patient/patient's resources Denies  Self-Neglect Denies  Possible abuse reported to:  (NA)  Are you currently experiencing any auditory, visual or other hallucinations? Yes  Please explain the hallucinations you are currently experiencing: Pt reports ongoing AH stating he, "hears sirens."  Have You Used Any Alcohol or Drugs in the Past 24 Hours? No  Do you have any current medical co-morbidities that require immediate attention? No  Clinician description of patient physical appearance/behavior: Pt is observed to be very tangential and difficult to redirect  What Do You Feel Would Help You the Most Today? Medication(s)   If access to [facility] was not available, would you have sought care in the Emergency Department? No  Determination of Need Urgent (48 hours)  Options For Referral Other: Comment (To be determined)  Determination of Need filed? Yes

## 2023-12-04 NOTE — ED Notes (Addendum)
[employer] Non-emergency has been called to serve IVC paper work which was initiated by Dr. Theodis Aguas

## 2023-12-04 NOTE — ED Provider Notes (Cosign Needed Addendum)
[facility] Continuous Assessment Admission H&P  Date: 12/04/23 Patient Name: Lucas Knapp MRN: [id number] Chief Complaint: Psychosis  Diagnoses:  Final diagnoses:  Acute psychosis (HCC)  Schizoaffective disorder, bipolar type [facility])    HPI:  Lucas Knapp is a 37 year old male with past psychiatric history of paranoid schizophrenia, bipolar disorder, schizoaffective disorder, and cannabis use disorder.  UDS is positive for THC.  Patient was brought by mother to [facility] on 12/04/2023 for concerns of worsening psychosis and agitation.  When evaluated the patient was acutely psychotic and was unable to answer any questions, was observed shouting.  IVC was petitioned with recommendations for inpatient psychiatric hospitalization.  Attempted to evaluate patient in the assessment room.  He is shouting reporting he has been hearing radio frequencies going through his head including news channels which have kept him up for several days.  He makes multiple bizarre and illogical statements, his thought processes  is overall disorganized, his associations are loose.  He is unable to answer any questions regarding ongoing mood, sleep, appetite, or other psychiatric symptoms.  He is unable to remain seated during the interview, often standing and pacing.  He appears to be responding to internal stimuli, makes minimal to no eye contact with this provider and intermittently looks up at the ceiling.  The patient's current home medications include Seroquel 50 mg nightly and Cogentin 1 mg twice daily.  Prior to this he had been on Haldol twice daily, but reported an allergic reaction to his outpatient provider on 11/05/2023, which was when the medication was discontinued.   Total Time spent with patient: 45 minutes  Musculoskeletal  Strength & Muscle Tone: within normal limits Gait & Station: normal Patient leans: N/A  Psychiatric Specialty Exam  Presentation General Appearance: Disheveled  (Malodorous)  Eye Contact:Fleeting  Speech:Pressured  Speech Volume:Normal  Handedness:-- (Unable to assess)   Mood and Affect  Mood:-- (Unable to state)  Affect:Labile   Thought Process  Thought Processes:Irrevelant; Disorganized  Descriptions of Associations:Loose  Orientation:None  Thought Content:Illogical Diagnosis of Schizophrenia or Schizoaffective disorder in past: Yes Duration of Psychotic Symptoms:> 6 months Hallucinations:Hallucinations: -- (Appear to be responding to internal stimuli)  Ideas of Reference:Paranoia  Suicidal Thoughts:Suicidal Thoughts: -- (Unable to formally assess due to ongoing psychosis.)  Homicidal Thoughts:Homicidal Thoughts: -- (Unable to assess due to ongoing psychosis.)   Sensorium  Memory:-- (Impaired)  Judgment:Impaired  Insight:None   Executive Functions  Concentration:Poor  Attention Span:Poor  Recall:Poor  Fund of Knowledge:Poor  Language:Poor   Psychomotor Activity  Psychomotor Activity:Psychomotor Activity: Increased   Assets  Assets:Desire for Improvement; Resilience; Communication Skills   Sleep  Sleep:Sleep: Poor   No data recorded  Physical Exam Constitutional:      Comments: Observed agitated, pacing  Psychiatric:        Attention and Perception: He is inattentive.        Mood and Affect: Affect is labile.        Behavior: Behavior is hyperactive.        Cognition and Memory: Cognition is impaired. Memory is impaired.        Judgment: Judgment is impulsive.    Review of Systems  Unable to perform ROS: Psychiatric disorder    There were no vitals taken for this visit. There is no height or weight on file to calculate BMI.  Past Psychiatric History obtained from chart review:  Prior diagnoses include paranoid schizophrenia, bipolar disorder, schizoaffective disorder, and cannabis use disorder.  He is seen by Gretchen Short, NP  at [facility].  Patient was previously on Haldol 5 mg every  morning +10 mg every afternoon.  Patient reported allergic reaction to Haldol with tongue swelling in November, which is when outpatient provider switched him to Seroquel 50 mg nightly.  Patient has a previous diagnosis of paranoid schizophrenia.  Per chart review, the patient was last admitted to the [facility] in 2019.  At that time the patient carried a diagnosis of paranoid schizophrenia.  He was started on Haldol which was titrated to a dose of 10 mg in the morning and 15 mg at night.  For a long period of time, the patient saw [name] at the [facility] on an outpatient basis.  The patient complained about his Haldol and the provider switched him to Zyprexa 10 mg nightly.  There is no specification for why the patient did not like his Haldol.  3 months later the patient was [facility].  They report that the patient stabilized on Abilify 10 mg.  It does not appear that he received an LAI.  Outpatient provider has noted the patient has failed Invega.    Past Medical History: Unable to assess.  No significant past medical history identified on chart review Allergy to Geodon and Haldol  Family History: Unable to assess  Social History: Unable to assess due to ongoing psychosis  Last Labs:  Admission on 12/04/2023  Component Date Value Ref Range Status   Color, Urine 12/04/2023 YELLOW  YELLOW Final   APPearance 12/04/2023 CLEAR  CLEAR Final   Specific Gravity, Urine 12/04/2023 1.014  1.005 - 1.030 Final   pH 12/04/2023 7.0  5.0 - 8.0 Final   Glucose, UA 12/04/2023 NEGATIVE  NEGATIVE mg/dL Final   Hgb urine dipstick 12/04/2023 NEGATIVE  NEGATIVE Final   Bilirubin Urine 12/04/2023 NEGATIVE  NEGATIVE Final   Ketones, ur 12/04/2023 NEGATIVE  NEGATIVE mg/dL Final   Protein, ur 86/57/8469 NEGATIVE  NEGATIVE mg/dL Final   Nitrite 62/95/2841 NEGATIVE  NEGATIVE Final   Leukocytes,Ua 12/04/2023  NEGATIVE  NEGATIVE Final   Performed at [facility], [address] [place], [place] [address]   POC Amphetamine UR 12/04/2023 None Detected  NONE DETECTED (Cut Off Level 1000 ng/mL) Final   POC Secobarbital (BAR) 12/04/2023 None Detected  NONE DETECTED (Cut Off Level 300 ng/mL) Final   POC Buprenorphine (BUP) 12/04/2023 None Detected  NONE DETECTED (Cut Off Level 10 ng/mL) Final   POC Oxazepam (BZO) 12/04/2023 None Detected  NONE DETECTED (Cut Off Level 300 ng/mL) Final   POC Cocaine UR 12/04/2023 None Detected  NONE DETECTED (Cut Off Level 300 ng/mL) Final   POC Methamphetamine UR 12/04/2023 None Detected  NONE DETECTED (Cut Off Level 1000 ng/mL) Final   POC Morphine 12/04/2023 None Detected  NONE DETECTED (Cut Off Level 300 ng/mL) Final   POC Methadone UR 12/04/2023 None Detected  NONE DETECTED (Cut Off Level 300 ng/mL) Final   POC Oxycodone UR 12/04/2023 None Detected  NONE DETECTED (Cut Off Level 100 ng/mL) Final   POC Marijuana UR 12/04/2023 Positive (A)  NONE DETECTED (Cut Off Level 50 ng/mL) Final  Orders Only on 08/18/2023  Component Date Value Ref Range Status   WBC 08/18/2023 6.8  3.4 - 10.8 x10E3/uL Final   RBC 08/18/2023 5.35  4.14 - 5.80 x10E6/uL Final   Hemoglobin 08/18/2023 17.1  13.0 - 17.7 g/dL Final   Hematocrit 10/18/2535 50.8  37.5 - 51.0 %  Final   MCV 08/18/2023 95  79 - 97 fL Final   MCH 08/18/2023 32.0  26.6 - 33.0 pg Final   MCHC 08/18/2023 33.7  31.5 - 35.7 g/dL Final   RDW 16/09/9603 11.9  11.6 - 15.4 % Final   Platelets 08/18/2023 386  150 - 450 x10E3/uL Final   Neutrophils 08/18/2023 62  Not Estab. % Final   Lymphs 08/18/2023 32  Not Estab. % Final   Monocytes 08/18/2023 5  Not Estab. % Final   Eos 08/18/2023 0  Not Estab. % Final   Basos 08/18/2023 1  Not Estab. % Final   Neutrophils Absolute 08/18/2023 4.2  1.4 - 7.0 x10E3/uL Final   Lymphocytes Absolute 08/18/2023 2.2  0.7 - 3.1 x10E3/uL Final   Monocytes Absolute 08/18/2023 0.3  0.1 - 0.9  x10E3/uL Final   EOS (ABSOLUTE) 08/18/2023 0.0  0.0 - 0.4 x10E3/uL Final   Basophils Absolute 08/18/2023 0.1  0.0 - 0.2 x10E3/uL Final   Immature Granulocytes 08/18/2023 0  Not Estab. % Final   Immature Grans (Abs) 08/18/2023 0.0  0.0 - 0.1 x10E3/uL Final   Glucose 08/18/2023 96  70 - 99 mg/dL Final   BUN 54/08/8118 8  6 - 20 mg/dL Final   Creatinine, Ser 08/18/2023 0.94  0.76 - 1.27 mg/dL Final   eGFR 14/78/2956 106  >59 mL/min/1.73 Final   BUN/Creatinine Ratio 08/18/2023 9  9 - 20 Final   Sodium 08/18/2023 140  134 - 144 mmol/L Final   Potassium 08/18/2023 4.3  3.5 - 5.2 mmol/L Final   Chloride 08/18/2023 102  96 - 106 mmol/L Final   CO2 08/18/2023 21  20 - 29 mmol/L Final   Calcium 08/18/2023 9.9  8.7 - 10.2 mg/dL Final   Total Protein 21/30/8657 7.0  6.0 - 8.5 g/dL Final   Albumin 84/69/6295 4.4  4.1 - 5.1 g/dL Final   Globulin, Total 08/18/2023 2.6  1.5 - 4.5 g/dL Final   Bilirubin Total 08/18/2023 0.3  0.0 - 1.2 mg/dL Final   Alkaline Phosphatase 08/18/2023 95  44 - 121 IU/L Final   AST 08/18/2023 23  0 - 40 IU/L Final   ALT 08/18/2023 15  0 - 44 IU/L Final   Bilirubin, Direct 08/18/2023 <0.10  0.00 - 0.40 mg/dL Final   Cholesterol, Total 08/18/2023 211 (H)  100 - 199 mg/dL Final   Triglycerides 28/41/3244 79  0 - 149 mg/dL Final   HDL 12/24/7251 50  >39 mg/dL Final   VLDL Cholesterol Cal 08/18/2023 14  5 - 40 mg/dL Final   LDL Chol Calc (NIH) 08/18/2023 147 (H)  0 - 99 mg/dL Final   Chol/HDL Ratio 08/18/2023 4.2  0.0 - 5.0 ratio Final   Comment:                                   T. Chol/HDL Ratio                                             Men  Women                               1/2 Avg.Risk  3.4    3.3  Avg.Risk  5.0    4.4                                2X Avg.Risk  9.6    7.1                                3X Avg.Risk 23.4   11.0    TSH 08/18/2023 2.200  0.450 - 4.500 uIU/mL Final   T4, Total 08/18/2023 7.7  4.5 - 12.0 ug/dL Final   T3  Uptake Ratio 08/18/2023 25  24 - 39 % Final   Free Thyroxine Index 08/18/2023 1.9  1.2 - 4.9 Final   Hgb A1c MFr Bld 08/18/2023 5.4  4.8 - 5.6 % Final   Comment:          Prediabetes: 5.7 - 6.4          Diabetes: >6.4          Glycemic control for adults with diabetes: <7.0    Est. average glucose Bld gHb Est-m* 08/18/2023 108  mg/dL Final   Prolactin 16/09/9603 4.7  3.9 - 22.7 ng/mL Final   specimen status report 08/18/2023 Comment   Final   Comment: Please note Please note The date and/or time of collection was not indicated on the requisition as required by state and federal law.  The date of receipt of the specimen was used as the collection date if not supplied.     Allergies: Geodon [ziprasidone hcl] and Haldol [haloperidol]  Medications:  Facility Ordered Medications  Medication   acetaminophen (TYLENOL) tablet 650 mg   alum & mag hydroxide-simeth (MAALOX/MYLANTA) 200-200-20 MG/5ML suspension 30 mL   magnesium hydroxide (MILK OF MAGNESIA) suspension 30 mL   traZODone (DESYREL) tablet 100 mg   hydrOXYzine (ATARAX) tablet 25 mg   diphenhydrAMINE (BENADRYL) capsule 50 mg   Or   diphenhydrAMINE (BENADRYL) injection 50 mg   LORazepam (ATIVAN) tablet 1 mg   Or   LORazepam (ATIVAN) injection 1 mg   OLANZapine zydis (ZYPREXA) disintegrating tablet 5 mg   OLANZapine (ZYPREXA) injection 5 mg   PTA Medications  Medication Sig   benztropine (COGENTIN) 1 MG tablet Take 1 tablet (1 mg total) by mouth 2 (two) times daily. (Patient taking differently: Take 1 tablet (1 mg total) by mouth 2 (two) times daily.)   QUEtiapine (SEROQUEL) 50 MG tablet Take 1 tablet (50 mg total) by mouth at bedtime.      Medical Decision Making  The patient carries the established diagnoses of paranoid schizophrenia, bipolar disorder, schizoaffective disorder, and cannabis use disorder.  He is floridly psychotic on evaluation, with a very limited interview.  His UDS is positive for THC, suggesting  possible substance-induced psychosis as well as concerns for medication noncompliance.  Psychiatric Diagnoses: Schizoaffective disorder, bipolar type, current episode manic versus substance-induced psychosis Cannabis use disorder by history   Psychiatric Diagnoses and Treatment:  Start Zyprexa 5 mg every 12 hours for mania and psychosis   Medical Issues Being Addressed: None   Other PRNs: Agitation protocol (entheses Benadryl, Ativan, Zyprexa) Trazodone 100 mg nightly as needed Tylenol 650 mg every 6 hours as needed Maalox Mylanta every 4 hours as needed Atarax 25 mg 3 times daily as needed   Other Labs/Imaging Reviewed: UDS + THC UA CK Ethanol TSH A1c Lipid panel CMP CBC CBG COVID  EKG pending  Disposition: IVC petition has been submitted and approved.  Recommending inpatient psychiatric admission, once routine labs have resulted.    Recommendations  Based on my evaluation the patient does not appear to have an emergency medical condition.  Lorri Frederick, MD 12/04/23  4:33 PM

## 2023-12-05 DIAGNOSIS — F200 Paranoid schizophrenia: Secondary | ICD-10-CM

## 2023-12-05 DIAGNOSIS — F23 Brief psychotic disorder: Secondary | ICD-10-CM | POA: Diagnosis not present

## 2023-12-05 DIAGNOSIS — F250 Schizoaffective disorder, bipolar type: Secondary | ICD-10-CM | POA: Diagnosis not present

## 2023-12-05 LAB — GLUCOSE, CAPILLARY: Glucose-Capillary: 125 mg/dL — ABNORMAL HIGH (ref 70–99)

## 2023-12-05 MED ORDER — SALINE SPRAY 0.65 % NA SOLN
1.0000 | Freq: Two times a day (BID) | NASAL | Status: DC | PRN
Start: 2023-12-05 — End: 2023-12-06
  Administered 2023-12-05 – 2023-12-06 (×2): 1 via NASAL
  Filled 2023-12-05: qty 44

## 2023-12-05 MED ORDER — OLANZAPINE 5 MG PO TBDP
5.0000 mg | ORAL_TABLET | Freq: Once | ORAL | Status: AC
  Administered 2023-12-05: 5 mg via ORAL
  Filled 2023-12-05: qty 1

## 2023-12-05 MED ORDER — OLANZAPINE 10 MG IM SOLR: 5.0000 mg | Freq: Once | INTRAMUSCULAR | Status: AC

## 2023-12-05 MED ORDER — WITCH HAZEL-GLYCERIN EX PADS: MEDICATED_PAD | Freq: Four times a day (QID) | CUTANEOUS | Status: DC | PRN

## 2023-12-05 NOTE — ED Notes (Signed)
Patient taking a shower.
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
## 2023-12-05 NOTE — ED Notes (Signed)
Pt sleeping@this time breathing even and unlabored will continue to monitor for safety 

## 2023-12-05 NOTE — ED Notes (Signed)
Patient A&Ox4. Denies intent to harm self/others when asked. Denies VH. Reports AH "noises in my head all the time". Patient denies any physical complaints when asked. No acute distress noted. Support and encouragement provided. Routine safety checks conducted according to facility protocol. Encouraged patient to notify staff if thoughts of harm toward self or others arise. Endorses safety. Patient verbalized understanding and agreement. Will continue to monitor for safety.

## 2023-12-05 NOTE — ED Notes (Signed)
Provider aware pt had bloody BM.  No new orders at this time. Pants are soiled from prior bm.  Attempted to change pants but pt wanted to return to bed. Will continue to monitor for safety.

## 2023-12-05 NOTE — ED Notes (Signed)
Patient resting with eyes closed. Respirations even and unlabored. No acute distress noted. Environment secured. Will continue to monitor for safety.

## 2023-12-05 NOTE — ED Notes (Signed)
Pt is awake talking loudly to himself he is re directive no c/o pain or distress will continue  to monitor for safety

## 2023-12-05 NOTE — ED Notes (Signed)
The patient is lying in bed, responding to internal stimuli. No acute distress noted. Environment is secured. Will continue to monitor for safety.

## 2023-12-05 NOTE — ED Notes (Addendum)
Pt awake speaking loudly to self.  Going up to badge reader and talking to it. Pt agreed to take prn medication to help with agitation.

## 2023-12-05 NOTE — ED Notes (Signed)
In bed resting quietly.

## 2023-12-05 NOTE — ED Notes (Signed)
The patient is pacing in the flex unit, responding to internal stimuli. No acute distress noted. Environment is secured. Will continue to monitor for safety.

## 2023-12-06 ENCOUNTER — Encounter (HOSPITAL_COMMUNITY): Payer: Self-pay | Admitting: Behavioral Health

## 2023-12-06 ENCOUNTER — Inpatient Hospital Stay (HOSPITAL_COMMUNITY)
Admission: AD | Admit: 2023-12-06 | Discharge: 2023-12-31 | DRG: 885 | Disposition: A | Discharge status: Home / Self Care | Payer: Medicare HMO | Source: Intra-hospital | Attending: Psychiatry | Admitting: Psychiatry

## 2023-12-06 ENCOUNTER — Other Ambulatory Visit: Payer: Self-pay

## 2023-12-06 DIAGNOSIS — M62830 Muscle spasm of back: Secondary | ICD-10-CM | POA: Diagnosis present

## 2023-12-06 DIAGNOSIS — G8929 Other chronic pain: Secondary | ICD-10-CM | POA: Diagnosis present

## 2023-12-06 DIAGNOSIS — Z79899 Other long term (current) drug therapy: Secondary | ICD-10-CM | POA: Diagnosis not present

## 2023-12-06 DIAGNOSIS — M549 Dorsalgia, unspecified: Secondary | ICD-10-CM | POA: Diagnosis present

## 2023-12-06 DIAGNOSIS — F17210 Nicotine dependence, cigarettes, uncomplicated: Secondary | ICD-10-CM | POA: Diagnosis present

## 2023-12-06 DIAGNOSIS — G4700 Insomnia, unspecified: Secondary | ICD-10-CM | POA: Diagnosis present

## 2023-12-06 DIAGNOSIS — Z818 Family history of other mental and behavioral disorders: Secondary | ICD-10-CM

## 2023-12-06 DIAGNOSIS — Z8349 Family history of other endocrine, nutritional and metabolic diseases: Secondary | ICD-10-CM

## 2023-12-06 DIAGNOSIS — F1210 Cannabis abuse, uncomplicated: Secondary | ICD-10-CM | POA: Diagnosis present

## 2023-12-06 DIAGNOSIS — F250 Schizoaffective disorder, bipolar type: Secondary | ICD-10-CM | POA: Diagnosis present

## 2023-12-06 DIAGNOSIS — E785 Hyperlipidemia, unspecified: Secondary | ICD-10-CM | POA: Diagnosis present

## 2023-12-06 DIAGNOSIS — Z59819 Housing instability, housed unspecified: Secondary | ICD-10-CM | POA: Diagnosis present

## 2023-12-06 DIAGNOSIS — I10 Essential (primary) hypertension: Secondary | ICD-10-CM | POA: Diagnosis present

## 2023-12-06 DIAGNOSIS — F200 Paranoid schizophrenia: Secondary | ICD-10-CM | POA: Diagnosis present

## 2023-12-06 DIAGNOSIS — Z91410 Personal history of adult physical and sexual abuse: Secondary | ICD-10-CM | POA: Diagnosis not present

## 2023-12-06 DIAGNOSIS — Z8249 Family history of ischemic heart disease and other diseases of the circulatory system: Secondary | ICD-10-CM | POA: Diagnosis not present

## 2023-12-06 DIAGNOSIS — F17200 Nicotine dependence, unspecified, uncomplicated: Secondary | ICD-10-CM | POA: Diagnosis present

## 2023-12-06 DIAGNOSIS — F1290 Cannabis use, unspecified, uncomplicated: Secondary | ICD-10-CM | POA: Diagnosis present

## 2023-12-06 LAB — CK: Total CK: 724 U/L — ABNORMAL HIGH (ref 49–397)

## 2023-12-06 MED ORDER — OLANZAPINE 5 MG PO TBDP
5.0000 mg | ORAL_TABLET | Freq: Three times a day (TID) | ORAL | Status: DC | PRN
  Administered 2023-12-06 – 2023-12-21 (×6): 5 mg via ORAL
  Filled 2023-12-06 (×6): qty 1

## 2023-12-06 MED ORDER — ACETAMINOPHEN 325 MG PO TABS
650.0000 mg | ORAL_TABLET | Freq: Four times a day (QID) | ORAL | Status: DC | PRN
  Administered 2023-12-07 – 2023-12-11 (×4): 650 mg via ORAL
  Filled 2023-12-06 (×5): qty 2

## 2023-12-06 MED ORDER — DIPHENHYDRAMINE HCL 25 MG PO CAPS
25.0000 mg | ORAL_CAPSULE | Freq: Once | ORAL | Status: AC | PRN
  Administered 2023-12-07: 25 mg via ORAL
  Filled 2023-12-06: qty 1

## 2023-12-06 MED ORDER — MAGNESIUM HYDROXIDE 400 MG/5ML PO SUSP: 30.0000 mL | Freq: Every day | ORAL | Status: DC | PRN

## 2023-12-06 MED ORDER — ALUM & MAG HYDROXIDE-SIMETH 200-200-20 MG/5ML PO SUSP
30.0000 mL | ORAL | Status: DC | PRN
  Administered 2023-12-07: 30 mL via ORAL
  Filled 2023-12-06: qty 30

## 2023-12-06 MED ORDER — OLANZAPINE 10 MG IM SOLR: 5.0000 mg | Freq: Three times a day (TID) | INTRAMUSCULAR | Status: DC | PRN

## 2023-12-06 MED ORDER — OLANZAPINE 10 MG IM SOLR
10.0000 mg | Freq: Three times a day (TID) | INTRAMUSCULAR | Status: DC | PRN
  Administered 2023-12-06: 10 mg via INTRAMUSCULAR
  Filled 2023-12-06: qty 10

## 2023-12-06 MED ORDER — OLANZAPINE 10 MG PO TBDP
10.0000 mg | ORAL_TABLET | Freq: Every day | ORAL | Status: DC
  Administered 2023-12-06: 10 mg via ORAL
  Filled 2023-12-06 (×3): qty 1

## 2023-12-06 MED ORDER — TRAZODONE HCL 100 MG PO TABS
100.0000 mg | ORAL_TABLET | Freq: Every evening | ORAL | Status: DC | PRN
  Administered 2023-12-06 – 2023-12-15 (×8): 100 mg via ORAL
  Filled 2023-12-06 (×9): qty 1

## 2023-12-06 MED ORDER — WITCH HAZEL-GLYCERIN EX PADS: MEDICATED_PAD | Freq: Four times a day (QID) | CUTANEOUS | Status: DC | PRN

## 2023-12-06 MED ORDER — HYDROXYZINE HCL 25 MG PO TABS
25.0000 mg | ORAL_TABLET | Freq: Three times a day (TID) | ORAL | Status: DC | PRN
  Administered 2023-12-06 – 2023-12-21 (×15): 25 mg via ORAL
  Filled 2023-12-06 (×16): qty 1

## 2023-12-06 MED ORDER — SALINE SPRAY 0.65 % NA SOLN
1.0000 | Freq: Two times a day (BID) | NASAL | Status: DC | PRN
Start: 2023-12-06 — End: 2023-12-31
  Administered 2023-12-07 – 2023-12-30 (×21): 1 via NASAL
  Filled 2023-12-06 (×4): qty 44

## 2023-12-06 NOTE — ED Notes (Signed)
[facility] called for patient transport to [facility].

## 2023-12-06 NOTE — Plan of Care (Signed)
  Problem: Education: Goal: Knowledge of [facility] Education information/materials will improve Outcome: Progressing Goal: Emotional status will improve Outcome: Progressing Goal: Mental status will improve Outcome: Progressing Goal: Verbalization of understanding the information provided will improve Outcome: Progressing   Problem: Activity: Goal: Interest or engagement in activities will improve Outcome: Progressing Goal: Sleeping patterns will improve Outcome: Progressing   Problem: Coping: Goal: Ability to verbalize frustrations and anger appropriately will improve Outcome: Progressing Goal: Ability to demonstrate self-control will improve Outcome: Progressing   Problem: Health Behavior/Discharge Planning: Goal: Identification of resources available to assist in meeting health care needs will improve Outcome: Progressing Goal: Compliance with treatment plan for underlying cause of condition will improve Outcome: Progressing   Problem: Physical Regulation: Goal: Ability to maintain clinical measurements within normal limits will improve Outcome: Progressing   Problem: Safety: Goal: Periods of time without injury will increase Outcome: Progressing   Problem: Activity: Goal: Will verbalize the importance of balancing activity with adequate rest periods Outcome: Progressing   Problem: Education: Goal: Will be free of psychotic symptoms Outcome: Progressing Goal: Knowledge of the prescribed therapeutic regimen will improve Outcome: Progressing   Problem: Coping: Goal: Coping ability will improve Outcome: Progressing Goal: Will verbalize feelings Outcome: Progressing   Problem: Health Behavior/Discharge Planning: Goal: Compliance with prescribed medication regimen will improve Outcome: Progressing   Problem: Nutritional: Goal: Ability to achieve adequate nutritional intake will improve Outcome: Progressing   Problem: Role Relationship: Goal:  Ability to communicate needs accurately will improve Outcome: Progressing Goal: Ability to interact with others will improve Outcome: Progressing   Problem: Safety: Goal: Ability to redirect hostility and anger into socially appropriate behaviors will improve Outcome: Progressing Goal: Ability to remain free from injury will improve Outcome: Progressing   Problem: Self-Care: Goal: Ability to participate in self-care as condition permits will improve Outcome: Progressing   Problem: Self-Concept: Goal: Will verbalize positive feelings about self Outcome: Progressing   

## 2023-12-06 NOTE — Progress Notes (Signed)
Patient has been pacing and responding to internal stimuli " the frequency is getting louder and the cameras are recording" Prn vistaril, Zyprexa and Trazodone given at HS medications where not effective. Patient continues to be Tangential and disorganized. When you ask his name he states [phone] is my name I don't know who Andrzej is. Support provided.

## 2023-12-06 NOTE — B[facility] Group Notes (Signed)
Adult Psychoeducational Group Note  Date:  12/06/2023 Time:  9:04 PM  Group Topic/Focus:  Wrap-Up Group:   The focus of this group is to help patients review their daily goal of treatment and discuss progress on daily workbooks.  Participation Level:  Active  Participation Quality:  Inattentive, Redirectable, and Resistant  Affect:  Anxious and Defensive  Cognitive:  Disorganized and Delusional  Insight: Limited  Engagement in Group:  Distracting and Off Topic  Modes of Intervention:  Discussion  Additional Comments:   Pt was extremely tangential during group discussion and often experienced flight of ideas during his responses. Pt states he's had a rough day due to people only talking about medications to him and having a tough conversation with his parents. Pt denied everything   Vevelyn Pat 12/06/2023, 9:04 PM

## 2023-12-06 NOTE — Discharge Instructions (Addendum)
Patient accepted to [facility] today.

## 2023-12-06 NOTE — Progress Notes (Signed)
Pt admitted at this time. Pt arrive via [employer]. Pt presents paranoid with irritable affect, demanded not to be called Tymier, states his name is "[phone]". Pt presents with disorganized speech. Pt refused paperwork, took clothes off during assessment. Pt refused to sit for vs, did allow nurse to take vs while standing. Pt noted to have rash on lower back. When nurse inquired pt states he has taken many different medicines but poor historian about rash. Pt minimally cooperative with assessment, brought back to unit. Pt declined a lunch tray, snacks given per his request.

## 2023-12-06 NOTE — ED Notes (Addendum)
Patient agitated banging on doors and very boisterous. Patient unable to redirect at this time. Patient was medicate per PRN orders. Patient took PO medication without resistance. Encouragement and support provided and safety maintain.

## 2023-12-06 NOTE — ED Notes (Signed)
Report called to Efraim Kaufmann, RN at [facility].

## 2023-12-06 NOTE — Progress Notes (Signed)
Pt approached nurse apologizing for irritability displayed previously. Pt remains with tangential speech, talking about police abusing their power and endorses hearing sirens in his head. Pts affect is calm at this time.

## 2023-12-06 NOTE — ED Provider Notes (Signed)
FBC/OBS ASAP Discharge Summary  Date and Time: 12/06/2023 10:22 AM  Name: Lucas Knapp  MRN:  [id number]   Discharge Diagnoses:  Final diagnoses:  Paranoid schizophrenia [facility])    Subjective: Patient seen and evaluated face to face by this provider, chart reviewed and cased discussed with Dr. Viviano Simas. On evaluation, patient is lying down on the recliner in no acute distress. He is alert and oriented x 2. He tells me not to call him Tjuan and tells me to call him number [phone] or [phone]. He tells me not to call him by his government name Jaxx Tlatelpa. He states that he is at behavioral health. He states that the month is December. He states that the year is 39. His thought process his disorganized with scattered and irrelevant thought content. His speech is slow and tangential. His mood is labile. On approach, he was calm and cooperative. He denies SI/HI. When asked if he is experiencing hallucinations, he states, "to be continued." When asked about sleep, he states, "the medicine in the palace" and goes off on a tangent about [employer] gangs. When asked about appetite, he states, "they are treating me like a baby, they need to give me a menu." He states that he's been interrogated. Patient reassured that he's being asked daily assessment questions. Patient does not report any additional concerns on exam. Patient has been compliant with taking scheduled medications, without any noticeable side effects observed on exam.   Stay Summary: Per chart review on 12/04/23, initial assessment: "Branch Pelly is a 37 year old male with past psychiatric history of paranoid schizophrenia, bipolar disorder, schizoaffective disorder, and cannabis use disorder.  UDS is positive for THC.  Patient was brought by mother to [facility] on 12/04/2023 for concerns of worsening psychosis and agitation.  When evaluated the patient was acutely psychotic and was unable to answer any questions, was observed shouting.  IVC was  petitioned with recommendations for inpatient psychiatric hospitalization.   Attempted to evaluate patient in the assessment room. He is shouting reporting he has been hearing radio frequencies going through his head including news channels which have kept him up for several days.  He makes multiple bizarre and illogical statements, his thought processes  is overall disorganized, his associations are loose. He is unable to answer any questions regarding ongoing mood, sleep, appetite, or other psychiatric symptoms.  He is unable to remain seated during the interview, often standing and pacing.  He appears to be responding to internal stimuli, makes minimal to no eye contact with this provider and intermittently looks up at the ceiling.   The patient's current home medications include Seroquel 50 mg nightly and Cogentin 1 mg twice daily.  Prior to this he had been on Haldol twice daily, but reported an allergic reaction to his outpatient provider on 11/05/2023, which was when the medication was discontinued."  Total Time spent with patient: 30 minutes  Past Psychiatric History: schizophrenia, paranoid type, bipolar 1 disorder, and cannabis use disorder.  Past Medical History: anemia, hypertension, and hemorrhoids.   Family History: No reported history.   Family Psychiatric  History: Per chart review, cousin hx of  ADHD, twin brother hx of schizophrenia/schizoaffective bipolar type.  Social History: Single. Cannabis use.  Tobacco Cessation:  A prescription for an FDA-approved tobacco cessation medication was offered at discharge and the patient refused  Current Medications:  Current Facility-Administered Medications  Medication Dose Route Frequency Provider Last Rate Last Admin   acetaminophen (TYLENOL) tablet 650 mg  650 mg Oral  Q6H PRN Carrion-Carrero, Karle Starch, MD       alum & mag hydroxide-simeth (MAALOX/MYLANTA) 200-200-20 MG/5ML suspension 30 mL  30 mL Oral Q4H PRN Carrion-Carrero, Margely,  MD       diphenhydrAMINE (BENADRYL) capsule 50 mg  50 mg Oral Q6H PRN Carrion-Carrero, Margely, MD   50 mg at 12/06/23 0403   Or   diphenhydrAMINE (BENADRYL) injection 50 mg  50 mg Intramuscular Q6H PRN Carrion-Carrero, Karle Starch, MD       hydrOXYzine (ATARAX) tablet 25 mg  25 mg Oral TID PRN Carrion-Carrero, Karle Starch, MD   25 mg at 12/04/23 2001   LORazepam (ATIVAN) tablet 1 mg  1 mg Oral Q6H PRN Carrion-Carrero, Karle Starch, MD   1 mg at 12/06/23 0403   magnesium hydroxide (MILK OF MAGNESIA) suspension 30 mL  30 mL Oral Daily PRN Carrion-Carrero, Margely, MD       OLANZapine (ZYPREXA) injection 5 mg  5 mg Intramuscular Q6H PRN Carrion-Carrero, Margely, MD       OLANZapine zydis (ZYPREXA) disintegrating tablet 15 mg  15 mg Oral QHS Carrion-Carrero, Margely, MD   15 mg at 12/05/23 2109   OLANZapine zydis (ZYPREXA) disintegrating tablet 5 mg  5 mg Oral Daily Carrion-Carrero, Margely, MD   5 mg at 12/06/23 0902   sodium chloride (OCEAN) 0.65 % nasal spray 1 spray  1 spray Each Nare BID PRN Lenard Lance, FNP   1 spray at 12/05/23 1838   traZODone (DESYREL) tablet 100 mg  100 mg Oral QHS PRN Lorri Frederick, MD   100 mg at 12/05/23 2109   witch hazel-glycerin (TUCKS) pad   Topical QID PRN Lenard Lance, FNP       No current outpatient medications on file.    PTA Medications:  Facility Ordered Medications  Medication   acetaminophen (TYLENOL) tablet 650 mg   alum & mag hydroxide-simeth (MAALOX/MYLANTA) 200-200-20 MG/5ML suspension 30 mL   magnesium hydroxide (MILK OF MAGNESIA) suspension 30 mL   traZODone (DESYREL) tablet 100 mg   hydrOXYzine (ATARAX) tablet 25 mg   diphenhydrAMINE (BENADRYL) capsule 50 mg   Or   diphenhydrAMINE (BENADRYL) injection 50 mg   LORazepam (ATIVAN) tablet 1 mg   OLANZapine (ZYPREXA) injection 5 mg   OLANZapine zydis (ZYPREXA) disintegrating tablet 5 mg   OLANZapine zydis (ZYPREXA) disintegrating tablet 15 mg   witch hazel-glycerin (TUCKS) pad   [COMPLETED]  OLANZapine zydis (ZYPREXA) disintegrating tablet 5 mg   Or   [COMPLETED] OLANZapine (ZYPREXA) injection 5 mg   sodium chloride (OCEAN) 0.65 % nasal spray 1 spray       11/05/2023   12:15 PM 08/25/2023    2:53 PM 05/20/2023    1:06 PM  Depression screen PHQ 2/9  Decreased Interest 0 0 1  Down, Depressed, Hopeless 1 0 0  PHQ - 2 Score 1 0 1  Altered sleeping 0 0 1  Tired, decreased energy 1 0 0  Change in appetite 1 0 0  Feeling bad or failure about yourself  0 0 0  Trouble concentrating 0 0 0  Moving slowly or fidgety/restless 0 0 0  Suicidal thoughts 0 0 0  PHQ-9 Score 3 0 2  Difficult doing work/chores Not difficult at all Not difficult at all Not difficult at all    Flowsheet Row ED from 12/04/2023 in [facility] (Dis[facility]) [facility] 07[facility]02/2022 in [facility]  C-SSRS RISK CATEGORY No  Risk No Risk Error: Question 6 not populated       Musculoskeletal  Strength & Muscle Tone: within normal limits Gait & Station: normal Patient leans: N/A  Psychiatric Specialty Exam  Presentation  General Appearance:  Disheveled  Eye Contact: Minimal  Speech: Slow  Speech Volume: Decreased  Handedness: Right   Mood and Affect  Mood: Anxious  Affect: Labile   Thought Process  Thought Processes: Disorganized; Irrevelant  Descriptions of Associations:Tangential  Orientation:Partial  Thought Content:Tangential  Diagnosis of Schizophrenia or Schizoaffective disorder in past: Yes  Duration of Psychotic Symptoms: Greater than six months   Hallucinations:UTA  Ideas of Reference:Other (comment) (UTA)  Suicidal Thoughts:Suicidal Thoughts: No  Homicidal Thoughts:Homicidal Thoughts: No   Sensorium  Memory: Immediate Fair; Recent Poor; Remote Poor  Judgment: Poor  Insight: Poor   Executive Functions   Concentration: Poor  Attention Span: Poor  Recall: Poor  Fund of Knowledge: Fair  Language: Fair   Psychomotor Activity  Psychomotor Activity: Psychomotor Activity: Restlessness   Assets  Assets: Social Support; Physical Health   Sleep  Sleep: Sleep: Poor  Physical Exam  Physical Exam Pulmonary:     Effort: Pulmonary effort is normal.  Musculoskeletal:        General: Normal range of motion.     Cervical back: Normal range of motion.  Neurological:     Mental Status: He is alert. He is disoriented.    Review of Systems  Constitutional: Negative.   HENT: Negative.    Eyes: Negative.   Respiratory: Negative.    Cardiovascular: Negative.   Musculoskeletal: Negative.   Neurological: Negative.    Blood pressure 136/88, pulse 80, temperature 98.3 F (36.8 C), temperature source Oral, resp. rate 18, SpO2 100%. There is no height or weight on file to calculate BMI.   Plan Of Care/Follow-up recommendations:    Disposition: Patient accepted to [facility]. Patient is under IVC.  Yajaira Doffing L, NP 12/06/2023, 10:22 AM

## 2023-12-06 NOTE — Progress Notes (Signed)
BHH/BMU LCSW Progress Note   12/06/2023    10:09 AM  Lucas Knapp   [id number]   Type of Contact and Topic:  Psychiatric Bed Placement   Pt accepted to [facility] 506-1    Patient meets inpatient criteria per Liborio Nixon, NP    The attending provider will be Dr. Abbott Pao   Call report to [phone]    Fortunato Curling, LPN @ [facility] notified.     Pt scheduled  to arrive at [facility] TODAY.    Damita Dunnings, MSW, LCSW-A  10:10 AM 12/06/2023

## 2023-12-06 NOTE — ED Notes (Signed)
Denies intent to harm self/others when asked. Denies VH. Reports AH "noises in my head all the time". Patient denies any physical complaints when asked. No acute distress noted. Support and encouragement provided. Routine safety checks conducted according to facility protocol. Encouraged patient to notify staff if thoughts of harm toward self or others arise. Endorses safety. Patient verbalized understanding and agreement. Will continue to monitor for safety.

## 2023-12-06 NOTE — ED Notes (Signed)
Patient sitting on recliner talking to himself. Fluids given to patient. Respirations equal and unlabored, skin warm and dry, NAD. Routine safety checks conducted according to facility protocol. Will continue to monitor for safety.

## 2023-12-07 ENCOUNTER — Encounter (HOSPITAL_COMMUNITY): Payer: Self-pay

## 2023-12-07 MED ORDER — PALIPERIDONE ER 3 MG PO TB24
3.0000 mg | ORAL_TABLET | Freq: Every day | ORAL | Status: DC
  Administered 2023-12-07: 3 mg via ORAL
  Filled 2023-12-07 (×2): qty 1

## 2023-12-07 NOTE — Group Note (Signed)
LCSW Group Therapy Note   Group Date: 12/07/2023 Start Time: 1315 End Time: 1405  Type of Therapy and Topic:  Group Therapy - Who Am I?  Participation Level:  Active   Description of Group The focus of this group was to aid patients in self-exploration and awareness. Patients were guided in exploring various factors of oneself to include interests, readiness to change, management of emotions, and individual perception of self. Patients were provided with complementary worksheets exploring hidden talents, ease of asking other for help, music/media preferences, understanding and responding to feelings/emotions, and hope for the future. At group closing, patients were encouraged to adhere to discharge plan to assist in continued self-exploration and understanding.  Therapeutic Goals Patients learned that self-exploration and awareness is an ongoing process Patients identified their individual skills, preferences, and abilities Patients explored their openness to establish and confide in supports Patients explored their readiness for change and progression of mental health   Summary of Patient Progress:  Patient actively engaged in introductory check-in. Patient actively engaged in activity of self-exploration and identification, completing complementary worksheet to assist in discussion. Patient identified various factors ranging from hidden talents, favorite music and movies, trusted individuals, accountability, and individual perceptions of self and hope.Pt engaged in processing thoughts and feelings as well as means of reframing thoughts. Pt proved receptive of alternate group members input and feedback from CSW.    Mykai Wendorf, Candace Cruise, LCSW

## 2023-12-07 NOTE — Plan of Care (Signed)
  Problem: Education: Goal: Emotional status will improve Outcome: Not Progressing Goal: Mental status will improve Outcome: Not Progressing Goal: Verbalization of understanding the information provided will improve Outcome: Not Progressing  Patient in room hitting the wall with cup and papers stated "I am trying to hang the picture on the wall. Patient is sleeping and waking up continues to be disorganized and responding to internal stimuli though not loud this moment. Support provided Patient redirected to go back to sleep.

## 2023-12-07 NOTE — BHH Suicide Risk Assessment (Signed)
B[facility] INPATIENT:  Family/Significant Other Suicide Prevention Education  Suicide Prevention Education:  Education Completed; Designer, fashion/clothing and Lucas Knapp mother  (name of family member/significant other) has been identified by the patient as the family member/significant other with whom the patient will be residing, and identified as the person(s) who will aid the patient in the event of a mental health crisis (suicidal ideations/suicide attempt).  With written consent from the patient, the family member/significant other has been provided the following suicide prevention education, prior to the and/or following the discharge of the patient.  CSW received consent to speak with pt's brother Pincus Sanes [phone]) and mother Lucas Knapp, who lives in [place], [place] [phone]) who basically shared the same information about pt. Pt has a twin brother who lives in an apt complex nearby. Twin brother also has a mental health diagnosis but is more controlled than pt. Peyton Najjar and [place] voiced no concerns surrounding pt's return home.  Parents went thru pt's home and found it was messy but no other concerns. Rhunette Croft reported that when pt is on his medication he can live by himself and functions well. Rhunette Croft reported pt has a part time job at a nearby gas station, they are aware of his limitations by way of his mental health diagnosis. There are no firearms in the home  and relatives are hoping pt can get a long acting injectable while at [facility].  The suicide prevention education provided includes the following: Suicide risk factors Suicide prevention and interventions National Suicide Hotline telephone number [facility] assessment telephone number [facility] Emergency Assistance 911 [facility] and/or Residential Mobile Crisis Unit telephone number  Request made of family/significant other to: Remove weapons (e.g., guns, rifles, knives), all items  previously/currently identified as safety concern.   Remove drugs/medications (over-the-counter, prescriptions, illicit drugs), all items previously/currently identified as a safety concern.  The family member/significant other verbalizes understanding of the suicide prevention education information provided.  The family member/significant other agrees to remove the items of safety concern listed above.  Rogene Houston 12/07/2023, 4:36 PM
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
## 2023-12-07 NOTE — Progress Notes (Signed)
Provider notified of continued loudness and responding to internal stimuli no new orders to give Zyprexa already ordered. Zyprexa IM given. Patient is loud and Tangential "electricity is in the room all these people have electricity" Patient compliant with medications support provided.

## 2023-12-07 NOTE — Group Note (Signed)
Recreation Therapy Group Note   Group Topic:Coping Skills  Group Date: 12/07/2023 Start Time: 1001 End Time: 1040 Facilitators: Daxten Kovalenko-McCall, LRT,CTRS Location: 500 Hall Dayroom   Group Topic: Stress Management   Goal Area(s) Addresses:  Patient will identify positive coping techniques. Patient will identify benefits of using positive copings skills post d/c.   Intervention: Worksheet, Group brain storming   Group Description:  Mind Map.  Patient was provided a blank template of a diagram with 32 blank boxes in a tiered system, branching from the center (similar to a bubble chart). LRT directed patients to label the middle of the diagram "Coping Skills" and consider 8 different instances (anger, stress, depression, fear, anxiety, lack of control, finances and time management) in which coping skills would be needed, that would be placed in the 2nd tier boxes. Patients were to then come up with 3 effective coping skills for the instances identified in 2nd tier of boxes. LRT then wrote the responses on the board for patients to fill in any blank spaces on their sheets.   Education: Stress Management, Discharge Planning.    Education Outcome: Acknowledges Education   Affect/Mood: Anxious   Participation Level: Engaged   Participation Quality: Independent   Behavior: Hyperverbal   Speech/Thought Process: Irrational and Relevant   Insight: Fair   Judgement: Fair    Modes of Intervention: Worksheet   Patient Response to Interventions:  Engaged   Education Outcome:  In group clarification offered    Clinical Observations/Individualized Feedback: Pt had moments of random laughter and speech that didn't relate to the topic. Pt was able to define coping skills "as something that helps you deal with things". In between the random comments pt would make, he was able to ask relevant questions and identify some coping skills as smoke, face things head on, positive thoughts,  budget and music.    Plan: Continue to engage patient in RT group sessions 2-3x/week.   Mikayla Chiusano-McCall, LRT,CTRS 12/07/2023 2:17 PM

## 2023-12-07 NOTE — Progress Notes (Signed)
   12/07/23 0900  Psych Admission Type (Psych Patients Only)  Admission Status Involuntary  Psychosocial Assessment  Patient Complaints Anxiety;Confusion;Disorientation  Eye Contact Darting  Facial Expression Angry;Anxious  Affect Labile;Irritable  Speech Tangential;Rapid;Pressured  Interaction Minimal  Motor Activity Pacing  Appearance/Hygiene In scrubs  Behavior Characteristics Unwilling to participate  Mood Suspicious;Preoccupied  Thought Process  Coherency Disorganized;Tangential;Flight of ideas  Content Delusions;Preoccupation  Delusions Paranoid  Perception Hallucinations  Hallucination Auditory  Judgment Poor  Confusion Mild  Danger to Self  Current suicidal ideation? Denies  Agreement Not to Harm Self Yes  Danger to Others  Danger to Others None reported or observed
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
## 2023-12-07 NOTE — Plan of Care (Signed)
  Problem: Activity: Goal: Interest or engagement in activities will improve Outcome: Progressing   Problem: Safety: Goal: Periods of time without injury will increase Outcome: Progressing   Problem: Coping: Goal: Coping ability will improve Outcome: Not Progressing

## 2023-12-07 NOTE — BHH Group Notes (Signed)
Pt was encouraged but refused to participate in wrap up group

## 2023-12-07 NOTE — Progress Notes (Signed)
Patient c/o body itching. Patient has redness to his back and abdomen. No other symptoms noted. Provider noted Benadryl 25 mg PO given Patient is compliant. Support provided as needed will continue to monitor.

## 2023-12-08 MED ORDER — PALIPERIDONE ER 6 MG PO TB24
6.0000 mg | ORAL_TABLET | Freq: Every day | ORAL | Status: DC
  Administered 2023-12-08: 6 mg via ORAL
  Filled 2023-12-08 (×3): qty 1

## 2023-12-08 MED ORDER — PALIPERIDONE ER 6 MG PO TB24
9.0000 mg | ORAL_TABLET | Freq: Every day | ORAL | Status: DC
  Administered 2023-12-09 – 2023-12-10 (×2): 9 mg via ORAL
  Filled 2023-12-08 (×3): qty 1

## 2023-12-08 MED ORDER — AMLODIPINE BESYLATE 5 MG PO TABS
5.0000 mg | ORAL_TABLET | Freq: Every day | ORAL | Status: DC
  Administered 2023-12-08 – 2023-12-09 (×2): 5 mg via ORAL
  Filled 2023-12-08 (×5): qty 1

## 2023-12-08 NOTE — Progress Notes (Addendum)
Patient given prn zyprexa 5mg  for irritability, and easily/frequently angered with the presence of particular staff member. Patient continued to pace around room. Verbalized seeing shadows in the window, and around the room, speaking on the telephone while he was in his room.   12/07/23 2013  Psych Admission Type (Psych Patients Only)  Admission Status Involuntary  Psychosocial Assessment  Patient Complaints Anxiety;Irritability;Suspiciousness  Eye Contact Brief;Darting  Facial Expression Anxious;Angry  Affect Irritable  Speech Pressured;Rapid;Tangential  Interaction Minimal  Motor Activity Other (Comment) (WDL)  Appearance/Hygiene Other (Comment);In scrubs (undressed)  Behavior Characteristics Unwilling to participate;Agressive verbally  Mood Suspicious;Preoccupied  Thought Process  Coherency Disorganized;Tangential  Content Preoccupation;Delusions  Delusions Paranoid  Perception WDL  Hallucination None reported or observed  Judgment Poor  Confusion Mild  Danger to Self  Current suicidal ideation? Denies  Agreement Not to Harm Self Yes  Description of Agreement verbal  Danger to Others  Danger to Others None reported or observed

## 2023-12-08 NOTE — Plan of Care (Addendum)
Problem: Activity: Goal: Interest or engagement in activities will improve Outcome: Progressing   Problem: Nutritional: Goal: Ability to achieve adequate nutritional intake will improve Outcome: Progressing   Problem: Safety: Goal: Ability to redirect hostility and anger into socially appropriate behaviors will improve Outcome: Progressing  Pt noted in milieu for majority of this shift. Observed to be animated, tangential, pressure, confused, preoccupied, delusional and grandeur in nature. Pt believes he owns lots of valuables and people are after him. However, he denies +AVH when assessed "I saw shadow when I was at home before I came in here". Pt's BP has been high this shift after multiple rechecks. Last reading was 142/98 HR 109 after scheduled Norvasc 5 mg prior to dinner. He remains medication compliant, attended scheduled groups, went off unit for meals; returned without issues. Safety checks maintained at Q 15 minutes intervals without incident.
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
## 2023-12-08 NOTE — BHH Group Notes (Signed)
Adult Psychoeducational Group Note  Date:  12/08/2023 Time:  4:25 PM  Group Topic/Focus:  Goals Group:   The focus of this group is to help patients establish daily goals to achieve during treatment and discuss how the patient can incorporate goal setting into their daily lives to aide in recovery. Orientation:   The focus of this group is to educate the patient on the purpose and policies of crisis stabilization and provide a format to answer questions about their admission.  The group details unit policies and expectations of patients while admitted.  Participation Level:  Active  Participation Quality:  Appropriate and Attentive  Affect:  Appropriate  Cognitive:  Appropriate  Insight: Appropriate  Engagement in Group:  Engaged  Modes of Intervention:  Discussion  Additional Comments:  Pt attended the goals group and remained appropriate and engaged throughout the duration of the group.   Sheran Lawless 12/08/2023, 4:25 PM

## 2023-12-08 NOTE — Group Note (Signed)
Recreation Therapy Group Note   Group Topic:Health and Wellness  Group Date: 12/08/2023 Start Time: 1044 End Time: 1120 Facilitators: Deaven Barron-McCall, LRT,CTRS Location: [address] Dayroom   Group Topic: Wellness  Goal Area(s) Addresses:  Patient will define components of whole wellness. Patient will verbalize benefit of whole wellness.  Group Description: Exercise. LRT discussed physical health with patients and the group activity. Patients were instructed they would take turns leading the group in the exercises of their choosing. Patients were instructed to take breaks and get water as needed. LRT provided the music for the wellness activity.    Education: Wellness, Building control surveyor.   Education Outcome: Acknowledges education/In group clarification offered/Needs additional education.    Affect/Mood: Labile   Participation Level: Active   Participation Quality: Moderate Cues   Behavior: Cooperative and Impulsive   Speech/Thought Process: Irrational   Insight: Poor   Judgement: Poor   Modes of Intervention: Music   Patient Response to Interventions:  Receptive   Education Outcome:  In group clarification offered    Clinical Observations/Individualized Feedback: Pt participated in activity. Pt needed redirection a few times focus on the activity and when he would ramble on off topic. Pt had a hard time staying on task without redirection. Pt was called out of group to meet with social worker but later returned.    Plan: Continue to engage patient in RT group sessions 2-3x/week.   Gaynor Genco-McCall, LRT,CTRS 12/08/2023 12:52 PM
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
## 2023-12-08 NOTE — Group Note (Signed)
Date:  12/08/2023 Time:  10:40 PM  Group Topic/Focus:  Wrap-Up Group:   The focus of this group is to help patients review their daily goal of treatment and discuss progress on daily workbooks.    Participation Level:  Active  Participation Quality:  Appropriate  Affect:  Appropriate  Cognitive:  Appropriate  Insight: Appropriate  Engagement in Group:  Developing/Improving  Modes of Intervention:  Discussion  Additional Comments:  Pt stated his goal for today was to focus on his treatment plan and discuss his medication issues with his treatment team. Pt stated he accomplished his goals today. Pt stated he talked with his doctor and social worker about his care today. Pt rated his overall day a 8 out of 10. Pt stated he was able to contact his mother and his brother today which improved his over day. Pt stated he felt better about himself today. Pt stated he was able to attend all meals. Pt stated he took all medications provided today. Pt stated he attend all groups held today. Pt stated his appetite was pretty good today. Pt rated sleep last night was poor. Pt stated the goal tonight was to get some rest. Pt stated he had no physical pain tonight. Pt deny visual hallucinations and auditory issues tonight. Pt denies thoughts of harming himself or others. Pt stated he would alert staff if anything changed.  Felipa Furnace 12/08/2023, 10:40 PM

## 2023-12-09 ENCOUNTER — Other Ambulatory Visit (HOSPITAL_COMMUNITY): Payer: Self-pay | Admitting: Psychiatry

## 2023-12-09 DIAGNOSIS — F200 Paranoid schizophrenia: Secondary | ICD-10-CM

## 2023-12-09 MED ORDER — AMLODIPINE BESYLATE 5 MG PO TABS
5.0000 mg | ORAL_TABLET | Freq: Once | ORAL | Status: AC
  Administered 2023-12-09: 5 mg via ORAL
  Filled 2023-12-09: qty 1

## 2023-12-09 MED ORDER — IBUPROFEN 200 MG PO TABS: 200.0000 mg | ORAL_TABLET | ORAL | Status: DC | PRN

## 2023-12-09 MED ORDER — AMLODIPINE BESYLATE 10 MG PO TABS
10.0000 mg | ORAL_TABLET | Freq: Every day | ORAL | Status: DC
  Administered 2023-12-10 – 2023-12-13 (×4): 10 mg via ORAL
  Filled 2023-12-09 (×6): qty 1

## 2023-12-09 NOTE — Group Note (Signed)
Date:  12/09/2023 Time:  9:10 PM  Group Topic/Focus:  Wrap-Up Group:   The focus of this group is to help patients review their daily goal of treatment and discuss progress on daily workbooks.    Participation Level:  Did Not Attend  Participation Quality:   Did Not Attend  Affect:   Did Not Attend  Cognitive:   Did Not Attend  Insight: None  Engagement in Group:   Did Not Attend  Modes of Intervention:   Did Not Attend   Additional Comments:   Pt was encouraged to attend wrap up group but did not attend Lucas Knapp 12/09/2023, 9:10 PM

## 2023-12-09 NOTE — Plan of Care (Signed)
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
## 2023-12-09 NOTE — Progress Notes (Signed)
   12/08/23 2200  Psych Admission Type (Psych Patients Only)  Admission Status Involuntary  Psychosocial Assessment  Patient Complaints Anxiety;Restlessness  Eye Contact Darting  Facial Expression Animated  Affect Appropriate to circumstance  Speech Rapid;Pressured  Interaction Demanding  Motor Activity Restless;Rigidity  Appearance/Hygiene Unremarkable  Behavior Characteristics Cooperative  Mood Anxious  Thought Process  Coherency Disorganized;Tangential  Content Blaming others  Delusions Paranoid  Perception Hallucinations  Hallucination Auditory  Judgment Poor  Confusion None  Danger to Self  Current suicidal ideation? Denies  Agreement Not to Harm Self Yes  Description of Agreement verbal  Danger to Others  Danger to Others None reported or observed

## 2023-12-09 NOTE — Progress Notes (Signed)
   12/09/23 2000  Psych Admission Type (Psych Patients Only)  Admission Status Involuntary  Psychosocial Assessment  Patient Complaints Restlessness  Eye Contact Brief  Facial Expression Animated  Affect Appropriate to circumstance  Speech Rapid;Pressured  Interaction Minimal  Motor Activity Restless  Appearance/Hygiene Disheveled  Behavior Characteristics Cooperative  Mood Anxious  Thought Process  Coherency Disorganized  Content Blaming others  Delusions Paranoid  Perception Hallucinations  Hallucination Auditory  Judgment Poor  Confusion None  Danger to Self  Current suicidal ideation? Denies  Agreement Not to Harm Self Yes  Description of Agreement Verbal  Danger to Others  Danger to Others None reported or observed

## 2023-12-09 NOTE — Group Note (Signed)
Recreation Therapy Group Note   Group Topic:Self-Esteem  Group Date: 12/09/2023 Start Time: 1010 End Time: 1055 Facilitators: Gara Kincade-McCall, LRT,CTRS Location: [address] Dayroom   Group Topic: Self-esteem  Goal Area(s) Addresses:  Patient will identify and write at least one positive trait about themself. Patient will acknowledge the benefit of healthy self-esteem. Patient will endorse understanding of ways to increase self-esteem.   Intervention: Personalized Plate- printed license plate template, markers or colored pencils   Group Description: LRT began group session with open dialogue asking the patients to define self-esteem and verbally identify positive qualities and traits people may possess. Patients were then instructed to design a personalized license plate, with words and drawings, representing at least 3 positive things about themselves. Pts were encouraged to include favorites, things they are proud of, what they enjoy doing, and dreams for their future. If a patient had a life motto or a meaningful phase that expressed their life values, pt's were asked to incorporate that into their design as well. Patients were given the opportunity to share their completed work with the group.  Education: Healthy self-esteem, Positive character traits, Accepting compliments, Leisure as competence and coping, Support Systems, Discharge planning LRT educated patients on the importance of healthy self-esteem and ways to build self-esteem.   Education Outcome: Acknowledges education/In group clarification offered   Affect/Mood: Euphoric   Participation Level: Active   Participation Quality: Moderate Cues   Behavior: Bizarre   Speech/Thought Process: Irrational and Incoherent   Insight: Lacking   Judgement: Lacking    Modes of Intervention: Art   Patient Response to Interventions:  Receptive   Education Outcome:  In group clarification offered    Clinical  Observations/Individualized Feedback: Pt had bizarre behavior exhibited by random laughing, talking to himself and irrational speech. Pt made no sense when he spoke. Pt needed redirection to focus in completing his license plate. Pt wrote "Cloretta Ned way I'm going bad word you up" which made no sense. Pt was hyper-verbal and hard to understand.     Plan: Continue to engage patient in RT group sessions 2-3x/week.   Lessie Funderburke-McCall, LRT,CTRS  12/09/2023 12:15 PM

## 2023-12-09 NOTE — Progress Notes (Signed)
Patient seen pacing back and forth in the hallway, patient was upset that the techs was coming into his room. Patient educated about the Q15 min safety checks that are mandatory. Patient fixated on techs coming in and out of his room. Patient voice started to get elevated. RN attempted to deescalate the situation. Support and encouragement provided. Patient continued to yelled about "someone is out to get me." RN redirected. PRN given.

## 2023-12-09 NOTE — Plan of Care (Signed)
°  Problem: Education: °Goal: Emotional status will improve °Outcome: Progressing °Goal: Mental status will improve °Outcome: Progressing °Goal: Verbalization of understanding the information provided will improve °Outcome: Progressing °  °

## 2023-12-10 MED ORDER — PALIPERIDONE PALMITATE ER 234 MG/1.5ML IM SUSY: 234.0000 mg | PREFILLED_SYRINGE | Freq: Once | INTRAMUSCULAR | Status: DC

## 2023-12-10 MED ORDER — PALIPERIDONE ER 6 MG PO TB24
6.0000 mg | ORAL_TABLET | Freq: Every day | ORAL | Status: DC
  Filled 2023-12-10 (×2): qty 1

## 2023-12-10 MED ORDER — QUETIAPINE FUMARATE 50 MG PO TABS
50.0000 mg | ORAL_TABLET | Freq: Every day | ORAL | Status: DC
  Administered 2023-12-10 – 2023-12-11 (×2): 50 mg via ORAL
  Filled 2023-12-10 (×5): qty 1

## 2023-12-10 MED ORDER — PALIPERIDONE PALMITATE ER 117 MG/0.75ML IM SUSY: 234.0000 mg | PREFILLED_SYRINGE | Freq: Once | INTRAMUSCULAR | Status: DC

## 2023-12-10 NOTE — [facility] Group Notes (Signed)
Pt attended nutrition group. 

## 2023-12-10 NOTE — Progress Notes (Signed)
   12/10/23 1500  Psych Admission Type (Psych Patients Only)  Admission Status Involuntary  Psychosocial Assessment  Patient Complaints Hyperactivity  Eye Contact Brief  Facial Expression Animated  Affect Appropriate to circumstance  Speech Rapid;Pressured  Interaction Minimal  Motor Activity Restless  Appearance/Hygiene Unremarkable  Behavior Characteristics Cooperative  Mood Anxious  Thought Process  Coherency Disorganized  Content Blaming others  Delusions Paranoid  Perception Hallucinations  Hallucination Auditory  Judgment Poor  Confusion None  Danger to Self  Current suicidal ideation? Denies  Danger to Others  Danger to Others None reported or observed
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
## 2023-12-10 NOTE — [facility] Group Notes (Signed)
Pt was invited but did not attend [employer] Group facilitated by Tammy.

## 2023-12-10 NOTE — Group Note (Signed)
Date:  12/10/2023 Time:  8:52 PM  Group Topic/Focus:  Wrap-Up Group:   The focus of this group is to help patients review their daily goal of treatment and discuss progress on daily workbooks.    Participation Level:  Active  Participation Quality:  Intrusive and Sharing  Affect:  Defensive  Cognitive:  Disorganized  Insight: Limited  Engagement in Group:  Limited and Off Topic  Modes of Intervention:  Discussion and Socialization  Additional Comments:  Patient completed and use wrap-up sheet as an guide. Patient was off topic when sharing and did not answer of any question on the wrap-up group sheet.   Lucas Knapp 12/10/2023, 8:52 PM

## 2023-12-10 NOTE — Group Note (Signed)
Recreation Therapy Group Note   Group Topic:Leisure Education  Group Date: 12/10/2023 Start Time: 1005 End Time: 1035 Facilitators: Traye Bates-McCall, LRT,CTRS Location: [address] Dayroom   Group Topic: Leisure Education   Goal Area(s) Addresses:  Patient will successfully demonstrate knowledge of leisure and recreation interests. Patient will successfully identify benefits of leisure participation.  Patient will verbalize appropriate recreation activities to use post discharge.   Intervention: Music Trivia   Group Description: LRT facilitated a competitive group game of 90s/2000s music trivia. Patients were asked to answer trivia questions about music artist and lyrics. The person with the most points at the end of the game won.   Education:  Leisure Programme researcher, broadcasting/film/video, Publishing copy Outcome: Acknowledges education   Affect/Mood: Labile   Participation Level: Engaged and Hyperverbal   Participation Quality: Maximum Cues   Behavior: Disruptive and Hyperverbal   Speech/Thought Process: Coherent, Disorganized, and Incoherent   Insight: Limited   Judgement: Limited   Modes of Intervention: Competitive Play   Patient Response to Interventions:  Engaged   Education Outcome:  In group clarification offered    Clinical Observations/Individualized Feedback: Pt was engaged during group. Pt was also very talkative. Pt constantly laughed while he was talking, which made it hard to understand him at times. Pt also needed a lot of redirection to refocus on the activity. Despite the random laughter and incoherent speech, pt was giving correct answers when he was able to be understood but would then revert back to random laughing.    Plan: Continue to engage patient in RT group sessions 2-3x/week.   Llewellyn Choplin-McCall, LRT,CTRS 12/10/2023 12:56 PM

## 2023-12-10 NOTE — [facility] Group Notes (Signed)
Adult Psychoeducational Group Note  Date:  12/10/2023 Time:  10:56 AM  Group Topic/Focus:  Goals Group:   The focus of this group is to help patients establish daily goals to achieve during treatment and discuss how the patient can incorporate goal setting into their daily lives to aide in recovery.  Participation Level:  Active  Participation Quality:  Appropriate  Affect:  Appropriate  Cognitive:  Alert  Insight: Appropriate  Engagement in Group:  Engaged  Modes of Intervention:  Orientation  Additional Comments:  Pt goal is to have positive energy and pure love.  Dellia Nims 12/10/2023, 10:56 AM

## 2023-12-10 NOTE — Plan of Care (Signed)
  Problem: Education: Goal: Emotional status will improve Outcome: Not Progressing Goal: Mental status will improve Outcome: Not Progressing   

## 2023-12-10 NOTE — Group Note (Signed)
Occupational Therapy Group Note  Group Topic:Coping Skills  Group Date: 12/10/2023 Start Time: 1430 End Time: 1500 Facilitators: Ted Mcalpine, OT   Group Description: Group encouraged increased engagement and participation through discussion and activity focused on "Coping Ahead." Patients were split up into teams and selected a card from a stack of positive coping strategies. Patients were instructed to act out/charade the coping skill for other peers to guess and receive points for their team. Discussion followed with a focus on identifying additional positive coping strategies and patients shared how they were going to cope ahead over the weekend while continuing hospitalization stay.  Therapeutic Goal(s): Identify positive vs negative coping strategies. Identify coping skills to be used during hospitalization vs coping skills outside of hospital/at home Increase participation in therapeutic group environment and promote engagement in treatment   Participation Level: Engaged   Participation Quality: Independent   Behavior: Appropriate   Speech/Thought Process: Loose association    Affect/Mood: Flat   Insight: Limited   Judgement: Limited      Modes of Intervention: Education  Patient Response to Interventions:  Attentive   Plan: Continue to engage patient in OT groups 2 - 3x/week.  12/10/2023  Ted Mcalpine, OT  Kerrin Champagne, OT

## 2023-12-10 NOTE — Progress Notes (Signed)
   12/10/23 2200  Psych Admission Type (Psych Patients Only)  Admission Status Involuntary  Psychosocial Assessment  Patient Complaints Hyperactivity;Insomnia  Eye Contact Fair  Facial Expression Animated;Anxious  Affect Anxious  Speech Pressured;Rapid  Interaction Assertive  Motor Activity Hyperactive  Appearance/Hygiene Unremarkable  Behavior Characteristics Cooperative;Anxious  Mood Anxious;Pleasant  Thought Process  Coherency Disorganized  Content Blaming others  Delusions Paranoid  Perception Hallucinations  Hallucination Auditory  Judgment Poor  Confusion None  Danger to Self  Current suicidal ideation? Denies  Agreement Not to Harm Self Yes  Description of Agreement Verbal  Danger to Others  Danger to Others None reported or observed

## 2023-12-11 ENCOUNTER — Encounter (HOSPITAL_COMMUNITY): Payer: Self-pay

## 2023-12-11 DIAGNOSIS — F200 Paranoid schizophrenia: Secondary | ICD-10-CM

## 2023-12-11 MED ORDER — PALIPERIDONE ER 6 MG PO TB24
6.0000 mg | ORAL_TABLET | Freq: Every day | ORAL | Status: DC
  Filled 2023-12-11: qty 1

## 2023-12-11 MED ORDER — PALIPERIDONE ER 6 MG PO TB24
12.0000 mg | ORAL_TABLET | Freq: Every day | ORAL | Status: DC
  Administered 2023-12-11 – 2023-12-18 (×8): 12 mg via ORAL
  Filled 2023-12-11 (×10): qty 2

## 2023-12-11 NOTE — Care Management Important Message (Signed)
Medicare IM printed for Derrell Lolling, LCSW to give to the patient.
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
## 2023-12-11 NOTE — Progress Notes (Signed)
Patient rated his depression level 0/10 and his anxiety level 0/10 with 10 being the highest and 0 none. Pt identified his goal for today as," Get my discharge papers so I can get out to go to work to pay my bills because ain't nobody going to pay them but me can I leave please". Medication and group compliant. Pt observed interacting well with peers. Appetite good on shift. Safety maintained.  12/11/23 0900  Psych Admission Type (Psych Patients Only)  Admission Status Involuntary  Psychosocial Assessment  Patient Complaints Hyperactivity  Eye Contact Fair  Facial Expression Anxious;Animated  Affect Anxious  Speech Pressured;Rapid  Interaction Assertive  Motor Activity Restless  Appearance/Hygiene Unremarkable  Behavior Characteristics Cooperative;Anxious  Mood Anxious;Pleasant  Thought Process  Coherency Disorganized;Tangential  Content Blaming others  Delusions Paranoid  Perception Hallucinations (Pt observed talking to himself as if responding)  Hallucination Auditory  Judgment Poor  Confusion None  Danger to Self  Current suicidal ideation? Denies  Agreement Not to Harm Self Yes  Description of Agreement Verbal  Danger to Others  Danger to Others None reported or observed

## 2023-12-11 NOTE — [facility] Group Notes (Signed)
[facility] Group Notes:  (Nursing/MHT/Case Management/Adjunct)  Date:  12/11/2023  Time:  6:28 PMAdult Psychoeducational Group Note  Date:  12/11/2023 Time:  6:28 PM  Group Topic/Focus:  Goals Group:   The focus of this group is to help patients establish daily goals to achieve during treatment and discuss how the patient can incorporate goal setting into their daily lives to aide in recovery. Orientation:   The focus of this group is to educate the patient on the purpose and policies of crisis stabilization and provide a format to answer questions about their admission.  The group details unit policies and expectations of patients while admitted.  Participation Level:  Active  Participation Quality:  Appropriate  Affect:  Appropriate  Cognitive:  Appropriate  Insight: Good  Engagement in Group:  Engaged  Modes of Intervention:  Discussion  Additional Comments:  Pt goal is to write a book.  Lucilla Edin 12/11/2023, 6:28 PM

## 2023-12-11 NOTE — Group Note (Signed)
Recreation Therapy Group Note   Group Topic:General Recreation  Group Date: 12/11/2023 Start Time: 1026 End Time: 1110 Facilitators: Lucas Knapp, LRT,CTRS Location: [address] Dayroom   Group Topic/Focus: General Recreation   Goal Area(s) Addresses:  Patient will use appropriate interactions in play with peers.   Patient will exhibit appropriate behavior during group session.  Intervention: Play and Exercise  Group Description: Keep it Going Volleyball. Patients were placed in a circle and were instructed to hit the volleyball to each other in any direction they chose. Patients were to keep the ball in rotation without it coming to a stop. The ball could be bounced off the floor or roll just as long as it didn't come to a stop. At the same time, LRT would be keeping time of how long the ball stayed in rotation. If the ball came to a stop, the time would start over.    Affect/Mood: Elevated   Participation Level: Engaged and Hyperverbal   Participation Quality: Moderate Cues   Behavior: Hyperverbal   Speech/Thought Process: Disorganized   Insight: Limited   Judgement: Limited   Modes of Intervention: Cooperative Play   Patient Response to Interventions:  Engaged   Education Outcome:  In group clarification offered    Clinical Observations/Individualized Feedback: Pt was late to group jumped right into the activity. Pt was able to follow along with the instructions of the activity. Pt was extremely talkative. Pt would talk about things that were relevant to the activity but would veer off to subjects that had no relevance and made little sense. Pt was bright throughout activity.      Plan: Continue to engage patient in RT group sessions 2-3x/week.   Lucas Knapp, LRT, CTRS 12/11/2023 1:14 PM
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
## 2023-12-11 NOTE — BHH Group Notes (Signed)
Spirituality group facilitated by Kathleen Argue, BCC.  Group Description: Group focused on topic of hope. Patients participated in facilitated discussion around topic, connecting with one another around experiences and definitions for hope. Group members engaged with visual explorer photos, reflecting on what hope looks like for them today. Group engaged in discussion around how their definitions of hope are present today in hospital.  Modalities: Psycho-social ed, Adlerian, Narrative, MI  Patient Progress: Lucas Knapp came in partway through group and began monopolizing group conversation.  His thoughts were difficult to follow.

## 2023-12-11 NOTE — Plan of Care (Signed)
  Problem: Education: Goal: Emotional status will improve Outcome: Progressing   Problem: Activity: Goal: Interest or engagement in activities will improve Outcome: Progressing Goal: Sleeping patterns will improve Outcome: Progressing   Problem: Coping: Goal: Ability to verbalize frustrations and anger appropriately will improve Outcome: Progressing Goal: Ability to demonstrate self-control will improve Outcome: Progressing   Problem: Safety: Goal: Ability to redirect hostility and anger into socially appropriate behaviors will improve Outcome: Progressing Goal: Ability to remain free from injury will improve Outcome: Progressing   Problem: Self-Care: Goal: Ability to participate in self-care as condition permits will improve Outcome: Progressing

## 2023-12-11 NOTE — Progress Notes (Signed)
   12/11/23 0557  15 Minute Checks  Location Bathroom/Shower  Visual Appearance Calm  Behavior Composed  Sleep (Behavioral Health Patients Only)  Calculate sleep? (Click Yes once per 24 hr at 0600 safety check) Yes  Documented sleep last 24 hours 7.25

## 2023-12-12 DIAGNOSIS — F200 Paranoid schizophrenia: Secondary | ICD-10-CM | POA: Diagnosis not present

## 2023-12-12 MED ORDER — PROPRANOLOL HCL 10 MG PO TABS
10.0000 mg | ORAL_TABLET | Freq: Two times a day (BID) | ORAL | Status: DC
  Administered 2023-12-12 – 2023-12-16 (×8): 10 mg via ORAL
  Filled 2023-12-12 (×13): qty 1

## 2023-12-12 MED ORDER — QUETIAPINE FUMARATE 200 MG PO TABS
200.0000 mg | ORAL_TABLET | Freq: Every day | ORAL | Status: DC
  Administered 2023-12-12 – 2023-12-13 (×2): 200 mg via ORAL
  Filled 2023-12-12 (×5): qty 1

## 2023-12-12 NOTE — Plan of Care (Signed)
  Problem: Coping: Goal: Coping ability will improve Outcome: Progressing   

## 2023-12-12 NOTE — Progress Notes (Signed)
   12/12/23 1945  Psych Admission Type (Psych Patients Only)  Admission Status Involuntary  Psychosocial Assessment  Patient Complaints Restlessness  Eye Contact Brief  Facial Expression Anxious;Animated  Affect Anxious  Speech Pressured;Tangential  Interaction Assertive  Motor Activity Fidgety  Appearance/Hygiene In hospital gown  Behavior Characteristics Cooperative;Appropriate to situation;Anxious  Mood Anxious;Pleasant  Thought Process  Coherency Disorganized;Flight of ideas;Tangential  Content Blaming others  Delusions Paranoid  Perception Hallucinations  Hallucination Auditory  Judgment Poor  Confusion Mild  Danger to Self  Current suicidal ideation? Denies

## 2023-12-12 NOTE — Group Note (Signed)
Date:  12/12/2023 Time:  8:28 PM  Group Topic/Focus:  Wrap-Up Group:   The focus of this group is to help patients review their daily goal of treatment and discuss progress on daily workbooks.    Participation Level:  Active  Participation Quality:  Appropriate  Affect:  Appropriate  Cognitive:  Appropriate  Insight: Appropriate  Engagement in Group:  Engaged  Modes of Intervention:  Education and Exploration  Additional Comments:  Patient attended and participated in group tonight. He reports that a friend call him today which made his day. It lifted his spirit.  Lita Mains [facility] 12/12/2023, 8:28 PM
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
## 2023-12-12 NOTE — Progress Notes (Signed)
   12/12/23 0900  Psych Admission Type (Psych Patients Only)  Admission Status Involuntary  Psychosocial Assessment  Patient Complaints Suspiciousness;Restlessness  Eye Contact Fair  Facial Expression Animated;Anxious  Affect Anxious  Speech Pressured;Tangential  Interaction Assertive  Motor Activity Fidgety  Appearance/Hygiene Unremarkable  Behavior Characteristics Cooperative;Appropriate to situation  Mood Anxious;Pleasant  Thought Process  Coherency Disorganized;Tangential  Content Blaming others  Delusions Paranoid  Perception Hallucinations  Hallucination Auditory  Judgment Poor  Confusion None  Danger to Self  Current suicidal ideation? Denies  Agreement Not to Harm Self Yes  Description of Agreement verbal  Danger to Others  Danger to Others None reported or observed
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
## 2023-12-12 NOTE — Plan of Care (Signed)
  Problem: Health Behavior/Discharge Planning: Goal: Compliance with prescribed medication regimen will improve Outcome: Progressing   Problem: Nutritional: Goal: Ability to achieve adequate nutritional intake will improve Outcome: Progressing   Problem: Role Relationship: Goal: Ability to communicate needs accurately will improve Outcome: Progressing   Problem: Safety: Goal: Ability to remain free from injury will improve Outcome: Progressing   Problem: Self-Concept: Goal: Will verbalize positive feelings about self Outcome: Progressing

## 2023-12-12 NOTE — Plan of Care (Signed)
  Problem: Education: Goal: Knowledge of [facility] Education information/materials will improve Outcome: Progressing Goal: Emotional status will improve Outcome: Progressing Goal: Mental status will improve Outcome: Progressing Goal: Verbalization of understanding the information provided will improve Outcome: Progressing   Problem: Activity: Goal: Interest or engagement in activities will improve Outcome: Progressing   Problem: Coping: Goal: Ability to demonstrate self-control will improve Outcome: Progressing   Problem: Safety: Goal: Periods of time without injury will increase Outcome: Progressing

## 2023-12-13 DIAGNOSIS — F200 Paranoid schizophrenia: Secondary | ICD-10-CM | POA: Diagnosis not present

## 2023-12-13 MED ORDER — AMLODIPINE BESYLATE 10 MG PO TABS
10.0000 mg | ORAL_TABLET | Freq: Every day | ORAL | Status: DC
Start: 2023-12-14 — End: 2023-12-31
  Administered 2023-12-14 – 2023-12-30 (×17): 10 mg via ORAL
  Filled 2023-12-13 (×19): qty 1

## 2023-12-13 NOTE — Plan of Care (Signed)
  Problem: Role Relationship: Goal: Ability to communicate needs accurately will improve Outcome: Progressing   Problem: Self-Concept: Goal: Will verbalize positive feelings about self Outcome: Progressing

## 2023-12-13 NOTE — Plan of Care (Signed)
  Problem: Education: Goal: Knowledge of [facility] Education information/materials will improve Outcome: Progressing Goal: Emotional status will improve Outcome: Progressing Goal: Mental status will improve Outcome: Progressing Goal: Verbalization of understanding the information provided will improve Outcome: Progressing   Problem: Activity: Goal: Interest or engagement in activities will improve Outcome: Progressing   Problem: Coping: Goal: Ability to verbalize frustrations and anger appropriately will improve Outcome: Progressing Goal: Ability to demonstrate self-control will improve Outcome: Progressing   Problem: Safety: Goal: Periods of time without injury will increase Outcome: Progressing

## 2023-12-13 NOTE — Group Note (Signed)
Date:  12/13/2023 Time:  9:19 PM  Group Topic/Focus:  Wrap-Up Group:   The focus of this group is to help patients review their daily goal of treatment and discuss progress on daily workbooks.    Participation Level:  Active  Participation Quality:  Appropriate  Affect:  Appropriate  Cognitive:  Appropriate  Insight: Appropriate  Engagement in Group:  Engaged  Modes of Intervention:  Education and Exploration  Additional Comments:  Patient attended and participated in group tonight. He reports that his goal today was to make his day a positive one.  Lita Mains [facility] 12/13/2023, 9:19 PM

## 2023-12-13 NOTE — Progress Notes (Signed)
   12/13/23 1000  Psych Admission Type (Psych Patients Only)  Admission Status Involuntary  Psychosocial Assessment  Patient Complaints Restlessness  Eye Contact Brief  Facial Expression Animated;Anxious  Affect Anxious  Speech Pressured;Tangential  Interaction Assertive  Motor Activity Fidgety  Appearance/Hygiene In hospital gown  Behavior Characteristics Cooperative;Appropriate to situation  Mood Anxious;Pleasant  Thought Process  Coherency Disorganized;Tangential  Content Blaming others  Delusions Paranoid  Perception Hallucinations  Hallucination Auditory  Judgment Poor  Confusion None  Danger to Self  Current suicidal ideation? Denies  Agreement Not to Harm Self Yes  Description of Agreement verbal  Danger to Others  Danger to Others None reported or observed

## 2023-12-13 NOTE — Progress Notes (Signed)
   12/13/23 2040  Psych Admission Type (Psych Patients Only)  Admission Status Involuntary  Psychosocial Assessment  Patient Complaints None  Eye Contact Brief  Facial Expression Anxious;Animated  Affect Anxious  Speech Pressured;Tangential  Interaction Assertive  Motor Activity Fidgety  Appearance/Hygiene Unremarkable  Behavior Characteristics Cooperative;Appropriate to situation  Mood Anxious;Pleasant  Thought Process  Coherency Disorganized;Flight of ideas;Tangential  Content Blaming others  Delusions Paranoid  Perception Hallucinations  Hallucination Auditory  Judgment Poor  Confusion Mild  Danger to Self  Current suicidal ideation? Denies
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
## 2023-12-14 DIAGNOSIS — F200 Paranoid schizophrenia: Secondary | ICD-10-CM | POA: Diagnosis not present

## 2023-12-14 LAB — COMPREHENSIVE METABOLIC PANEL
ALT: 180 U/L — ABNORMAL HIGH (ref 0–44)
AST: 127 U/L — ABNORMAL HIGH (ref 15–41)
Albumin: 3.9 g/dL (ref 3.5–5.0)
Alkaline Phosphatase: 60 U/L (ref 38–126)
Anion gap: 10 (ref 5–15)
BUN: 11 mg/dL (ref 6–20)
CO2: 25 mmol/L (ref 22–32)
Calcium: 9.3 mg/dL (ref 8.9–10.3)
Chloride: 100 mmol/L (ref 98–111)
Creatinine, Ser: 0.72 mg/dL (ref 0.61–1.24)
GFR, Estimated: >60 mL/min (ref >60)
Glucose, Bld: 89 mg/dL (ref 70–99)
Potassium: 4.2 mmol/L (ref 3.5–5.1)
Sodium: 135 mmol/L (ref 135–145)
Total Bilirubin: 0.6 mg/dL (ref <1.2)
Total Protein: 6.9 g/dL (ref 6.5–8.1)

## 2023-12-14 MED ORDER — QUETIAPINE FUMARATE 100 MG PO TABS
100.0000 mg | ORAL_TABLET | Freq: Every day | ORAL | Status: DC
  Administered 2023-12-14 – 2023-12-15 (×2): 100 mg via ORAL
  Filled 2023-12-14 (×4): qty 1

## 2023-12-14 MED ORDER — IBUPROFEN 400 MG PO TABS
400.0000 mg | ORAL_TABLET | ORAL | Status: DC | PRN
  Administered 2023-12-14 – 2023-12-22 (×5): 400 mg via ORAL
  Filled 2023-12-14 (×6): qty 1

## 2023-12-14 MED ORDER — OXCARBAZEPINE 300 MG PO TABS
300.0000 mg | ORAL_TABLET | Freq: Two times a day (BID) | ORAL | Status: DC
Start: 2023-12-14 — End: 2023-12-31
  Administered 2023-12-14 – 2023-12-31 (×34): 300 mg via ORAL
  Filled 2023-12-14 (×39): qty 1

## 2023-12-14 NOTE — Group Note (Signed)
LCSW Group Therapy Note   Group Date: 12/14/2023 Start Time: 1300 End Time: 1400 Topic:   Due to the acuity and complex discharge plans, group was not held. Patient was provided therapeutic worksheets and asked to meet with CSW as needed.   Rogene Houston, LCSWA 12/14/2023  2:40 PM

## 2023-12-14 NOTE — Group Note (Signed)
Recreation Therapy Group Note   Group Topic:Problem Solving  Group Date: 12/14/2023 Start Time: 1005 End Time: 1035 Facilitators: Augustina Braddock-McCall, LRT,CTRS Location: [address] Dayroom   Group Topic: Communication, Team Building, Problem Solving  Goal Area(s) Addresses:  Patient will effectively work with peer towards shared goal.  Patient will identify skills used to make activity successful.  Patient will identify how skills used during activity can be applied to reach post d/c goals.   Intervention: STEM Activity- Glass blower/designer  Group Description: Tallest Pharmacist, community. In teams of 5-6, patients were given 11 craft pipe cleaners. Using the materials provided, patients were instructed to compete again the opposing team(s) to build the tallest free-standing structure from floor level. The activity was timed; difficulty increased by Clinical research associate as Production designer, theatre/television/film continued.  Systematically resources were removed with additional directions for example, placing one arm behind their back, working in silence, and shape stipulations. LRT facilitated post-activity discussion reviewing team processes and necessary communication skills involved in completion. Patients were encouraged to reflect how the skills utilized, or not utilized, in this activity can be incorporated to positively impact support systems post discharge.  Education: Pharmacist, community, Scientist, physiological, Discharge Planning   Education Outcome: Acknowledges education/In group clarification offered/Needs additional education.    Affect/Mood: Manic   Participation Level: Hyperverbal   Participation Quality: Maximum Cues   Behavior: Disruptive and Hyperverbal   Speech/Thought Process: Disorganized   Insight: Limited   Judgement: Limited   Modes of Intervention: STEM Activity   Patient Response to Interventions:  Engaged   Education Outcome:  In group clarification offered    Clinical  Observations/Individualized Feedback: Pt was very hyper-verbal during activity. Pt needed constant redirection from rambling about things that had nothing to do with group and over talking peer. At times when pt was rambling, pt could come up with answers dealing with the topic when redirected by to the discussion. Pt did identify brainstorming as a skills used to complete the activity and one to be used with support system.     Plan: Continue to engage patient in RT group sessions 2-3x/week.   Lucas Knapp, LRT,CTRS 12/14/2023 12:37 PM

## 2023-12-14 NOTE — Group Note (Signed)
Date:  12/14/2023 Time:  9:35 PM  Group Topic/Focus:  Wrap-Up Group:   The focus of this group is to help patients review their daily goal of treatment and discuss progress on daily workbooks.    Participation Level:  Active  Participation Quality:  Appropriate  Affect:  Appropriate  Cognitive:  Appropriate  Insight: Appropriate  Engagement in Group:  Engaged  Modes of Intervention:  Education and Exploration  Additional Comments:  Patient attended and participated in group tonight. He reports that he take care of himself by going to work to earn money to pay his bills  Scot Dock 12/14/2023, 9:35 PM

## 2023-12-14 NOTE — Progress Notes (Signed)
   12/14/23 2100  Psych Admission Type (Psych Patients Only)  Admission Status Involuntary  Psychosocial Assessment  Patient Complaints None  Eye Contact Fair  Facial Expression Animated;Anxious  Affect Anxious  Speech Pressured;Rapid;Tangential  Interaction Assertive  Motor Activity Restless  Appearance/Hygiene Designer, industrial/product Cooperative;Appropriate to situation;Anxious  Mood Anxious;Pleasant  Thought Process  Coherency Disorganized;Flight of ideas;Tangential  Content Blaming others  Delusions Paranoid  Perception Hallucinations  Hallucination Auditory  Judgment Poor  Confusion Mild  Danger to Self  Current suicidal ideation? Denies  Agreement Not to Harm Self Yes  Description of Agreement Verbal  Danger to Others  Danger to Others None reported or observed
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
## 2023-12-14 NOTE — Clinical Note (Incomplete)
Patient currently denies SI, HI, and AVH this shift.

## 2023-12-15 DIAGNOSIS — F200 Paranoid schizophrenia: Secondary | ICD-10-CM | POA: Diagnosis not present

## 2023-12-15 NOTE — Progress Notes (Signed)
   12/15/23 0603  15 Minute Checks  Location Bedroom  Visual Appearance Calm  Behavior Composed  Sleep (Behavioral Health Patients Only)  Calculate sleep? (Click Yes once per 24 hr at 0600 safety check) Yes  Documented sleep last 24 hours 6

## 2023-12-15 NOTE — Progress Notes (Signed)
Patient denies SI, HI, and AVH this shift. Patient has been noted to be hyperverbal, tangential, and has flight of ideas.   Assess patient for safety, offer medications as prescribed, engage patient in 1:1 staff talks.   Patient able to contract for safety. Continue to monitor as planned.

## 2023-12-15 NOTE — Progress Notes (Signed)
   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 Denies
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
## 2023-12-16 ENCOUNTER — Encounter (HOSPITAL_COMMUNITY): Payer: Self-pay | Admitting: Behavioral Health

## 2023-12-16 ENCOUNTER — Encounter (HOSPITAL_COMMUNITY): Payer: Self-pay

## 2023-12-16 DIAGNOSIS — Z79899 Other long term (current) drug therapy: Secondary | ICD-10-CM

## 2023-12-16 DIAGNOSIS — F200 Paranoid schizophrenia: Secondary | ICD-10-CM | POA: Diagnosis not present

## 2023-12-16 HISTORY — DX: Other long term (current) drug therapy: Z79.899

## 2023-12-16 LAB — COMPREHENSIVE METABOLIC PANEL
ALT: 240 U/L — ABNORMAL HIGH (ref 0–44)
AST: 128 U/L — ABNORMAL HIGH (ref 15–41)
Albumin: 3.7 g/dL (ref 3.5–5.0)
Alkaline Phosphatase: 54 U/L (ref 38–126)
Anion gap: 8 (ref 5–15)
BUN: 11 mg/dL (ref 6–20)
CO2: 23 mmol/L (ref 22–32)
Calcium: 9.3 mg/dL (ref 8.9–10.3)
Chloride: 105 mmol/L (ref 98–111)
Creatinine, Ser: 0.8 mg/dL (ref 0.61–1.24)
GFR, Estimated: >60 mL/min (ref >60)
Glucose, Bld: 102 mg/dL — ABNORMAL HIGH (ref 70–99)
Potassium: 4.2 mmol/L (ref 3.5–5.1)
Sodium: 136 mmol/L (ref 135–145)
Total Bilirubin: 0.3 mg/dL (ref <1.2)
Total Protein: 6.6 g/dL (ref 6.5–8.1)

## 2023-12-16 LAB — HEPATITIS PANEL, ACUTE
HCV Ab: NONREACTIVE
Hep A IgM: NONREACTIVE
Hep B C IgM: NONREACTIVE
Hepatitis B Surface Ag: NONREACTIVE

## 2023-12-16 LAB — AMMONIA: Ammonia: 21 umol/L (ref 9–35)

## 2023-12-16 MED ORDER — PROPRANOLOL HCL 10 MG PO TABS
5.0000 mg | ORAL_TABLET | Freq: Two times a day (BID) | ORAL | Status: AC
  Administered 2023-12-16 – 2023-12-17 (×2): 5 mg via ORAL
  Filled 2023-12-16 (×2): qty 0.5

## 2023-12-16 MED ORDER — TEMAZEPAM 15 MG PO CAPS
15.0000 mg | ORAL_CAPSULE | Freq: Every evening | ORAL | Status: DC | PRN
  Administered 2023-12-16 – 2023-12-26 (×9): 15 mg via ORAL
  Filled 2023-12-16 (×12): qty 1

## 2023-12-16 NOTE — Progress Notes (Signed)
   12/16/23 2015  Psych Admission Type (Psych Patients Only)  Admission Status Involuntary  Psychosocial Assessment  Patient Complaints Anxiety  Eye Contact Fair  Facial Expression Anxious;Animated  Affect Anxious  Speech Pressured;Rhyming;Incoherent;Rapid  Interaction Assertive  Motor Activity Restless  Appearance/Hygiene Disheveled  Behavior Characteristics Cooperative  Mood Preoccupied;Pleasant  Aggressive Behavior  Effect No apparent injury  Thought Process  Coherency Disorganized;Loose associations;Circumstantial  Content Blaming others  Delusions Grandeur  Perception WDL  Hallucination None reported or observed  Judgment Poor  Confusion Mild  Danger to Self  Current suicidal ideation? Denies

## 2023-12-16 NOTE — Group Note (Signed)
Date:  12/16/2023 Time:  9:46 PM  Group Topic/Focus:  Wrap-Up Group:   The focus of this group is to help patients review their daily goal of treatment and discuss progress on daily workbooks.    Participation Level:  Active  Participation Quality:  Appropriate  Affect:  Appropriate  Cognitive:  Appropriate  Insight: Appropriate  Engagement in Group:  Engaged  Modes of Intervention:  Discussion  Additional Comments:  Patient rated his day 7 out of 10.  Patient stated he just feels happy and content   Alfonse Ras 12/16/2023, 9:46 PM

## 2023-12-16 NOTE — Plan of Care (Signed)
  Problem: Activity: Goal: Interest or engagement in activities will improve Outcome: Progressing

## 2023-12-16 NOTE — Hospital Course (Signed)
Ast/Alt - trazodone - propranolol - decreased clearance - zyprexa - seroquel  LFTs daily- daily until downtrending

## 2023-12-16 NOTE — Plan of Care (Signed)
Medicine consult consulted for uptrending AST/ALT, his thoughts and recs: - AST has not changed, ALT increased slightly - suspects it is one of the meds: - trazodone - propranolol - decreases clearance - zyprexa - seroquel Back track to see which fits timeline of worsening ALT/AST LFTs daily- daily until downtrending Acute hepatitis panel  Appreciate medicine consult for assistance and recs.

## 2023-12-16 NOTE — Progress Notes (Signed)
   12/16/23 1000  Psych Admission Type (Psych Patients Only)  Admission Status Involuntary  Psychosocial Assessment  Patient Complaints None  Eye Contact Fair  Facial Expression Animated;Anxious  Affect Anxious  Speech Loud;Rapid;Tangential  Interaction Assertive  Motor Activity Restless  Appearance/Hygiene In hospital gown;Disheveled  Behavior Characteristics Cooperative  Mood Pleasant  Thought Process  Coherency Disorganized;Flight of ideas;Tangential;Loose associations  Content Blaming others  Delusions Grandeur  Perception WDL  Hallucination None reported or observed  Judgment Poor  Confusion Mild  Danger to Self  Current suicidal ideation? Denies  Agreement Not to Harm Self Yes  Description of Agreement verbal  Danger to Others  Danger to Others None reported or observed
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
## 2023-12-16 NOTE — Plan of Care (Signed)
  Problem: Education: Goal: Emotional status will improve Outcome: Progressing   Problem: Activity: Goal: Interest or engagement in activities will improve Outcome: Progressing Goal: Sleeping patterns will improve Outcome: Progressing   Problem: Coping: Goal: Ability to demonstrate self-control will improve Outcome: Progressing   Problem: Safety: Goal: Periods of time without injury will increase Outcome: Progressing
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
## 2023-12-16 NOTE — B[facility] Group Notes (Signed)
Adult Psychoeducational Group Note  Date:  12/16/2023 Time:  11:40 AM  Group Topic/Focus:  Goals Group:   The focus of this group is to help patients establish daily goals to achieve during treatment and discuss how the patient can incorporate goal setting into their daily lives to aide in recovery.  Participation Level:  Active  Participation Quality:  Appropriate  Affect:  Appropriate  Cognitive:  Appropriate  Insight: Appropriate  Engagement in Group:  Improving  Modes of Intervention:  Discussion  Additional Comments: The patient engaged in group.  Octavio Manns 12/16/2023, 11:40 AM

## 2023-12-16 NOTE — Plan of Care (Signed)
  Problem: Activity: Goal: Interest or engagement in activities will improve Outcome: Progressing   Problem: Coping: Goal: Ability to demonstrate self-control will improve Outcome: Progressing   Problem: Safety: Goal: Periods of time without injury will increase Outcome: Progressing   Problem: Health Behavior/Discharge Planning: Goal: Compliance with prescribed medication regimen will improve Outcome: Progressing

## 2023-12-17 DIAGNOSIS — F250 Schizoaffective disorder, bipolar type: Secondary | ICD-10-CM | POA: Diagnosis not present

## 2023-12-17 LAB — COMPREHENSIVE METABOLIC PANEL
ALT: 260 U/L — ABNORMAL HIGH (ref 0–44)
AST: 127 U/L — ABNORMAL HIGH (ref 15–41)
Albumin: 4 g/dL (ref 3.5–5.0)
Alkaline Phosphatase: 59 U/L (ref 38–126)
Anion gap: 6 (ref 5–15)
BUN: 11 mg/dL (ref 6–20)
CO2: 27 mmol/L (ref 22–32)
Calcium: 9.3 mg/dL (ref 8.9–10.3)
Chloride: 101 mmol/L (ref 98–111)
Creatinine, Ser: 0.9 mg/dL (ref 0.61–1.24)
GFR, Estimated: >60 mL/min (ref >60)
Glucose, Bld: 97 mg/dL (ref 70–99)
Potassium: 4 mmol/L (ref 3.5–5.1)
Sodium: 134 mmol/L — ABNORMAL LOW (ref 135–145)
Total Bilirubin: 0.6 mg/dL (ref <1.2)
Total Protein: 7.2 g/dL (ref 6.5–8.1)

## 2023-12-17 NOTE — Plan of Care (Signed)
  Problem: Education: Goal: Knowledge of [facility] Education information/materials will improve Outcome: Progressing Goal: Emotional status will improve Outcome: Progressing Goal: Mental status will improve Outcome: Progressing Goal: Verbalization of understanding the information provided will improve Outcome: Progressing   Problem: Activity: Goal: Interest or engagement in activities will improve Outcome: Progressing Goal: Sleeping patterns will improve Outcome: Progressing   Problem: Coping: Goal: Ability to verbalize frustrations and anger appropriately will improve Outcome: Progressing Goal: Ability to demonstrate self-control will improve Outcome: Progressing   Problem: Health Behavior/Discharge Planning: Goal: Identification of resources available to assist in meeting health care needs will improve Outcome: Progressing Goal: Compliance with treatment plan for underlying cause of condition will improve Outcome: Progressing   Problem: Physical Regulation: Goal: Ability to maintain clinical measurements within normal limits will improve Outcome: Progressing   Problem: Safety: Goal: Periods of time without injury will increase Outcome: Progressing   Problem: Activity: Goal: Will verbalize the importance of balancing activity with adequate rest periods Outcome: Progressing   Problem: Education: Goal: Will be free of psychotic symptoms Outcome: Progressing Goal: Knowledge of the prescribed therapeutic regimen will improve Outcome: Progressing   Problem: Coping: Goal: Coping ability will improve Outcome: Progressing Goal: Will verbalize feelings Outcome: Progressing   Problem: Health Behavior/Discharge Planning: Goal: Compliance with prescribed medication regimen will improve Outcome: Progressing   Problem: Nutritional: Goal: Ability to achieve adequate nutritional intake will improve Outcome: Progressing   Problem: Role Relationship: Goal:  Ability to communicate needs accurately will improve Outcome: Progressing Goal: Ability to interact with others will improve Outcome: Progressing   Problem: Safety: Goal: Ability to redirect hostility and anger into socially appropriate behaviors will improve Outcome: Progressing Goal: Ability to remain free from injury will improve Outcome: Progressing   Problem: Self-Care: Goal: Ability to participate in self-care as condition permits will improve Outcome: Progressing   Problem: Self-Concept: Goal: Will verbalize positive feelings about self Outcome: Progressing   

## 2023-12-17 NOTE — Progress Notes (Signed)
Pt had to be brought back from cafeteria early after he was observed rubbing hand sanitizer all over his head.

## 2023-12-17 NOTE — Progress Notes (Signed)
At assessment today patient presents primarily linear but quickly becomes disorganized with flight of ideas and some delusional thoughts noted talking about writing rap songs, he has some notes written on paper in a disorganized manner, unable to formulate linear organized plan after discharge, denies side effect to medications since his stop Seroquel and reports fair sleep at night.  Patient had sheet covered sprayed on the ground with mattress on the ground off bed noting that the bed is not comfortable, also had toilet paper spread on the ground in 3 lines throughout the room noting "I just took a shower" chart review indicates patient is allergic to Haldol and Geodon, had elevated liver function test with hospitalist team reporting it was possibly related to Seroquel or Zyprexa.  He is currently on Invega 12 mg as well as Trileptal, will obtain Trileptal level tomorrow.  Will contact patient's family and assess if any improvement on Invega, if confirmed with no improvement on Invega will consider switching Invega to first generation antipsychotic probably Prolixin with potential to switch to Prolixin LAI to help with long-term compliance and decrease risk of decompensation after discharge.

## 2023-12-17 NOTE — [facility] Group Notes (Signed)
Pt did not attend wrap-up group   
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
## 2023-12-17 NOTE — Group Note (Addendum)
Recreation Therapy Group Note   Group Topic:Relaxation  Group Date: 12/17/2023 Start Time: 0950 End Time: 1100 Facilitators: Laneya Gasaway-McCall, LRT,CTRS Location: [address] Dayroom   Group Topic: Relaxation   Goal Area(s) Addresses:  Patient will identify positive relaxation techniques. Patient will identify benefits of using relaxation techniques post d/c.   Group Description: LRT and patients discussed how music can be used as a relaxation technique. Patients were allowed to pick songs they deemed calming and meaningful to them. Patients had to pick songs that were clean and appropriate during session.   Education:  Relaxation, Discharge Planning.    Education Outcome:  Acknowledges Education   Affect/Mood: Happy   Participation Level: Active   Participation Quality: Maximum Cues   Behavior: Cooperative   Speech/Thought Process: Delusional and Irrational   Insight: Lacking   Judgement: Lacking    Modes of Intervention: Music   Patient Response to Interventions:  Engaged   Education Outcome:  In group clarification offered    Clinical Observations/Individualized Feedback: Pt was into the music in group. Pt would attempt to rap/sing along to the music. Pt took the lyrics from the songs that played and created his own lyrics/songs. Pt would also drift off onto other topics that had nothing to do with group. Pt went on about being addicted to pain and wanting to take electrotherapy instead of pain pills. Pt also needed redirection not stop talking throughout group session.     Plan: Continue to engage patient in RT group sessions 2-3x/week.   Lucas Knapp, LRT,CTRS  12/17/2023 12:23 PM

## 2023-12-17 NOTE — Progress Notes (Signed)
CSW called to speak with pt's parent Lucas Knapp and Lucas Knapp [phone] to determine pt's baseline. Mrs. Lucas Knapp reported she talks to pt everyday and he is not at his baseline. Pt's mother reported pt is more reserved and not as talkative as he is displaying currently. Pt is more "moody" now and when at baseline he is "mild mannered and easy going". CSW has made a referral to [facility], [facility] who is scheduled to meet pt on 12/18/2023. Pt's family made aware and is in agreement. CSW will continue to follow.

## 2023-12-17 NOTE — Progress Notes (Signed)
   12/17/23 1000  Psych Admission Type (Psych Patients Only)  Admission Status Involuntary  Psychosocial Assessment  Patient Complaints None  Eye Contact Fair  Facial Expression Animated  Affect Preoccupied  Speech Tangential  Interaction Assertive  Motor Activity Restless;Fidgety  Appearance/Hygiene Disheveled  Behavior Characteristics Cooperative  Mood Pleasant;Preoccupied  Thought Process  Coherency Disorganized;Tangential  Content Preoccupation  Delusions Grandeur  Perception WDL  Hallucination None reported or observed  Judgment Poor  Confusion Mild  Danger to Self  Current suicidal ideation? Denies  Danger to Others  Danger to Others None reported or observed

## 2023-12-18 DIAGNOSIS — F250 Schizoaffective disorder, bipolar type: Secondary | ICD-10-CM | POA: Diagnosis not present

## 2023-12-18 LAB — COMPREHENSIVE METABOLIC PANEL
ALT: 266 U/L — ABNORMAL HIGH (ref 0–44)
AST: 122 U/L — ABNORMAL HIGH (ref 15–41)
Albumin: 4 g/dL (ref 3.5–5.0)
Alkaline Phosphatase: 61 U/L (ref 38–126)
Anion gap: 9 (ref 5–15)
BUN: 10 mg/dL (ref 6–20)
CO2: 25 mmol/L (ref 22–32)
Calcium: 9.5 mg/dL (ref 8.9–10.3)
Chloride: 102 mmol/L (ref 98–111)
Creatinine, Ser: 0.82 mg/dL (ref 0.61–1.24)
GFR, Estimated: >60 mL/min (ref >60)
Glucose, Bld: 96 mg/dL (ref 70–99)
Potassium: 4.1 mmol/L (ref 3.5–5.1)
Sodium: 136 mmol/L (ref 135–145)
Total Bilirubin: 0.6 mg/dL (ref <1.2)
Total Protein: 7.2 g/dL (ref 6.5–8.1)

## 2023-12-18 MED ORDER — FLUPHENAZINE HCL 5 MG PO TABS
5.0000 mg | ORAL_TABLET | Freq: Two times a day (BID) | ORAL | Status: DC
  Administered 2023-12-18 – 2023-12-20 (×4): 5 mg via ORAL
  Filled 2023-12-18 (×6): qty 1

## 2023-12-18 MED ORDER — WHITE PETROLATUM EX OINT
TOPICAL_OINTMENT | CUTANEOUS | Status: AC
  Filled 2023-12-18: qty 5

## 2023-12-18 MED ORDER — BENZTROPINE MESYLATE 1 MG PO TABS
1.0000 mg | ORAL_TABLET | Freq: Two times a day (BID) | ORAL | Status: DC
  Administered 2023-12-18 – 2023-12-31 (×26): 1 mg via ORAL
  Filled 2023-12-18 (×30): qty 1
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
## 2023-12-18 NOTE — Group Note (Signed)
Date:  12/18/2023 Time:  9:13 PM  Group Topic/Focus:  Wrap-Up Group:   The focus of this group is to help patients review their daily goal of treatment and discuss progress on daily workbooks.    Participation Level:  Did Not Attend  Scot Dock 12/18/2023, 9:13 PM

## 2023-12-18 NOTE — Progress Notes (Addendum)
ACTT [facility] [phone] present to assess pt for services. ACTT reported pt they would need pt to be more clear before they can provide services.   Pt continues to have disorganized thoughts, tangential, illogical with pressured speech. He is very difficult to interrupt or redirect. His statements sound illogical & nonsensical.   CSW received message from pt's brother Lucas Knapp who reported, he is being talking to his brother and he is not himself ' he does not sound like himself, I think his medications need to be changed" CSW will continue to follow.

## 2023-12-18 NOTE — Plan of Care (Signed)
  Problem: Education: Goal: Emotional status will improve Outcome: Progressing Goal: Mental status will improve Outcome: Progressing Goal: Verbalization of understanding the information provided will improve Outcome: Progressing  Patient is disorganized and disheveled denies SI/HI appears to be responding to internal stimuli. Compliant with medications no adverse effects noted.

## 2023-12-18 NOTE — Group Note (Signed)
Recreation Therapy Group Note   Group Topic:Communication  Group Date: 12/18/2023 Start Time: 1010 End Time: 1040 Facilitators: Ainhoa Rallo-McCall, LRT,CTRS Location: [address] Dayroom   Group Topic: Communication, Problem Solving   Goal Area(s) Addresses:  Patient will effectively listen to complete activity.  Patient will identify communication skills used to make activity successful.  Patient will identify how skills used during activity can be used to reach post d/c goals.    Intervention: Building surveyor Activity - Geometric pattern cards, pencils, blank paper    Group Description: Geometric Drawings.  Three volunteers from the peer group will be shown an abstract picture with a particular arrangement of geometrical shapes.  Each round, one 'speaker' will describe the pattern, as accurately as possible without revealing the image to the group.  The remaining group members will listen and draw the picture to reflect how it is described to them. Patients with the role of 'listener' cannot ask clarifying questions but, may request that the speaker repeat a direction. Once the drawings are complete, the presenter will show the rest of the group the picture and compare how close each person came to drawing the picture. LRT will facilitate a post-activity discussion regarding effective communication and the importance of planning, listening, and asking for clarification in daily interactions with others.   Education: Environmental consultant, Active listening, Support systems, Discharge planning   Education Outcome:  Acknowledges understanding/In group clarification offered/Needs additional education.    Affect/Mood: Anxious   Participation Level: Engaged   Participation Quality: Independent   Behavior: Hyperverbal and Interactive    Speech/Thought Process: Rational   Insight: Fair   Judgement: Fair    Modes of Intervention: Activity   Patient Response to  Interventions:  Engaged and Receptive   Education Outcome:  In group clarification offered    Clinical Observations/Individualized Feedback: Pt was hyper-verbal at beginning of group and making random comments. Pt identified smoke signals and antennas as ways to communicate. Pt was attentive enough to be the first presenter. Pt was able to give good instruction and good detail despite stuttering through his instructions. Pt was also attentive to peer during the second presentation. Pt did fairly well following the instructions given. Pt explained when he doesn't understand something, he will ask for the instructions to be repeated 3 times especially when he doesn't have pictures to look at. Pt was eventually called out of group and did not return.     Plan: Continue to engage patient in RT group sessions 2-3x/week.   Solace Manwarren-McCall, LRT,CTRS  12/18/2023 12:09 PM

## 2023-12-19 DIAGNOSIS — F250 Schizoaffective disorder, bipolar type: Secondary | ICD-10-CM | POA: Diagnosis not present

## 2023-12-19 NOTE — Progress Notes (Signed)
   12/19/23 1000  Psych Admission Type (Psych Patients Only)  Admission Status Involuntary  Psychosocial Assessment  Patient Complaints None  Eye Contact Fair  Facial Expression Animated  Affect Preoccupied  Speech Tangential  Interaction Assertive  Motor Activity Fidgety;Restless  Appearance/Hygiene Disheveled  Behavior Characteristics Cooperative  Mood Anxious;Preoccupied  Thought Process  Coherency Disorganized;Tangential;Flight of ideas  Content Preoccupation  Delusions Grandeur  Perception WDL  Hallucination None reported or observed  Judgment Poor  Confusion Mild  Danger to Self  Current suicidal ideation? Denies  Agreement Not to Harm Self Yes  Description of Agreement verbal  Danger to Others  Danger to Others None reported or observed

## 2023-12-19 NOTE — Group Note (Signed)
Date:  12/19/2023 Time:  9:01 PM  Group Topic/Focus:  Wrap-Up Group:   The focus of this group is to help patients review their daily goal of treatment and discuss progress on daily workbooks.    Participation Level:  Active  Participation Quality:  Appropriate  Affect:  Blunted and Defensive  Cognitive:  Disorganized, Confused, and Delusional  Insight: Limited  Engagement in Group:  Limited, Off Topic, and Poor  Modes of Intervention:  Discussion  Additional Comments:  Pt stated his goal for today was to focus on his treatment plan and discuss his discharge plan with his treatment team. Pt stated he accomplished his goals today. Pt stated he talked with his doctor and social worker about his care today. Pt rated his overall day a 0 out of 10.Pt stated he the devil was messing with him this is the reason he can not be discharge. Pt stated he is tired of been here. Writer informed him to talk with his treatment team. Pt stated he was able to contact his parents, uncle, and brother today. Pt stated he did not feel to good about himself today. Pt stated he was able to attend all meals. Pt stated he took all medications provided today. Pt stated he attend all groups held today. Pt stated his appetite was pretty good today. Pt rated sleep last night was pretty good. Pt stated the goal tonight was to get some rest. Pt stated he had had some physical pain tonight. Pt stated he always has pain all over his body. Pt nurse was updated on the situation. Pt deny visual hallucinations and auditory issues tonight. Pt denies thoughts of harming himself or others. Pt stated he would alert staff if anything changed  Felipa Furnace 12/19/2023, 9:01 PM
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
## 2023-12-19 NOTE — Plan of Care (Signed)
  Problem: Coping: Goal: Ability to verbalize frustrations and anger appropriately will improve Outcome: Progressing   Problem: Safety: Goal: Periods of time without injury will increase Outcome: Progressing   Problem: Nutritional: Goal: Ability to achieve adequate nutritional intake will improve Outcome: Progressing

## 2023-12-19 NOTE — Progress Notes (Signed)
Patient refused blood work this am. Patient educated Provided.

## 2023-12-19 NOTE — Plan of Care (Signed)
  Problem: Education: Goal: Knowledge of [facility] Education information/materials will improve Outcome: Progressing Goal: Emotional status will improve Outcome: Progressing Goal: Mental status will improve Outcome: Progressing Patient visible in milieu pleasant continues to be Tangential and hyper verbal very disorganized thought process. Compliant with medications. Support and encouragement provided.

## 2023-12-20 DIAGNOSIS — F250 Schizoaffective disorder, bipolar type: Secondary | ICD-10-CM | POA: Diagnosis not present

## 2023-12-20 MED ORDER — FLUPHENAZINE HCL 5 MG PO TABS
5.0000 mg | ORAL_TABLET | Freq: Three times a day (TID) | ORAL | Status: DC
  Administered 2023-12-20 – 2023-12-22 (×6): 5 mg via ORAL
  Filled 2023-12-20 (×13): qty 1

## 2023-12-20 NOTE — Plan of Care (Signed)
  Problem: Education: Goal: Knowledge of [facility] Education information/materials will improve Outcome: Progressing Goal: Emotional status will improve Outcome: Progressing Goal: Mental status will improve Outcome: Progressing   
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
## 2023-12-20 NOTE — Group Note (Signed)
Date:  12/20/2023 Time:  8:55 PM  Group Topic/Focus:  Wrap-Up Group:   The focus of this group is to help patients review their daily goal of treatment and discuss progress on daily workbooks.    Participation Level:  Active  Participation Quality:  Appropriate  Affect:  Appropriate  Cognitive:  Appropriate  Insight: Appropriate  Engagement in Group:  Limited  Modes of Intervention:  Discussion  Additional Comments:  Pt stated his goal for today was to focus on his treatment plan. Pt stated he accomplished his goal today. Pt stated he talked with his doctor and social worker about his care today. Pt rated his overall day a 10. Pt stated he was able to contact his parents today. Pt stated he was able to attend all meals. Pt stated he took all medications provided today. Pt stated he attend all groups held today. Pt stated his appetite was pretty good today. Pt rated sleep last night was pretty good. Pt stated the goal tonight was to get some rest. Pt stated he had had some physical pain in his lower back. Pt rated the pain in his lower back a 10 on the pain level scale. Pt nurse was updated on the situation. Pt deny visual hallucinations and auditory issues tonight. Pt denies thoughts of harming himself or others. Pt stated he would alert staff if anything changed   Felipa Furnace 12/20/2023, 8:55 PM

## 2023-12-20 NOTE — Progress Notes (Addendum)
Patient has been visible and interacting appropriately on the unit throughout the shift. Patient denies SI, HI and AVH on assessment. Patient has gone to meals and recreation off unit with no incident. Patient is tangential and disorganized, though somewhat redirectable. Patient noted to be having in-depth conversations on the phone throughout the day with pressured speech. Patient is adherent to all medications though states "I don't know what testing they're doing in the labs and having me take this." No adverse reactions were noted. No PRNs were administered this shift. Patient remains safe with q 15 min safety checks in place.   12/20/23 0800  Psych Admission Type (Psych Patients Only)  Admission Status Involuntary  Psychosocial Assessment  Patient Complaints None  Eye Contact Fair  Facial Expression Animated  Affect Preoccupied  Speech Tangential  Interaction Assertive  Motor Activity Restless  Appearance/Hygiene Layered clothes  Behavior Characteristics Cooperative  Mood Preoccupied  Thought Process  Coherency Tangential;Disorganized  Content Preoccupation  Delusions Grandeur  Perception WDL  Hallucination None reported or observed  Judgment Poor  Confusion Mild  Danger to Self  Current suicidal ideation? Denies  Agreement Not to Harm Self Yes  Description of Agreement verbal  Danger to Others  Danger to Others None reported or observed

## 2023-12-20 NOTE — Progress Notes (Signed)
°   12/19/23 2200  Psych Admission Type (Psych Patients Only)  Admission Status Involuntary  Psychosocial Assessment  Patient Complaints None  Eye Contact Fair  Facial Expression Animated  Affect Preoccupied  Speech Tangential  Interaction Assertive  Motor Activity Restless  Appearance/Hygiene Disheveled  Behavior Characteristics Cooperative  Mood Preoccupied  Thought Process  Coherency Disorganized;Tangential  Content Preoccupation  Delusions Grandeur  Perception WDL  Hallucination None reported or observed  Judgment Poor  Confusion Mild  Danger to Self  Current suicidal ideation? Denies  Agreement Not to Harm Self Yes  Description of Agreement verbal  Danger to Others  Danger to Others None reported or observed

## 2023-12-20 NOTE — Plan of Care (Signed)
  Problem: Education: Goal: Emotional status will improve Outcome: Progressing Goal: Mental status will improve Outcome: Progressing Goal: Verbalization of understanding the information provided will improve Outcome: Progressing   Problem: Activity: Goal: Interest or engagement in activities will improve Outcome: Progressing Goal: Sleeping patterns will improve Outcome: Progressing

## 2023-12-21 ENCOUNTER — Encounter (HOSPITAL_COMMUNITY): Payer: Self-pay

## 2023-12-21 DIAGNOSIS — F250 Schizoaffective disorder, bipolar type: Secondary | ICD-10-CM | POA: Diagnosis not present

## 2023-12-21 LAB — COMPREHENSIVE METABOLIC PANEL
ALT: 162 U/L — ABNORMAL HIGH (ref 0–44)
AST: 67 U/L — ABNORMAL HIGH (ref 15–41)
Albumin: 3.7 g/dL (ref 3.5–5.0)
Alkaline Phosphatase: 55 U/L (ref 38–126)
Anion gap: 7 (ref 5–15)
BUN: 8 mg/dL (ref 6–20)
CO2: 25 mmol/L (ref 22–32)
Calcium: 8.9 mg/dL (ref 8.9–10.3)
Chloride: 103 mmol/L (ref 98–111)
Creatinine, Ser: 0.86 mg/dL (ref 0.61–1.24)
GFR, Estimated: >60 mL/min (ref >60)
Glucose, Bld: 77 mg/dL (ref 70–99)
Potassium: 3.9 mmol/L (ref 3.5–5.1)
Sodium: 135 mmol/L (ref 135–145)
Total Bilirubin: 0.8 mg/dL (ref <1.2)
Total Protein: 6.6 g/dL (ref 6.5–8.1)

## 2023-12-21 NOTE — Plan of Care (Signed)
  Problem: Education: Goal: Emotional status will improve Outcome: Progressing Goal: Mental status will improve Outcome: Progressing   Problem: Activity: Goal: Interest or engagement in activities will improve Outcome: Progressing Goal: Sleeping patterns will improve Outcome: Progressing   Problem: Coping: Goal: Ability to demonstrate self-control will improve Outcome: Progressing   
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
## 2023-12-21 NOTE — Progress Notes (Signed)
Pt woke up around 2 am, could not go back to sleep. Kept come on the hallway, talking to himself and not following redirections. Pt given Zyprexa 5 mg, Restoril 15 mg, and vistaril 25 mg all PO. Pt is back bed, will continue to monitor.

## 2023-12-21 NOTE — Plan of Care (Addendum)
Problem: Safety: Goal: Ability to redirect hostility and anger into socially appropriate behaviors will improve Outcome: Progressing   Problem: Self-Care: Goal: Ability to participate in self-care as condition permits will improve Outcome: Progressing   Problem: Self-Concept: Goal: Will verbalize positive feelings about self Outcome: Progressing   Pt awake in hall on initial contact. Denies SI, HI, AVH and pain when assessed. Remains restless, disorganized but with less pressured and rapid speech. Reports he slept well last night with good appetite. However, he's tangential, demanding d/c "I told y'all though I need to go home, it's like my spine coming out of my back. I can't stay here. Let me go now". Emotional support, encouragement and reassurance offered to pt. Safety checks maintained at Q 15 minutes intervals without outburst.

## 2023-12-21 NOTE — [facility] Group Notes (Signed)
Adult Psychoeducational Group Note  Date:  12/21/2023 Time:  10:13 AM  Group Topic/Focus:  Goals Group:   The focus of this group is to help patients establish daily goals to achieve during treatment and discuss how the patient can incorporate goal setting into their daily lives to aide in recovery.  Participation Level:  Active  Participation Quality:  Appropriate  Affect:  Appropriate  Cognitive:  Appropriate  Insight: Appropriate  Engagement in Group:  Engaged  Modes of Intervention:  Discussion  Additional Comments:  THe patietn engaged in group.  Octavio Manns 12/21/2023, 10:13 AM

## 2023-12-21 NOTE — Progress Notes (Signed)
   12/20/23 2100  Psych Admission Type (Psych Patients Only)  Admission Status Involuntary  Psychosocial Assessment  Patient Complaints Anxiety  Eye Contact Fair  Facial Expression Animated  Affect Appropriate to circumstance  Speech Tangential  Interaction Assertive  Motor Activity Slow  Appearance/Hygiene Layered clothes  Behavior Characteristics Cooperative;Appropriate to situation  Mood Preoccupied  Thought Process  Coherency Circumstantial  Content Preoccupation  Delusions Grandeur  Perception WDL  Hallucination None reported or observed  Judgment Poor  Confusion Mild  Danger to Self  Current suicidal ideation? Denies  Agreement Not to Harm Self Yes  Description of Agreement verbal  Danger to Others  Danger to Others None reported or observed

## 2023-12-21 NOTE — Group Note (Signed)
Recreation Therapy Group Note   Group Topic:Team Building  Group Date: 12/21/2023 Start Time: 1025 End Time: 1047 Facilitators: Amillion Macchia-McCall, LRT,CTRS Location: [address] Dayroom   Group Topic: Communication, Team Building, Problem Solving  Goal Area(s) Addresses:  Patient will effectively work with peer towards shared goal.  Patient will identify skills used to make activity successful.  Patient will share challenges and verbalize solution-driven approaches used. Patient will identify how skills used during activity can be used to reach post d/c goals.   Intervention: STEM Activity   Group Description: [employer]. Patients were provided the following materials: 2 drinking straws, 5 rubber bands, 5 paper clips, 2 index cards and 2 drinking cups. Using the provided materials patients were asked to build a launching mechanism to launch a ping pong ball across the room, approximately 10 feet. Patients were divided into teams of 3-5. Instructions required all materials be incorporated into the device, functionality of items left to the peer group's discretion.  Education: Pharmacist, community, Scientist, physiological, Air cabin crew, Building control surveyor.   Education Outcome: Acknowledges education/In group clarification offered/Needs additional education.    Affect/Mood: Appropriate   Participation Level: Moderate   Participation Quality: Moderate Cues   Behavior: Bizarre and Distracted   Speech/Thought Process: Irrational   Insight: Poor   Judgement: Poor   Modes of Intervention: STEM Activity   Patient Response to Interventions:  Challenging    Education Outcome:  In group clarification offered    Clinical Observations/Individualized Feedback: Pt had random laughing and struggled to focus on activity. Pt was distracted and having random conversations with peer that had nothing to do with the activity. Pt needed redirection multiple times to get on task.    Plan:  Continue to engage patient in RT group sessions 2-3x/week.   Grettell Ransdell A Roger Fasnacht-McCall, NT,  12/21/2023 12:58 PM

## 2023-12-21 NOTE — Group Note (Signed)
[facility] LCSW Group Therapy Note   Group Date: 12/21/2023 Start Time: 1300 End Time: 1400   Type of Therapy/Topic:  Group Therapy:  Emotion Regulation  Participation Level:  Minimal   Mood:  Description of Group:    The purpose of this group is to assist patients in learning to regulate negative emotions and experience positive emotions. Patients will be guided to discuss ways in which they have been vulnerable to their negative emotions. These vulnerabilities will be juxtaposed with experiences of positive emotions or situations, and patients challenged to use positive emotions to combat negative ones. Special emphasis will be placed on coping with negative emotions in conflict situations, and patients will process healthy conflict resolution skills.  Therapeutic Goals: Patient will identify two positive emotions or experiences to reflect on in order to balance out negative emotions:  Patient will label two or more emotions that they find the most difficult to experience:  Patient will be able to demonstrate positive conflict resolution skills through discussion or role plays:   Summary of Patient Progress: Patient was present for 15 minutes.  He was standing at first, then sat on the floor.  His thought process was disorganized and he shared that he wanted to be discharged.      Therapeutic Modalities:   Cognitive Behavioral Therapy Feelings Identification Dialectical Behavioral Therapy   Radha Coggins O Kalmen Lollar, LCSWA

## 2023-12-21 NOTE — Progress Notes (Signed)
   12/21/23 2000  Psych Admission Type (Psych Patients Only)  Admission Status Involuntary  Psychosocial Assessment  Patient Complaints Anxiety  Eye Contact Fair  Facial Expression Animated;Anxious  Affect Appropriate to circumstance  Speech Tangential  Interaction Assertive  Motor Activity Slow;Restless  Appearance/Hygiene Improved  Behavior Characteristics Cooperative  Mood Preoccupied;Pleasant  Aggressive Behavior  Effect No apparent injury  Thought Process  Coherency Disorganized;Loose associations  Content Preoccupation;Blaming others  Delusions Grandeur  Perception WDL  Hallucination None reported or observed  Judgment Poor  Confusion Mild  Danger to Self  Current suicidal ideation? Denies  Danger to Others  Danger to Others None reported or observed

## 2023-12-21 NOTE — Plan of Care (Signed)
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
## 2023-12-22 DIAGNOSIS — F250 Schizoaffective disorder, bipolar type: Secondary | ICD-10-CM | POA: Diagnosis not present

## 2023-12-22 LAB — 10-HYDROXYCARBAZEPINE: Triliptal/MTB(Oxcarbazepin): 7 ug/mL — ABNORMAL LOW (ref 10–35)

## 2023-12-22 LAB — HEPATITIS PANEL, ACUTE
HCV Ab: NONREACTIVE
Hep A IgM: NONREACTIVE
Hep B C IgM: NONREACTIVE
Hepatitis B Surface Ag: NONREACTIVE

## 2023-12-22 MED ORDER — FLUPHENAZINE HCL 2.5 MG PO TABS
7.5000 mg | ORAL_TABLET | Freq: Three times a day (TID) | ORAL | Status: DC
  Administered 2023-12-22 – 2023-12-24 (×7): 7.5 mg via ORAL
  Filled 2023-12-22 (×12): qty 3
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
## 2023-12-22 NOTE — Progress Notes (Signed)
   12/22/23 2007  Psych Admission Type (Psych Patients Only)  Admission Status Involuntary  Psychosocial Assessment  Patient Complaints Worrying  Eye Contact Fair  Facial Expression Flat  Affect Appropriate to circumstance  Speech Tangential  Interaction Assertive  Motor Activity Slow  Appearance/Hygiene Improved  Behavior Characteristics Cooperative;Appropriate to situation  Mood Pleasant;Preoccupied  Thought Process  Coherency Disorganized;Tangential  Content Blaming others;Preoccupation  Delusions Grandeur  Perception WDL  Hallucination None reported or observed  Judgment Poor  Confusion Mild  Danger to Self  Current suicidal ideation? Denies

## 2023-12-22 NOTE — [facility] Group Notes (Signed)
 [facility] Group Notes:  (Nursing/MHT/Case Management/Adjunct)  Date:  12/22/2023  Time:  2000  Type of Therapy:   wrap up group  Participation Level:  Did Not Attend  Participation Quality:   did not attend  Affect:   Did not attend  Cognitive:   Did not attend  Insight:  None  Engagement in Group:   Did not attend  Modes of Intervention:   Did not attend  Summary of Progress/Problems: Pt. Were aware of group time.  Lucas Knapp 12/22/2023, 9:33 PM

## 2023-12-22 NOTE — Group Note (Signed)
LCSW Group Therapy Note   Group Date: 12/22/2023 Start Time: 1100 End Time: 1200  Group was not held on 500 hall.  Group was held for patients on 300 and 400.   Lucas Knapp, LCSWA 12/22/2023  3:00 PM

## 2023-12-22 NOTE — Group Note (Signed)
 Recreation Therapy Group Note   Group Topic:Health and Wellness  Group Date: 12/22/2023 Start Time: 1045 End Time: 1115 Facilitators: Jancy Sprankle-McCall, LRT,CTRS Location: 500 Hall Dayroom   Group Topic: Wellness  Goal Area(s) Addresses:  Patient will define components of whole wellness. Patient will verbalize benefit of whole wellness.  Intervention: Music, Meditation  Group Description: Exercise and Meditation. LRT and patients discussed the components of whole wellness. Patients were then instructed the group would spend half the group doing exercise and the other half meditating. Patients were to take turns leading the group in exercises of their choosing after which, LRT would play a meditation on believing in yourself to get to the future you see for yourself.   Education: Wellness, Building Control Surveyor.   Education Outcome: Acknowledges education/In group clarification offered/Needs additional education.    Affect/Mood: Drowsy   Participation Level: Minimal   Participation Quality: Independent   Behavior: Sleepy   Speech/Thought Process: None   Insight: None   Judgement: None   Modes of Intervention: Music and Meditation   Patient Response to Interventions:  Challenging    Education Outcome:  In group clarification offered    Clinical Observations/Individualized Feedback: Pt was late to group and came in during the meditation. Pt was quiet and appeared drowsy. Pt had moments were he would fall asleep but also moments were he was resisting falling asleep.    Plan: Continue to engage patient in RT group sessions 2-3x/week.   Ludie Pavlik-McCall, LRT,CTRS 12/22/2023 1:22 PM

## 2023-12-22 NOTE — Plan of Care (Signed)
  Problem: Coping: °Goal: Ability to demonstrate self-control will improve °Outcome: Progressing °  °Problem: Health Behavior/Discharge Planning: °Goal: Compliance with treatment plan for underlying cause of condition will improve °Outcome: Progressing °  °

## 2023-12-22 NOTE — Plan of Care (Signed)
 Problem: Activity: Goal: Interest or engagement in activities will improve Outcome: Progressing   Problem: Coping: Goal: Ability to verbalize frustrations and anger appropriately will improve Outcome: Progressing   Problem: Nutritional: Goal: Ability to achieve adequate nutritional intake will improve Outcome: Progressing   Problem: Self-Care: Goal: Ability to participate in self-care as condition permits will improve Outcome: Progressing   Pt reports he slept well last night with good appetite. Denies SI, HI, AVH and pain when assessed. Remains tangential, preoccupied about d/c, grandiose on interactions but is cooperative with care. Tolerated meals and fluids well. Remains medication compliant, denies adverse drug reactions. Pt's concerns for d/c validated; IVC court held, admission time increased.  Emotional support, encouragement and reassurance offered. Safety maintained at Q 15 minutes intervals without incident.

## 2023-12-23 DIAGNOSIS — F250 Schizoaffective disorder, bipolar type: Secondary | ICD-10-CM | POA: Diagnosis not present

## 2023-12-23 NOTE — Plan of Care (Signed)
  Problem: Education: Goal: Knowledge of [facility] Education information/materials will improve Outcome: Progressing   Problem: Coping: Goal: Ability to verbalize frustrations and anger appropriately will improve Outcome: Progressing   Problem: Coping: Goal: Ability to demonstrate self-control will improve Outcome: Progressing   Problem: Coping: Goal: Ability to demonstrate self-control will improve Outcome: Progressing   Problem: Physical Regulation: Goal: Ability to maintain clinical measurements within normal limits will improve Outcome: Progressing   Problem: Safety: Goal: Periods of time without injury will increase Outcome: Progressing   Problem: Activity: Goal: Will verbalize the importance of balancing activity with adequate rest periods Outcome: Progressing   Problem: Self-Care: Goal: Ability to participate in self-care as condition permits will improve Outcome: Progressing

## 2023-12-23 NOTE — Group Note (Signed)
 Recreation Therapy Group Note   Group Topic:Communication  Group Date: 12/23/2023 Start Time: 1035 End Time: 1100 Facilitators: Sneha Willig-McCall, LRT,CTRS Location: 500 Hall Dayroom   Group Topic: Communication  Goal Area(s) Addresses:  Patient will effectively communicate in group.  Patient will verbalize benefit of healthy communication.   Group Description: LRT and patients had an open conversation about what they were looking forward to in the new year. Patients also identified any goals they wanted to work on in the new year.   Education: Communication, Discharge Planning  Education Outcome: Acknowledges understanding/In group clarification offered/Needs additional education.    Affect/Mood: Appropriate   Participation Level: Engaged   Participation Quality: Independent   Behavior: Appropriate   Speech/Thought Process: Relevant   Insight: Moderate   Judgement: Moderate   Modes of Intervention: Open Conversation   Patient Response to Interventions:  Engaged   Education Outcome:  In group clarification offered    Clinical Observations/Individualized Feedback: Pt was in a good mood. Pt was bright and engaged. Pt wasn't over talking others and remained on topic. Pt was asked what he wanted to work on and pt didn't identify anything but stated he would work on it when he gets out of here.     Plan: Continue to engage patient in RT group sessions 2-3x/week.   Karcyn Menn-McCall, LRT,CTRS  12/23/2023 1:43 PM
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
## 2023-12-23 NOTE — Plan of Care (Signed)
   Problem: Education: Goal: Emotional status will improve Outcome: Progressing Goal: Mental status will improve Outcome: Progressing   Problem: Activity: Goal: Interest or engagement in activities will improve Outcome: Progressing Goal: Sleeping patterns will improve Outcome: Progressing   Problem: Coping: Goal: Ability to demonstrate self-control will improve Outcome: Progressing

## 2023-12-23 NOTE — Progress Notes (Signed)
   12/23/23 1315  Psych Admission Type (Psych Patients Only)  Admission Status Involuntary  Psychosocial Assessment  Patient Complaints Anxiety  Eye Contact Fair  Facial Expression Anxious  Affect Appropriate to circumstance  Speech Tangential  Interaction Assertive  Motor Activity Fidgety  Appearance/Hygiene Unremarkable  Behavior Characteristics Cooperative;Appropriate to situation  Mood Preoccupied  Aggressive Behavior  Effect No apparent injury  Thought Process  Coherency Disorganized  Content Blaming others  Delusions Grandeur  Perception WDL  Hallucination None reported or observed  Judgment Limited  Confusion Mild  Danger to Self  Current suicidal ideation? Denies  Agreement Not to Harm Self Yes  Description of Agreement verbal  Danger to Others  Danger to Others None reported or observed   Pt awake and alert.  Denies SI, HI or AVH.  Pt visible in milieu. Has been maintaining appropriate boundaries and taking medication without prompting. Pt has been observed on the phone several times.  No overt distress noted. Q 15 min checks for safety.

## 2023-12-23 NOTE — Progress Notes (Signed)
   12/23/23 2045  Psych Admission Type (Psych Patients Only)  Admission Status Involuntary  Psychosocial Assessment  Patient Complaints Anxiety  Eye Contact Fair  Facial Expression Anxious  Affect Appropriate to circumstance  Speech Tangential  Interaction Assertive  Motor Activity Restless  Appearance/Hygiene Unremarkable  Behavior Characteristics Cooperative  Mood Suspicious;Preoccupied  Aggressive Behavior  Effect No apparent injury  Thought Process  Coherency Disorganized;Circumstantial  Content Blaming others  Delusions Grandeur  Perception WDL  Hallucination None reported or observed  Judgment Limited  Confusion Mild  Danger to Self  Current suicidal ideation? Denies  Danger to Others  Danger to Others None reported or observed

## 2023-12-24 DIAGNOSIS — F250 Schizoaffective disorder, bipolar type: Secondary | ICD-10-CM | POA: Diagnosis not present

## 2023-12-24 MED ORDER — FLUPHENAZINE HCL 10 MG PO TABS
10.0000 mg | ORAL_TABLET | Freq: Three times a day (TID) | ORAL | Status: DC
  Administered 2023-12-24 – 2023-12-28 (×12): 10 mg via ORAL
  Filled 2023-12-24 (×10): qty 1
  Filled 2023-12-24 (×2): qty 2
  Filled 2023-12-24 (×7): qty 1

## 2023-12-24 NOTE — [facility] Group Notes (Signed)
 [facility] Group Notes:  (Nursing/MHT/Case Management/Adjunct)   Adult Psychoeducational Group Note  Date:  12/24/2023 Time:  11:10 AM  Group Topic/Focus:  Goals Group:   The focus of this group is to help patients establish daily goals to achieve during treatment and discuss how the patient can incorporate goal setting into their daily lives to aide in recovery. Orientation:   The focus of this group is to educate the patient on the purpose and policies of crisis stabilization and provide a format to answer questions about their admission.  The group details unit policies and expectations of patients while admitted.  Participation Level:  Active  Participation Quality:  Appropriate  Affect:  Appropriate  Cognitive:  Appropriate  Insight: Good  Engagement in Group:  Engaged  Modes of Intervention:  Discussion  Additional Comments:  Pt shared goals with the group.  Lucas Knapp Molly 12/24/2023, 11:10 AM

## 2023-12-24 NOTE — Group Note (Signed)
 Date:  12/24/2023 Time:  9:01 PM  Group Topic/Focus:  Wrap-Up Group:   The focus of this group is to help patients review their daily goal of treatment and discuss progress on daily workbooks.    Participation Level:  Active  Participation Quality:  Appropriate  Affect:  Appropriate  Cognitive:  Appropriate  Insight: Appropriate  Engagement in Group:  Developing/Improving  Modes of Intervention:  Discussion  Additional Comments:  Pt stated his goal for today was to focus on his treatment plan and his medication issue with his treatment team. Pt stated he accomplished his goals today. Pt stated he talked with his doctor and social worker about his care today. Pt rated his overall day a 3 out of 10. Pt stated he made no calls today. Pt stated he felt better about himself today. Pt stated he was able to attend all meals. Pt stated he took all medications provided today. Pt stated he attend all groups held today. Pt stated his appetite was fair today. Pt rated sleep last night was pretty good. Pt stated the goal tonight was to get some rest. Pt stated he had some physical pain tonight. Pt stated he had some lower back pain. Pt rated his lower back pain a 10 on the pain level scale. Pt nurse was updated on the situation. Pt deny visual hallucinations and auditory issues tonight. Pt denies thoughts of harming himself or others. Pt stated he would alert staff if anything changed  Lonni Na 12/24/2023, 9:01 PM

## 2023-12-24 NOTE — Group Note (Signed)
 Recreation Therapy Group Note   Group Topic:Leisure Education  Group Date: 12/24/2023 Start Time: 0940 End Time: 1020 Facilitators: Fong Mccarry-McCall, LRT,CTRS Location: [address] Dayroom   Group Topic: Leisure Education  Goal Area(s) Addresses:  Patient will use appropriate interactions in play with peers.   Patient will exhibit appropriate behavior during group session.   Intervention: Group Game   Group Description: Keep it Contractor. Patients were placed in a circle and were instructed to hit the volleyball to each other in any direction they chose. Patients were to keep the ball in rotation without it coming to a stop. The ball could be bounced off the floor or roll just as long as it didn't come to a stop. At the same time, LRT would be keeping time of how long the ball stayed in rotation. If the ball came to a stop, the time would start over.     Affect/Mood: Appropriate   Participation Level: Engaged   Participation Quality: Independent   Behavior: Appropriate   Speech/Thought Process: Delusional   Insight: Fair   Judgement: Fair    Modes of Intervention: Cooperative Play   Patient Response to Interventions:  Engaged   Education Outcome:  In group clarification offered    Clinical Observations/Individualized Feedback: Pt was engaged and interactive during group session. Pt worked well with peers in completing the task. Pt was also speaking of an imaginary male friend. Pt would laugh every time he spoke of her. Pt was eventually called out of group to meet with NP and didn't return.      Plan: Continue to engage patient in RT group sessions 2-3x/week.   Lucas Knapp, LRT,CTRS 12/24/2023 1:06 PM

## 2023-12-24 NOTE — [facility] Group Notes (Signed)
 Pt was encouraged but refused to attend wrap up group

## 2023-12-24 NOTE — Progress Notes (Signed)
   12/24/23 1500  Psych Admission Type (Psych Patients Only)  Admission Status Involuntary  Psychosocial Assessment  Patient Complaints Anxiety  Eye Contact Fair  Facial Expression Anxious  Affect Appropriate to circumstance  Speech Tangential  Interaction Assertive  Motor Activity Restless  Appearance/Hygiene Unremarkable  Behavior Characteristics Cooperative  Mood Preoccupied  Thought Process  Coherency Circumstantial  Content Blaming others  Delusions Grandeur  Perception WDL  Hallucination None reported or observed  Judgment Limited  Confusion None  Danger to Self  Current suicidal ideation? Denies  Danger to Others  Danger to Others None reported or observed
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
## 2023-12-25 ENCOUNTER — Encounter (HOSPITAL_COMMUNITY): Payer: Self-pay

## 2023-12-25 DIAGNOSIS — F250 Schizoaffective disorder, bipolar type: Secondary | ICD-10-CM | POA: Diagnosis not present

## 2023-12-25 LAB — HEPATIC FUNCTION PANEL
ALT: 201 U/L — ABNORMAL HIGH (ref 0–44)
AST: 91 U/L — ABNORMAL HIGH (ref 15–41)
Albumin: 4.1 g/dL (ref 3.5–5.0)
Alkaline Phosphatase: 59 U/L (ref 38–126)
Bilirubin, Direct: 0.1 mg/dL (ref 0.0–0.2)
Indirect Bilirubin: 0.6 mg/dL (ref 0.3–0.9)
Total Bilirubin: 0.7 mg/dL (ref 0.0–1.2)
Total Protein: 7.3 g/dL (ref 6.5–8.1)

## 2023-12-25 MED ORDER — WHITE PETROLATUM EX OINT
TOPICAL_OINTMENT | CUTANEOUS | Status: AC
  Filled 2023-12-25: qty 5

## 2023-12-25 NOTE — Progress Notes (Addendum)
 [facility] [phone] met with pt again, to assess for ACTT services. During assessment pt was fixated on the government. ACTT reported pt was more coherent than previous visit. ACTT reported pt would be appropriate for services however they are skeptical if pt would engage. CSW called pt's brother, Lucas Knapp [phone] to share outcome of interview. Pt's brother reported pt would benefit from service and in turn called parents. Mr and Mrs Knapp inquired if another interview could be scheduled for pt and ACTT, they would like to be present.   CSW scheduled appt for 12/31/2023 at 2:00 pm, family will be present. CSW will update Treatment Team.

## 2023-12-25 NOTE — Plan of Care (Signed)
  Problem: Education: Goal: Emotional status will improve Outcome: Progressing   Problem: Physical Regulation: Goal: Ability to maintain clinical measurements within normal limits will improve Outcome: Progressing   Problem: Safety: Goal: Periods of time without injury will increase Outcome: Progressing   Problem: Coping: Goal: Coping ability will improve Outcome: Progressing

## 2023-12-25 NOTE — Progress Notes (Signed)
 Pt stated he was agitated due to peer yelling and throwing trash on the floor in the hallway.

## 2023-12-25 NOTE — [facility] Group Notes (Signed)
 Spiritual care group facilitated by Chaplain Rockie Sofia, [facility] and Alan Lesches, Mdiv  Group focused on topic of strength. Group members reflected on what thoughts and feelings emerge when they hear this topic. They then engaged in facilitated dialog around how strength is present in their lives. This dialog focused on representing what strength had been to them in their lives (images and patterns given) and what they saw as helpful in their life now (what they needed / wanted).  Activity drew on narrative framework.  Patient Progress: Lucas Knapp attended group and actively engaged and participated in group conversation and activities.  Comments were on topic and appropriate and contributed positively to the group conversation.

## 2023-12-25 NOTE — Plan of Care (Signed)
°  Problem: Education: °Goal: Emotional status will improve °Outcome: Progressing °  °Problem: Coping: °Goal: Ability to verbalize frustrations and anger appropriately will improve °Outcome: Progressing °Goal: Ability to demonstrate self-control will improve °Outcome: Progressing °  °

## 2023-12-25 NOTE — Progress Notes (Addendum)
 2045   At bedtime medication pass, pt became intense in affect and indicated he was irritated by a peer that had loud voice tones and was banging on walls and windows.  He was shifting his weight from foot to foot and made sounds of exasperation.  Pt completed med administration and walked briskly to his room to rest for the evening.  0630  Pt was calm, cooperative, and pleasant with interacting this morning.  He reported he felt he was sleeping too much, and will talk to the Dr. Today to discuss his treatment plan.  Acknowledged pt's self control last night to redirect himself to room when irritated by peer.     12/24/23 2105  Psych Admission Type (Psych Patients Only)  Admission Status Involuntary  Psychosocial Assessment  Patient Complaints Irritability (d/t peer being loud)  Eye Contact Fair  Facial Expression Flat  Affect Irritable (triggered by loud peer)  Speech Logical/coherent  Interaction Minimal  Motor Activity Restless  Appearance/Hygiene Unremarkable  Behavior Characteristics Cooperative  Mood Irritable  Thought Process  Coherency Concrete thinking  Content Blaming others  Delusions None reported or observed  Perception WDL  Hallucination None reported or observed  Judgment Limited  Confusion None  Danger to Self  Current suicidal ideation? Denies  Danger to Others  Danger to Others None reported or observed

## 2023-12-25 NOTE — Group Note (Signed)
 Date:  12/25/2023 Time:  9:25 PM  Group Topic/Focus:  Wrap-Up Group:   The focus of this group is to help patients review their daily goal of treatment and discuss progress on daily workbooks.    Participation Level:  Did Not Attend  Participation Quality:   Did Not Attend  Affect:   Did Not Attend  Cognitive:   Did Not Attend  Insight: None  Engagement in Group:   Did Not Attend  Modes of Intervention:   Did Not Attend  Additional Comments:   Pt was encouraged to attend wrap up group but did not attend. Lonni Na 12/25/2023, 9:25 PM
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
## 2023-12-25 NOTE — Plan of Care (Signed)
   Problem: Coping: Goal: Ability to verbalize frustrations and anger appropriately will improve Outcome: Progressing Goal: Ability to demonstrate self-control will improve Outcome: Progressing
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
## 2023-12-25 NOTE — Group Note (Signed)
 Recreation Therapy Group Note   Group Topic:Team Building  Group Date: 12/25/2023 Start Time: 1008 End Time: 1040 Facilitators: Milarose Savich-McCall, LRT,CTRS Location: [address] Dayroom   Group Topic: Communication, Team Building, Problem Solving  Goal Area(s) Addresses:  Patient will effectively work with peer towards shared goal.  Patient will identify skills used to make activity successful.  Patient will identify how skills used during activity can be used to reach post d/c goals.   Intervention: STEM Activity  Group Description: Straw Bridge. In teams of 3-5, patients were given 15 plastic drinking straws and an equal length of masking tape. Using the materials provided, patients were instructed to build a free standing bridge-like structure to suspend an everyday item (ex: puzzle box) off of the floor or table surface. All materials were required to be used by the team in their design. LRT facilitated post-activity discussion reviewing team process. Patients were encouraged to reflect how the skills used in this activity can be generalized to daily life post discharge.   Education: Pharmacist, Community, Scientist, Physiological, Discharge Planning   Education Outcome: Acknowledges education/In group clarification offered/Needs additional education.    Affect/Mood: N/A   Participation Level: Did not attend    Clinical Observations/Individualized Feedback:      Plan: Continue to engage patient in RT group sessions 2-3x/week.   Makenlee Mckeag-McCall, LRT,CTRS 12/25/2023 12:28 PM

## 2023-12-26 DIAGNOSIS — F200 Paranoid schizophrenia: Secondary | ICD-10-CM | POA: Diagnosis not present

## 2023-12-26 NOTE — Progress Notes (Signed)
 [facility] MD Progress Note  12/26/2023 8:23 AM Lucas Knapp  MRN:  [id number]  Subjective:    Lucas Knapp is a 38 y.o. male with past psychiatric history of schizoaffective d/o-bipolar type, cannabis use d/o, HTN, who presented as a walk-in with mom Lucas Knapp [phone]) [facility] (12/04/2023) then transferred Involuntary to [facility] (12/06/2023) for evaluation and management of increased agitation, auditory hallucinations, disorganized thinking and bizarre behavior.   On assessment today, patient is more suspicious and paranoid than he was of the last few days.  He reports that the ACT team is not going to help him in his best interest.  He reports  they all want me to have this team and the government is going to take it away.  They just hold my money.  It is string team.  It is the bean team.  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  They will not let me into my house.  They are coming to my house.  I am homeless.  I cannot let them into my home.  He goes on to discuss I have flashbacks.  Whatever that means.  [facility].  It triggered me.  I started talking to the TV.  It was getting messages. Denies feeling depressed.  Reports feeling anxious.  Denies any AH or VH.  Denies any SI or HI.   Reports medications are causing him to feel tired.  Otherwise denies other side effects to current psychiatric medications.  Patient no longer is agreeable to getting an LAI, as he previously was over the past week or so.  Per social work, ACT team visited yesterday and would like to meet with the patient and his family again.  Patient's mother and father and brother can come to [facility] on next Thursday, to meet with the patient and ACT team at the same time.  I discussed with the patient that if that meeting goes well, hopefully we can discharge him after that meeting on next Thursday 1-9.    Principal Problem: Schizoaffective disorder, bipolar type (HCC) Diagnosis:  Principal Problem:   Schizoaffective disorder, bipolar type (HCC) Active Problems:   Cannabis use disorder   Tobacco use disorder   Housing insecurity   Paranoid schizophrenia (HCC)   Muscle spasm of back   Long term current use of antipsychotic medication  Total Time spent with patient: 15 minutes  Past Psychiatric History:  Previous psych diagnoses: Schizoaffective disorder-bipolar type (previous differentials included schizophrenia, paranoid schizophrenia, bipolar 1 disorder), cannabis use disorder, tobacco use disorder Prior inpatient psychiatric treatment: Yes Prior outpatient psychiatric treatment: Haldol  5 mg qAM and 10 mg at night, Zyprexa  10 mg at bedtime, Abilify 10 mg daily,  Current psychiatric provider: Zane Bach, NP [facility]    Neuromodulation history: denies   Current therapist:  N/A   Psychotherapy hx:  N/A  History of suicide attempts: None per chart review  History of homicide: None per chart review      Past Medical History:  Past Medical History:  Diagnosis Date   Back pain    Bipolar 1 disorder (HCC)    Bipolar disease, manic (HCC) 06/08/2012   Cannabis use disorder 09/17/2012   Headache 06/08/2012   Hyperlipidemia    Hypertension    pt has been prescribed HCTZ 12.5 mg daily. Pt was 140/80 on admission.    Insomnia 05/02/2014   Long term current use of antipsychotic medication 12/16/2023   Schizoaffective disorder, bipolar type (  HCC) 06/08/2012   Schizophrenia (HCC)    Traumatic myalgia 06/08/2012   History reviewed. No pertinent surgical history. Family History:  Family History  Problem Relation Age of Onset   Hypertension Father    Hyperlipidemia Father    Hypertension Paternal Grandmother    Hypertension Paternal Grandfather    Hypertension Brother        identical twin   Psychosis Brother        mental issues     Family Psychiatric  History: See H&P  Social History:  Social History   Substance and Sexual  Activity  Alcohol  Use Yes   Alcohol /week: 4.0 standard drinks of alcohol    Types: 4 Cans of beer per week   Comment: occasional alcohol  use roughly 2x weekly, 1-2 drinks per event     Social History   Substance and Sexual Activity  Drug Use Yes   Types: Marijuana   Comment: Pt refused to comment on drug use    Social History   Socioeconomic History   Marital status: Single    Spouse name: Not on file   Number of children: Not on file   Years of education: Not on file   Highest education level: Not on file  Occupational History   Not on file  Tobacco Use   Smoking status: Every Day    Types: Cigarettes   Smokeless tobacco: Never   Tobacco comments:    10-15  Vaping Use   Vaping status: Not on file  Substance and Sexual Activity   Alcohol  use: Yes    Alcohol /week: 4.0 standard drinks of alcohol     Types: 4 Cans of beer per week    Comment: occasional alcohol  use roughly 2x weekly, 1-2 drinks per event   Drug use: Yes    Types: Marijuana    Comment: Pt refused to comment on drug use   Sexual activity: Not Currently  Other Topics Concern   Not on file  Social History Narrative   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 Also works for the [employer] one day a week.    Single   No children   Completed college (bachelors in Statistician)   Enjoys swimming, playing pool, walking   Grew up [place] .  Has identical twin and 2 other brothers in [place]      06/2022: Homeless      07/03/22: Pt says he lives alone and works but doesn't specify his job. Pt denies social support. Has psychiatric services with Lucas Bach, NP.   Social Drivers of Corporate Investment Banker Strain: Not on file  Food Insecurity: Patient Declined (12/06/2023)   Hunger Vital Sign    Worried About Running Out of Food in the Last Year: Patient declined    Ran Out of Food in the Last Year: Patient declined  Transportation Needs: Patient Declined (12/06/2023)   PRAPARE -  Administrator, Civil Service (Medical): Patient declined    Lack of Transportation (Non-Medical): Patient declined  Physical Activity: Not on file  Stress: Not on file  Social Connections: Not on file   Additional Social History:                           Current Medications: Current Facility-Administered Medications  Medication Dose Route Frequency Provider Last Rate Last Admin   alum & mag hydroxide-simeth (MAALOX/MYLANTA) 200-200-20 MG/5ML suspension 30 mL  30 mL Oral  Q4H PRN Teresa Jes L, NP   30 mL at 12/07/23 0326   amLODipine  (NORVASC ) tablet 10 mg  10 mg Oral QHS Nguyen, Julie, DO   10 mg at 12/25/23 2047   benztropine  (COGENTIN ) tablet 1 mg  1 mg Oral BID Nwoko, Agnes I, NP   1 mg at 12/25/23 2049   fluPHENAZine  (PROLIXIN ) tablet 10 mg  10 mg Oral Q8H Ntuen, Tina C, FNP   10 mg at 12/26/23 0645   hydrOXYzine  (ATARAX ) tablet 25 mg  25 mg Oral TID PRN White, Patrice L, NP   25 mg at 12/21/23 1415   ibuprofen  (ADVIL ) tablet 400 mg  400 mg Oral Q4H PRN Johny Lot, MD   400 mg at 12/22/23 2007   magnesium  hydroxide (MILK OF MAGNESIA) suspension 30 mL  30 mL Oral Daily PRN White, Patrice L, NP       OLANZapine  (ZYPREXA ) injection 10 mg  10 mg Intramuscular TID PRN White, Patrice L, NP   10 mg at 12/06/23 2321   OLANZapine  (ZYPREXA ) injection 5 mg  5 mg Intramuscular TID PRN White, Patrice L, NP       OLANZapine  zydis (ZYPREXA ) disintegrating tablet 5 mg  5 mg Oral TID PRN White, Patrice L, NP   5 mg at 12/21/23 0341   Oxcarbazepine  (TRILEPTAL ) tablet 300 mg  300 mg Oral Q12H Nguyen, Julie, DO   300 mg at 12/25/23 2047   sodium chloride  (OCEAN) 0.65 % nasal spray 1 spray  1 spray Each Nare BID PRN Teresa Jes L, NP   1 spray at 12/25/23 9275   temazepam  (RESTORIL ) capsule 15 mg  15 mg Oral QHS PRN Nguyen, Julie, DO   15 mg at 12/23/23 2101   witch hazel-glycerin  (TUCKS) pad   Topical QID PRN Teresa Jes CROME, NP        Lab Results:  Results for  orders placed or performed during the hospital encounter of 12/06/23 (from the past 48 hours)  Hepatic function panel     Status: Abnormal   Collection Time: 12/25/23  6:47 AM  Result Value Ref Range   Total Protein 7.3 6.5 - 8.1 g/dL   Albumin 4.1 3.5 - 5.0 g/dL   AST 91 (H) 15 - 41 U/L   ALT 201 (H) 0 - 44 U/L   Alkaline Phosphatase 59 38 - 126 U/L   Total Bilirubin 0.7 0.0 - 1.2 mg/dL   Bilirubin, Direct 0.1 0.0 - 0.2 mg/dL   Indirect Bilirubin 0.6 0.3 - 0.9 mg/dL    Comment: Performed at [facility], [address] [place], [place] [address]    Blood Alcohol  level:  Lab Results  Component Value Date   ETH <10 12/04/2023   ETH <10 07/03/2022    Metabolic Disorder Labs: Lab Results  Component Value Date   HGBA1C 5.0 12/04/2023   MPG 96.8 12/04/2023   MPG 102.54 07/03/2022   Lab Results  Component Value Date   PROLACTIN 4.7 08/18/2023   PROLACTIN 30.0 (H) 07/29/2018   Lab Results  Component Value Date   CHOL 219 (H) 12/04/2023   TRIG 134 12/04/2023   HDL 50 12/04/2023   CHOLHDL 4.4 12/04/2023   VLDL 27 12/04/2023   LDLCALC 142 (H) 12/04/2023   LDLCALC 147 (H) 08/18/2023    Physical Findings: AIMS: Facial and Oral Movements Muscles of Facial Expression: None Lips and Perioral Area: None Jaw: None Tongue: None,Extremity Movements Upper (arms, wrists, hands, fingers): None Lower (legs, knees,  ankles, toes): None, Trunk Movements Neck, shoulders, hips: None, Global Judgements Severity of abnormal movements overall : None Incapacitation due to abnormal movements: None Patient's awareness of abnormal movements: No Awareness, Dental Status Current problems with teeth and/or dentures?: No Does patient usually wear dentures?: No  CIWA:    COWS:     No EPS on my exam today 12/26/2023  Musculoskeletal: Strength & Muscle Tone: within normal limits Gait & Station: normal Patient leans: N/A  Psychiatric Specialty Exam:  Presentation   General Appearance:  Casual  Eye Contact: Fair  Speech: Garbled; Normal Rate  Speech Volume: Normal  Handedness: Right   Mood and Affect  Mood: Anxious  Affect: Congruent; Constricted   Thought Process  Thought Processes: -- (tangentia)  Descriptions of Associations:Tangential  Orientation:Full (Time, Place and Person)  Thought Content:Logical  History of Schizophrenia/Schizoaffective disorder:No  Duration of Psychotic Symptoms:Less than six months  Hallucinations:Hallucinations: None  Ideas of Reference:Paranoia  Suicidal Thoughts:Suicidal Thoughts: No  Homicidal Thoughts:Homicidal Thoughts: No   Sensorium  Memory: Immediate Fair; Recent Fair; Remote Fair  Judgment: Fair  Insight: Lacking   Executive Functions  Concentration: Fair  Attention Span: Poor  Recall: [employer] of Knowledge: Fair  Language: Fair   Psychomotor Activity  Psychomotor Activity: Psychomotor Activity: Normal   Assets  Assets: Resilience; Social Support; Housing   Sleep  Sleep: Sleep: Fair    Physical Exam: Physical Exam Vitals reviewed.  Constitutional:      General: He is not in acute distress.    Appearance: He is not toxic-appearing.  Pulmonary:     Effort: Pulmonary effort is normal. No respiratory distress.  Neurological:     Mental Status: He is alert.     Motor: No weakness.     Gait: Gait normal.  Psychiatric:        Behavior: Behavior normal.    Review of Systems  Constitutional:  Negative for chills and fever.  Cardiovascular:  Negative for chest pain and palpitations.  Neurological:  Negative for dizziness, tingling, tremors and headaches.  Psychiatric/Behavioral:  Negative for depression, hallucinations, memory loss, substance abuse and suicidal ideas. The patient is nervous/anxious. The patient does not have insomnia.   All other systems reviewed and are negative.  Blood pressure 127/80, pulse 71, temperature 98.3 F  (36.8 C), temperature source Oral, resp. rate 20, height 5' 3 (1.6 m), weight 82.6 kg, SpO2 97%. Body mass index is 32.24 kg/m.   Treatment Plan Summary:  Daily contact with patient to assess and evaluate symptoms and progress in treatment and Medication management   Diagnoses / Active Problems: Schizoaffective disorder, bipolar type (HCC) Principal Problem:   Schizoaffective disorder, bipolar type (HCC) Active Problems:   Cannabis use disorder   Tobacco use disorder   Housing insecurity   Muscle spasm of back   Long term current use of antipsychotic medication     PLAN: Safety and Monitoring:             -- Involuntary admission to inpatient psychiatric unit for safety, stabilization and treatment             -- Daily contact with patient to assess and evaluate symptoms and progress in treatment             -- Patient's case to be discussed in multi-disciplinary team meeting             -- Observation Level : q15 minute checks             --  Vital signs: q12 hours             -- Precautions: suicide, elopement, and assault   2. Interventions (medications, psychoeducation, etc):    Elevated transaminase Sus 2/2 seroquel , others could be trazodone , and propranolol , all which has been dc'd and trending daily until downtrends - per med consult recs. See 12/16/2023 plan of care note of med consult recs. Hepatitis and ammonia wnl. Repeat CMP (for Na on trileptal ) and monitoring LFTs on tomorrow 1-5    Schizoaffective d/o-bipolar type -Previously discontinued Invega  12 mg daily (was partially effective) -Continue Prolixin  10 po tid for psychosis. Goal for LAI.  -Continue cogentin  1 mg po bid for eps. Pt reports having daily BM.  -Continued trileptal  300 mg Q 12 hrs for mood stabilization -Continued Restoril  15 mg nightly as needed, to replace trazodone                -- HTN: Continued Amlodipine  10 mg qPM, to replace Tylenol              -- NSAID PRN for msk back pain    DC'd - invega  - partial effect (worse off invega )  - seroquel  - elevated transaminase - propranolol  decreased clearance of transaminase      The risks/benefits/side-effects/alternatives to the above medication were discussed in detail with the patient and time was given for questions. The patient consents to medication trial. FDA black box warnings, if present, were discussed.   The patient is agreeable with the medication plan, as above. We will monitor the patient's response to pharmacologic treatment, and adjust medications as necessary.    4. Group Therapy:             -- Encouraged patient to participate in unit milieu and in scheduled group therapies              -- Short Term Goals: Ability to identify changes in lifestyle to reduce recurrence of condition, verbalize feelings, identify and develop effective coping behaviors, maintain clinical measurements within normal limits, and identify triggers associated with substance abuse/mental health issues will improve. Improvement in ability to demonstrate self-control and comply with prescribed medications.             -- Long Term Goals: Improvement in symptoms so as ready for discharge -- Patient is encouraged to participate in group therapy while admitted to the psychiatric unit. -- We will address other chronic and acute stressors, which contributed to the patient's Paranoid schizophrenia (HCC) in order to reduce the risk of self-harm at discharge.   5. Discharge Planning:              -- Social work and case management to assist with discharge planning and identification of hospital follow-up needs prior to discharge             -- Estimated LOS: 5-7 days             -- Discharge Concerns: Need to establish a safety plan; Medication compliance and effectiveness             -- Discharge Goals: Return home with outpatient referrals for mental health follow-up including medication management/psychotherapy  Rankin Montes,  MD 12/26/2023, 8:23 AM  Total Time Spent in Direct Patient Care:  I personally spent 30 minutes on the unit in direct patient care. The direct patient care time included face-to-face time with the patient, reviewing the patient's chart, communicating with other professionals, and coordinating care. Greater than 50% of this time  was spent in counseling or coordinating care with the patient regarding goals of hospitalization, psycho-education, and discharge planning needs.   Rankin Montes, MD Psychiatrist

## 2023-12-26 NOTE — Group Note (Signed)
 Date:  12/26/2023 Time:  8:49 PM  Group Topic/Focus:  Wrap-Up Group:   The focus of this group is to help patients review their daily goal of treatment and discuss progress on daily workbooks.    Participation Level:  Did Not Attend  Participation Quality:   Did Not Attend  Affect:   Did Not Attend  Cognitive:   Did Not Attend  Insight: None  Engagement in Group:   Did Not Attend  Modes of Intervention:   Did Not Attend  Additional Comments:  Pt was encouraged to attend wrap up group but did not attend.   Lonni Na 12/26/2023, 8:49 PM

## 2023-12-26 NOTE — Plan of Care (Signed)
   Problem: Education: Goal: Knowledge of [facility] Education information/materials will improve Outcome: Progressing Goal: Emotional status will improve Outcome: Progressing Goal: Mental status will improve Outcome: Progressing Goal: Verbalization of understanding the information provided will improve Outcome: Progressing   Problem: Activity: Goal: Interest or engagement in activities will improve Outcome: Progressing Goal: Sleeping patterns will improve Outcome: Progressing   Problem: Coping: Goal: Ability to verbalize frustrations and anger appropriately will improve Outcome: Progressing Goal: Ability to demonstrate self-control will improve Outcome: Progressing   Problem: Health Behavior/Discharge Planning: Goal: Identification of resources available to assist in meeting health care needs will improve Outcome: Progressing Goal: Compliance with treatment plan for underlying cause of condition will improve Outcome: Progressing   Problem: Physical Regulation: Goal: Ability to maintain clinical measurements within normal limits will improve Outcome: Progressing   Problem: Safety: Goal: Periods of time without injury will increase Outcome: Progressing   Problem: Activity: Goal: Will verbalize the importance of balancing activity with adequate rest periods Outcome: Progressing   Problem: Education: Goal: Will be free of psychotic symptoms Outcome: Progressing Goal: Knowledge of the prescribed therapeutic regimen will improve Outcome: Progressing   Problem: Coping: Goal: Coping ability will improve Outcome: Progressing Goal: Will verbalize feelings Outcome: Progressing   Problem: Health Behavior/Discharge Planning: Goal: Compliance with prescribed medication regimen will improve Outcome: Progressing   Problem: Nutritional: Goal: Ability to achieve adequate nutritional intake will improve Outcome: Progressing   Problem: Role Relationship: Goal:  Ability to communicate needs accurately will improve Outcome: Progressing Goal: Ability to interact with others will improve Outcome: Progressing   Problem: Safety: Goal: Ability to redirect hostility and anger into socially appropriate behaviors will improve Outcome: Progressing Goal: Ability to remain free from injury will improve Outcome: Progressing   Problem: Self-Care: Goal: Ability to participate in self-care as condition permits will improve Outcome: Progressing   Problem: Self-Concept: Goal: Will verbalize positive feelings about self Outcome: Progressing

## 2023-12-26 NOTE — Progress Notes (Signed)
   12/26/23 1200  Psych Admission Type (Psych Patients Only)  Admission Status Involuntary  Psychosocial Assessment  Patient Complaints Anxiety  Eye Contact Fair;Intense  Facial Expression Anxious  Affect Preoccupied  Speech Tangential  Interaction Assertive  Motor Activity Restless  Appearance/Hygiene Unremarkable  Behavior Characteristics Cooperative  Mood Preoccupied  Thought Process  Coherency Tangential  Content Blaming others  Delusions Paranoid  Perception WDL  Hallucination None reported or observed  Judgment Limited  Confusion None  Danger to Self  Current suicidal ideation? Denies  Danger to Others  Danger to Others None reported or observed

## 2023-12-26 NOTE — Progress Notes (Signed)
 Pt engaged in discussion readily at shift onset.  He rambled on and fixated on how the ACT team wants to run up his insurance and how he can't trust them.  Pt appeared paranoid and suspicious and stated, I ain't their slave.  Denied SI/HI/AVH.  No distress reported/observed overnight.   12/25/23 2000  Psych Admission Type (Psych Patients Only)  Admission Status Involuntary  Psychosocial Assessment  Patient Complaints Anxiety;Suspiciousness;Restlessness  Eye Contact Fair;Intense  Facial Expression Other (Comment) (Intense)  Affect Preoccupied  Speech Tangential  Interaction Assertive  Motor Activity Restless  Appearance/Hygiene Unremarkable  Behavior Characteristics Cooperative  Mood Preoccupied  Thought Process  Coherency Tangential  Content Blaming others  Delusions Paranoid  Perception WDL  Hallucination None reported or observed  Judgment Limited  Confusion None  Danger to Self  Current suicidal ideation? Denies  Danger to Others  Danger to Others None reported or observed

## 2023-12-26 NOTE — Progress Notes (Signed)
   12/26/23 2145  Psych Admission Type (Psych Patients Only)  Admission Status Involuntary  Psychosocial Assessment  Patient Complaints Anxiety  Eye Contact Fair  Facial Expression Anxious  Affect Preoccupied  Speech Tangential;Rhyming  Interaction Assertive  Motor Activity Restless  Appearance/Hygiene Unremarkable  Behavior Characteristics Cooperative  Mood Preoccupied  Aggressive Behavior  Effect No apparent injury  Thought Process  Coherency Tangential  Content Blaming others  Delusions Paranoid  Perception WDL  Hallucination None reported or observed  Judgment Limited  Confusion WDL  Danger to Self  Current suicidal ideation? Denies

## 2023-12-26 NOTE — Plan of Care (Signed)
  Problem: Education: Goal: Mental status will improve Outcome: Progressing   Problem: Activity: Goal: Interest or engagement in activities will improve Outcome: Progressing Goal: Sleeping patterns will improve Outcome: Progressing   Problem: Safety: Goal: Periods of time without injury will increase Outcome: Progressing

## 2023-12-27 DIAGNOSIS — F200 Paranoid schizophrenia: Secondary | ICD-10-CM | POA: Diagnosis not present

## 2023-12-27 LAB — COMPREHENSIVE METABOLIC PANEL
ALT: 141 U/L — ABNORMAL HIGH (ref 0–44)
AST: 57 U/L — ABNORMAL HIGH (ref 15–41)
Albumin: 4.1 g/dL (ref 3.5–5.0)
Alkaline Phosphatase: 64 U/L (ref 38–126)
Anion gap: 12 (ref 5–15)
BUN: 9 mg/dL (ref 6–20)
CO2: 24 mmol/L (ref 22–32)
Calcium: 9.8 mg/dL (ref 8.9–10.3)
Chloride: 100 mmol/L (ref 98–111)
Creatinine, Ser: 0.95 mg/dL (ref 0.61–1.24)
GFR, Estimated: >60 mL/min (ref >60)
Glucose, Bld: 88 mg/dL (ref 70–99)
Potassium: 4 mmol/L (ref 3.5–5.1)
Sodium: 136 mmol/L (ref 135–145)
Total Bilirubin: 0.5 mg/dL (ref 0.0–1.2)
Total Protein: 7.6 g/dL (ref 6.5–8.1)

## 2023-12-27 NOTE — Progress Notes (Signed)
 [facility] MD Progress Note  12/27/2023 2:43 PM Lucas Knapp  MRN:  [id number]  Subjective:    Lucas Knapp is a 38 y.o. male with past psychiatric history of schizoaffective d/o-bipolar type, cannabis use d/o, HTN, who presented as a walk-in with mom Lucas Knapp [id number]) [facility] (12/04/2023) then transferred Involuntary to [facility] (12/06/2023) for evaluation and management of increased agitation, auditory hallucinations, disorganized thinking and bizarre behavior.   PRNs overnight Restoril  15 mg 1x  Patient evaluated at bedside. Reports he slept like a baby. Reports appetite has been good. States mood is feeling like I'm in an active dream  today. He likens his hospitalization to a dream that never ends. He continues to make bizarre statements but is more linear and less pressured. On interview, suicidal ideations are not present . Homicidal ideations are not present.  Patient denies auditory or visual hallucinations.  Denies paranoid ideations.  Denies delusional thought processes.  Side effects to currently prescribed medications are none. Reports he is having some mild L sided chest discomfort that started yesterday and had forgotten to disclosed, believes he slept in a bad position. Denies SOB. Reports regular bowel movements.  During bed progression, RN reports patient has continued to complain of chronic back pain.   Principal Problem: Schizoaffective disorder, bipolar type (HCC) Diagnosis: Principal Problem:   Schizoaffective disorder, bipolar type (HCC) Active Problems:   Cannabis use disorder   Tobacco use disorder   Housing insecurity   Paranoid schizophrenia (HCC)   Muscle spasm of back   Long term current use of antipsychotic medication  Total Time spent with patient: 15 minutes  Past Psychiatric History:  Previous psych diagnoses: Schizoaffective disorder-bipolar type (previous differentials included schizophrenia, paranoid schizophrenia, bipolar 1  disorder), cannabis use disorder, tobacco use disorder Prior inpatient psychiatric treatment: Yes Prior outpatient psychiatric treatment: Haldol  5 mg qAM and 10 mg at night, Zyprexa  10 mg at bedtime, Abilify 10 mg daily,  Current psychiatric provider: Zane Bach, NP [facility]    Neuromodulation history: denies   Current therapist:  N/A   Psychotherapy hx:  N/A  History of suicide attempts: None per chart review  History of homicide: None per chart review      Past Medical History:  Past Medical History:  Diagnosis Date   Back pain    Bipolar 1 disorder (HCC)    Bipolar disease, manic (HCC) 06/08/2012   Cannabis use disorder 09/17/2012   Headache 06/08/2012   Hyperlipidemia    Hypertension    pt has been prescribed HCTZ 12.5 mg daily. Pt was 140/80 on admission.    Insomnia 05/02/2014   Long term current use of antipsychotic medication 12/16/2023   Schizoaffective disorder, bipolar type (HCC) 06/08/2012   Schizophrenia (HCC)    Traumatic myalgia 06/08/2012   History reviewed. No pertinent surgical history. Family History:  Family History  Problem Relation Age of Onset   Hypertension Father    Hyperlipidemia Father    Hypertension Paternal Grandmother    Hypertension Paternal Grandfather    Hypertension Brother        identical twin   Psychosis Brother        mental issues     Family Psychiatric  History: See H&P  Social History:  Social History   Substance and Sexual Activity  Alcohol  Use Yes   Alcohol /week: 4.0 standard drinks of alcohol    Types: 4 Cans of beer per week   Comment: occasional alcohol  use roughly 2x weekly, 1-2 drinks per event  Social History   Substance and Sexual Activity  Drug Use Yes   Types: Marijuana   Comment: Pt refused to comment on drug use    Social History   Socioeconomic History   Marital status: Single    Spouse name: Not on file   Number of children: Not on file   Years of education: Not on file    Highest education level: Not on file  Occupational History   Not on file  Tobacco Use   Smoking status: Every Day    Types: Cigarettes   Smokeless tobacco: Never   Tobacco comments:    10-15  Vaping Use   Vaping status: Not on file  Substance and Sexual Activity   Alcohol  use: Yes    Alcohol /week: 4.0 standard drinks of alcohol     Types: 4 Cans of beer per week    Comment: occasional alcohol  use roughly 2x weekly, 1-2 drinks per event   Drug use: Yes    Types: Marijuana    Comment: Pt refused to comment on drug use   Sexual activity: Not Currently  Other Topics Concern   Not on file  Social History Narrative   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 Also works for the [employer] one day a week.    Single   No children   Completed college (bachelors in Statistician)   Enjoys swimming, playing pool, walking   [place] .  Has identical twin and 2 other brothers in [place]      06/2022: Homeless      07/03/22: Pt says he lives alone and works but doesn't specify his job. Pt denies social support. Has psychiatric services with Laymon Bach, NP.   Social Drivers of Corporate Investment Banker Strain: Not on file  Food Insecurity: Patient Declined (12/06/2023)   Hunger Vital Sign    Worried About Running Out of Food in the Last Year: Patient declined    Ran Out of Food in the Last Year: Patient declined  Transportation Needs: Patient Declined (12/06/2023)   PRAPARE - Administrator, Civil Service (Medical): Patient declined    Lack of Transportation (Non-Medical): Patient declined  Physical Activity: Not on file  Stress: Not on file  Social Connections: Not on file   Additional Social History:                           Current Medications: Current Facility-Administered Medications  Medication Dose Route Frequency Provider Last Rate Last Admin   alum & mag hydroxide-simeth (MAALOX/MYLANTA) 200-200-20 MG/5ML  suspension 30 mL  30 mL Oral Q4H PRN White, Patrice L, NP   30 mL at 12/07/23 0326   amLODipine  (NORVASC ) tablet 10 mg  10 mg Oral QHS Nguyen, Julie, DO   10 mg at 12/26/23 2054   benztropine  (COGENTIN ) tablet 1 mg  1 mg Oral BID Nwoko, Agnes I, NP   1 mg at 12/27/23 9180   fluPHENAZine  (PROLIXIN ) tablet 10 mg  10 mg Oral Q8H Ntuen, Tina C, FNP   10 mg at 12/27/23 1353   hydrOXYzine  (ATARAX ) tablet 25 mg  25 mg Oral TID PRN White, Patrice L, NP   25 mg at 12/21/23 1415   ibuprofen  (ADVIL ) tablet 400 mg  400 mg Oral Q4H PRN Massengill, Rankin, MD   400 mg at 12/22/23 2007   magnesium  hydroxide (MILK OF MAGNESIA) suspension 30 mL  30 mL Oral Daily PRN White, Patrice L, NP       OLANZapine  (ZYPREXA ) injection 10 mg  10 mg Intramuscular TID PRN White, Patrice L, NP   10 mg at 12/06/23 2321   OLANZapine  (ZYPREXA ) injection 5 mg  5 mg Intramuscular TID PRN White, Patrice L, NP       OLANZapine  zydis (ZYPREXA ) disintegrating tablet 5 mg  5 mg Oral TID PRN White, Patrice L, NP   5 mg at 12/21/23 0341   Oxcarbazepine  (TRILEPTAL ) tablet 300 mg  300 mg Oral Q12H Nguyen, Julie, DO   300 mg at 12/27/23 9180   sodium chloride  (OCEAN) 0.65 % nasal spray 1 spray  1 spray Each Nare BID PRN Teresa Jes L, NP   1 spray at 12/25/23 9275   temazepam  (RESTORIL ) capsule 15 mg  15 mg Oral QHS PRN Nguyen, Julie, DO   15 mg at 12/26/23 2054   witch hazel-glycerin  (TUCKS) pad   Topical QID PRN Teresa Jes CROME, NP        Lab Results:  No results found for this or any previous visit (from the past 48 hours).   Blood Alcohol  level:  Lab Results  Component Value Date   ETH <10 12/04/2023   ETH <10 07/03/2022    Metabolic Disorder Labs: Lab Results  Component Value Date   HGBA1C 5.0 12/04/2023   MPG 96.8 12/04/2023   MPG 102.54 07/03/2022   Lab Results  Component Value Date   PROLACTIN 4.7 08/18/2023   PROLACTIN 30.0 (H) 07/29/2018   Lab Results  Component Value Date   CHOL 219 (H) 12/04/2023   TRIG  134 12/04/2023   HDL 50 12/04/2023   CHOLHDL 4.4 12/04/2023   VLDL 27 12/04/2023   LDLCALC 142 (H) 12/04/2023   LDLCALC 147 (H) 08/18/2023    Physical Findings: AIMS: Facial and Oral Movements Muscles of Facial Expression: None Lips and Perioral Area: None Jaw: None Tongue: None,Extremity Movements Upper (arms, wrists, hands, fingers): None Lower (legs, knees, ankles, toes): None, Trunk Movements Neck, shoulders, hips: None, Global Judgements Severity of abnormal movements overall : None Incapacitation due to abnormal movements: None Patient's awareness of abnormal movements: No Awareness, Dental Status Current problems with teeth and/or dentures?: No Does patient usually wear dentures?: No  CIWA:    COWS:     No EPS on my exam today 12/26/2023  Musculoskeletal: Strength & Muscle Tone: within normal limits Gait & Station: normal Patient leans: N/A  Psychiatric Specialty Exam:  Presentation  General Appearance:  Casual  Eye Contact: None  Speech: Normal rate, less hyperverbal  Speech Volume: Normal  Handedness: -- (not assessed)   Mood and Affect  Mood: Anxious  Affect: Flat   Thought Process  Thought Processes: Less disorganized, more linear  Descriptions of Associations:Loose  Orientation:Partial  Thought Content:Abstract Reasoning; Delusions; Illogical  History of Schizophrenia/Schizoaffective disorder:Yes  Duration of Psychotic Symptoms:Greater than six months  Hallucinations:Hallucinations: None  Ideas of Reference:Paranoia  Suicidal Thoughts:Suicidal Thoughts: No  Homicidal Thoughts:Homicidal Thoughts: No   Sensorium  Memory: Immediate Poor; Recent Poor; Remote Poor  Judgment: Poor  Insight: None   Executive Functions  Concentration: Poor  Attention Span: Poor  Recall: Poor  Fund of Knowledge: Poor  Language: Fair   Psychomotor Activity  Psychomotor Activity: Psychomotor Activity: Normal   Assets   Assets: Intimacy; Social Support; Talents/Skills   Sleep  Sleep: Sleep: Poor    Physical Exam: Physical Exam Vitals reviewed.  Constitutional:  General: He is not in acute distress.    Appearance: He is not toxic-appearing.  Pulmonary:     Effort: Pulmonary effort is normal. No respiratory distress.  Neurological:     Mental Status: He is alert.     Motor: No weakness.     Gait: Gait normal.  Psychiatric:        Behavior: Behavior normal.    Review of Systems  Constitutional:  Negative for chills and fever.  Cardiovascular:  Negative for chest pain and palpitations.  Neurological:  Negative for dizziness, tingling, tremors and headaches.  Psychiatric/Behavioral:  Negative for depression, hallucinations, memory loss, substance abuse and suicidal ideas. The patient is nervous/anxious. The patient does not have insomnia.   All other systems reviewed and are negative.  Blood pressure 120/76, pulse 72, temperature 98.7 F (37.1 C), temperature source Oral, resp. rate 18, height 5' 3 (1.6 m), weight 82.6 kg, SpO2 100%. Body mass index is 32.24 kg/m.   Treatment Plan Summary:  Daily contact with patient to assess and evaluate symptoms and progress in treatment and Medication management   Diagnoses / Active Problems: Schizoaffective disorder, bipolar type (HCC) Principal Problem:   Schizoaffective disorder, bipolar type (HCC) Active Problems:   Cannabis use disorder   Tobacco use disorder   Housing insecurity   Muscle spasm of back   Long term current use of antipsychotic medication     PLAN: Safety and Monitoring:             -- Involuntary admission to inpatient psychiatric unit for safety, stabilization and treatment             -- Daily contact with patient to assess and evaluate symptoms and progress in treatment             -- Patient's case to be discussed in multi-disciplinary team meeting             -- Observation Level : q15 minute checks              -- Vital signs: q12 hours             -- Precautions: suicide, elopement, and assault   2. Interventions (medications, psychoeducation, etc):    Elevated transaminase Sus 2/2 seroquel , others could be trazodone , and propranolol , all which has been dc'd and trending daily until downtrends - per med consult recs. See 12/16/2023 plan of care note of med consult recs. Hepatitis and ammonia wnl. Repeat CMP (for Na on trileptal ) and monitoring LFTs on tomorrow 1-5    Schizoaffective d/o-bipolar type -Previously discontinued Invega  12 mg daily (was partially effective) -Continue Prolixin  10 po tid for psychosis. Goal for LAI.  -Continue cogentin  1 mg po bid for eps. Pt reports having daily BM.  -Continued trileptal  300 mg Q 12 hrs for mood stabilization -Continued Restoril  15 mg nightly as needed, to replace trazodone                -- HTN: Continued Amlodipine  10 mg qPM, to replace Tylenol              -- NSAID PRN for msk back pain   DC'd - invega  - partial effect (worse off invega )  - seroquel  - elevated transaminase - propranolol  decreased clearance of transaminase      The risks/benefits/side-effects/alternatives to the above medication were discussed in detail with the patient and time was given for questions. The patient consents to medication trial. FDA black box warnings, if present, were discussed.  The patient is agreeable with the medication plan, as above. We will monitor the patient's response to pharmacologic treatment, and adjust medications as necessary.    4. Group Therapy:             -- Encouraged patient to participate in unit milieu and in scheduled group therapies              -- Short Term Goals: Ability to identify changes in lifestyle to reduce recurrence of condition, verbalize feelings, identify and develop effective coping behaviors, maintain clinical measurements within normal limits, and identify triggers associated with substance abuse/mental health issues  will improve. Improvement in ability to demonstrate self-control and comply with prescribed medications.             -- Long Term Goals: Improvement in symptoms so as ready for discharge -- Patient is encouraged to participate in group therapy while admitted to the psychiatric unit. -- We will address other chronic and acute stressors, which contributed to the patient's Paranoid schizophrenia (HCC) in order to reduce the risk of self-harm at discharge.   5. Discharge Planning:              -- Social work and case management to assist with discharge planning and identification of hospital follow-up needs prior to discharge Per social work, ACT team visited 12/25/2023 and would like to meet with the patient and his family again.  Patient's mother and father and brother can come to [facility] on next Thursday, to meet with the patient and ACT team at the same time.  I discussed with the patient that if that meeting goes well, hopefully we can discharge him after that meeting on next Thursday 1-9.              -- Estimated LOS: 5-7 days             -- Discharge Concerns: Need to establish a safety plan; Medication compliance and effectiveness             -- Discharge Goals: Return home with outpatient referrals for mental health follow-up including medication management/psychotherapy  Marlo Masson, MD 12/27/2023, 2:43 PM

## 2023-12-27 NOTE — Plan of Care (Signed)
   Problem: Education: Goal: Knowledge of [facility] Education information/materials will improve Outcome: Progressing Goal: Emotional status will improve Outcome: Progressing Goal: Mental status will improve Outcome: Progressing Goal: Verbalization of understanding the information provided will improve Outcome: Progressing   Problem: Activity: Goal: Interest or engagement in activities will improve Outcome: Progressing Goal: Sleeping patterns will improve Outcome: Progressing   Problem: Coping: Goal: Ability to verbalize frustrations and anger appropriately will improve Outcome: Progressing Goal: Ability to demonstrate self-control will improve Outcome: Progressing   Problem: Health Behavior/Discharge Planning: Goal: Identification of resources available to assist in meeting health care needs will improve Outcome: Progressing Goal: Compliance with treatment plan for underlying cause of condition will improve Outcome: Progressing   Problem: Physical Regulation: Goal: Ability to maintain clinical measurements within normal limits will improve Outcome: Progressing   Problem: Safety: Goal: Periods of time without injury will increase Outcome: Progressing   Problem: Activity: Goal: Will verbalize the importance of balancing activity with adequate rest periods Outcome: Progressing   Problem: Education: Goal: Will be free of psychotic symptoms Outcome: Progressing Goal: Knowledge of the prescribed therapeutic regimen will improve Outcome: Progressing   Problem: Coping: Goal: Coping ability will improve Outcome: Progressing Goal: Will verbalize feelings Outcome: Progressing   Problem: Health Behavior/Discharge Planning: Goal: Compliance with prescribed medication regimen will improve Outcome: Progressing   Problem: Nutritional: Goal: Ability to achieve adequate nutritional intake will improve Outcome: Progressing   Problem: Role Relationship: Goal:  Ability to communicate needs accurately will improve Outcome: Progressing Goal: Ability to interact with others will improve Outcome: Progressing   Problem: Safety: Goal: Ability to redirect hostility and anger into socially appropriate behaviors will improve Outcome: Progressing Goal: Ability to remain free from injury will improve Outcome: Progressing   Problem: Self-Care: Goal: Ability to participate in self-care as condition permits will improve Outcome: Progressing   Problem: Self-Concept: Goal: Will verbalize positive feelings about self Outcome: Progressing

## 2023-12-27 NOTE — Progress Notes (Signed)
   12/27/23 0900  Psych Admission Type (Psych Patients Only)  Admission Status Involuntary  Psychosocial Assessment  Patient Complaints Anxiety;Suspiciousness  Eye Contact Fair;Intense  Facial Expression Anxious  Affect Preoccupied  Child Psychotherapist Assertive  Motor Activity Fidgety;Restless  Appearance/Hygiene Unremarkable  Behavior Characteristics Cooperative  Mood Preoccupied  Thought Process  Coherency Tangential  Content Blaming others  Delusions Paranoid  Perception WDL  Hallucination None reported or observed  Judgment Limited  Confusion None  Danger to Self  Current suicidal ideation? Denies  Danger to Others  Danger to Others None reported or observed   Pt tangential this morning, noted on phone hyperverbal and asking person on other end to bail him out of jail or put money on his books. Pt medication compliant, complains of back pain. Pt declines medication when offred, utilizing heat packs per his request.

## 2023-12-27 NOTE — Group Note (Signed)
 Date:  12/27/2023 Time:  8:59 PM  Group Topic/Focus:  Wrap-Up Group:   The focus of this group is to help patients review their daily goal of treatment and discuss progress on daily workbooks.    Participation Level:  Did Not Attend  Participation Quality:  Resistant  Affect:  Resistant  Cognitive:  Lacking  Insight: Lacking  Engagement in Group:  Resistant  Modes of Intervention:  Discussion  Additional Comments:  Patient did not participate in wrap up group  Lucas Knapp 12/27/2023, 8:59 PM

## 2023-12-28 DIAGNOSIS — F200 Paranoid schizophrenia: Secondary | ICD-10-CM | POA: Diagnosis not present

## 2023-12-28 MED ORDER — FLUPHENAZINE HCL 5 MG PO TABS
5.0000 mg | ORAL_TABLET | Freq: Every day | ORAL | Status: DC
Start: 2023-12-29 — End: 2023-12-31
  Administered 2023-12-29 – 2023-12-31 (×3): 5 mg via ORAL
  Filled 2023-12-28 (×4): qty 1

## 2023-12-28 MED ORDER — FLUPHENAZINE HCL 5 MG PO TABS
5.0000 mg | ORAL_TABLET | Freq: Every day | ORAL | Status: DC
  Administered 2023-12-29 – 2023-12-31 (×3): 5 mg via ORAL
  Filled 2023-12-28 (×5): qty 1

## 2023-12-28 MED ORDER — FLUPHENAZINE HCL 10 MG PO TABS
10.0000 mg | ORAL_TABLET | Freq: Every day | ORAL | Status: DC
  Administered 2023-12-28 – 2023-12-30 (×3): 10 mg via ORAL
  Filled 2023-12-28 (×3): qty 1
  Filled 2023-12-28: qty 2
  Filled 2023-12-28 (×2): qty 1

## 2023-12-28 NOTE — Progress Notes (Signed)
 Writer had to ask patient at least 3 times to come take his medications. He came to med window and was complaining of how the medication has him sleeping all the time and he won't be able to work if he is sleepy all the time. He did not attend group because he was asleep even when he was asked to attend. He was encouraged to get a snack before returning to his room. Writer encouraged him to speak to his doctor about his concerns with his medications. Safety maintained with 15 min checks.

## 2023-12-28 NOTE — Plan of Care (Signed)
  Problem: Role Relationship: Goal: Ability to communicate needs accurately will improve Outcome: Progressing   Problem: Safety: Goal: Ability to remain free from injury will improve Outcome: Progressing

## 2023-12-28 NOTE — Group Note (Signed)
 Date:  12/28/2023 Time:  8:46 PM  Group Topic/Focus:  Wrap-Up Group:   The focus of this group is to help patients review their daily goal of treatment and discuss progress on daily workbooks.    Participation Level:  Did Not Attend  Participation Quality:   Did Not Attend  Affect:   Did Not Attend  Cognitive:   Did Not Attend  Insight: None  Engagement in Group:   Did Not Attend  Modes of Intervention:   Did Not Attend  Additional Comments:  Pt was encouraged to attend wrap up group but did not attend.   Lonni Na 12/28/2023, 8:46 PM

## 2023-12-28 NOTE — Progress Notes (Signed)
   12/28/23 2000  Psych Admission Type (Psych Patients Only)  Admission Status Involuntary  Psychosocial Assessment  Patient Complaints Worrying  Eye Contact Fair  Facial Expression Anxious  Affect Preoccupied  Speech Tangential  Interaction Assertive  Motor Activity Restless  Appearance/Hygiene Unremarkable  Behavior Characteristics Cooperative;Appropriate to situation  Mood Preoccupied;Pleasant  Aggressive Behavior  Effect No apparent injury  Thought Process  Coherency Tangential  Content Paranoia;Preoccupation  Delusions Paranoid  Perception WDL  Hallucination None reported or observed  Judgment Limited  Confusion WDL  Danger to Self  Current suicidal ideation? Denies

## 2023-12-28 NOTE — Progress Notes (Signed)
 [facility] MD Progress Note  12/28/2023 4:28 PM Lucas Knapp  MRN:  [id number]  Subjective:    Lucas Knapp is a 38 y.o. male with past psychiatric history of schizoaffective d/o-bipolar type, cannabis use d/o, HTN, who presented as a walk-in with mom [name] [phone]) [facility] (12/04/2023) then transferred Involuntary to [facility] (12/06/2023) for evaluation and management of increased agitation, auditory hallucinations, disorganized thinking and bizarre behavior.   On assessment today, the pt is more logical and linear.  His thoughts and speech are more clear.  He is more rational.  We discussed decreasing his Prolixin  dose in the morning and in the afternoon to address daytime sedation.  He is open to LAI if he feels less sedated throughout the day.  Otherwise he denies any AH or VH.  Reports that anxiety is less.  Reports that sleep is is fine.  Denies any SI or HI.  Overall his level of disorganization and bizarre thinking is significantly less over the last 1 to 2 days.  Aside from the sedation complaints, he denies other side effects to current psychiatric medications.    Principal Problem: Schizoaffective disorder, bipolar type (HCC) Diagnosis: Principal Problem:   Schizoaffective disorder, bipolar type (HCC) Active Problems:   Cannabis use disorder   Tobacco use disorder   Housing insecurity   Paranoid schizophrenia (HCC)   Muscle spasm of back   Long term current use of antipsychotic medication  Total Time spent with patient: 20 minutes  Past Psychiatric History:  Previous psych diagnoses: Schizoaffective disorder-bipolar type (previous differentials included schizophrenia, paranoid schizophrenia, bipolar 1 disorder), cannabis use disorder, tobacco use disorder Prior inpatient psychiatric treatment: Yes Prior outpatient psychiatric treatment: Haldol  5 mg qAM and 10 mg at night, Zyprexa  10 mg at bedtime, Abilify 10 mg daily,  Current psychiatric provider: Zane Bach, NP [facility]    Neuromodulation history: denies  Past Medical History:  Past Medical History:  Diagnosis Date   Back pain    Bipolar 1 disorder (HCC)    Bipolar disease, manic (HCC) 06/08/2012   Cannabis use disorder 09/17/2012   Headache 06/08/2012   Hyperlipidemia    Hypertension    pt has been prescribed HCTZ 12.5 mg daily. Pt was 140/80 on admission.    Insomnia 05/02/2014   Long term current use of antipsychotic medication 12/16/2023   Schizoaffective disorder, bipolar type (HCC) 06/08/2012   Schizophrenia (HCC)    Traumatic myalgia 06/08/2012   History reviewed. No pertinent surgical history. Family History:  Family History  Problem Relation Age of Onset   Hypertension Father    Hyperlipidemia Father    Hypertension Paternal Grandmother    Hypertension Paternal Grandfather    Hypertension Brother        identical twin   Psychosis Brother        mental issues     Family Psychiatric  History: See H&P Social History:  Social History   Substance and Sexual Activity  Alcohol  Use Yes   Alcohol /week: 4.0 standard drinks of alcohol    Types: 4 Cans of beer per week   Comment: occasional alcohol  use roughly 2x weekly, 1-2 drinks per event     Social History   Substance and Sexual Activity  Drug Use Yes   Types: Marijuana   Comment: Pt refused to comment on drug use    Social History   Socioeconomic History   Marital status: Single    Spouse name: Not on file   Number of children: Not  on file   Years of education: Not on file   Highest education level: Not on file  Occupational History   Not on file  Tobacco Use   Smoking status: Every Day    Types: Cigarettes   Smokeless tobacco: Never   Tobacco comments:    10-15  Vaping Use   Vaping status: Not on file  Substance and Sexual Activity   Alcohol  use: Yes    Alcohol /week: 4.0 standard drinks of alcohol     Types: 4 Cans of beer per week    Comment: occasional alcohol  use roughly  2x weekly, 1-2 drinks per event   Drug use: Yes    Types: Marijuana    Comment: Pt refused to comment on drug use   Sexual activity: Not Currently  Other Topics Concern   Not on file  Social History Narrative   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 Also works for the [employer] one day a week.    Single   No children   Completed college (bachelors in Statistician)   Enjoys swimming, playing pool, walking   [place] .  Has identical twin and 2 other brothers in [place]      06/2022: Homeless      07/03/22: Pt says he lives alone and works but doesn't specify his job. Pt denies social support. Has psychiatric services with Laymon Bach, NP.   Social Drivers of Corporate Investment Banker Strain: Not on file  Food Insecurity: Patient Declined (12/06/2023)   Hunger Vital Sign    Worried About Running Out of Food in the Last Year: Patient declined    Ran Out of Food in the Last Year: Patient declined  Transportation Needs: Patient Declined (12/06/2023)   PRAPARE - Administrator, Civil Service (Medical): Patient declined    Lack of Transportation (Non-Medical): Patient declined  Physical Activity: Not on file  Stress: Not on file  Social Connections: Not on file   Additional Social History:                           Current Medications: Current Facility-Administered Medications  Medication Dose Route Frequency Provider Last Rate Last Admin   alum & mag hydroxide-simeth (MAALOX/MYLANTA) 200-200-20 MG/5ML suspension 30 mL  30 mL Oral Q4H PRN White, Patrice L, NP   30 mL at 12/07/23 0326   amLODipine  (NORVASC ) tablet 10 mg  10 mg Oral QHS Nguyen, Julie, DO   10 mg at 12/27/23 2238   benztropine  (COGENTIN ) tablet 1 mg  1 mg Oral BID Nwoko, Agnes I, NP   1 mg at 12/28/23 9196   fluPHENAZine  (PROLIXIN ) tablet 10 mg  10 mg Oral QHS Bekka Qian, Rankin, MD       NOREEN ON 12/29/2023] fluPHENAZine  (PROLIXIN ) tablet 5 mg  5 mg Oral Daily  Rachel Samples, Rankin, MD       NOREEN ON 12/29/2023] fluPHENAZine  (PROLIXIN ) tablet 5 mg  5 mg Oral Daily Marlen Mollica, MD       hydrOXYzine  (ATARAX ) tablet 25 mg  25 mg Oral TID PRN White, Patrice L, NP   25 mg at 12/21/23 1415   ibuprofen  (ADVIL ) tablet 400 mg  400 mg Oral Q4H PRN Brittany Amirault, Rankin, MD   400 mg at 12/22/23 2007   magnesium  hydroxide (MILK OF MAGNESIA) suspension 30 mL  30 mL Oral Daily PRN Teresa Wyline CROME, NP  OLANZapine  (ZYPREXA ) injection 10 mg  10 mg Intramuscular TID PRN White, Patrice L, NP   10 mg at 12/06/23 2321   OLANZapine  (ZYPREXA ) injection 5 mg  5 mg Intramuscular TID PRN White, Patrice L, NP       OLANZapine  zydis (ZYPREXA ) disintegrating tablet 5 mg  5 mg Oral TID PRN White, Patrice L, NP   5 mg at 12/21/23 0341   Oxcarbazepine  (TRILEPTAL ) tablet 300 mg  300 mg Oral Q12H Nguyen, Julie, DO   300 mg at 12/28/23 9196   sodium chloride  (OCEAN) 0.65 % nasal spray 1 spray  1 spray Each Nare BID PRN Teresa Jes L, NP   1 spray at 12/28/23 0805   temazepam  (RESTORIL ) capsule 15 mg  15 mg Oral QHS PRN Nguyen, Julie, DO   15 mg at 12/26/23 2054   witch hazel-glycerin  (TUCKS) pad   Topical QID PRN Teresa Jes CROME, NP        Lab Results:  Results for orders placed or performed during the hospital encounter of 12/06/23 (from the past 48 hours)  Comprehensive metabolic panel     Status: Abnormal   Collection Time: 12/27/23  6:30 PM  Result Value Ref Range   Sodium 136 135 - 145 mmol/L   Potassium 4.0 3.5 - 5.1 mmol/L   Chloride 100 98 - 111 mmol/L   CO2 24 22 - 32 mmol/L   Glucose, Bld 88 70 - 99 mg/dL    Comment: Glucose reference range applies only to samples taken after fasting for at least 8 hours.   BUN 9 6 - 20 mg/dL   Creatinine, Ser 9.04 0.61 - 1.24 mg/dL   Calcium 9.8 8.9 - 89.6 mg/dL   Total Protein 7.6 6.5 - 8.1 g/dL   Albumin 4.1 3.5 - 5.0 g/dL   AST 57 (H) 15 - 41 U/L   ALT 141 (H) 0 - 44 U/L   Alkaline Phosphatase 64 38 - 126 U/L   Total  Bilirubin 0.5 0.0 - 1.2 mg/dL   GFR, Estimated >39 >39 mL/min    Comment: (NOTE) Calculated using the CKD-EPI Creatinine Equation (2021)    Anion gap 12 5 - 15    Comment: Performed at [facility], [address] [place], [place] [address]    Blood Alcohol  level:  Lab Results  Component Value Date   ETH <10 12/04/2023   ETH <10 07/03/2022    Metabolic Disorder Labs: Lab Results  Component Value Date   HGBA1C 5.0 12/04/2023   MPG 96.8 12/04/2023   MPG 102.54 07/03/2022   Lab Results  Component Value Date   PROLACTIN 4.7 08/18/2023   PROLACTIN 30.0 (H) 07/29/2018   Lab Results  Component Value Date   CHOL 219 (H) 12/04/2023   TRIG 134 12/04/2023   HDL 50 12/04/2023   CHOLHDL 4.4 12/04/2023   VLDL 27 12/04/2023   LDLCALC 142 (H) 12/04/2023   LDLCALC 147 (H) 08/18/2023    Physical Findings: AIMS: Facial and Oral Movements Muscles of Facial Expression: None Lips and Perioral Area: None Jaw: None Tongue: None,Extremity Movements Upper (arms, wrists, hands, fingers): None Lower (legs, knees, ankles, toes): None, Trunk Movements Neck, shoulders, hips: None, Global Judgements Severity of abnormal movements overall : None Incapacitation due to abnormal movements: None Patient's awareness of abnormal movements: No Awareness, Dental Status Current problems with teeth and/or dentures?: No Does patient usually wear dentures?: No  CIWA:    COWS:     Musculoskeletal: Strength &  Muscle Tone: within normal limits Gait & Station: normal Patient leans: N/A  Psychiatric Specialty Exam:  Presentation  General Appearance:  Casual  Eye Contact: None  Speech: Normal Rate (hyperverbal, garbled at times)  Speech Volume: Normal  Handedness: -- (not assessed)   Mood and Affect  Mood: Anxious  Affect: Flat   Thought Process  Thought Processes: Irrevelant; Disorganized  Descriptions of  Associations:Loose  Orientation:Partial  Thought Content:Abstract Reasoning; Delusions; Illogical  History of Schizophrenia/Schizoaffective disorder:Yes  Duration of Psychotic Symptoms:Greater than six months  Hallucinations:No data recorded Ideas of Reference:Paranoia  Suicidal Thoughts:No data recorded Homicidal Thoughts:No data recorded  Sensorium  Memory: Immediate Poor; Recent Poor; Remote Poor  Judgment: Poor  Insight: None   Executive Functions  Concentration: Poor  Attention Span: Poor  Recall: Poor  Fund of Knowledge: Poor  Language: Fair   Psychomotor Activity  Psychomotor Activity:No data recorded  Assets  Assets: Intimacy; Social Support; Talents/Skills   Sleep  Sleep:No data recorded   Physical Exam: Physical Exam Vitals reviewed.  Constitutional:      General: He is not in acute distress.    Appearance: He is normal weight. He is not toxic-appearing.  Pulmonary:     Effort: Pulmonary effort is normal. No respiratory distress.  Neurological:     Mental Status: He is alert.     Motor: No weakness.     Gait: Gait normal.  Psychiatric:        Behavior: Behavior normal.    Review of Systems  Constitutional:  Negative for chills and fever.  Cardiovascular:  Negative for chest pain and palpitations.  Neurological:  Negative for dizziness, tingling, tremors and headaches.  Psychiatric/Behavioral:  Negative for depression, hallucinations, memory loss, substance abuse and suicidal ideas. The patient is nervous/anxious. The patient does not have insomnia.   All other systems reviewed and are negative.  Blood pressure (!) 127/90, pulse 64, temperature 98.7 F (37.1 C), temperature source Oral, resp. rate 18, height 5' 3 (1.6 m), weight 82.6 kg, SpO2 98%. Body mass index is 32.24 kg/m.   Treatment Plan Summary: Daily contact with patient to assess and evaluate symptoms and progress in treatment and Medication  management   Diagnoses / Active Problems: Schizoaffective disorder, bipolar type (HCC) Principal Problem:   Schizoaffective disorder, bipolar type (HCC) Active Problems:   Cannabis use disorder   Tobacco use disorder   Housing insecurity   Muscle spasm of back   Long term current use of antipsychotic medication     PLAN: Safety and Monitoring:             -- Involuntary admission to inpatient psychiatric unit for safety, stabilization and treatment             -- Daily contact with patient to assess and evaluate symptoms and progress in treatment             -- Patient's case to be discussed in multi-disciplinary team meeting             -- Observation Level : q15 minute checks             -- Vital signs: q12 hours             -- Precautions: suicide, elopement, and assault   2. Interventions (medications, psychoeducation, etc):    Elevated transaminase -improving on labs from 1-5 Sus 2/2 seroquel , others could be trazodone , and propranolol , all which has been dc'd and trending daily until downtrends - per med consult  recs. See 12/16/2023 plan of care note of med consult recs. Hepatitis and ammonia wnl.    Schizoaffective d/o-bipolar type -Previously discontinued Invega  12 mg daily (was partially effective) -Decrease Prolixin  from 10 tid -> to 7.5 mg qam, 7.5 mg q afternoon, and 10 mg at bedtime - for psychosis. Goal for LAI.  -Continue cogentin  1 mg po bid for eps. Pt reports having daily BM.  -Continued trileptal  300 mg Q 12 hrs for mood stabilization -Continued Restoril  15 mg nightly as needed, to replace trazodone                -- HTN: Continued Amlodipine  10 mg qPM, to replace Tylenol              -- NSAID PRN for msk back pain   DC'd - invega  - partial effect (worse off invega )  - seroquel  - elevated transaminase - propranolol  decreased clearance of transaminase       The risks/benefits/side-effects/alternatives to the above medication were discussed in detail  with the patient and time was given for questions. The patient consents to medication trial. FDA black box warnings, if present, were discussed.   The patient is agreeable with the medication plan, as above. We will monitor the patient's response to pharmacologic treatment, and adjust medications as necessary.     4. Group Therapy:             -- Encouraged patient to participate in unit milieu and in scheduled group therapies              -- Short Term Goals: Ability to identify changes in lifestyle to reduce recurrence of condition, verbalize feelings, identify and develop effective coping behaviors, maintain clinical measurements within normal limits, and identify triggers associated with substance abuse/mental health issues will improve. Improvement in ability to demonstrate self-control and comply with prescribed medications.             -- Long Term Goals: Improvement in symptoms so as ready for discharge -- Patient is encouraged to participate in group therapy while admitted to the psychiatric unit. -- We will address other chronic and acute stressors, which contributed to the patient's Paranoid schizophrenia (HCC) in order to reduce the risk of self-harm at discharge.   5. Discharge Planning:              -- Social work and case management to assist with discharge planning and identification of hospital follow-up needs prior to discharge Per social work, ACT team visited 12/25/2023 and would like to meet with the patient and his family again.  Patient's mother and father and brother can come to [facility] on next Thursday, to meet with the patient and ACT team at the same time.  I discussed with the patient that if that meeting goes well, hopefully we can discharge him after that meeting on next Thursday 1-9.                          -- Discharge Concerns: Need to establish a safety plan; Medication compliance and effectiveness             -- Discharge Goals: Return home with outpatient  referrals for mental health follow-up including medication management/psychotherapy   Rankin Montes, MD 12/28/2023, 4:28 PM   Total Time Spent in Direct Patient Care:  I personally spent 35 minutes on the unit in direct patient care. The direct patient care time included face-to-face time with the patient, reviewing the  patient's chart, communicating with other professionals, and coordinating care. Greater than 50% of this time was spent in counseling or coordinating care with the patient regarding goals of hospitalization, psycho-education, and discharge planning needs.   Rankin Montes, MD Psychiatrist

## 2023-12-28 NOTE — Progress Notes (Signed)
 Pt complained of being sleepy during the day , educated pt on sleep medication , that is scheduled as PRN and decided to hold for this evening and if he needs the Restoril before 2 am he will come to staff and he could take it.

## 2023-12-28 NOTE — Plan of Care (Signed)
°  Problem: Coping: Goal: Ability to verbalize frustrations and anger appropriately will improve Outcome: Progressing Goal: Ability to demonstrate self-control will improve Outcome: Progressing   Problem: Physical Regulation: Goal: Ability to maintain clinical measurements within normal limits will improve Outcome: Progressing   Problem: Safety: Goal: Periods of time without injury will increase Outcome: Progressing

## 2023-12-28 NOTE — Plan of Care (Signed)
   Problem: Education: Goal: Emotional status will improve Outcome: Progressing Goal: Mental status will improve Outcome: Progressing   Problem: Activity: Goal: Interest or engagement in activities will improve Outcome: Progressing Goal: Sleeping patterns will improve Outcome: Progressing   Problem: Safety: Goal: Periods of time without injury will increase Outcome: Progressing

## 2023-12-28 NOTE — Group Note (Signed)
 [facility] LCSW Group Therapy Note   Group Date: 12/28/2023 Start Time: 1300 End Time: 1400   Type of Therapy/Topic:  Group Therapy:  Emotion Regulation  Due to the acuity and complex discharge plans, group was not held. Patient was provided therapeutic worksheets and asked to meet with CSW as needed.   Jenkins LULLA Primer, LCSWA

## 2023-12-28 NOTE — Group Note (Signed)
 Recreation Therapy Group Note   Group Topic:Coping Skills  Group Date: 12/28/2023 Start Time: 1020 End Time: 1058 Facilitators: Sindy Mccune-McCall, LRT,CTRS Location: [address] Dayroom   Group Topic: Coping Skills   Goal Area(s) Addresses: Patient will define what a coping skill is. Patient will successfully identify positive coping skills they can use post d/c.  Patient will acknowledge benefit(s) of using learned coping skills post d/c.   Group Description: Coping A to Z. Patient asked to identify what a coping skill is and when they use them. Patients with clinical research associate discussed healthy versus unhealthy coping skills. Next patients were given a blank worksheet titled Coping Skills A-Z. Patients were instructed to come up with at least three positive coping skill addressing the 8 challenges identified by the group (ex: stress, anger, anxiety, depression, drama, finances, work and family/friends). Patients were given 15 minutes to brainstorm before ideas were presented to the large group. Patients and LRT debriefed on the importance of coping skill selection based on situation and back-up plans when a skill tried is not effective. At the end of group, patients were given an handout of alphabetized strategies to keep for future reference.   Education: Pharmacologist, Scientist, Physiological, Discharge Planning.    Education Outcome: Acknowledges education/Verbalizes understanding/In group clarification offered/Additional education needed   Affect/Mood: Flat   Participation Level: Moderate   Participation Quality: Independent   Behavior: Cooperative   Speech/Thought Process: Coherent and Relevant   Insight: Fair   Judgement: Fair    Modes of Intervention: Worksheet   Patient Response to Interventions:  Receptive   Education Outcome:  In group clarification offered    Clinical Observations/Individualized Feedback: Pt was appropriate and participated in group. Pt was flat. Pt was able  to share his use of positive or negative coping skills would depend on the situation. Pt identified stress, drama and anxiety as challenges to come up with coping skills for. Pt left group early and did not return stating his medication was bothering him, giving him and headache and tired.     Plan: Continue to engage patient in RT group sessions 2-3x/week.   Yuri Fana-McCall, LRT,CTRS  12/28/2023 1:21 PM

## 2023-12-28 NOTE — Progress Notes (Signed)
 Patient rated his anxiety level 0/10 and his depression level 0/10 with 10 being the highest and 0 none. Medication and group compliant. Patient observed interacting well with peers. Appetite good on shift. Safety maintained.  12/28/23 0840  Psych Admission Type (Psych Patients Only)  Admission Status Involuntary  Psychosocial Assessment  Patient Complaints Other (Comment) (C/o sleeping too much during the day)  Eye Contact Fair;Intense  Facial Expression Anxious  Affect Preoccupied  Speech Tangential  Interaction Assertive  Motor Activity Restless  Appearance/Hygiene Unremarkable  Behavior Characteristics Cooperative;Appropriate to situation  Mood Preoccupied;Pleasant  Thought Process  Coherency Tangential  Content Preoccupation  Delusions Paranoid  Perception WDL  Hallucination None reported or observed  Judgment Limited  Confusion None  Danger to Self  Current suicidal ideation? Denies  Agreement Not to Harm Self Yes  Description of Agreement Verbal  Danger to Others  Danger to Others None reported or observed

## 2023-12-29 DIAGNOSIS — F250 Schizoaffective disorder, bipolar type: Secondary | ICD-10-CM | POA: Diagnosis not present

## 2023-12-29 NOTE — Plan of Care (Signed)
  Problem: Activity: Goal: Interest or engagement in activities will improve Outcome: Progressing Goal: Sleeping patterns will improve Outcome: Progressing   Problem: Coping: Goal: Ability to verbalize frustrations and anger appropriately will improve Outcome: Progressing Goal: Ability to demonstrate self-control will improve Outcome: Progressing   Problem: Physical Regulation: Goal: Ability to maintain clinical measurements within normal limits will improve Outcome: Progressing   Problem: Safety: Goal: Periods of time without injury will increase Outcome: Progressing
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
## 2023-12-29 NOTE — Progress Notes (Signed)
   12/29/23 2315  Psych Admission Type (Psych Patients Only)  Admission Status Involuntary  Psychosocial Assessment  Patient Complaints Worrying  Eye Contact Fair  Facial Expression Anxious  Affect Preoccupied  Speech Tangential  Interaction Assertive  Motor Activity Restless  Appearance/Hygiene Unremarkable  Behavior Characteristics Cooperative  Mood Anxious  Aggressive Behavior  Effect No apparent injury  Thought Process  Coherency Tangential  Content Paranoia;Preoccupation  Delusions Paranoid  Perception WDL  Hallucination None reported or observed  Judgment Limited  Confusion WDL  Danger to Self  Current suicidal ideation? Denies

## 2023-12-29 NOTE — Progress Notes (Signed)
 CSW spoke with pt's parents in regards ACTT services, scheduled appt on 12/31/23 at 2:00 pm and  disposition. Pt's mother reported pt does not want ACTT because they are not going to help him pay his bills and will take away time from him getting a job. Pt's mother reported they will not attend appt due to pt refusing services. CSW shared with pt's mother CSW will have another conversation with pt regarding the benefits. CSW requested pt to think about benefits and we will revisit on the following day. Pt in agreement, will follow up.

## 2023-12-29 NOTE — Group Note (Signed)
 Recreation Therapy Group Note   Group Topic:Self-Esteem  Group Date: 12/29/2023 Start Time: 1006 End Time: 1043 Facilitators: Yachet Mattson-McCall, LRT,CTRS Location: [address] Day[address]   Group Topic: Self-Esteem  Goal Area(s) Addresses:  Patient will successfully identify positive attributes about themselves.  Patient will identify healthy ways to increase self-esteem. Patient will acknowledge benefit(s) of improved self-esteem.   Intervention: Worksheet, Theme Park Manager, Music  Group Description: LRT and patients discussed the definition of self esteem and it's role in how we see ourselves. Patients were then given a worksheet with four mirrors. Patients were to identify things that describe, what they think about and how they present themselves. Patients would then share their findings with the group.  Education:  Self-Esteem, Discharge Planning  Education Outcome: Acknowledges education/In group clarification offered/Needs additional education   Affect/Mood: Appropriate   Participation Level: Engaged   Participation Quality: Independent   Behavior: Appropriate   Speech/Thought Process: Irrational   Insight: Poor   Judgement: Poor   Modes of Intervention: Activity   Patient Response to Interventions:  Engaged   Education Outcome:  In group clarification offered    Clinical Observations/Individualized Feedback: Pt was bright and focused on the activity. Pt was also attentive to peers. Pt identified the four things that represent him are the yellow brick field, super burger, a snack and the flag from the [employer]. Pt expressed the yellow brick field is authentically him because I can draw any identity I want and if it don't work, I can pick something else that works and plant it. Pt identified super burger as the side of him that is most concealed because she don't like when I do stuff. Lastly, pt stated snacks were the side of him expressed openly because  his snacks always get stuck in a snack machine.      Plan: Continue to engage patient in RT group sessions 2-3x/week.   Treston Coker-McCall, LRT,CTRS 12/29/2023 12:06 PM

## 2023-12-29 NOTE — Progress Notes (Signed)
 Patient rated his depression level 0/10 and his anxiety level 0/10 with 10 being the highest and 0 none. Patient identified his goal for today as,  Going home. Medication and group compliant. Pt observed interacting well with peers. Appetite good on shift. Safety maintained.  12/29/23 0856  Psych Admission Type (Psych Patients Only)  Admission Status Involuntary  Psychosocial Assessment  Patient Complaints Worrying  Eye Contact Fair;Intense  Facial Expression Anxious  Affect Preoccupied;Anxious  Speech Tangential  Interaction Assertive  Motor Activity Restless  Appearance/Hygiene Unremarkable  Behavior Characteristics Cooperative;Appropriate to situation  Mood Anxious;Preoccupied;Pleasant  Thought Process  Coherency Tangential  Content Preoccupation  Delusions Paranoid  Perception WDL  Hallucination None reported or observed  Judgment Limited  Confusion None  Danger to Self  Current suicidal ideation? Denies  Agreement Not to Harm Self Yes  Description of Agreement Verbal  Danger to Others  Danger to Others None reported or observed

## 2023-12-29 NOTE — Group Note (Signed)
 Date:  12/29/2023 Time:  8:45 PM  Group Topic/Focus:  Wrap-Up Group:   The focus of this group is to help patients review their daily goal of treatment and discuss progress on daily workbooks.    Participation Level:  Did Not Attend  Participation Quality:   Did Not Attend  Affect:   Did Not Attend  Cognitive:   Did Not Attend  Insight: None  Engagement in Group:   Did Not Attend  Modes of Intervention:   Did Not Attend  Additional Comments:   Pt was encouraged to attend wrap up group but did not attend.   Lonni Na 12/29/2023, 8:45 PM

## 2023-12-29 NOTE — Plan of Care (Signed)

## 2023-12-30 ENCOUNTER — Encounter (HOSPITAL_COMMUNITY): Payer: Self-pay

## 2023-12-30 DIAGNOSIS — F250 Schizoaffective disorder, bipolar type: Secondary | ICD-10-CM | POA: Diagnosis not present

## 2023-12-30 NOTE — Group Note (Signed)
 Recreation Therapy Group Note   Group Topic:Health and Wellness  Group Date: 12/30/2023 Start Time: 1015 End Time: 1040 Facilitators: Jose Corvin-McCall, LRT,CTRS Location: [address] Dayroom   Group Topic: Wellness  Goal Area(s) Addresses:  Patient will define components of whole wellness. Patient will verbalize benefit of whole wellness.  Intervention: Music  Group Description: LRT and patients discussed the importance of mental, physical and spiritual health. LRT led patients in a series of stretches to get loose. Patients then took turns leading the group in the exercises of their choosing. Patients were informed to take breaks or get water  as needed.   Education: Wellness, Building Control Surveyor.   Education Outcome: Acknowledges education/In group clarification offered/Needs additional education.    Affect/Mood: Appropriate   Participation Level: Engaged   Participation Quality: Independent   Behavior: Appropriate   Speech/Thought Process: Focused   Insight: Good   Judgement: Good   Modes of Intervention: Music   Patient Response to Interventions:  Engaged   Education Outcome:  In group clarification offered    Clinical Observations/Individualized Feedback: Pt was bright and engaged in activity. Pt was attentive and was able to complete each exercise presented during group session. Pt mentioned during group how the stretches helped loosen up his back. Pt also expressed wanting to go back to the gym.     Plan: Continue to engage patient in RT group sessions 2-3x/week.   Sharline Lehane-McCall, LRT,CTRS 12/30/2023 1:10 PM

## 2023-12-30 NOTE — Progress Notes (Signed)
 Waylon did not attend wrap up group
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
## 2023-12-30 NOTE — Plan of Care (Signed)
  Problem: Activity: Goal: Interest or engagement in activities will improve Outcome: Progressing Goal: Sleeping patterns will improve Outcome: Progressing   Problem: Coping: Goal: Ability to verbalize frustrations and anger appropriately will improve Outcome: Progressing Goal: Ability to demonstrate self-control will improve Outcome: Progressing   Problem: Physical Regulation: Goal: Ability to maintain clinical measurements within normal limits will improve Outcome: Progressing   Problem: Safety: Goal: Periods of time without injury will increase Outcome: Progressing

## 2023-12-30 NOTE — Plan of Care (Signed)
  Problem: Education: Goal: Emotional status will improve Outcome: Progressing Goal: Verbalization of understanding the information provided will improve Outcome: Progressing   Problem: Activity: Goal: Interest or engagement in activities will improve Outcome: Progressing Goal: Sleeping patterns will improve Outcome: Progressing   Problem: Health Behavior/Discharge Planning: Goal: Compliance with treatment plan for underlying cause of condition will improve Outcome: Progressing   Problem: Education: Goal: Knowledge of the prescribed therapeutic regimen will improve Outcome: Progressing   Problem: Coping: Goal: Coping ability will improve Outcome: Progressing

## 2023-12-30 NOTE — B[facility] Group Notes (Signed)
 Adult Psychoeducational Group Note  Date:  12/30/2023 Time:  8:02 PM  Group Topic/Focus:  Goals Group:   The focus of this group is to help patients establish daily goals to achieve during treatment and discuss how the patient can incorporate goal setting into their daily lives to aide in recovery. Orientation:   The focus of this group is to educate the patient on the purpose and policies of crisis stabilization and provide a format to answer questions about their admission.  The group details unit policies and expectations of patients while admitted.  Participation Level:  Did Not Attend  Participation Quality:    Affect:    Cognitive:    Insight:   Engagement in Group:    Modes of Intervention:    Additional Comments:    Rosario Duey O 12/30/2023, 8:02 PM
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
## 2023-12-30 NOTE — Progress Notes (Signed)
 This CSW and NP Donia Snell spoke with pt at length regarding ACTT services. Pt refused services and shared the services would interfere with his job and working out at [employer]  Pt also refused LAI. Pt's brother [name] Collier [phone] was made aware that pt will be discharged on 12/31/2023. Brother is able to pick him up at 4:30 pm. CSW will update Treatment Team.

## 2023-12-30 NOTE — Progress Notes (Signed)
   12/30/23 0858  Psych Admission Type (Psych Patients Only)  Admission Status Involuntary  Psychosocial Assessment  Patient Complaints Worrying  Eye Contact Fair  Facial Expression Anxious  Affect Preoccupied  Speech Tangential  Interaction Assertive  Motor Activity Restless  Appearance/Hygiene Unremarkable  Behavior Characteristics Cooperative  Mood Anxious;Preoccupied;Pleasant  Aggressive Behavior  Effect No apparent injury  Thought Process  Coherency Tangential  Content Preoccupation;Paranoia  Delusions Paranoid  Perception WDL  Hallucination None reported or observed  Judgment Poor  Confusion None  Danger to Self  Current suicidal ideation? Denies  Agreement Not to Harm Self Yes  Description of Agreement Verbal  Danger to Others  Danger to Others None reported or observed

## 2023-12-30 NOTE — Progress Notes (Signed)
   12/30/23 2030  Psych Admission Type (Psych Patients Only)  Admission Status Involuntary  Psychosocial Assessment  Patient Complaints Worrying  Eye Contact Fair  Facial Expression Anxious  Affect Preoccupied  Speech Tangential  Interaction Assertive  Motor Activity Restless  Appearance/Hygiene Unremarkable  Behavior Characteristics Cooperative  Mood Anxious;Preoccupied  Aggressive Behavior  Effect No apparent injury  Thought Process  Coherency Tangential  Content Paranoia;Preoccupation  Delusions Paranoid  Perception WDL  Hallucination None reported or observed  Judgment Limited  Confusion WDL  Danger to Self  Current suicidal ideation? Denies

## 2023-12-31 DIAGNOSIS — F250 Schizoaffective disorder, bipolar type: Secondary | ICD-10-CM | POA: Diagnosis not present

## 2023-12-31 LAB — HEPATIC FUNCTION PANEL
ALT: 68 U/L — ABNORMAL HIGH (ref 0–44)
AST: 31 U/L (ref 15–41)
Albumin: 4.1 g/dL (ref 3.5–5.0)
Alkaline Phosphatase: 70 U/L (ref 38–126)
Bilirubin, Direct: <0.1 mg/dL (ref 0.0–0.2)
Total Bilirubin: 0.6 mg/dL (ref 0.0–1.2)
Total Protein: 7.5 g/dL (ref 6.5–8.1)

## 2023-12-31 MED ORDER — OXCARBAZEPINE 300 MG PO TABS: 300.0000 mg | ORAL_TABLET | Freq: Two times a day (BID) | ORAL | 0 refills | Status: DC

## 2023-12-31 MED ORDER — FLUPHENAZINE HCL 10 MG PO TABS: 10.0000 mg | ORAL_TABLET | Freq: Every day | ORAL | 0 refills | Status: DC

## 2023-12-31 MED ORDER — BENZTROPINE MESYLATE 1 MG PO TABS: 1.0000 mg | ORAL_TABLET | Freq: Two times a day (BID) | ORAL | 0 refills | Status: DC

## 2023-12-31 MED ORDER — HYDROXYZINE HCL 25 MG PO TABS: 25.0000 mg | ORAL_TABLET | Freq: Three times a day (TID) | ORAL | 0 refills | Status: DC | PRN

## 2023-12-31 MED ORDER — FLUPHENAZINE HCL 5 MG PO TABS: 5.0000 mg | ORAL_TABLET | Freq: Two times a day (BID) | ORAL | 0 refills | Status: DC

## 2023-12-31 MED ORDER — AMLODIPINE BESYLATE 10 MG PO TABS: 10.0000 mg | ORAL_TABLET | Freq: Every day | ORAL | 0 refills | Status: AC

## 2023-12-31 NOTE — B[facility] Group Notes (Signed)
 Adult Psychoeducational Group Note  Date:  12/31/2023 Time:  1:44 PM  Group Topic/Focus:  Goals Group:   The focus of this group is to help patients establish daily goals to achieve during treatment and discuss how the patient can incorporate goal setting into their daily lives to aide in recovery. Orientation:   The focus of this group is to educate the patient on the purpose and policies of crisis stabilization and provide a format to answer questions about their admission.  The group details unit policies and expectations of patients while admitted.  Participation Level:  Did Not Attend  Participation Quality:    Affect:    Cognitive:    Insight:   Engagement in Group:    Modes of Intervention:     Additional Comments:    Tarl Cephas O 12/31/2023, 1:44 PM

## 2023-12-31 NOTE — Progress Notes (Signed)
  [facility] Adult Case Management Discharge Plan :  Will you be returning to the same living situation after discharge:  Yes,  pt will be returning to his apt, pt lives alone. At discharge, do you have transportation home?: Yes,  pt will be transported by brother  Law[place], [place] (Brother) [phone]  Do you have the ability to pay for your medications: Yes,  pt has active medical coverage.  Release of information consent forms completed and in the chart;  Patient's signature needed at discharge.  Patient to Follow up at:  Follow-up Information     [facility]. Go on 01/04/2024.   Specialty: Behavioral Health Why: You have an appointment for medication management services on 01/04/24 at 2:00 pm, in person. Contact information: [address] [place]  [address] [phone]        [facility] Go to.   Specialty: Professional Counselor Why: Please go to this provider for therapy services, Monday through Friday, from 9 am to 1 pm. Contact information: [address] [place]  [address] [place] [address] [phone]                 Next level of care provider has access to [facility] Link:yes  Safety Planning and Suicide Prevention discussed: Stan[place], Si (Brother)  [phone]      Has patient been referred to the Quitline?: Patient refused referral for treatment  Patient has been referred for addiction treatment: Patient refused referral for treatment; referral information given to patient at discharge.  Pammy Vesey, Donia SAUNDERS, LCSWA 12/31/2023, 11:05 AM

## 2023-12-31 NOTE — Group Note (Signed)
 Recreation Therapy Group Note   Group Topic:Communication  Group Date: 12/31/2023 Start Time: 1000 End Time: 1027 Facilitators: Sharley Keeler-McCall, LRT,CTRS Location: [address] Dayroom   Group Topic: Communication, Problem Solving   Goal Area(s) Addresses:  Patient will effectively listen to complete activity.  Patient will identify communication skills used to make activity successful.  Patient will identify how skills used during activity can be used to reach post d/c goals.    Intervention: Building Surveyor Activity - Geometric pattern cards, pencils, blank paper    Group Description: Geometric Drawings.  Three volunteers from the peer group will be shown an abstract picture with a particular arrangement of geometrical shapes.  Each round, one 'speaker' will describe the pattern, as accurately as possible without revealing the image to the group.  The remaining group members will listen and draw the picture to reflect how it is described to them. Patients with the role of 'listener' cannot ask clarifying questions but, may request that the speaker repeat a direction. Once the drawings are complete, the presenter will show the rest of the group the picture and compare how close each person came to drawing the picture. LRT will facilitate a post-activity discussion regarding effective communication and the importance of planning, listening, and asking for clarification in daily interactions with others.  Education: Environmental consultant, Active listening, Support systems, Discharge planning  Education Outcome: Acknowledges understanding/In group clarification offered/Needs additional education.    Affect/Mood: Appropriate   Participation Level: Engaged   Participation Quality: Independent   Behavior: Appropriate   Speech/Thought Process: Focused   Insight: Good   Judgement: Good   Modes of Intervention: Activity   Patient Response to Interventions:  Engaged    Education Outcome:  In group clarification offered    Clinical Observations/Individualized Feedback: Pt was bright and engaged in activity. Pt identified some modes of communication smoke signals and internet. Pt was the first presenter. Overall, pt did good describing his picture. Pt had some hiccups such as not being clear at some points or stumbled over his words. Peers expressed being able to follow along with pt as he presented.    Plan: Continue to engage patient in RT group sessions 2-3x/week.   Rumor Sun-McCall, LRT,CTRS 12/31/2023 12:03 PM

## 2023-12-31 NOTE — Plan of Care (Signed)
 Patient gradually improved to being able to focus on task/topic with 2 prompts during recreation therapy group sessions.   Haidyn Kilburg-McCall, LRT,CTRS

## 2023-12-31 NOTE — Progress Notes (Signed)
 Recreation Therapy Notes  INPATIENT RECREATION TR PLAN  Patient Details Name: Jary Louvier MRN: [id number] DOB: 1986-01-04 Today's Date: 12/31/2023  Rec Therapy Plan Is patient appropriate for Therapeutic Recreation?: Yes Treatment times per week: about 3 days Estimated Length of Stay: 5-7 days TR Treatment/Interventions: Group participation (Comment)  Discharge Criteria Pt will be discharged from therapy if:: Discharged Treatment plan/goals/alternatives discussed and agreed upon by:: Patient/family  Discharge Summary Short term goals set: See patient care plan Short term goals met: Complete Progress toward goals comments: Groups attended Which groups?: Self-esteem, Coping skills, Wellness, Leisure education, Communication, Other (Comment) (Team Building; Relaxation, Problem Solving) Reason goals not met: None Therapeutic equipment acquired: N/A Reason patient discharged from therapy: Discharge from hospital Pt/family agrees with progress & goals achieved: Yes Date patient discharged from therapy: 12/31/23   Angelita Harnack-McCall, LRT,CTRS Monigue Spraggins A Jerusalem Brownstein-McCall 12/31/2023, 12:32 PM

## 2023-12-31 NOTE — Progress Notes (Signed)
 Pt discharged to lobby. Pt was stable and appreciative at that time. All papers and prescriptions were given and valuables returned. Verbal understanding expressed. Denies SI/HI and A/VH. Pt given opportunity to express concerns and ask questions.

## 2023-12-31 NOTE — Group Note (Signed)
 Occupational Therapy Group Note  Group Topic: Sleep Hygiene  Group Date: 12/31/2023 Start Time: 1430 End Time: 1500 Facilitators: Dot [place] MATSU, OT   Group Description: Group encouraged increased participation and engagement through topic focused on sleep hygiene. Patients reflected on the quality of sleep they typically receive and identified areas that need improvement. Group was given background information on sleep and sleep hygiene, including common sleep disorders. Group members also received information on how to improve one's sleep and introduced a sleep diary as a tool that can be utilized to track sleep quality over a length of time. Group session ended with patients identifying one or more strategies they could utilize or implement into their sleep routine in order to improve overall sleep quality.        Therapeutic Goal(s):  Identify one or more strategies to improve overall sleep hygiene  Identify one or more areas of sleep that are negatively impacted (sleep too much, too little, etc)     Participation Level: Active and Engaged   Participation Quality: Independent   Behavior: Appropriate   Speech/Thought Process: Relevant   Affect/Mood: Appropriate   Insight: Fair   Judgement: Fair      Modes of Intervention: Education  Patient Response to Interventions:  Attentive   Plan: Continue to engage patient in OT groups 2 - 3x/week.  12/31/2023  [place] MATSU Dot, OT   Lucas Knapp, OT

## 2023-12-31 NOTE — [facility] Suicide Risk Assessment (Signed)
 Suicide Risk Assessment  Discharge Assessment    [facility] Discharge Suicide Risk Assessment   Principal Problem: Schizoaffective disorder, bipolar type [facility]) Discharge Diagnoses: Principal Problem:   Schizoaffective disorder, bipolar type (HCC) Active Problems:   Cannabis use disorder   Tobacco use disorder   Housing insecurity   Paranoid schizophrenia (HCC)   Muscle spasm of back   Long term current use of antipsychotic medication  HPI: Lucas Knapp is a 38 y.o. male with past psychiatric history of schizoaffective d/o-bipolar type, cannabis use d/o, HTN, who presented as a walk-in with mom Lucas Knapp [phone]) [facility] (12/04/2023) then transferred [facility]voluntary to [facility] (12/06/2023) for evaluation and management of increased agitation, auditory hallucinations, disorganized thinking and bizarre behavior.   During the patient's hospitalization, patient had extensive initial psychiatric evaluation, and follow-up psychiatric evaluations every day.  Psychiatric diagnoses provided upon initial assessment: Principal diagnosis is Paranoid Schizophrenia. Other diagnoses as listed above.  Patient's psychiatric medications were adjusted on admission: As listed below: -Start hydroxyzine  25 mg three times a day as needed for anxiety- -start trazodone  100 mg at bedtime as needed for insomnia -Nicorette gum and patch ordered as needed for smoking cessation   During the hospitalization, other adjustments were made to the patient's psychiatric medication regimen: Medications at discharge are as follows: -Continue Prolixin  5 mg qam, 5 mg q afternoon, and 10 mg at bedtime - for psychosis. Goal was for LAI, to which pt was initially receptive, but refused medication prior to discharge. The community assertive treatment team was also discussed with pt upon hospitalipzation, they visited pt twice during this hospitalization, and were going to come to hospital on day of discharge to see pt and  complete their initial intake appointment with him, but pt declined their services shortly prior to discharge.  -Continue cogentin  1 mg po bid for EPS prophylaxis.  -Continue trileptal  300 mg Q 12 hrs for mood stabilization -Continue Hydroxyzine  25 mg TID PRN for anxiety  Patient's care was discussed during the interdisciplinary team meeting every day during the hospitalization. The patient denies having side effects to prescribed psychiatric medication. Gradually, patient started adjusting to milieu. The patient was evaluated each day by a clinical provider to ascertain response to treatment. Improvement was noted by the patient's report of decreasing symptoms, improved sleep and appetite, affect, medication tolerance, behavior, and participation in unit programming.  Patient was asked each day to complete a self inventory noting mood, mental status, pain, new symptoms, anxiety and concerns. Symptoms were reported as significantly decreased or resolved completely by discharge.   On day of discharge, the patient reports that their mood is stable. The patient denied having suicidal thoughts for more than 48 hours prior to discharge.  Patient denies having homicidal thoughts.  Patient denies having auditory hallucinations.  Patient denies any visual hallucinations or other symptoms of psychosis. The patient was motivated to continue taking medication with a goal of continued improvement in mental health.   The patient reports their target psychiatric symptoms of anxiety, psychosis, insomnia responded well to the psychiatric medications, and the patient reports overall benefit from this psychiatric hospitalization. Supportive psychotherapy was provided to the patient. The patient also participated in regular group therapy while hospitalized. Coping skills, problem solving as well as relaxation therapies were also part of the unit programming.  Labs were reviewed with the patient, and abnormal results were  discussed with the patient. LFTs elevated on admission with AST-67 & ALT-162. Weekly LFTs completed over the course of this  hospitalization and levels have significantly decreased, and almost resolved, with AST WNL at 31, and ALT still slightly elevated at 68. Pt educated to follow this up with his PCP after discharge, and verbalizes understanding. TSH on 12/13 WNL at 1.496, Ha1c WNL at 5. Lipid panel with cholesterol at 219, & LDL slightly elevated at 142. Pt educated on healthy food choices & exercise while taking antipsychotic medications, and verbalizes understanding.  The patient is able to verbalize their individual safety plan to this provider.  # It is recommended to the patient to continue psychiatric medications as prescribed, after discharge from the hospital.    # It is recommended to the patient to follow up with your outpatient psychiatric provider and PCP.  # It was discussed with the patient, the impact of alcohol , drugs, tobacco have been there overall psychiatric and medical wellbeing, and total abstinence from substance use was recommended the patient.ed.  # Prescriptions provided or sent directly to preferred pharmacy at discharge. Patient agreeable to plan. Given opportunity to ask questions. Appears to feel comfortable with discharge.    # In the event of worsening symptoms, the patient is instructed to call the crisis hotline (988), 911 and or go to the nearest ED for appropriate evaluation and treatment of symptoms. To follow-up with primary care provider for other medical issues, concerns and or health care needs  # Patient was discharged home with a plan to follow up as noted below.   Total Time spent with patient: 45 minutes  Musculoskeletal: Strength & Muscle Tone: within normal limits Gait & Station: normal Patient leans: N/A  Psychiatric Specialty Exam  Presentation  General Appearance:  Appropriate for Environment; Fairly Groomed  Eye  Contact: Good  Speech: Clear and Coherent  Speech Volume: Normal  Handedness: Right   Mood and Affect  Mood: Euthymic  Duration of Depression Symptoms: No data recorded Affect: Appropriate; Congruent   Thought Process  Thought Processes: Coherent  Descriptions of Associations:Intact  Orientation:Full (Time, Place and Person)  Thought Content:Logical  History of Schizophrenia/Schizoaffective disorder:Yes  Duration of Psychotic Symptoms:Greater than six months  Hallucinations:Hallucinations: None  Ideas of Reference:None  Suicidal Thoughts:Suicidal Thoughts: No  Homicidal Thoughts:Homicidal Thoughts: No   Sensorium  Memory: Recent Fair  Judgment: Fair  Insight: Fair   Art Therapist  Concentration: Fair  Attention Span: Fair  Recall: [employer] of Knowledge: Fair  Language: Fair   Psychomotor Activity  Psychomotor Activity: Psychomotor Activity: Normal   Assets  Assets: Resilience; Social Support   Sleep  Sleep: Sleep: Good   Physical Exam: Physical Exam Vitals and nursing note reviewed.    Review of Systems  Psychiatric/Behavioral:  Positive for depression (Denies SI/HI, denies intent or plan to harm self or any one in the community) and substance abuse (Educated on the, negative impact of substance use on his mental health and verbalizes understanding). Negative for hallucinations, memory loss and suicidal ideas. The patient is nervous/anxious (Stable for management outpatient) and has insomnia (Stable for outpatient management).   All other systems reviewed and are negative.  Blood pressure 119/80, pulse 66, temperature 98.5 F (36.9 C), temperature source Oral, resp. rate 18, height 5' 3 (1.6 m), weight 82.6 kg, SpO2 99%. Body mass index is 32.24 kg/m.  Mental Status Per Nursing Assessment::   On Admission:  NA  At discharge, pt is AAOX4  Demographic Factors:  Male and Low socioeconomic status  Loss  Factors: NA  Historical Factors: Family history of mental illness or substance  abuse  Risk Reduction Factors:   Positive social support  Continued Clinical Symptoms:  Previous Psychiatric Diagnoses and Treatments  Cognitive Features That Contribute To Risk:  None    Suicide Risk:  Mild:  There are no identifiable suicide plans, no associated intent, mild dysphoria and related symptoms, good self-control (both objective and subjective assessment), few other risk factors, and identifiable protective factors, including available and accessible social support.    Follow-up Information     [facility]. Go on 01/04/2024.   Specialty: Behavioral Health Why: You have an appointment for medication management services on 01/04/24 at 2:00 pm, in person. Contact information: [address] [place]  [address] [phone]        [facility]. Go to.   Specialty: Professional Counselor Why: Please go to this provider for therapy services, Monday through Friday, from 9 am to 1 pm. Contact information: [address] [place] [address] [phone]                Donia Snell, NP 12/31/2023, 9:28 AM

## 2024-01-03 NOTE — Progress Notes (Signed)
 Received a call from patient on 1/12 noting that his pharmacy has been closed since his discharge, requested medications to be called to [facility] on [address], this provider called 30 days prescription with no refills for all discharge medications including amlodipine , Vistaril , Trileptal , Prolixin  and Cogentin .

## 2024-01-04 ENCOUNTER — Encounter (HOSPITAL_COMMUNITY): Payer: Self-pay | Admitting: Psychiatry

## 2024-01-04 ENCOUNTER — Telehealth (HOSPITAL_COMMUNITY): Payer: Medicare HMO | Admitting: Psychiatry

## 2024-01-04 DIAGNOSIS — F200 Paranoid schizophrenia: Secondary | ICD-10-CM

## 2024-01-04 DIAGNOSIS — F411 Generalized anxiety disorder: Secondary | ICD-10-CM | POA: Diagnosis not present

## 2024-01-04 MED ORDER — BENZTROPINE MESYLATE 1 MG PO TABS: 1.0000 mg | ORAL_TABLET | Freq: Two times a day (BID) | ORAL | 3 refills | Status: DC

## 2024-01-04 MED ORDER — OXCARBAZEPINE 300 MG PO TABS: 300.0000 mg | ORAL_TABLET | Freq: Two times a day (BID) | ORAL | 3 refills | Status: DC

## 2024-01-04 MED ORDER — HYDROXYZINE HCL 25 MG PO TABS: 25.0000 mg | ORAL_TABLET | Freq: Three times a day (TID) | ORAL | 3 refills | Status: DC | PRN

## 2024-01-04 MED ORDER — FLUPHENAZINE HCL 5 MG PO TABS: 5.0000 mg | ORAL_TABLET | Freq: Two times a day (BID) | ORAL | 3 refills | Status: DC

## 2024-01-04 MED ORDER — FLUPHENAZINE HCL 10 MG PO TABS
10.0000 mg | ORAL_TABLET | Freq: Every day | ORAL | 3 refills | Status: DC
Start: 2024-01-04 — End: 2024-02-02
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
## 2024-01-04 NOTE — Discharge Summary (Signed)
 Physician Discharge Summary Note  Patient:  Lucas Knapp is an 38 y.o., male MRN:  [id number] DOB:  09/10/1986 Patient phone:  [phone] (home)  Patient address:   [address] [place] [address],  Total Time spent with patient: 45 minutes  Date of Admission:  12/06/2023 Date of Discharge: 12/31/2023  Reason for Admission:  Lucas Knapp is a 38 y.o. male with past psychiatric history of schizoaffective d/o-bipolar type, cannabis use d/o, HTN, who presented as a walk-in with mom Blaike Vickers [phone]) [facility] (12/04/2023) then transferred Involuntary to [facility] (12/06/2023) for evaluation and management of increased agitation, auditory hallucinations, disorganized thinking and bizarre behavior.   Principal Problem: Schizoaffective disorder, bipolar type [facility]) Discharge Diagnoses: Principal Problem:   Schizoaffective disorder, bipolar type (HCC) Active Problems:   Cannabis use disorder   Tobacco use disorder   Housing insecurity   Paranoid schizophrenia (HCC)   Muscle spasm of back   Long term current use of antipsychotic medication  Past Psychiatric History: See H & P  Past Medical History:  Past Medical History:  Diagnosis Date   Back pain    Bipolar 1 disorder (HCC)    Bipolar disease, manic (HCC) 06/08/2012   Cannabis use disorder 09/17/2012   Headache 06/08/2012   Hyperlipidemia    Hypertension    pt has been prescribed HCTZ 12.5 mg daily. Pt was 140/80 on admission.    Insomnia 05/02/2014   Long term current use of antipsychotic medication 12/16/2023   Schizoaffective disorder, bipolar type (HCC) 06/08/2012   Schizophrenia (HCC)    Traumatic myalgia 06/08/2012   History reviewed. No pertinent surgical history. Family History:  Family History  Problem Relation Age of Onset   Hypertension Father    Hyperlipidemia Father    Hypertension Paternal Grandmother    Hypertension Paternal Grandfather    Hypertension Brother         identical twin   Psychosis Brother        mental issues     Family Psychiatric  History: See H & P Social History:  Social History   Substance and Sexual Activity  Alcohol  Use Yes   Alcohol /week: 4.0 standard drinks of alcohol    Types: 4 Cans of beer per week   Comment: occasional alcohol  use roughly 2x weekly, 1-2 drinks per event     Social History   Substance and Sexual Activity  Drug Use Yes   Types: Marijuana   Comment: Pt refused to comment on drug use    Social History   Socioeconomic History   Marital status: Single    Spouse name: Not on file   Number of children: Not on file   Years of education: Not on file   Highest education level: Not on file  Occupational History   Not on file  Tobacco Use   Smoking status: Every Day    Types: Cigarettes   Smokeless tobacco: Never   Tobacco comments:    10-15  Vaping Use   Vaping status: Not on file  Substance and Sexual Activity   Alcohol  use: Yes    Alcohol /week: 4.0 standard drinks of alcohol     Types: 4 Cans of beer per week    Comment: occasional alcohol  use roughly 2x weekly, 1-2 drinks per event   Drug use: Yes    Types: Marijuana    Comment: Pt refused to comment on drug use   Sexual activity: Not Currently  Other Topics Concern   Not on file  Social History Narrative   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 Also works for the [employer] one day a week.    Single   No children   Completed college (bachelors in Statistician)   Enjoys swimming, playing pool, walking   [place] .  Has identical twin and 2 other brothers in [place]      06/2022: Homeless      07/03/22: Pt says he lives alone and works but doesn't specify his job. Pt denies social support. Has psychiatric services with Laymon Bach, NP.   Social Drivers of Corporate Investment Banker Strain: Not on file  Food Insecurity: Patient Declined (12/06/2023)   Hunger Vital Sign    Worried About Running Out of  Food in the Last Year: Patient declined    Ran Out of Food in the Last Year: Patient declined  Transportation Needs: Patient Declined (12/06/2023)   PRAPARE - Administrator, Civil Service (Medical): Patient declined    Lack of Transportation (Non-Medical): Patient declined  Physical Activity: Not on file  Stress: Not on file  Social Connections: Not on file   Hospital Course:   During the patient's hospitalization, patient had extensive initial psychiatric evaluation, and follow-up psychiatric evaluations every day.   Psychiatric diagnoses provided upon initial assessment: Principal diagnosis is Paranoid Schizophrenia. Other diagnoses as listed above.   Patient's psychiatric medications were adjusted on admission: As listed below: -Start hydroxyzine  25 mg three times a day as needed for anxiety- -start trazodone  100 mg at bedtime as needed for insomnia -Nicorette gum and patch ordered as needed for smoking cessation    During the hospitalization, other adjustments were made to the patient's psychiatric medication regimen: Medications at discharge are as follows: -Continue Prolixin  5 mg qam, 5 mg q afternoon, and 10 mg at bedtime - for psychosis. Goal was for LAI, to which pt was initially receptive, but refused medication prior to discharge. The community assertive treatment team was also discussed with pt upon hospitalipzation, they visited pt twice during this hospitalization, and were going to come to hospital on day of discharge to see pt and complete their initial intake appointment with him, but pt declined their services shortly prior to discharge.   -Continue cogentin  1 mg po bid for EPS prophylaxis.  -Continue trileptal  300 mg Q 12 hrs for mood stabilization -Continue Hydroxyzine  25 mg TID PRN for anxiety   Patient's care was discussed during the interdisciplinary team meeting every day during the hospitalization. The patient denies having side effects to prescribed  psychiatric medication. Gradually, patient started adjusting to milieu. The patient was evaluated each day by a clinical provider to ascertain response to treatment. Improvement was noted by the patient's report of decreasing symptoms, improved sleep and appetite, affect, medication tolerance, behavior, and participation in unit programming.  Patient was asked each day to complete a self inventory noting mood, mental status, pain, new symptoms, anxiety and concerns. Symptoms were reported as significantly decreased or resolved completely by discharge.    On day of discharge, the patient reports that their mood is stable. The patient denied having suicidal thoughts for more than 48 hours prior to discharge.  Patient denies having homicidal thoughts.  Patient denies having auditory hallucinations.  Patient denies any visual hallucinations or other symptoms of psychosis. The patient was motivated to continue taking medication with a goal of continued improvement in mental health.    The patient reports their target  psychiatric symptoms of anxiety, psychosis, insomnia responded well to the psychiatric medications, and the patient reports overall benefit from this psychiatric hospitalization. Supportive psychotherapy was provided to the patient. The patient also participated in regular group therapy while hospitalized. Coping skills, problem solving as well as relaxation therapies were also part of the unit programming.   Labs were reviewed with the patient, and abnormal results were discussed with the patient. LFTs elevated on admission with AST-67 & ALT-162. Weekly LFTs completed over the course of this hospitalization and levels have significantly decreased, and almost resolved, with AST WNL at 31, and ALT still slightly elevated at 68. Pt educated to follow this up with his PCP after discharge, and verbalizes understanding. TSH on 12/13 WNL at 1.496, Ha1c WNL at 5. Lipid panel with cholesterol at 219, & LDL  slightly elevated at 857. Pt educated on healthy food choices & exercise while taking antipsychotic medications, and verbalizes understanding.   The patient is able to verbalize their individual safety plan to this provider.   # It is recommended to the patient to continue psychiatric medications as prescribed, after discharge from the hospital.     # It is recommended to the patient to follow up with your outpatient psychiatric provider and PCP.   # It was discussed with the patient, the impact of alcohol , drugs, tobacco have been there overall psychiatric and medical wellbeing, and total abstinence from substance use was recommended the patient.ed.   # Prescriptions provided or sent directly to preferred pharmacy at discharge. Patient agreeable to plan. Given opportunity to ask questions. Appears to feel comfortable with discharge.    # In the event of worsening symptoms, the patient is instructed to call the crisis hotline (988), 911 and or go to the nearest ED for appropriate evaluation and treatment of symptoms. To follow-up with primary care provider for other medical issues, concerns and or health care needs   # Patient was discharged home with a plan to follow up as noted below.    Total Time spent with patient: 45 minutes Physical Findings: AIMS: Facial and Oral Movements Muscles of Facial Expression: None Lips and Perioral Area: None Jaw: None Tongue: None,Extremity Movements Upper (arms, wrists, hands, fingers): None Lower (legs, knees, ankles, toes): None, Trunk Movements Neck, shoulders, hips: None, Global Judgements Severity of abnormal movements overall : None Incapacitation due to abnormal movements: None Patient's awareness of abnormal movements: No Awareness, Dental Status Current problems with teeth and/or dentures?: No Does patient usually wear dentures?: No  CIWA:    COWS:    AIMS:0 Musculoskeletal: Strength & Muscle Tone: within normal limits Gait & Station:  normal Patient leans: N/A  Psychiatric Specialty Exam:  Presentation  General Appearance:  Appropriate for Environment; Fairly Groomed  Eye Contact: Good  Speech: Clear and Coherent  Speech Volume: Normal  Handedness: Right   Mood and Affect  Mood: Euthymic  Affect: Appropriate; Congruent   Thought Process  Thought Processes: Coherent  Descriptions of Associations:Intact  Orientation:Full (Time, Place and Person)  Thought Content:Logical  History of Schizophrenia/Schizoaffective disorder:Yes  Duration of Psychotic Symptoms:Greater than six months  Hallucinations:No data recorded Ideas of Reference:None  Suicidal Thoughts:No data recorded Homicidal Thoughts:No data recorded  Sensorium  Memory: Recent Fair  Judgment: Fair  Insight: Fair   Chartered Certified Accountant: Fair  Attention Span: Fair  Recall: [employer] of Knowledge: Fair  Language: Fair   Psychomotor Activity  Psychomotor Activity:No data recorded  Assets  Assets: Resilience; Social Support  Sleep  Sleep:No data recorded   Physical Exam: Physical Exam ROS Blood pressure 119/80, pulse 66, temperature 98.5 F (36.9 C), temperature source Oral, resp. rate 18, height 5' 3 (1.6 m), weight 82.6 kg, SpO2 99%. Body mass index is 32.24 kg/m.   Social History   Tobacco Use  Smoking Status Every Day   Types: Cigarettes  Smokeless Tobacco Never  Tobacco Comments   10-15   Tobacco Cessation:  A prescription for an FDA-approved tobacco cessation medication was offered at discharge and the patient refused   Blood Alcohol  level:  Lab Results  Component Value Date   [facility] <10 12/04/2023   ETH <10 07/03/2022    Metabolic Disorder Labs:  Lab Results  Component Value Date   HGBA1C 5.0 12/04/2023   MPG 96.8 12/04/2023   MPG 102.54 07/03/2022   Lab Results  Component Value Date   PROLACTIN 4.7 08/18/2023   PROLACTIN 30.0 (H) 07/29/2018   Lab  Results  Component Value Date   CHOL 219 (H) 12/04/2023   TRIG 134 12/04/2023   HDL 50 12/04/2023   CHOLHDL 4.4 12/04/2023   VLDL 27 12/04/2023   LDLCALC 142 (H) 12/04/2023   LDLCALC 147 (H) 08/18/2023    See Psychiatric Specialty Exam and Suicide Risk Assessment completed by Attending Physician prior to discharge.  Discharge destination:  Home  Is patient on multiple antipsychotic therapies at discharge:  No   Has Patient had three or more failed trials of antipsychotic monotherapy by history:  No  Recommended Plan for Multiple Antipsychotic Therapies: NA   Allergies as of 12/31/2023       Reactions   Geodon  [ziprasidone  Hcl] Other (See Comments)   Dry throat   Haldol  [haloperidol ] Swelling, Other (See Comments)   Swollen tongue and abnormal body movements        Medication List     TAKE these medications      Indication  amLODipine  10 MG tablet Commonly known as: NORVASC  Take 1 tablet (10 mg total) by mouth at bedtime.  Indication: High Blood Pressure   benztropine  1 MG tablet Commonly known as: COGENTIN  Take 1 tablet (1 mg total) by mouth 2 (two) times daily.  Indication: Extrapyramidal Reaction caused by Medications   fluPHENAZine  10 MG tablet Commonly known as: PROLIXIN  Take 1 tablet (10 mg total) by mouth at bedtime.  Indication: Schizophrenia   fluPHENAZine  5 MG tablet Commonly known as: PROLIXIN  Take 1 tablet (5 mg total) by mouth 2 (two) times daily. Take one tab at 0800, and one tab at 1400 in addition to the night dose of 10 mg at bedtime  Indication: Schizophrenia   hydrOXYzine  25 MG tablet Commonly known as: ATARAX  Take 1 tablet (25 mg total) by mouth 3 (three) times daily as needed for anxiety.  Indication: Feeling Anxious   Oxcarbazepine  300 MG tablet Commonly known as: TRILEPTAL  Take 1 tablet (300 mg total) by mouth every 12 (twelve) hours.  Indication: moomd stabilization        Follow-up Information     [facility]. Go on 01/04/2024.   Specialty: Behavioral Health Why: You have an appointment for medication management services on 01/04/24 at 2:00 pm, in person. Contact information: [address] [place]  [address] [phone]        [facility] Go to.   Specialty: Professional Counselor Why: Please go to this provider for therapy services, Monday through Friday, from 9 am to 1 pm. Contact information:  [address] [place]  [address] [place] [address] [phone]                Signed: Donia Snell, NP 01/04/2024, 1:25 PM

## 2024-02-02 ENCOUNTER — Ambulatory Visit (INDEPENDENT_AMBULATORY_CARE_PROVIDER_SITE_OTHER): Payer: Medicare HMO | Admitting: Psychiatry

## 2024-02-02 ENCOUNTER — Encounter (HOSPITAL_COMMUNITY): Payer: Self-pay | Admitting: Psychiatry

## 2024-02-02 VITALS — BP 143/99 | HR 60 | Temp 98.6°F | Ht 64.0 in | Wt 202.6 lb

## 2024-02-02 DIAGNOSIS — F4310 Post-traumatic stress disorder, unspecified: Secondary | ICD-10-CM

## 2024-02-02 DIAGNOSIS — F200 Paranoid schizophrenia: Secondary | ICD-10-CM

## 2024-02-02 MED ORDER — BENZTROPINE MESYLATE 1 MG PO TABS
1.0000 mg | ORAL_TABLET | Freq: Two times a day (BID) | ORAL | 3 refills | Status: DC
Start: 2024-02-02 — End: 2024-04-21

## 2024-02-02 MED ORDER — FLUPHENAZINE HCL 10 MG PO TABS: 10.0000 mg | ORAL_TABLET | Freq: Every day | ORAL | 3 refills | Status: DC

## 2024-02-02 MED ORDER — FLUPHENAZINE HCL 5 MG PO TABS
5.0000 mg | ORAL_TABLET | Freq: Two times a day (BID) | ORAL | 3 refills | Status: DC
Start: 2024-02-02 — End: 2024-04-21

## 2024-02-02 MED ORDER — OXCARBAZEPINE 300 MG PO TABS: 300.0000 mg | ORAL_TABLET | Freq: Two times a day (BID) | ORAL | 3 refills | Status: DC

## 2024-02-02 NOTE — Progress Notes (Signed)
BH MD/PA/NP OP Progress Note       02/02/2024 2:32 PM Lucas Knapp  MRN:  [id number]  Chief Complaint: "I think I maybe on to many medication"  HPI: 38 year old male seen today for follow up psychiatric evaluation. He has a psychiatric history of paranoid schizophrenia, bipolar disorder, schizoaffective disorder, and cannabis use disorder. He is currently managed on cogentin 1 mg twice daily, Prolixin 10 mg nightly, Prolixin 5 mg twice daily, hydroxyzine 25 mg three times daily as needed, and Trileptal 300 mg twice daily. He notes his medications are effective in managing his psychiatric conditions.    Today he is well-groomed, cooperative, and somewhat engaged in conversation.  Patient is alert and oriented today. At patients last visit he he presented with disorganized thinking, delusions, and psychosis.  Today he informed writer that he feels like he is on too many medications reporting that he developed a bump on his leg and one on his face that may have been associated with his medication.  He denies shortness of breath, vomiting, GI upset, fever, or a rash.  Patient also informed writer his mouth is dried. Provider informed patient that  hydroxyzine can dry his mouth out.   Provider reccommended discontinuing it. Provider informed patient that at his last visit he presented with some delusional thinking and disorganized thoughts.  Provider recommended patient continuing his current dose of medications and informed him that if he would like to reduce his Trileptal in the future he could.  He endorsed understanding and agreement.   Since his last visit he notes that his anxiety and depression are well-managed.  Provider conducted a GAD-7 and patient is a 3.  Provider also conducted a PHQ-9 the patient scored a 2.  He endorses adequate sleep and appetite.  Today he denies SI/HI/VAH, mania, paranoia.   Patient informed writer that he continues to have flashbacks and nightmares regarding past  trauma.  Provider recommended patient speak with a therapist.  He was agreeable to this. Patient notes that he at times avoid things that remind him of his past trauma and listens to [employer] on Youtube.    Provider informed patient to discontinue hydroxyzine as it was drying out his mouth.  He endorsed understanding and agreed. Patient agreeable to continue all other medication as prescribed.  Provider informed patient at his next visit if he would like to reduce Trileptal it can be reduced.  Provider strongly recommends patient continue Prolixin at its current does as his paranoia, delusional thinking and psychosis has improved. He endorsed understanding and agreed.   No other concerns noted at this time.   Visit Diagnosis:    ICD-10-CM   1. PTSD (post-traumatic stress disorder)  F43.10 Ambulatory referral to Social Work    2. Paranoid schizophrenia (HCC)  F20.0 benztropine (COGENTIN) 1 MG tablet    fluPHENAZine (PROLIXIN) 5 MG tablet    fluPHENAZine (PROLIXIN) 10 MG tablet    Oxcarbazepine (TRILEPTAL) 300 MG tablet           Past Psychiatric History: paranoid schizophrenia, bipolar disorder, schizoaffective disorder, and cannabis use disorder.  Past Medical History:  Past Medical History:  Diagnosis Date   Back pain    Bipolar 1 disorder (HCC)    Bipolar disease, manic (HCC) 06/08/2012   Cannabis use disorder 09/17/2012   Headache 06/08/2012   Hyperlipidemia    Hypertension    pt has been prescribed HCTZ 12.5 mg daily. Pt was 140/80 on admission.  Insomnia 05/02/2014   Long term current use of antipsychotic medication 12/16/2023   Schizoaffective disorder, bipolar type (HCC) 06/08/2012   Schizophrenia (HCC)    Traumatic myalgia 06/08/2012   No past surgical history on file.  Family Psychiatric History: Cousin ADHD, Twin Brother history of schizophrenia and schizoaffective bipolar type  Family History:  Family History  Problem Relation Age of Onset    Hypertension Father    Hyperlipidemia Father    Hypertension Paternal Grandmother    Hypertension Paternal Grandfather    Hypertension Brother        identical twin   Psychosis Brother        "mental issues"      Social History:  Social History   Socioeconomic History   Marital status: Single    Spouse name: Not on file   Number of children: Not on file   Years of education: Not on file   Highest education level: Not on file  Occupational History   Not on file  Tobacco Use   Smoking status: Every Day    Types: Cigarettes   Smokeless tobacco: Never   Tobacco comments:    10-15  Vaping Use   Vaping status: Not on file  Substance and Sexual Activity   Alcohol use: Yes    Alcohol/week: 4.0 standard drinks of alcohol    Types: 4 Cans of beer per week    Comment: occasional alcohol use roughly 2x weekly, 1-2 drinks per event   Drug use: Yes    Types: Marijuana    Comment: Pt refused to comment on drug use   Sexual activity: Not Currently  Other Topics Concern   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    Single   No children   Completed college (bachelors in Statistician)   Enjoys swimming, playing pool, walking   Grew up [place].  Has identical twin and 2 other brothers in [place]      06/2022: Homeless      07/03/22: Pt says he lives alone and works but doesn't specify his job. Pt denies social support. Has psychiatric services with Gretchen Short, NP.   Social Drivers of Corporate investment banker Strain: Not on file  Food Insecurity: Patient Declined (12/06/2023)   Hunger Vital Sign    Worried About Running Out of Food in the Last Year: Patient declined    Ran Out of Food in the Last Year: Patient declined  Transportation Needs: Patient Declined (12/06/2023)   PRAPARE - Administrator, Civil Service (Medical): Patient declined    Lack of  Transportation (Non-Medical): Patient declined  Physical Activity: Not on file  Stress: Not on file  Social Connections: Not on file    Allergies:  Allergies  Allergen Reactions   Geodon [Ziprasidone Hcl] Other (See Comments)    Dry throat   Haldol [Haloperidol] Swelling and Other (See Comments)    Swollen tongue and abnormal body movements    Metabolic Disorder Labs: Lab Results  Component Value Date   HGBA1C 5.0 12/04/2023   MPG 96.8 12/04/2023   MPG 102.54 07/03/2022   Lab Results  Component Value Date   PROLACTIN 4.7 08/18/2023   PROLACTIN 30.0 (H) 07/29/2018   Lab Results  Component Value Date   CHOL 219 (H) 12/04/2023   TRIG 134 12/04/2023   HDL 50  12/04/2023   CHOLHDL 4.4 12/04/2023   VLDL 27 12/04/2023   LDLCALC 142 (H) 12/04/2023   LDLCALC 147 (H) 08/18/2023   Lab Results  Component Value Date   TSH 1.496 12/04/2023   TSH 2.200 08/18/2023    Therapeutic Level Labs: No results found for: "LITHIUM" No results found for: "VALPROATE" Lab Results  Component Value Date   CBMZ <0.5 (L) 10/09/2012   CBMZ 2.2 (L) 09/21/2012    Current Medications: Current Outpatient Medications  Medication Sig Dispense Refill   amLODipine (NORVASC) 10 MG tablet Take 1 tablet (10 mg total) by mouth at bedtime. 30 tablet 0   benztropine (COGENTIN) 1 MG tablet Take 1 tablet (1 mg total) by mouth 2 (two) times daily. 60 tablet 3   fluPHENAZine (PROLIXIN) 10 MG tablet Take 1 tablet (10 mg total) by mouth at bedtime. 30 tablet 3   fluPHENAZine (PROLIXIN) 5 MG tablet Take 1 tablet (5 mg total) by mouth 2 (two) times daily. Take one tab at 0800, and one tab at 1400 in addition to the night dose of 10 mg at bedtime 60 tablet 3   Oxcarbazepine (TRILEPTAL) 300 MG tablet Take 1 tablet (300 mg total) by mouth every 12 (twelve) hours. 60 tablet 3   No current facility-administered medications for this visit.     Musculoskeletal: Strength & Muscle Tone: within normal limits Gait &  Station: normal,  Patient leans: N/A  Psychiatric Specialty Exam: Review of Systems  Blood pressure (!) 143/99, pulse 60, temperature 98.6 F (37 C), height 5\' 4"  (1.626 m), weight 202 lb 9.6 oz (91.9 kg), SpO2 100%.Body mass index is 34.78 kg/m.  General Appearance: Well Groomed  Eye Contact:  Good  Speech:  Normal Rate  Volume:  Normal  Mood:  Euthymic  Affect:  Congruent  Thought Process:  Coherent, Goal Directed, and Linear  Orientation:  Full (Time, Place, and Person)  Thought Content: WDL and Logical   Suicidal Thoughts:  No  Homicidal Thoughts:  No  Memory:  Immediate;   Good Recent;   Good Remote;   Good  Judgement:  Good  Insight:  Good  Psychomotor Activity:  Normal  Concentration:  Concentration: Good and Attention Span: Good  Recall:  Good  Fund of Knowledge: Good  Language: Good  Akathisia:  No  Handed:  Right  AIMS (if indicated): not done  Assets:  Communication Skills Desire for Improvement Financial Resources/Insurance Housing Social Support  ADL's:  Intact  Cognition: WNL  Sleep:  Good   Screenings: AIMS    Flowsheet Row [facility] Clinical Support from 11[facility]05/2023 in [facility] Clinical Support from 03/10/2023 in [facility] Admission (Discharged) [facility] Clinical Support from 09/09/2021 in [facility]  AIMS Total Score 0 4 0 0 0      AUDIT    Flowsheet Row Ad[facility]) [facility] Admission (Discharged[facility] (Dis[facility]  Alcohol Use Disorder Identification Test Final Score (AUDIT) 0 0 0      GAD-7    Flowsheet Row Clinical Support  from 02/02/2024 in [facility] Clinical Support from 11/05/2023 in [facility] Video Visit from 08/25/2023 in [facility] Video  Visit from 05/20/2023 in [facility] Clinical Support from 09/09/2021 in [facility]  Total GAD-7 Score 3 2 3 2 7       PHQ2-9    Flowsheet Row Clinical Support from 02/02/2024 in [facility] Clinical Support from 11/05/2023 in [facility] Video Visit from 08/25/2023 in [facility] Video Visit from 05/20/2023 in [facility] Clinical Support from 03/10/2023 in [facility]  PHQ-2 Total Score 1 1 0 1 0  PHQ-9 Total Score 2 3 0 2 3      Flowsheet Row Clinical Support from 02/02/2024 in [facility] Admission (Discharged) [facility] from 12/04/2023 in [facility]  C-SSRS RISK CATEGORY No Risk No Risk No Risk        Assessment and Plan: Patient does not present with delusional thinking, paranoia, or psychosis today. He notes that his mood is stable and notes that he has minimal anxiety and depression. Patient notes that at times his mouth is dried and notes that he believes he is on to many medications. Provider informed patient to discontinue hydroxyzine as it was drying out his mouth.  He endorsed understanding and agreed. Patient agreeable to continue all other medication as prescribed.  Provider informed patient at his next visit if he would like to reduce Trileptal it can be reduced.  Provider strongly recommends patient continue Prolixin at its current does as his paranoia, delusional thinking and psychosis has improved. He endorsed understanding and agreed.   1. Paranoid  schizophrenia (HCC)  Continue- benztropine (COGENTIN) 1 MG tablet; Take 1 tablet (1 mg total) by mouth 2 (two) times daily.  Dispense: 60 tablet; Refill: 3 Continue- fluPHENAZine (PROLIXIN) 5 MG tablet; Take 1 tablet (5 mg total) by mouth 2 (two) times daily. Take one tab at 0800, and one tab at 1400 in addition to the night dose of 10 mg at bedtime  Dispense: 60 tablet; Refill: 3 Continue- fluPHENAZine (PROLIXIN) 10 MG tablet; Take 1 tablet (10 mg total) by mouth at bedtime.  Dispense: 30 tablet; Refill: 3 Continue- Oxcarbazepine (TRILEPTAL) 300 MG tablet; Take 1 tablet (300 mg total) by mouth every 12 (twelve) hours.  Dispense: 60 tablet; Refill: 3   2. PTSD (post-traumatic stress disorder) (Primary)  - Ambulatory referral to Social Work    Follow up in 2.5 months   Shanna Cisco, NP 02/02/2024, 2:32 PM

## 2024-04-18 ENCOUNTER — Ambulatory Visit (INDEPENDENT_AMBULATORY_CARE_PROVIDER_SITE_OTHER): Payer: Medicare HMO | Admitting: Clinical

## 2024-04-18 ENCOUNTER — Encounter (HOSPITAL_COMMUNITY): Payer: Self-pay

## 2024-04-18 DIAGNOSIS — F200 Paranoid schizophrenia: Secondary | ICD-10-CM | POA: Diagnosis not present

## 2024-04-18 DIAGNOSIS — F4310 Post-traumatic stress disorder, unspecified: Secondary | ICD-10-CM
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
## 2024-04-21 ENCOUNTER — Encounter (HOSPITAL_COMMUNITY): Payer: Self-pay | Admitting: Psychiatry

## 2024-04-21 ENCOUNTER — Ambulatory Visit (HOSPITAL_COMMUNITY): Payer: Medicare HMO | Admitting: Psychiatry

## 2024-04-21 DIAGNOSIS — F200 Paranoid schizophrenia: Secondary | ICD-10-CM

## 2024-04-21 MED ORDER — FLUPHENAZINE HCL 10 MG PO TABS: 10.0000 mg | ORAL_TABLET | Freq: Every day | ORAL | 3 refills | Status: DC

## 2024-04-21 MED ORDER — FLUPHENAZINE HCL 5 MG PO TABS: 5.0000 mg | ORAL_TABLET | Freq: Two times a day (BID) | ORAL | 3 refills | Status: DC

## 2024-04-21 MED ORDER — BENZTROPINE MESYLATE 1 MG PO TABS: 1.0000 mg | ORAL_TABLET | Freq: Two times a day (BID) | ORAL | 3 refills | Status: DC

## 2024-04-21 NOTE — Progress Notes (Signed)
 BH MD/PA/NP OP Progress Note       04/21/2024 1:54 PM Lucas Knapp  MRN:  [id number]  Chief Complaint: "Things are wonderful, but I have dry mouth ""  HPI: 38 year old male seen today for follow up psychiatric evaluation. He has a psychiatric history of paranoid schizophrenia, bipolar disorder, schizoaffective disorder, and cannabis use disorder. He is currently managed on cogentin  1 mg twice daily, Prolixin  10 mg nightly, Prolixin  5 mg twice daily,  and Trileptal  300 mg twice daily. He informed Clinical research associate that he no longer takes Trileptal .  He reports that his other medications are effective in managing his psychiatric conditions.    Today he is well-groomed, cooperative, and engaged in conversation.  Patient is alert and oriented times 4.  Patient informed writer that his mood is more stable since started Prolixin .  He describes life as being wonderful however notes that he continues to have dry mouth despite discontinuing hydroxyzine  and Trileptal .  Provider asked patient about the fluid intake/water  intake.  Patient notes that he does not drink a lot of water .  Provider encouraged patient to drink at least 8 cups of water  a day.  He notes that he would try.    Since his last visit he informed writer that his anxiety and depression are well-managed.  Provider conducted GAD-7 and patient scored a 3, at his last visit he scored a 3.  Provider also conducted PHQ-9 today is 0, at his last visit he scored a 2.  He endorses adequate sleep and appetite.  Today he denies SI/HI/VAH, mania, paranoia.   Patient informed writer that he continues to have flashbacks and nightmares regarding past trauma.  To cope he notes that listens to [employer] on Youtube and attends therapy.    At this time Trileptal  not restarted.  Patient will continue Cogentin  and she is here Prolixin  as prescribed.   No other concerns noted at this time.   Visit Diagnosis:    ICD-10-CM   1. Paranoid schizophrenia (HCC)   F20.0 fluPHENAZine  (PROLIXIN ) 5 MG tablet    fluPHENAZine  (PROLIXIN ) 10 MG tablet    benztropine  (COGENTIN ) 1 MG tablet            Past Psychiatric History: paranoid schizophrenia, bipolar disorder, schizoaffective disorder, and cannabis use disorder.  Past Medical History:  Past Medical History:  Diagnosis Date   Back pain    Bipolar 1 disorder (HCC)    Bipolar disease, manic (HCC) 06/08/2012   Cannabis use disorder 09/17/2012   Headache 06/08/2012   Hyperlipidemia    Hypertension    pt has been prescribed HCTZ 12.5 mg daily. Pt was 140/80 on admission.    Insomnia 05/02/2014   Long term current use of antipsychotic medication 12/16/2023   Schizoaffective disorder, bipolar type (HCC) 06/08/2012   Schizophrenia (HCC)    Traumatic myalgia 06/08/2012   No past surgical history on file.  Family Psychiatric History: Cousin ADHD, Twin Brother history of schizophrenia and schizoaffective bipolar type  Family History:  Family History  Problem Relation Age of Onset   Hypertension Father    Hyperlipidemia Father    Hypertension Paternal Grandmother    Hypertension Paternal Grandfather    Hypertension Brother        identical twin   Psychosis Brother        "mental issues"      Social History:  Social History   Socioeconomic History   Marital status: Single    Spouse name: Not on file  Number of children: Not on file   Years of education: Not on file   Highest education level: Not on file  Occupational History   Not on file  Tobacco Use   Smoking status: Every Day    Types: Cigarettes   Smokeless tobacco: Never   Tobacco comments:    10-15  Vaping Use   Vaping status: Not on file  Substance and Sexual Activity   Alcohol  use: Yes    Alcohol /week: 4.0 standard drinks of alcohol     Types: 4 Cans of beer per week    Comment: occasional alcohol  use roughly 2x weekly, 1-2 drinks per event   Drug use: Yes    Types: Marijuana    Comment: Pt refused to comment  on drug use   Sexual activity: Not Currently  Other Topics Concern   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    Single   No children   Completed college (bachelors in Statistician)   Enjoys swimming, playing pool, walking   G[place] .  Has identical twin and 2 other brothers in [place]      06/2022: Homeless      07/03/22: Pt says he lives alone and works but doesn't specify his job. Pt denies social support. Has psychiatric services with Dicie Foster, NP.   Social Drivers of Corporate investment banker Strain: Not on file  Food Insecurity: Patient Declined (12/06/2023)   Hunger Vital Sign    Worried About Running Out of Food in the Last Year: Patient declined    Ran Out of Food in the Last Year: Patient declined  Transportation Needs: Patient Declined (12/06/2023)   PRAPARE - Administrator, Civil Service (Medical): Patient declined    Lack of Transportation (Non-Medical): Patient declined  Physical Activity: Not on file  Stress: Not on file  Social Connections: Not on file    Allergies:  Allergies  Allergen Reactions   Geodon  [Ziprasidone  Hcl] Other (See Comments)    Dry throat   Haldol  [Haloperidol ] Swelling and Other (See Comments)    Swollen tongue and abnormal body movements    Metabolic Disorder Labs: Lab Results  Component Value Date   HGBA1C 5.0 12/04/2023   MPG 96.8 12/04/2023   MPG 102.54 07/03/2022   Lab Results  Component Value Date   PROLACTIN 4.7 08/18/2023   PROLACTIN 30.0 (H) 07/29/2018   Lab Results  Component Value Date   CHOL 219 (H) 12/04/2023   TRIG 134 12/04/2023   HDL 50 12/04/2023   CHOLHDL 4.4 12/04/2023   VLDL 27 12/04/2023   LDLCALC 142 (H) 12/04/2023   LDLCALC 147 (H) 08/18/2023   Lab Results  Component Value Date   TSH 1.496 12/04/2023   TSH 2.200 08/18/2023    Therapeutic Level Labs: No  results found for: "LITHIUM" No results found for: "VALPROATE" Lab Results  Component Value Date   CBMZ <0.5 (L) 10/09/2012   CBMZ 2.2 (L) 09/21/2012    Current Medications: Current Outpatient Medications  Medication Sig Dispense Refill   amLODipine  (NORVASC ) 10 MG tablet Take 1 tablet (10 mg total) by mouth at bedtime. 30 tablet 0   benztropine  (COGENTIN ) 1 MG tablet Take 1 tablet (1 mg total) by mouth 2 (two) times daily. 60 tablet 3   fluPHENAZine  (PROLIXIN ) 10 MG tablet Take 1 tablet (10 mg  total) by mouth at bedtime. 30 tablet 3   fluPHENAZine  (PROLIXIN ) 5 MG tablet Take 1 tablet (5 mg total) by mouth 2 (two) times daily. Take one tab at 0800, and one tab at 1400 in addition to the night dose of 10 mg at bedtime 60 tablet 3   No current facility-administered medications for this visit.     Musculoskeletal: Strength & Muscle Tone: within normal limits Gait & Station: normal,  Patient leans: N/A  Psychiatric Specialty Exam: Review of Systems  Blood pressure (!) 128/95, pulse 63, temperature 98.1 F (36.7 C), height 5\' 4"  (1.626 m), weight 197 lb 12.8 oz (89.7 kg), SpO2 99%.Body mass index is 33.95 kg/m.  General Appearance: Well Groomed  Eye Contact:  Good  Speech:  Normal Rate  Volume:  Normal  Mood:  Euthymic  Affect:  Congruent  Thought Process:  Coherent, Goal Directed, and Linear  Orientation:  Full (Time, Place, and Person)  Thought Content: WDL and Logical   Suicidal Thoughts:  No  Homicidal Thoughts:  No  Memory:  Immediate;   Good Recent;   Good Remote;   Good  Judgement:  Good  Insight:  Good  Psychomotor Activity:  Normal  Concentration:  Concentration: Good and Attention Span: Good  Recall:  Good  Fund of Knowledge: Good  Language: Good  Akathisia:  No  Handed:  Right  AIMS (if indicated): not done  Assets:  Communication Skills Desire for Improvement Financial Resources/Insurance Housing Social Support  ADL's:  Intact  Cognition: WNL   Sleep:  Good   Screenings: AIMS    Flowsheet Row Clinical Support from 04/21/2024 in [facility] Ad[facility]Dis[facility]) [facility] Clinical Support from 11/05/2023 in [facility] Clinical Support from 03/10/2023 [facility] Ad[facility] (Discharged[facility]  AIMS Total Score 0 0 4 0 0      AUDIT    Flowsheet Row Admission (Discharged) [facility] Ad[facility] (Discharged[facility] Admission (Discharged[facility]  Alcohol  Use Disorder Identification Test Final Score (AUDIT) 0 0 0      GAD-7    Flowsheet Row Clinical Support from 04/21/2024 in [facility] Clinical Support from 02/02/2024 in [facility] Clinical Support from 11/05/2023 in [facility] Video Visit from 08/25/2023 in [facility] Video Visit from 05/20/2023 in [facility]  Total GAD-7 Score 2 3 2 3 2       PHQ2-9    Flowsheet Row Clinical Support from 04/21/2024 in [facility] Counselor from 04/18/2024 in [facility] Clinical Support from 02/02/2024 in [facility] Clinical Support from 11/05/2023 in [facility] Video Visit from 08/25/2023 in [facility]  PHQ-2 Total Score 0 0 1 1 0  PHQ-9 Total Score 1 0 2 3 0      Flowsheet Row Counselor from 04/18/2024 in [facility] Clinical Support from 02/02/2024 in [facility] Admission (Discharged) from  [facility]  C-SSRS RISK CATEGORY No Risk No Risk No Risk        Assessment and Plan: Patient  reports that he is doing well on his current medication regimen.  He however has discontinued Trileptal  as he notes that he believes it causes dry mouth.  Provider asked patient if he was drinking a lot of water .  He notes that he hardly drinks water .  Provider instructed patient to increase his fluid intake.  He endorsed understanding and agreed.   At this time Trileptal  not restarted.  Patient will continue Prolixin  and cogentin  as prescribed.  1. Paranoid schizophrenia (HCC)  Continue- fluPHENAZine  (PROLIXIN ) 5 MG tablet; Take 1 tablet (5 mg total) by mouth 2 (two) times daily. Take one tab at 0800, and one tab at 1400 in addition to the night dose of 10 mg at bedtime  Dispense: 60 tablet; Refill: 3 Continue- fluPHENAZine  (PROLIXIN ) 10 MG tablet; Take 1 tablet (10 mg total) by mouth at bedtime.  Dispense: 30 tablet; Refill: 3 Continue- benztropine  (COGENTIN ) 1 MG tablet; Take 1 tablet (1 mg total) by mouth 2 (two) times daily.  Dispense: 60 tablet; Refill: 3    Follow up in 2.5 months   Arlyne Bering, NP 04/21/2024, 1:54 PM

## 2024-05-30 ENCOUNTER — Ambulatory Visit (HOSPITAL_COMMUNITY): Admitting: Clinical

## 2024-06-13 ENCOUNTER — Ambulatory Visit (HOSPITAL_COMMUNITY): Admitting: Clinical

## 2024-06-17 ENCOUNTER — Telehealth (HOSPITAL_COMMUNITY): Admitting: Family

## 2024-06-27 ENCOUNTER — Telehealth (HOSPITAL_COMMUNITY): Admitting: Psychiatry

## 2024-06-30 ENCOUNTER — Telehealth (HOSPITAL_COMMUNITY): Admitting: Family

## 2024-08-26 ENCOUNTER — Telehealth (HOSPITAL_COMMUNITY): Payer: Self-pay

## 2024-08-26 NOTE — Telephone Encounter (Signed)
 Patient was last seen on 06/30/24. Patient's pharmacy faxed a refill request for Fluphenazine  5 mg, Fluphenazine  10 mg, Oxcarbazepine  300 mg. There is no note on file to determine if patient is still taking these medications from his last appointment.

## 2024-08-29 ENCOUNTER — Other Ambulatory Visit: Payer: Self-pay | Admitting: Family

## 2024-08-29 DIAGNOSIS — F200 Paranoid schizophrenia: Secondary | ICD-10-CM

## 2024-08-29 MED ORDER — BENZTROPINE MESYLATE 1 MG PO TABS: 1.0000 mg | ORAL_TABLET | Freq: Two times a day (BID) | ORAL | 3 refills | Status: AC

## 2024-08-29 MED ORDER — FLUPHENAZINE HCL 10 MG PO TABS: 10.0000 mg | ORAL_TABLET | Freq: Every day | ORAL | 3 refills | Status: AC

## 2024-08-29 MED ORDER — FLUPHENAZINE HCL 5 MG PO TABS: 5.0000 mg | ORAL_TABLET | Freq: Two times a day (BID) | ORAL | 3 refills | Status: AC

## 2024-11-09 ENCOUNTER — Emergency Department (HOSPITAL_BASED_OUTPATIENT_CLINIC_OR_DEPARTMENT_OTHER): Admitting: Radiology

## 2024-11-09 ENCOUNTER — Emergency Department (HOSPITAL_BASED_OUTPATIENT_CLINIC_OR_DEPARTMENT_OTHER): Admission: EM | Admit: 2024-11-09 | Discharge: 2024-11-09 | Disposition: A | Discharge status: Home / Self Care

## 2024-11-09 ENCOUNTER — Encounter (HOSPITAL_BASED_OUTPATIENT_CLINIC_OR_DEPARTMENT_OTHER): Payer: Self-pay | Admitting: Emergency Medicine

## 2024-11-09 ENCOUNTER — Other Ambulatory Visit: Payer: Self-pay

## 2024-11-09 DIAGNOSIS — Y92410 Unspecified street and highway as the place of occurrence of the external cause: Secondary | ICD-10-CM | POA: Diagnosis not present

## 2024-11-09 DIAGNOSIS — S39012A Strain of muscle, fascia and tendon of lower back, initial encounter: Secondary | ICD-10-CM | POA: Diagnosis not present

## 2024-11-09 DIAGNOSIS — S3992XA Unspecified injury of lower back, initial encounter: Secondary | ICD-10-CM | POA: Diagnosis present

## 2024-11-09 MED ORDER — METHOCARBAMOL 500 MG PO TABS: 500.0000 mg | ORAL_TABLET | Freq: Three times a day (TID) | ORAL | 0 refills | Status: AC | PRN

## 2024-11-09 MED ORDER — CELECOXIB 200 MG PO CAPS: 200.0000 mg | ORAL_CAPSULE | Freq: Two times a day (BID) | ORAL | 0 refills | Status: AC

## 2024-11-09 NOTE — ED Notes (Signed)
 DC paperwork given and verbally understood.
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
